# Patient Record
Sex: Male | Born: 1975 | Race: Black or African American | Hispanic: No | Marital: Single | Smoking: Current every day smoker
Health system: Southern US, Community
[De-identification: ages and names within clinical notes are randomized; demographics above are authoritative.]

## PROBLEM LIST (undated history)

## (undated) DIAGNOSIS — K922 Gastrointestinal hemorrhage, unspecified: Secondary | ICD-10-CM

## (undated) DIAGNOSIS — E119 Type 2 diabetes mellitus without complications: Secondary | ICD-10-CM

## (undated) DIAGNOSIS — I10 Essential (primary) hypertension: Secondary | ICD-10-CM

## (undated) DIAGNOSIS — J45909 Unspecified asthma, uncomplicated: Secondary | ICD-10-CM

## (undated) HISTORY — PX: OTHER SURGICAL HISTORY: SHX169

---

## 2003-09-11 ENCOUNTER — Emergency Department (HOSPITAL_COMMUNITY): Admission: EM | Admit: 2003-09-11 | Discharge: 2003-09-11 | Payer: Self-pay | Admitting: Emergency Medicine

## 2003-09-21 ENCOUNTER — Encounter: Admission: RE | Admit: 2003-09-21 | Discharge: 2003-10-15 | Payer: Self-pay | Admitting: Orthopedic Surgery

## 2004-08-30 ENCOUNTER — Emergency Department: Payer: Self-pay | Admitting: Emergency Medicine

## 2005-03-07 ENCOUNTER — Emergency Department: Payer: Self-pay | Admitting: Emergency Medicine

## 2005-05-19 ENCOUNTER — Emergency Department: Payer: Self-pay | Admitting: Emergency Medicine

## 2005-08-07 ENCOUNTER — Emergency Department: Payer: Self-pay | Admitting: Emergency Medicine

## 2005-08-10 ENCOUNTER — Emergency Department: Payer: Self-pay | Admitting: Unknown Physician Specialty

## 2006-07-19 ENCOUNTER — Emergency Department: Payer: Self-pay

## 2006-07-22 ENCOUNTER — Emergency Department: Payer: Self-pay | Admitting: Emergency Medicine

## 2010-12-08 ENCOUNTER — Emergency Department: Payer: Self-pay | Admitting: Unknown Physician Specialty

## 2010-12-19 ENCOUNTER — Emergency Department: Payer: Self-pay | Admitting: Internal Medicine

## 2011-04-09 ENCOUNTER — Emergency Department: Payer: Self-pay | Admitting: Internal Medicine

## 2011-09-24 ENCOUNTER — Emergency Department: Payer: Self-pay | Admitting: Emergency Medicine

## 2012-08-26 ENCOUNTER — Emergency Department: Payer: Self-pay | Admitting: Emergency Medicine

## 2012-11-14 ENCOUNTER — Emergency Department: Payer: Self-pay | Admitting: Emergency Medicine

## 2013-02-16 ENCOUNTER — Emergency Department: Payer: Self-pay | Admitting: Emergency Medicine

## 2014-02-10 ENCOUNTER — Emergency Department: Payer: Self-pay | Admitting: Emergency Medicine

## 2014-02-10 LAB — URINALYSIS, COMPLETE
BILIRUBIN, UR: NEGATIVE
Bacteria: NONE SEEN
Blood: NEGATIVE
Glucose,UR: NEGATIVE mg/dL (ref 0–75)
LEUKOCYTE ESTERASE: NEGATIVE
NITRITE: NEGATIVE
PROTEIN: NEGATIVE
Ph: 5 (ref 4.5–8.0)
SPECIFIC GRAVITY: 1.024 (ref 1.003–1.030)
WBC UR: 5 /HPF (ref 0–5)

## 2014-02-10 LAB — COMPREHENSIVE METABOLIC PANEL
ALBUMIN: 4.2 g/dL (ref 3.4–5.0)
ANION GAP: 8 (ref 7–16)
Alkaline Phosphatase: 112 U/L
BUN: 8 mg/dL (ref 7–18)
Bilirubin,Total: 0.3 mg/dL (ref 0.2–1.0)
CALCIUM: 8.6 mg/dL (ref 8.5–10.1)
CHLORIDE: 109 mmol/L — AB (ref 98–107)
Co2: 22 mmol/L (ref 21–32)
Creatinine: 1.11 mg/dL (ref 0.60–1.30)
EGFR (African American): >60
EGFR (Non-African Amer.): >60
Glucose: 115 mg/dL — ABNORMAL HIGH (ref 65–99)
Osmolality: 277 (ref 275–301)
Potassium: 3.6 mmol/L (ref 3.5–5.1)
SGOT(AST): 31 U/L (ref 15–37)
SGPT (ALT): 35 U/L (ref 12–78)
SODIUM: 139 mmol/L (ref 136–145)
TOTAL PROTEIN: 7.7 g/dL (ref 6.4–8.2)

## 2014-02-10 LAB — DRUG SCREEN, URINE
Amphetamines, Ur Screen: NEGATIVE (ref ?–1000)
BARBITURATES, UR SCREEN: NEGATIVE (ref ?–200)
BENZODIAZEPINE, UR SCRN: NEGATIVE (ref ?–200)
Cannabinoid 50 Ng, Ur ~~LOC~~: POSITIVE (ref ?–50)
Cocaine Metabolite,Ur ~~LOC~~: NEGATIVE (ref ?–300)
MDMA (ECSTASY) UR SCREEN: NEGATIVE (ref ?–500)
Methadone, Ur Screen: NEGATIVE (ref ?–300)
Opiate, Ur Screen: NEGATIVE (ref ?–300)
PHENCYCLIDINE (PCP) UR S: NEGATIVE (ref ?–25)
Tricyclic, Ur Screen: NEGATIVE (ref ?–1000)

## 2014-02-10 LAB — CBC
HCT: 44.2 % (ref 40.0–52.0)
HGB: 14.5 g/dL (ref 13.0–18.0)
MCH: 28.4 pg (ref 26.0–34.0)
MCHC: 32.8 g/dL (ref 32.0–36.0)
MCV: 87 fL (ref 80–100)
PLATELETS: 264 10*3/uL (ref 150–440)
RBC: 5.11 10*6/uL (ref 4.40–5.90)
RDW: 14.2 % (ref 11.5–14.5)
WBC: 12.1 10*3/uL — ABNORMAL HIGH (ref 3.8–10.6)

## 2014-02-10 LAB — ETHANOL
ETHANOL %: 0.099 % — AB (ref 0.000–0.080)
Ethanol: 99 mg/dL

## 2014-04-12 ENCOUNTER — Emergency Department: Payer: Self-pay | Admitting: Emergency Medicine

## 2014-07-25 ENCOUNTER — Emergency Department: Payer: Self-pay | Admitting: Emergency Medicine

## 2014-07-25 LAB — BASIC METABOLIC PANEL
Anion Gap: 12 (ref 7–16)
BUN: 8 mg/dL (ref 7–18)
CHLORIDE: 108 mmol/L — AB (ref 98–107)
CO2: 23 mmol/L (ref 21–32)
Calcium, Total: 8.8 mg/dL (ref 8.5–10.1)
Creatinine: 1.26 mg/dL (ref 0.60–1.30)
EGFR (African American): >60
Glucose: 76 mg/dL (ref 65–99)
OSMOLALITY: 282 (ref 275–301)
POTASSIUM: 4 mmol/L (ref 3.5–5.1)
SODIUM: 143 mmol/L (ref 136–145)

## 2014-07-25 LAB — CBC WITH DIFFERENTIAL/PLATELET
BASOS ABS: 0.1 10*3/uL (ref 0.0–0.1)
Basophil %: 0.9 %
Eosinophil #: 0.2 10*3/uL (ref 0.0–0.7)
Eosinophil %: 2.8 %
HCT: 47.4 % (ref 40.0–52.0)
HGB: 15.3 g/dL (ref 13.0–18.0)
LYMPHS ABS: 2.1 10*3/uL (ref 1.0–3.6)
LYMPHS PCT: 25.4 %
MCH: 28.2 pg (ref 26.0–34.0)
MCHC: 32.2 g/dL (ref 32.0–36.0)
MCV: 87 fL (ref 80–100)
MONOS PCT: 10.6 %
Monocyte #: 0.9 x10 3/mm (ref 0.2–1.0)
NEUTROS PCT: 60.3 %
Neutrophil #: 4.9 10*3/uL (ref 1.4–6.5)
Platelet: 256 10*3/uL (ref 150–440)
RBC: 5.43 10*6/uL (ref 4.40–5.90)
RDW: 15.2 % — ABNORMAL HIGH (ref 11.5–14.5)
WBC: 8.1 10*3/uL (ref 3.8–10.6)

## 2014-07-25 LAB — TROPONIN I: Troponin-I: <0.02 ng/mL

## 2014-11-10 ENCOUNTER — Emergency Department: Payer: Self-pay | Admitting: Emergency Medicine

## 2014-12-31 ENCOUNTER — Emergency Department: Payer: Self-pay | Admitting: Internal Medicine

## 2015-01-10 ENCOUNTER — Emergency Department: Payer: Self-pay | Admitting: Emergency Medicine

## 2015-01-11 ENCOUNTER — Emergency Department: Payer: Self-pay | Admitting: Emergency Medicine

## 2015-01-21 ENCOUNTER — Emergency Department: Payer: Self-pay | Admitting: Emergency Medicine

## 2015-01-28 ENCOUNTER — Emergency Department: Payer: Self-pay | Admitting: Emergency Medicine

## 2015-02-27 ENCOUNTER — Emergency Department
Admit: 2015-02-27 | Disposition: A | Discharge status: Home / Self Care | Payer: Self-pay | Admitting: Physician Assistant

## 2015-03-19 NOTE — Consult Note (Signed)
Brief Consult Note: Diagnosis: adjustment disorder/alcohol induced mood disorder.   Patient was seen by consultant.   Consult note dictated.   Discussed with Attending MD.   Comments: Psychiatry: PAtient seen. Chart reviewed. While drinking last night he got iddeas of suicide but did not act on it. Now denies any suicidal ideation at all and is calm and lucid. Good insight. Not commitable. Counceled him about effects of alcohol and encouraged locall MH follow up. Can be discharged from ER. Full note dictated.  Electronic Signatures: Helyn Schwan, Madie Reno (MD)  (Signed 18-Mar-15 17:28)  Authored: Brief Consult Note   Last Updated: 18-Mar-15 17:28 by Gonzella Lex (MD)

## 2015-03-19 NOTE — Consult Note (Signed)
PATIENT NAME:  LANGFORD, CARIAS MR#:  [id number] DATE OF BIRTH:  09-29-1976  DATE OF CONSULTATION:  02/10/2014  REFERRING PHYSICIAN:   CONSULTING PHYSICIAN:  Gonzella Lex, MD  IDENTIFYING INFORMATION AND REASON FOR CONSULT: A 39 year old man presented voluntarily to the Emergency Room stating that he was suicidal. Evaluation requested for appropriate psychiatric treatment.   HISTORY OF PRESENT ILLNESS: Information obtained from the patient and the chart. He came into the hospital earlier today, and at that time, he was intoxicated. He tells me that he and his girlfriend had been drinking last night and started to have an argument over something that he cannot even remember now. After the argument had been going on, his mood got worse and he started having thoughts about wanting to die or wanting to kill himself. He did not actually act on it. Does not report psychotic symptoms. Came to the hospital for treatment. He says recently his mood has been a little bit more anxious than usual. He feels like he has some stress in his life because he is getting ready to start college. His relationship with his girlfriend also may be a little bit unsteady. He denies, however, that he had been severely depressed. Denied sleeping or eating problems. Denied any psychotic symptoms. He says that he usually drinks only about 1 day a week at most and usually not as much as he had yesterday. Says that he uses marijuana occasionally but has never found it to be a problem.   PAST PSYCHIATRIC HISTORY: No previous psychiatric treatment. No history of medication treatment. No hospitalization. Denies any history of suicide attempts. No past history of substance abuse treatment.   SUBSTANCE ABUSE HISTORY: The patient claims that his drinking yesterday was atypical for him. Usually, he goes many days without drinking. Denies history of major mood problems related to his alcohol use. Denies any DTs or seizures. Denies any other  drug use besides the marijuana.   FAMILY HISTORY: No known family history of mental illness.   SOCIAL HISTORY: The patient lives with his family of origin. He is not married and has no children. He is involved with a girlfriend. The relationship sounds a little bit rocky but not pathological. He has been working recently but is going to start college. From what I can tell, it sounds like possibly an online school.   MEDICAL HISTORY: The patient is moderately overweight but does not appear to be in particularly bad health. He denies any significant ongoing medical problems. He is not prescribed any medications.    ALLERGIES: No known drug allergies.   REVIEW OF SYSTEMS: Currently says he feels embarrassed about what he expressed yesterday. No longer feels depressed. Denies suicidal ideation. Denies hallucinations. Denies any delusional thinking. Feeling physically better. No longer sick to his stomach. No longer feeling weak or tired. The rest of the full review of systems is negative.   MENTAL STATUS EXAM: Neatly dressed and groomed man, looked his stated age. Cooperative with the interview. Good eye contact. Normal psychomotor activity. No sign of tremor. Speech normal rate, tone and volume. Affect a little bit embarrassed and ashamed but reactive. Mood stated as being okay. Thoughts are lucid without loosening of associations or delusions. Denies auditory or visual hallucinations. Denies suicidal or homicidal ideation. Judgment and insight adequate. Intelligence normal. Short- and long-term memory grossly intact.   LABORATORY RESULTS: Alcohol level was 99. Glucose slightly elevated 115. CBC slightly elevated white count of 12.1. Urinalysis normal. Drug screen positive  for cannabis.   ASSESSMENT: A 39 year old man who presented somewhat intoxicated, having had an argument with his girlfriend, having suicidal thoughts that he did not act on. Now sobered up, he no longer has any suicidal thoughts.  Multiple positive things in his life he is looking forward to. Does not report what sounds like a depression but does seem to probably have some trouble with overreactivity or mood. The patient does not require inpatient hospitalization. He is not committable. The patient has been counseled about the treatment available, including therapy for his emotional instability. He will be encouraged to go to referral at a local outpatient clinic. Otherwise, his commitment can be discontinued and he can be released.   DIAGNOSIS, PRINCIPAL AND PRIMARY:  AXIS I: Adjustment disorder with depression.   SECONDARY DIAGNOSES:  AXIS I: Substance-induced mood disorder, alcohol.  AXIS II: Deferred.  AXIS III: Overweight. No other diagnosis.  AXIS IV: Severe from feeling a lot of stress in his life.  AXIS V: Functioning at time of evaluation 74.    ____________________________ Gonzella Lex, MD jtc:gb D: 02/11/2014 00:19:05 ET T: 02/11/2014 02:53:50 ET JOB#: [id number]  cc: Gonzella Lex, MD, <Dictator> Gonzella Lex MD ELECTRONICALLY SIGNED 02/12/2014 23:42

## 2015-05-07 ENCOUNTER — Encounter: Payer: Self-pay | Admitting: Emergency Medicine

## 2015-05-07 ENCOUNTER — Emergency Department
Admission: EM | Admit: 2015-05-07 | Discharge: 2015-05-07 | Disposition: A | Discharge status: Home / Self Care | Payer: Self-pay | Attending: Emergency Medicine | Admitting: Emergency Medicine

## 2015-05-07 DIAGNOSIS — K0889 Other specified disorders of teeth and supporting structures: Secondary | ICD-10-CM

## 2015-05-07 DIAGNOSIS — Z720 Tobacco use: Secondary | ICD-10-CM | POA: Insufficient documentation

## 2015-05-07 DIAGNOSIS — Z792 Long term (current) use of antibiotics: Secondary | ICD-10-CM | POA: Insufficient documentation

## 2015-05-07 DIAGNOSIS — K029 Dental caries, unspecified: Secondary | ICD-10-CM | POA: Insufficient documentation

## 2015-05-07 HISTORY — DX: Unspecified asthma, uncomplicated: J45.909

## 2015-05-07 MED ORDER — PENICILLIN V POTASSIUM 500 MG PO TABS
500.0000 mg | ORAL_TABLET | Freq: Four times a day (QID) | ORAL | Status: DC
Start: 2015-05-07 — End: 2016-02-06

## 2015-05-07 MED ORDER — LIDOCAINE HCL (PF) 1 % IJ SOLN
INTRAMUSCULAR | Status: AC
  Filled 2015-05-07: qty 5

## 2015-05-07 MED ORDER — TRAMADOL HCL 50 MG PO TABS
ORAL_TABLET | ORAL | Status: AC
  Administered 2015-05-07: 50 mg via ORAL
  Filled 2015-05-07: qty 1

## 2015-05-07 MED ORDER — BUPIVACAINE HCL (PF) 0.5 % IJ SOLN
INTRAMUSCULAR | Status: AC
  Filled 2015-05-07: qty 30

## 2015-05-07 MED ORDER — TRAMADOL HCL 50 MG PO TABS
50.0000 mg | ORAL_TABLET | Freq: Once | ORAL | Status: AC
  Administered 2015-05-07: 50 mg via ORAL

## 2015-05-07 MED ORDER — TRAMADOL HCL 50 MG PO TABS: 50.0000 mg | ORAL_TABLET | Freq: Four times a day (QID) | ORAL | Status: DC | PRN

## 2015-05-07 NOTE — ED Notes (Signed)
Pt. Has chipped lower molar on bottom left side of mouth.  Pt. Just wants medication to control pain until he can see a dentist this week.

## 2015-05-07 NOTE — Discharge Instructions (Signed)
Dental Caries °Dental caries (also called tooth decay) is the most common oral disease. It can occur at any age but is more common in children and young adults.  °HOW DENTAL CARIES DEVELOPS  °The process of decay begins when bacteria and foods (particularly sugars and starches) combine in your mouth to produce plaque. Plaque is a substance that sticks to the hard, outer surface of a tooth (enamel). The bacteria in plaque produce acids that attack enamel. These acids may also attack the root surface of a tooth (cementum) if it is exposed. Repeated attacks dissolve these surfaces and create holes in the tooth (cavities). If left untreated, the acids destroy the other layers of the tooth.  °RISK FACTORS °· Frequent sipping of sugary beverages.   °· Frequent snacking on sugary and starchy foods, especially those that easily get stuck in the teeth.   °· Poor oral hygiene.   °· Dry mouth.   °· Substance abuse such as methamphetamine abuse.   °· Broken or poor-fitting dental restorations.   °· Eating disorders.   °· Gastroesophageal reflux disease (GERD).   °· Certain radiation treatments to the head and neck. °SYMPTOMS °In the early stages of dental caries, symptoms are seldom present. Sometimes white, chalky areas may be seen on the enamel or other tooth layers. In later stages, symptoms may include: °· Pits and holes on the enamel. °· Toothache after sweet, hot, or cold foods or drinks are consumed. °· Pain around the tooth. °· Swelling around the tooth. °DIAGNOSIS  °Most of the time, dental caries is detected during a regular dental checkup. A diagnosis is made after a thorough medical and dental history is taken and the surfaces of your teeth are checked for signs of dental caries. Sometimes special instruments, such as lasers, are used to check for dental caries. Dental X-ray exams may be taken so that areas not visible to the eye (such as between the contact areas of the teeth) can be checked for cavities.    °TREATMENT  °If dental caries is in its early stages, it may be reversed with a fluoride treatment or an application of a remineralizing agent at the dental office. Thorough brushing and flossing at home is needed to aid these treatments. If it is in its later stages, treatment depends on the location and extent of tooth destruction:  °· If a small area of the tooth has been destroyed, the destroyed area will be removed and cavities will be filled with a material such as gold, silver amalgam, or composite resin.   °· If a large area of the tooth has been destroyed, the destroyed area will be removed and a cap (crown) will be fitted over the remaining tooth structure.   °· If the center part of the tooth (pulp) is affected, a procedure called a root canal will be needed before a filling or crown can be placed.   °· If most of the tooth has been destroyed, the tooth may need to be pulled (extracted). °HOME CARE INSTRUCTIONS °You can prevent, stop, or reverse dental caries at home by practicing good oral hygiene. Good oral hygiene includes: °· Thoroughly cleaning your teeth at least twice a day with a toothbrush and dental floss.   °· Using a fluoride toothpaste. A fluoride mouth rinse may also be used if recommended by your dentist or health care provider.   °· Restricting the amount of sugary and starchy foods and sugary liquids you consume.   °· Avoiding frequent snacking on these foods and sipping of these liquids.   °· Keeping regular visits with   a dentist for checkups and cleanings. PREVENTION   Practice good oral hygiene.  Consider a dental sealant. A dental sealant is a coating material that is applied by your dentist to the pits and grooves of teeth. The sealant prevents food from being trapped in them. It may protect the teeth for several years.  Ask about fluoride supplements if you live in a community without fluorinated water or with water that has a low fluoride content. Use fluoride supplements  as directed by your dentist or health care provider.  Allow fluoride varnish applications to teeth if directed by your dentist or health care provider. Document Released: 08/04/2002 Document Revised: 03/29/2014 Document Reviewed: 11/14/2012 [facility] Patient Information 2015 [place], [place]. This information is not intended to replace advice given to you by your health care provider. Make sure you discuss any questions you have with your health care provider.  Dental Pain A tooth ache may be caused by cavities (tooth decay). Cavities expose the nerve of the tooth to air and hot or cold temperatures. It may come from an infection or abscess (also called a boil or furuncle) around your tooth. It is also often caused by dental caries (tooth decay). This causes the pain you are having. DIAGNOSIS  Your caregiver can diagnose this problem by exam. TREATMENT   If caused by an infection, it may be treated with medications which kill germs (antibiotics) and pain medications as prescribed by your caregiver. Take medications as directed.  Only take over-the-counter or prescription medicines for pain, discomfort, or fever as directed by your caregiver.  Whether the tooth ache today is caused by infection or dental disease, you should see your dentist as soon as possible for further care. SEEK MEDICAL CARE IF: The exam and treatment you received today has been provided on an emergency basis only. This is not a substitute for complete medical or dental care. If your problem worsens or new problems (symptoms) appear, and you are unable to meet with your dentist, call or return to this location. SEEK IMMEDIATE MEDICAL CARE IF:   You have a fever.  You develop redness and swelling of your face, jaw, or neck.  You are unable to open your mouth.  You have severe pain uncontrolled by pain medicine. MAKE SURE YOU:   Understand these instructions.  Will watch your condition.  Will get help right away if  you are not doing well or get worse. Document Released: 11/12/2005 Document Revised: 02/04/2012 Document Reviewed: 06/30/2008 [facility] Patient Information 2015 [place], [place]. This information is not intended to replace advice given to you by your health care provider. Make sure you discuss any questions you have with your health care provider.

## 2015-05-07 NOTE — ED Provider Notes (Signed)
CSN: [id number]     Arrival date & time 05/07/15  1905 History   First MD Initiated Contact with Patient 05/07/15 1952     Chief Complaint  Patient presents with  . Dental Pain    Pt. states toothach for the past month, worse in the last 24 hours.  Pt. states starting amoxician about a month ago but did not finish taking medication.  Pt. states ibuprohen not controlling pain.  Dental pain in located on bottom right side. Pt. states "I just want some stronger medication"     (Consider location/radiation/quality/duration/timing/severity/associated sxs/prior Treatment) HPI  39 year old male presents to the emergency department for evaluation of toothache pain has been present for 2 days. No trauma or injury. He has a history of dental caries. Dental pain is at tooth #20. Patient has tried amoxicillin and ibuprofen with no relief. Pain is currently moderate to severe. Pain is described as sharp No fevers. He is able tolerate by mouth well.  Past Medical History  Diagnosis Date  . Asthma    No past surgical history on file. Family History  Problem Relation Age of Onset  . Diabetes Mother   . Cancer Father    History  Substance Use Topics  . Smoking status: Current Every Day Smoker -- 1.00 packs/day for 5 years    Types: Cigarettes  . Smokeless tobacco: Not on file  . Alcohol Use: 4.2 oz/week    5 Shots of liquor, 2 Cans of beer per week    Review of Systems  Constitutional: Negative.  Negative for fever and chills.  HENT: Positive for dental problem. Negative for drooling, facial swelling, mouth sores, trouble swallowing and voice change.   Respiratory: Negative for chest tightness and shortness of breath.   Cardiovascular: Negative for chest pain.  Gastrointestinal: Negative for nausea, vomiting, abdominal pain and diarrhea.  Musculoskeletal: Negative for arthralgias, neck pain and neck stiffness.  Skin: Negative.   Psychiatric/Behavioral: Negative for confusion.  All other  systems reviewed and are negative.     Allergies  Review of patient's allergies indicates not on file.  Home Medications   Prior to Admission medications   Medication Sig Start Date End Date Taking? Authorizing Provider  amoxicillin (AMOXIL) 500 MG capsule Take 500 mg by mouth 3 (three) times daily.   Yes Historical Provider, MD  penicillin v potassium (VEETID) 500 MG tablet Take 1 tablet (500 mg total) by mouth 4 (four) times daily. 05/07/15   Duanne Guess, PA-C  traMADol (ULTRAM) 50 MG tablet Take 1 tablet (50 mg total) by mouth every 6 (six) hours as needed. 05/07/15   Duanne Guess, PA-C   BP 126/74 mmHg  Pulse 101  Temp(Src) 98.9 F (37.2 C)  Ht 5\' 9"  (1.753 m)  Wt 270 lb (122.471 kg)  BMI 39.85 kg/m2  SpO2 96% Physical Exam  Constitutional: He is oriented to person, place, and time. He appears well-developed and well-nourished. No distress.  HENT:  Head: Normocephalic and atraumatic.  Right Ear: External ear normal.  Left Ear: External ear normal.  Nose: Nose normal.  Mouth/Throat: Uvula is midline and oropharynx is clear and moist. No oral lesions. No trismus in the jaw. Normal dentition. Dental caries present. No dental abscesses or uvula swelling.    Eyes: EOM are normal.  Neck: Normal range of motion. Neck supple.  Cardiovascular: Normal rate.   Pulmonary/Chest: Effort normal. No respiratory distress.  Neurological: He is alert and oriented to person, place, and time.  Skin:  Skin is warm and dry.  Psychiatric: He has a normal mood and affect. His behavior is normal. Thought content normal.    ED Course  Procedures (including critical care time) Left mandibular nerve block: Patient was offered, agreed and consented to a left mandibular nerve block for left lower tooth pain. A 10 cc syringe was filled with 5 cc of 1% lidocaine,5 cc of 0.5% bupivacaine. Solution was injected as a bolus solution around the left mandibular nerve. Patient had complete pain relief  after the injection.   Labs Review Labs Reviewed - No data to display  Imaging Review No results found.   EKG Interpretation None      MDM   Final diagnoses:  Pain, dental  Dental caries    1. Follow-up with dentist or [facility] dental clinic 2. Penicillin VK 3. Tramadol and ibuprofen 4. Left mandibular nerve block 5. Return to the ER for any worsening symptoms or changes    Duanne Guess, PA-C 05/07/15 2016  Hinda Kehr, MD 05/08/15 [phone]

## 2015-05-08 ENCOUNTER — Emergency Department (HOSPITAL_COMMUNITY)
Admission: EM | Admit: 2015-05-08 | Discharge: 2015-05-08 | Disposition: A | Discharge status: Home / Self Care | Payer: Self-pay | Attending: Emergency Medicine | Admitting: Emergency Medicine

## 2015-05-08 ENCOUNTER — Encounter (HOSPITAL_COMMUNITY): Payer: Self-pay | Admitting: Emergency Medicine

## 2015-05-08 DIAGNOSIS — Z792 Long term (current) use of antibiotics: Secondary | ICD-10-CM | POA: Insufficient documentation

## 2015-05-08 DIAGNOSIS — K029 Dental caries, unspecified: Secondary | ICD-10-CM | POA: Insufficient documentation

## 2015-05-08 DIAGNOSIS — Z720 Tobacco use: Secondary | ICD-10-CM | POA: Insufficient documentation

## 2015-05-08 DIAGNOSIS — J45909 Unspecified asthma, uncomplicated: Secondary | ICD-10-CM | POA: Insufficient documentation

## 2015-05-08 MED ORDER — HYDROCODONE-ACETAMINOPHEN 5-325 MG PO TABS
1.0000 | ORAL_TABLET | Freq: Once | ORAL | Status: AC
  Administered 2015-05-08: 1 via ORAL
  Filled 2015-05-08: qty 1

## 2015-05-08 MED ORDER — TRAMADOL HCL 50 MG PO TABS: 50.0000 mg | ORAL_TABLET | Freq: Once | ORAL | Status: DC

## 2015-05-08 NOTE — ED Notes (Signed)
Patient here with complaint of dental pain on lower left side. Was seen at [facility] with dental block placed. States that he was told the block would last 12-16 hours, but the pain has returned and is worse at this time.

## 2015-05-08 NOTE — ED Provider Notes (Signed)
CSN: [id number]     Arrival date & time 05/08/15  0022 History  This chart was scribed for non-physician practitioner, Garald Balding, NP, working with Francine Graven, DO, by Peyton Bottoms ED Scribe. This patient was seen in room A06C/A06C and the patient's care was started at 12:39 AM    Chief Complaint  Patient presents with  . Dental Pain   Patient is a 39 y.o. male presenting with tooth pain. The history is provided by the patient. No language interpreter was used.  Dental Pain Location:  Lower Lower teeth location:  31/RL 2nd molar Quality:  Pulsating Severity:  Moderate Onset quality:  Unable to specify Timing:  Constant Progression:  Unchanged Chronicity:  New Context: dental caries   Relieved by:  Dental block Ineffective treatments:  None tried Associated symptoms: no facial swelling, no fever, no neck pain and no trismus     HPI Comments: Logan Lucas is a 39 y.o. male who presents to the Emergency Department complaining of gradually worsening dental pain onset yesterday. Patient was seen at 19:52 yesterday at [facility] and was placed left mandibular nerve block for left lower dental pain. Patient was given tramadol yesterday. He has not filled his prescription for his tramadol medication.  Past Medical History  Diagnosis Date  . Asthma    History reviewed. No pertinent past surgical history. Family History  Problem Relation Age of Onset  . Diabetes Mother   . Cancer Father    History  Substance Use Topics  . Smoking status: Current Every Day Smoker -- 1.00 packs/day for 5 years    Types: Cigarettes  . Smokeless tobacco: Not on file  . Alcohol Use: 4.2 oz/week    5 Shots of liquor, 2 Cans of beer per week   Review of Systems  Constitutional: Negative for fever.  HENT: Positive for dental problem. Negative for facial swelling and trouble swallowing.   Gastrointestinal: Negative for nausea.  Musculoskeletal: Negative for neck pain.  All other  systems reviewed and are negative.  Allergies  Review of patient's allergies indicates no known allergies.  Home Medications   Prior to Admission medications   Medication Sig Start Date End Date Taking? Authorizing Provider  amoxicillin (AMOXIL) 500 MG capsule Take 500 mg by mouth 3 (three) times daily.    Historical Provider, MD  penicillin v potassium (VEETID) 500 MG tablet Take 1 tablet (500 mg total) by mouth 4 (four) times daily. 05/07/15   Duanne Guess, PA-C  traMADol (ULTRAM) 50 MG tablet Take 1 tablet (50 mg total) by mouth every 6 (six) hours as needed. 05/07/15   Duanne Guess, PA-C   Triage Vitals: BP 144/79 mmHg  Pulse 109  Temp(Src) 98 F (36.7 C) (Oral)  Resp 16  Ht 5\' 8"  (1.727 m)  Wt 260 lb (117.935 kg)  BMI 39.54 kg/m2  SpO2 96%  Physical Exam  Constitutional: He is oriented to person, place, and time. He appears well-developed and well-nourished. No distress.  HENT:  Head: Normocephalic and atraumatic.  Eyes: Conjunctivae and EOM are normal.  Neck: Neck supple. No tracheal deviation present.  Cardiovascular: Normal rate.   Pulmonary/Chest: Effort normal. No respiratory distress.  Musculoskeletal: Normal range of motion.  Neurological: He is alert and oriented to person, place, and time.  Skin: Skin is warm and dry.  Psychiatric: He has a normal mood and affect. His behavior is normal.  Nursing note and vitals reviewed.   ED Course  Procedures (including critical  care time)  DIAGNOSTIC STUDIES: Oxygen Saturation is 96% on RA, normal by my interpretation.    COORDINATION OF CARE: 12:43 AM- Discussed plans to give patient tramadol. Will discharge patient. Pt advised of plan for treatment and pt agrees.   Labs Review Labs Reviewed - No data to display  Imaging Review No results found.   EKG Interpretation None    will give Hydrocodone in the ED suggest fill the Tramadol and gave DDS referral to call on Monday AM  MDM   Final diagnoses:   None    I personally performed the services described in this documentation, which was scribed in my presence. The recorded information has been reviewed and is accurate.  Junius Creamer, NP 05/08/15 [place], DO 05/11/15 1430

## 2015-05-08 NOTE — ED Notes (Signed)
Pt. Left with all belongings and refused wheelchair 

## 2015-05-08 NOTE — Discharge Instructions (Signed)
Dental Care and Dentist Visits Dental care supports good overall health. Regular dental visits can also help you avoid dental pain, bleeding, infection, and other more serious health problems in the future. It is important to keep the mouth healthy because diseases in the teeth, gums, and other oral tissues can spread to other areas of the body. Some problems, such as diabetes, heart disease, and pre-term labor have been associated with poor oral health.  See your dentist every 6 months. If you experience emergency problems such as a toothache or broken tooth, go to the dentist right away. If you see your dentist regularly, you may catch problems early. It is easier to be treated for problems in the early stages.  WHAT TO EXPECT AT A DENTIST VISIT  Your dentist will look for many common oral health problems and recommend proper treatment. At your regular dental visit, you can expect:  Gentle cleaning of the teeth and gums. This includes scraping and polishing. This helps to remove the sticky substance around the teeth and gums (plaque). Plaque forms in the mouth shortly after eating. Over time, plaque hardens on the teeth as tartar. If tartar is not removed regularly, it can cause problems. Cleaning also helps remove stains.  Periodic X-rays. These pictures of the teeth and supporting bone will help your dentist assess the health of your teeth.  Periodic fluoride treatments. Fluoride is a natural mineral shown to help strengthen teeth. Fluoride treatmentinvolves applying a fluoride gel or varnish to the teeth. It is most commonly done in children.  Examination of the mouth, tongue, jaws, teeth, and gums to look for any oral health problems, such as:  Cavities (dental caries). This is decay on the tooth caused by plaque, sugar, and acid in the mouth. It is best to catch a cavity when it is small.  Inflammation of the gums caused by plaque buildup (gingivitis).  Problems with the mouth or malformed  or misaligned teeth.  Oral cancer or other diseases of the soft tissues or jaws. KEEP YOUR TEETH AND GUMS HEALTHY For healthy teeth and gums, follow these general guidelines as well as your dentist's specific advice:  Have your teeth professionally cleaned at the dentist every 6 months.  Brush twice daily with a fluoride toothpaste.  Floss your teeth daily.  Ask your dentist if you need fluoride supplements, treatments, or fluoride toothpaste.  Eat a healthy diet. Reduce foods and drinks with added sugar.  Avoid smoking. TREATMENT FOR ORAL HEALTH PROBLEMS If you have oral health problems, treatment varies depending on the conditions present in your teeth and gums.  Your caregiver will most likely recommend good oral hygiene at each visit.  For cavities, gingivitis, or other oral health disease, your caregiver will perform a procedure to treat the problem. This is typically done at a separate appointment. Sometimes your caregiver will refer you to another dental specialist for specific tooth problems or for surgery. SEEK IMMEDIATE DENTAL CARE IF:  You have pain, bleeding, or soreness in the gum, tooth, jaw, or mouth area.  A permanent tooth becomes loose or separated from the gum socket.  You experience a blow or injury to the mouth or jaw area. Document Released: 07/25/2011 Document Revised: 02/04/2012 Document Reviewed: 07/25/2011 [facility] Patient Information 2015 [place], [place]. This information is not intended to replace advice given to you by your health care provider. Make sure you discuss any questions you have with your health care provider.  Dental Caries Dental caries (also called tooth decay)  is the most common oral disease. It can occur at any age but is more common in children and young adults.  HOW DENTAL CARIES DEVELOPS  The process of decay begins when bacteria and foods (particularly sugars and starches) combine in your mouth to produce plaque. Plaque is a  substance that sticks to the hard, outer surface of a tooth (enamel). The bacteria in plaque produce acids that attack enamel. These acids may also attack the root surface of a tooth (cementum) if it is exposed. Repeated attacks dissolve these surfaces and create holes in the tooth (cavities). If left untreated, the acids destroy the other layers of the tooth.  RISK FACTORS  Frequent sipping of sugary beverages.   Frequent snacking on sugary and starchy foods, especially those that easily get stuck in the teeth.   Poor oral hygiene.   Dry mouth.   Substance abuse such as methamphetamine abuse.   Broken or poor-fitting dental restorations.   Eating disorders.   Gastroesophageal reflux disease (GERD).   Certain radiation treatments to the head and neck. SYMPTOMS In the early stages of dental caries, symptoms are seldom present. Sometimes white, chalky areas may be seen on the enamel or other tooth layers. In later stages, symptoms may include:  Pits and holes on the enamel.  Toothache after sweet, hot, or cold foods or drinks are consumed.  Pain around the tooth.  Swelling around the tooth. DIAGNOSIS  Most of the time, dental caries is detected during a regular dental checkup. A diagnosis is made after a thorough medical and dental history is taken and the surfaces of your teeth are checked for signs of dental caries. Sometimes special instruments, such as lasers, are used to check for dental caries. Dental X-ray exams may be taken so that areas not visible to the eye (such as between the contact areas of the teeth) can be checked for cavities.  TREATMENT  If dental caries is in its early stages, it may be reversed with a fluoride treatment or an application of a remineralizing agent at the dental office. Thorough brushing and flossing at home is needed to aid these treatments. If it is in its later stages, treatment depends on the location and extent of tooth destruction:    If a small area of the tooth has been destroyed, the destroyed area will be removed and cavities will be filled with a material such as gold, silver amalgam, or composite resin.   If a large area of the tooth has been destroyed, the destroyed area will be removed and a cap (crown) will be fitted over the remaining tooth structure.   If the center part of the tooth (pulp) is affected, a procedure called a root canal will be needed before a filling or crown can be placed.   If most of the tooth has been destroyed, the tooth may need to be pulled (extracted). HOME CARE INSTRUCTIONS You can prevent, stop, or reverse dental caries at home by practicing good oral hygiene. Good oral hygiene includes:  Thoroughly cleaning your teeth at least twice a day with a toothbrush and dental floss.   Using a fluoride toothpaste. A fluoride mouth rinse may also be used if recommended by your dentist or health care provider.   Restricting the amount of sugary and starchy foods and sugary liquids you consume.   Avoiding frequent snacking on these foods and sipping of these liquids.   Keeping regular visits with a dentist for checkups and cleanings. PREVENTION  Practice good oral hygiene.  Consider a dental sealant. A dental sealant is a coating material that is applied by your dentist to the pits and grooves of teeth. The sealant prevents food from being trapped in them. It may protect the teeth for several years.  Ask about fluoride supplements if you live in a community without fluorinated water or with water that has a low fluoride content. Use fluoride supplements as directed by your dentist or health care provider.  Allow fluoride varnish applications to teeth if directed by your dentist or health care provider. Document Released: 08/04/2002 Document Revised: 03/29/2014 Document Reviewed: 11/14/2012 [facility] Patient Information 2015 [place], [place]. This information is not intended to  replace advice given to you by your health care provider. Make sure you discuss any questions you have with your health care provider. Please take the Penicillin as directed fill the Ultram prescription  Call Dr. Radford Pax in the morning to set an appointment to be seen in the next several days

## 2015-12-12 ENCOUNTER — Emergency Department
Admission: EM | Admit: 2015-12-12 | Discharge: 2015-12-12 | Disposition: A | Discharge status: Home / Self Care | Payer: Self-pay | Attending: Emergency Medicine | Admitting: Emergency Medicine

## 2015-12-12 ENCOUNTER — Encounter: Payer: Self-pay | Admitting: Emergency Medicine

## 2015-12-12 ENCOUNTER — Emergency Department: Payer: Self-pay

## 2015-12-12 DIAGNOSIS — Y9289 Other specified places as the place of occurrence of the external cause: Secondary | ICD-10-CM | POA: Insufficient documentation

## 2015-12-12 DIAGNOSIS — S300XXA Contusion of lower back and pelvis, initial encounter: Secondary | ICD-10-CM | POA: Insufficient documentation

## 2015-12-12 DIAGNOSIS — F17210 Nicotine dependence, cigarettes, uncomplicated: Secondary | ICD-10-CM | POA: Insufficient documentation

## 2015-12-12 DIAGNOSIS — Y998 Other external cause status: Secondary | ICD-10-CM | POA: Insufficient documentation

## 2015-12-12 DIAGNOSIS — Y9389 Activity, other specified: Secondary | ICD-10-CM | POA: Insufficient documentation

## 2015-12-12 DIAGNOSIS — Z792 Long term (current) use of antibiotics: Secondary | ICD-10-CM | POA: Insufficient documentation

## 2015-12-12 MED ORDER — HYDROCODONE-ACETAMINOPHEN 5-325 MG PO TABS: 1.0000 | ORAL_TABLET | ORAL | Status: DC | PRN

## 2015-12-12 MED ORDER — IBUPROFEN 800 MG PO TABS: 800.0000 mg | ORAL_TABLET | Freq: Three times a day (TID) | ORAL | Status: DC | PRN

## 2015-12-12 MED ORDER — KETOROLAC TROMETHAMINE 60 MG/2ML IM SOLN
60.0000 mg | Freq: Once | INTRAMUSCULAR | Status: AC
  Administered 2015-12-12: 60 mg via INTRAMUSCULAR
  Filled 2015-12-12: qty 2
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
## 2015-12-12 NOTE — Discharge Instructions (Signed)
Tailbone Injury °The tailbone (coccyx) is the small bone at the lower end of the spine. A tailbone injury may involve stretched ligaments, bruising, or a broken bone (fracture). Tailbone injuries can be painful, and some may take a long time to heal. °CAUSES °This condition is often caused by falling and landing on the tailbone. Other causes include: °· Repeated strain or friction from actions such as rowing and bicycling. °· Childbirth. °In some cases, the cause may not be known. °RISK FACTORS °This condition is more common in women than in men. °SYMPTOMS °Symptoms of this condition include: °· Pain in the lower back, especially when sitting. °· Pain or difficulty when standing up from a sitting position. °· Bruising in the tailbone area. °· Painful bowel movements. °· In women, pain during intercourse. °DIAGNOSIS °This condition may be diagnosed based on your symptoms and a physical exam. X-rays may be taken if a fracture is suspected. You may also have other tests, such as a CT scan or MRI. °TREATMENT °This condition may be treated with medicines to help relieve your pain. Most tailbone injuries heal on their own in 4-6 weeks. However, recovery time may be longer if the injury involves a fracture. °HOME CARE INSTRUCTIONS °· Take medicines only as directed by your health care provider. °· If directed, apply ice to the injured area: °¨ Put ice in a plastic bag. °¨ Place a towel between your skin and the bag. °¨ Leave the ice on for 20 minutes, 2-3 times per day for the first 1-2 days. °· Sit on a large, rubber or inflated ring or cushion to ease your pain. Lean forward when you are sitting to help decrease discomfort. °· Avoid sitting for long periods of time. °· Increase your activity as the pain allows. Perform any exercises that are recommended by your health care provider or physical therapist. °· If you have pain during bowel movements, use stool softeners as directed by your health care provider. °· Eat a  diet that includes plenty of fiber to help prevent constipation. °· Keep all follow-up visits as directed by your health care provider. This is important. °PREVENTION °Wear appropriate padding and sports gear when bicycling and rowing. This can help to prevent developing an injury that is caused by repeated strain or friction. °SEEK MEDICAL CARE IF: °· Your pain becomes worse. °· Your bowel movements cause a great deal of discomfort. °· You are unable to have a bowel movement. °· You have uncontrolled urine loss (urinary incontinence). °· You have a fever. °  °This information is not intended to replace advice given to you by your health care provider. Make sure you discuss any questions you have with your health care provider. °  °Document Released: 11/09/2000 Document Revised: 03/29/2015 Document Reviewed: 11/08/2014 °Elsevier Interactive Patient Education ©2016 [employer] ° °
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
## 2015-12-16 ENCOUNTER — Emergency Department
Admission: EM | Admit: 2015-12-16 | Discharge: 2015-12-16 | Disposition: A | Discharge status: Home / Self Care | Payer: Self-pay | Attending: Emergency Medicine | Admitting: Emergency Medicine

## 2015-12-16 DIAGNOSIS — M62838 Other muscle spasm: Secondary | ICD-10-CM

## 2015-12-16 DIAGNOSIS — Y9389 Activity, other specified: Secondary | ICD-10-CM | POA: Insufficient documentation

## 2015-12-16 DIAGNOSIS — S79912A Unspecified injury of left hip, initial encounter: Secondary | ICD-10-CM | POA: Insufficient documentation

## 2015-12-16 DIAGNOSIS — Y998 Other external cause status: Secondary | ICD-10-CM | POA: Insufficient documentation

## 2015-12-16 DIAGNOSIS — M62830 Muscle spasm of back: Secondary | ICD-10-CM | POA: Insufficient documentation

## 2015-12-16 DIAGNOSIS — Y92481 Parking lot as the place of occurrence of the external cause: Secondary | ICD-10-CM | POA: Insufficient documentation

## 2015-12-16 DIAGNOSIS — F17210 Nicotine dependence, cigarettes, uncomplicated: Secondary | ICD-10-CM | POA: Insufficient documentation

## 2015-12-16 DIAGNOSIS — S79922A Unspecified injury of left thigh, initial encounter: Secondary | ICD-10-CM | POA: Insufficient documentation

## 2015-12-16 DIAGNOSIS — Z792 Long term (current) use of antibiotics: Secondary | ICD-10-CM | POA: Insufficient documentation

## 2015-12-16 DIAGNOSIS — S300XXA Contusion of lower back and pelvis, initial encounter: Secondary | ICD-10-CM | POA: Insufficient documentation

## 2015-12-16 MED ORDER — DIAZEPAM 5 MG PO TABS
5.0000 mg | ORAL_TABLET | Freq: Once | ORAL | Status: AC
  Administered 2015-12-16: 5 mg via ORAL
  Filled 2015-12-16: qty 1

## 2015-12-16 MED ORDER — KETOROLAC TROMETHAMINE 60 MG/2ML IM SOLN
60.0000 mg | Freq: Once | INTRAMUSCULAR | Status: AC
  Administered 2015-12-16: 60 mg via INTRAMUSCULAR
  Filled 2015-12-16: qty 2

## 2015-12-16 MED ORDER — DOCUSATE SODIUM 100 MG PO CAPS: 100.0000 mg | ORAL_CAPSULE | Freq: Every day | ORAL | Status: AC | PRN

## 2015-12-16 MED ORDER — CARISOPRODOL 350 MG PO TABS: 350.0000 mg | ORAL_TABLET | Freq: Three times a day (TID) | ORAL | Status: DC | PRN

## 2015-12-16 MED ORDER — DIAZEPAM 5 MG PO TABS
ORAL_TABLET | ORAL | Status: AC
  Filled 2015-12-16: qty 1
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
## 2015-12-16 NOTE — ED Notes (Signed)
Lower back pain since fall. Pt alert and oriented X4, active, cooperative, pt in NAD. RR even and unlabored, color WNL.

## 2015-12-16 NOTE — ED Notes (Signed)
Pt alert and oriented X4, active, cooperative, pt in NAD. RR even and unlabored, color WNL.  Pt informed to return if any life threatening symptoms occur.   
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
## 2015-12-16 NOTE — ED Notes (Signed)
Report from Elizabeth, RN

## 2016-02-06 ENCOUNTER — Encounter: Payer: Self-pay | Admitting: Emergency Medicine

## 2016-02-06 ENCOUNTER — Emergency Department
Admission: EM | Admit: 2016-02-06 | Discharge: 2016-02-06 | Disposition: A | Discharge status: Home / Self Care | Payer: Self-pay | Attending: Emergency Medicine | Admitting: Emergency Medicine

## 2016-02-06 DIAGNOSIS — R109 Unspecified abdominal pain: Secondary | ICD-10-CM | POA: Insufficient documentation

## 2016-02-06 DIAGNOSIS — F419 Anxiety disorder, unspecified: Secondary | ICD-10-CM | POA: Insufficient documentation

## 2016-02-06 DIAGNOSIS — F17210 Nicotine dependence, cigarettes, uncomplicated: Secondary | ICD-10-CM | POA: Insufficient documentation

## 2016-02-06 DIAGNOSIS — J45901 Unspecified asthma with (acute) exacerbation: Secondary | ICD-10-CM | POA: Insufficient documentation

## 2016-02-06 LAB — COMPREHENSIVE METABOLIC PANEL
ALT: 39 U/L (ref 17–63)
AST: 44 U/L — ABNORMAL HIGH (ref 15–41)
Albumin: 4.1 g/dL (ref 3.5–5.0)
Alkaline Phosphatase: 105 U/L (ref 38–126)
Anion gap: 12 (ref 5–15)
BILIRUBIN TOTAL: 1.1 mg/dL (ref 0.3–1.2)
BUN: 9 mg/dL (ref 6–20)
CHLORIDE: 101 mmol/L (ref 101–111)
CO2: 21 mmol/L — ABNORMAL LOW (ref 22–32)
Calcium: 9.3 mg/dL (ref 8.9–10.3)
Creatinine, Ser: 0.91 mg/dL (ref 0.61–1.24)
Glucose, Bld: 126 mg/dL — ABNORMAL HIGH (ref 65–99)
POTASSIUM: 4 mmol/L (ref 3.5–5.1)
Sodium: 134 mmol/L — ABNORMAL LOW (ref 135–145)
TOTAL PROTEIN: 7.4 g/dL (ref 6.5–8.1)

## 2016-02-06 LAB — CBC
HEMATOCRIT: 50.1 % (ref 40.0–52.0)
Hemoglobin: 16.9 g/dL (ref 13.0–18.0)
MCH: 28.3 pg (ref 26.0–34.0)
MCHC: 33.7 g/dL (ref 32.0–36.0)
MCV: 83.9 fL (ref 80.0–100.0)
PLATELETS: 193 10*3/uL (ref 150–440)
RBC: 5.97 MIL/uL — ABNORMAL HIGH (ref 4.40–5.90)
RDW: 14.5 % (ref 11.5–14.5)
WBC: 5 10*3/uL (ref 3.8–10.6)

## 2016-02-06 LAB — LIPASE, BLOOD: Lipase: 28 U/L (ref 11–51)

## 2016-02-06 MED ORDER — SODIUM CHLORIDE 0.9 % IV SOLN
1000.0000 mL | Freq: Once | INTRAVENOUS | Status: AC
  Administered 2016-02-06: 1000 mL via INTRAVENOUS

## 2016-02-06 MED ORDER — ALBUTEROL SULFATE HFA 108 (90 BASE) MCG/ACT IN AERS: 2.0000 | INHALATION_SPRAY | Freq: Four times a day (QID) | RESPIRATORY_TRACT | Status: DC | PRN

## 2016-02-06 MED ORDER — IPRATROPIUM-ALBUTEROL 0.5-2.5 (3) MG/3ML IN SOLN
3.0000 mL | Freq: Once | RESPIRATORY_TRACT | Status: AC
  Administered 2016-02-06: 3 mL via RESPIRATORY_TRACT
  Filled 2016-02-06: qty 3

## 2016-02-06 MED ORDER — PREDNISONE 50 MG PO TABS: 50.0000 mg | ORAL_TABLET | Freq: Every day | ORAL | Status: DC

## 2016-02-06 MED ORDER — PREDNISONE 20 MG PO TABS
60.0000 mg | ORAL_TABLET | Freq: Once | ORAL | Status: AC
  Administered 2016-02-06: 60 mg via ORAL
  Filled 2016-02-06: qty 3

## 2016-02-06 NOTE — ED Notes (Signed)
Pt presents with flu like sx and dizziness since last week.

## 2016-02-06 NOTE — Discharge Instructions (Signed)
Asthma, Acute Bronchospasm °Acute bronchospasm caused by asthma is also referred to as an asthma attack. Bronchospasm means your air passages become narrowed. The narrowing is caused by inflammation and tightening of the muscles in the air tubes (bronchi) in your lungs. This can make it hard to breathe or cause you to wheeze and cough. °CAUSES °Possible triggers are: °· Animal dander from the skin, hair, or feathers of animals. °· Dust mites contained in house dust. °· Cockroaches. °· Pollen from trees or grass. °· Mold. °· Cigarette or tobacco smoke. °· Air pollutants such as dust, household cleaners, hair sprays, aerosol sprays, paint fumes, strong chemicals, or strong odors. °· Cold air or weather changes. Cold air may trigger inflammation. Winds increase molds and pollens in the air. °· Strong emotions such as crying or laughing hard. °· Stress. °· Certain medicines such as aspirin or beta-blockers. °· Sulfites in foods and drinks, such as dried fruits and wine. °· Infections or inflammatory conditions, such as a flu, cold, or inflammation of the nasal membranes (rhinitis). °· Gastroesophageal reflux disease (GERD). GERD is a condition where stomach acid backs up into your esophagus. °· Exercise or strenuous activity. °SIGNS AND SYMPTOMS  °· Wheezing. °· Excessive coughing, particularly at night. °· Chest tightness. °· Shortness of breath. °DIAGNOSIS  °Your health care provider will ask you about your medical history and perform a physical exam. A chest X-ray or blood testing may be performed to look for other causes of your symptoms or other conditions that may have triggered your asthma attack.  °TREATMENT  °Treatment is aimed at reducing inflammation and opening up the airways in your lungs.  Most asthma attacks are treated with inhaled medicines. These include quick relief or rescue medicines (such as bronchodilators) and controller medicines (such as inhaled corticosteroids). These medicines are sometimes  given through an inhaler or a nebulizer. Systemic steroid medicine taken by mouth or given through an IV tube also can be used to reduce the inflammation when an attack is moderate or severe. Antibiotic medicines are only used if a bacterial infection is present.  °HOME CARE INSTRUCTIONS  °· Rest. °· Drink plenty of liquids. This helps the mucus to remain thin and be easily coughed up. Only use caffeine in moderation and do not use alcohol until you have recovered from your illness. °· Do not smoke. Avoid being exposed to secondhand smoke. °· You play a critical role in keeping yourself in good health. Avoid exposure to things that cause you to wheeze or to have breathing problems. °· Keep your medicines up-to-date and available. Carefully follow your health care provider's treatment plan. °· Take your medicine exactly as prescribed. °· When pollen or pollution is bad, keep windows closed and use an air conditioner or go to places with air conditioning. °· Asthma requires careful medical care. See your health care provider for a follow-up as advised. If you are more than [redacted] weeks pregnant and you were prescribed any new medicines, let your obstetrician know about the visit and how you are doing. Follow up with your health care provider as directed. °· After you have recovered from your asthma attack, make an appointment with your outpatient doctor to talk about ways to reduce the likelihood of future attacks. If you do not have a doctor who manages your asthma, make an appointment with a primary care doctor to discuss your asthma. °SEEK IMMEDIATE MEDICAL CARE IF:  °· You are getting worse. °· You have trouble breathing. If severe, call your local   emergency services (911 in the [place]  You develop chest pain or discomfort.  You are vomiting.  You are not able to keep fluids down.  You are coughing up yellow, green, brown, or bloody sputum.  You have a fever and your symptoms suddenly get worse.  You have  trouble swallowing. MAKE SURE YOU:   Understand these instructions.  Will watch your condition.  Will get help right away if you are not doing well or get worse.   This information is not intended to replace advice given to you by your health care provider. Make sure you discuss any questions you have with your health care provider.   Document Released: 02/27/2007 Document Revised: 11/17/2013 Document Reviewed: 05/20/2013 Elsevier Interactive Patient Education 2016 [employer]  Abdominal Pain, Adult Many things can cause belly (abdominal) pain. Most times, the belly pain is not dangerous. Many cases of belly pain can be watched and treated at home. HOME CARE   Do not take medicines that help you go poop (laxatives) unless told to by your doctor.  Only take medicine as told by your doctor.  Eat or drink as told by your doctor. Your doctor will tell you if you should be on a special diet. GET HELP IF:  You do not know what is causing your belly pain.  You have belly pain while you are sick to your stomach (nauseous) or have runny poop (diarrhea).  You have pain while you pee or poop.  Your belly pain wakes you up at night.  You have belly pain that gets worse or better when you eat.  You have belly pain that gets worse when you eat fatty foods.  You have a fever. GET HELP RIGHT AWAY IF:   The pain does not go away within 2 hours.  You keep throwing up (vomiting).  The pain changes and is only in the right or left part of the belly.  You have bloody or tarry looking poop. MAKE SURE YOU:   Understand these instructions.  Will watch your condition.  Will get help right away if you are not doing well or get worse.   This information is not intended to replace advice given to you by your health care provider. Make sure you discuss any questions you have with your health care provider.   Document Released: 04/30/2008 Document Revised: 12/03/2014 Document Reviewed:  07/22/2013 Elsevier Interactive Patient Education [employer].

## 2016-02-06 NOTE — ED Provider Notes (Signed)
[facility] Emergency Department Provider Note  ____________________________________________    I have reviewed the triage vital signs and the nursing notes.   HISTORY  Chief Complaint Dizziness and Influenza    HPI Logan Lucas is a 40 y.o. male who presents with complaints of abdominal cramping, dizziness and flulike symptoms over the last several days. He reports he also feels like his asthma is acting up because his lungs feel "tight". He denies fevers or chills. No diarrhea. Does complain of mild nausea but no vomiting.     Past Medical History  Diagnosis Date  . Asthma     There are no active problems to display for this patient.   History reviewed. No pertinent past surgical history.  Current Outpatient Rx  Name  Route  Sig  Dispense  Refill  . carisoprodol (SOMA) 350 MG tablet   Oral   Take 1 tablet (350 mg total) by mouth 3 (three) times daily as needed for muscle spasms.   15 tablet   0   . docusate sodium (COLACE) 100 MG capsule   Oral   Take 1 capsule (100 mg total) by mouth daily as needed for mild constipation or moderate constipation.   30 capsule   0   . HYDROcodone-acetaminophen (NORCO) 5-325 MG tablet   Oral   Take 1-2 tablets by mouth every 4 (four) hours as needed for moderate pain.   8 tablet   0   . ibuprofen (ADVIL,MOTRIN) 800 MG tablet   Oral   Take 1 tablet (800 mg total) by mouth every 8 (eight) hours as needed. Patient not taking: Reported on 12/16/2015   30 tablet   0   . penicillin v potassium (VEETID) 500 MG tablet   Oral   Take 1 tablet (500 mg total) by mouth 4 (four) times daily. Patient not taking: Reported on 12/16/2015   40 tablet   0   . traMADol (ULTRAM) 50 MG tablet   Oral   Take 1 tablet (50 mg total) by mouth every 6 (six) hours as needed. Patient not taking: Reported on 12/16/2015   20 tablet   0     Allergies Review of patient's allergies indicates no known  allergies.  Family History  Problem Relation Age of Onset  . Diabetes Mother   . Cancer Father     Social History Social History  Substance Use Topics  . Smoking status: Current Every Day Smoker -- 1.00 packs/day for 5 years    Types: Cigarettes  . Smokeless tobacco: None  . Alcohol Use: 4.2 oz/week    5 Shots of liquor, 2 Cans of beer per week    Review of Systems  Constitutional: Negative for fever. Eyes: Negative for redness ENT: Negative for sore throat Cardiovascular: Negative for chest pain Respiratory: "Lung tightness " Gastrointestinal: Abdominal cramps Genitourinary: Negative for dysuria. Musculoskeletal: Negative for back pain. Skin: Negative for rash. Neurological: Negative for focal weakness Psychiatric: Mild anxiety    ____________________________________________   PHYSICAL EXAM:  VITAL SIGNS: ED Triage Vitals  Enc Vitals Group     BP 02/06/16 0900 135/98 mmHg     Pulse Rate 02/06/16 0858 84     Resp 02/06/16 0858 20     Temp 02/06/16 0858 97.9 F (36.6 C)     Temp Source 02/06/16 0858 Oral     SpO2 02/06/16 0858 95 %     Weight 02/06/16 0858 250 lb (113.399 kg)     Height 02/06/16  [id number] 5\' 9"  (1.753 m)     Head Cir --      Peak Flow --      Pain Score --      Pain Loc --      Pain Edu? --      Excl. in [place]? --      Constitutional: Alert and oriented. Well appearing and in no distress.  Eyes: Conjunctivae are normal. No erythema or injection ENT   Head: Normocephalic and atraumatic.   Mouth/Throat: Mucous membranes are moist. Cardiovascular: Normal rate, regular rhythm. Normal and symmetric distal pulses are present in the upper extremities.  Respiratory: Normal respiratory effort without tachypnea nor retractions. Breath sounds with scattered wheezes bilaterally Gastrointestinal: Soft and non-tender in all quadrants. No distention. There is no CVA tenderness. Genitourinary: deferred Musculoskeletal: Nontender with normal range of  motion in all extremities. No lower extremity tenderness nor edema. Neurologic:  Normal speech and language. No gross focal neurologic deficits are appreciated. Skin:  Skin is warm, dry and intact. No rash noted. Psychiatric: Mood and affect are normal. Patient exhibits appropriate insight and judgment.  ____________________________________________    LABS (pertinent positives/negatives)  Labs Reviewed  CBC  COMPREHENSIVE METABOLIC PANEL  LIPASE, BLOOD    ____________________________________________   EKG  ED ECG REPORT I, [name], the attending physician, personally viewed and interpreted this ECG.  Date: 02/06/2016 EKG Time: 9:03 AM Rate: 86 Rhythm: normal sinus rhythm QRS Axis: normal Intervals: normal ST/T Wave abnormalities: Inverted T-wave in lead 3, unchanged from prior Conduction Disturbances: none Narrative Interpretation: Unchanged from prior EKG   ____________________________________________    RADIOLOGY  None  ____________________________________________   PROCEDURES  Procedure(s) performed: none  Critical Care performed: none  ____________________________________________   INITIAL IMPRESSION / ASSESSMENT AND PLAN / ED COURSE  Pertinent labs & imaging results that were available during my care of the patient were reviewed by me and considered in my medical decision making (see chart for details).  Patient overall well-appearing. He is complaining of vague abdominal cramps in the upper abdomen. We'll check labs, give a DuoNeb for his complaints of lung tightness and reevaluate.  Patient has had significant improvement after nebs. Lab work is unremarkable . He is no longer having abdominal cramps. He is appropriate for discharge with Rx for prednisone and albuterol inhaler  ____________________________________________   FINAL CLINICAL IMPRESSION(S) / ED DIAGNOSES  Final diagnoses:  Asthma exacerbation  Abdominal cramps           [name], MD 02/06/16 1210

## 2016-04-17 ENCOUNTER — Emergency Department
Admission: EM | Admit: 2016-04-17 | Discharge: 2016-04-17 | Disposition: A | Discharge status: Home / Self Care | Payer: Self-pay | Attending: Emergency Medicine | Admitting: Emergency Medicine

## 2016-04-17 ENCOUNTER — Encounter: Payer: Self-pay | Admitting: Emergency Medicine

## 2016-04-17 DIAGNOSIS — F1290 Cannabis use, unspecified, uncomplicated: Secondary | ICD-10-CM | POA: Insufficient documentation

## 2016-04-17 DIAGNOSIS — Z79899 Other long term (current) drug therapy: Secondary | ICD-10-CM | POA: Insufficient documentation

## 2016-04-17 DIAGNOSIS — H7291 Unspecified perforation of tympanic membrane, right ear: Secondary | ICD-10-CM | POA: Insufficient documentation

## 2016-04-17 DIAGNOSIS — F17210 Nicotine dependence, cigarettes, uncomplicated: Secondary | ICD-10-CM | POA: Insufficient documentation

## 2016-04-17 DIAGNOSIS — J45909 Unspecified asthma, uncomplicated: Secondary | ICD-10-CM | POA: Insufficient documentation

## 2016-04-17 MED ORDER — HYDROCODONE-ACETAMINOPHEN 5-325 MG PO TABS: 1.0000 | ORAL_TABLET | Freq: Four times a day (QID) | ORAL | Status: DC | PRN

## 2016-04-17 MED ORDER — HYDROCODONE-ACETAMINOPHEN 5-325 MG PO TABS
1.0000 | ORAL_TABLET | Freq: Once | ORAL | Status: AC
  Administered 2016-04-17: 1 via ORAL

## 2016-04-17 MED ORDER — AMOXICILLIN-POT CLAVULANATE 875-125 MG PO TABS
1.0000 | ORAL_TABLET | Freq: Two times a day (BID) | ORAL | Status: DC
Start: 2016-04-17 — End: 2017-12-02

## 2016-04-17 MED ORDER — HYDROCODONE-ACETAMINOPHEN 5-325 MG PO TABS
ORAL_TABLET | ORAL | Status: AC
  Filled 2016-04-17: qty 1

## 2016-04-17 MED ORDER — CIPROFLOXACIN HCL 0.3 % OP SOLN: 2.0000 [drp] | OPHTHALMIC | Status: DC

## 2016-04-17 NOTE — ED Notes (Signed)
Presents with pain to ear  PA in room on arrival

## 2016-04-17 NOTE — Discharge Instructions (Signed)
Eardrum Perforation The eardrum is a thin, round tissue inside the ear. It allows you to hear. The eardrum can get torn (perforated). Eardrums often heal on their own. There is often little or no long-term hearing loss. HOME CARE  Keep your ear dry while it heals. Do not let your head go under water. Do not swim or dive until your doctor says it is okay.  Before you take a bath or shower, do one of these things to keep water out of your ear:  Put a waterproof earplug in your ear.  Put petroleum jelly all over a cotton ball. Put the cotton ball in your ear.  Take medicines only as told by your doctor.  Avoid blowing your nose if you can. If you blow your nose, do it gently.  Continue your normal activities after your eardrum heals. Your doctor will tell you when your eardrum has healed.  Talk to your doctor before you fly on an airplane.  Keep all doctor follow-up visits as told by your doctor. This is important. GET HELP IF:  You have a fever. GET HELP RIGHT AWAY IF:  You have blood or yellowish-white fluid (pus) coming from your ear.  You feel dizzy or off balance.  You feel sick to your stomach (nauseous), or you throw up (vomit).  You have more pain.   This information is not intended to replace advice given to you by your health care provider. Make sure you discuss any questions you have with your health care provider.   Document Released: 05/02/2010 Document Revised: 12/03/2014 Document Reviewed: 06/21/2014 Elsevier Interactive Patient Education [employer].

## 2016-04-17 NOTE — ED Provider Notes (Signed)
[facility] Emergency Department Provider Note ____________________________________________  Time seen: Approximately 1:28 PM  I have reviewed the triage vital signs and the nursing notes.   HISTORY  Chief Complaint Otalgia    HPI Logan Lucas is a 40 y.o. male who presents to the emergency department for evaluation of right ear pain. Pain has been present for the past 3 days with a decrease in hearing and pain in the right ear. He has taken ibuprofen without relief. He denies fever or sore throat.  Past Medical History  Diagnosis Date  . Asthma     There are no active problems to display for this patient.   History reviewed. No pertinent past surgical history.  Current Outpatient Rx  Name  Route  Sig  Dispense  Refill  . albuterol (PROVENTIL HFA;VENTOLIN HFA) 108 (90 Base) MCG/ACT inhaler   Inhalation   Inhale 2 puffs into the lungs every 6 (six) hours as needed for wheezing or shortness of breath.   1 Inhaler   2   . amoxicillin-clavulanate (AUGMENTIN) 875-125 MG tablet   Oral   Take 1 tablet by mouth 2 (two) times daily.   20 tablet   0   . carisoprodol (SOMA) 350 MG tablet   Oral   Take 1 tablet (350 mg total) by mouth 3 (three) times daily as needed for muscle spasms. Patient not taking: Reported on 02/06/2016   15 tablet   0   . ciprofloxacin (CILOXAN) 0.3 % ophthalmic solution   Right Ear   Place 2 drops into the right ear every 4 (four) hours while awake.   5 mL   0   . docusate sodium (COLACE) 100 MG capsule   Oral   Take 1 capsule (100 mg total) by mouth daily as needed for mild constipation or moderate constipation. Patient not taking: Reported on 02/06/2016   30 capsule   0   . guaiFENesin (MUCINEX) 600 MG 12 hr tablet   Oral   Take 600 mg by mouth every 12 (twelve) hours.         Marland Kitchen HYDROcodone-acetaminophen (NORCO/VICODIN) 5-325 MG tablet   Oral   Take 1 tablet by mouth every 6 (six) hours as needed for  moderate pain.   12 tablet   0     Allergies Review of patient's allergies indicates no known allergies.  Family History  Problem Relation Age of Onset  . Diabetes Mother   . Cancer Father     Social History Social History  Substance Use Topics  . Smoking status: Current Every Day Smoker -- 1.00 packs/day for 5 years    Types: Cigarettes  . Smokeless tobacco: None  . Alcohol Use: 4.2 oz/week    5 Shots of liquor, 2 Cans of beer per week    Review of Systems Constitutional: Negative for fever/chills Eyes: No visual changes. ENT: Positive for right earache. Gastrointestinal: No abdominal pain.  No nausea, no vomiting.  No diarrhea.  No constipation. Musculoskeletal: Negative for pain. Skin: Negative for rash. Neurological: Negative for headaches, focal weakness or numbness. ____________________________________________   PHYSICAL EXAM:150/88; 72; 20; 98%; 98.0  VITAL SIGNS: ED Triage Vitals  Enc Vitals Group     BP --      Pulse --      Resp --      Temp --      Temp src --      SpO2 --      Weight --  Height --      Head Cir --      Peak Flow --      Pain Score --      Pain Loc --      Pain Edu? --      Excl. in [place]? --     Constitutional: Alert and oriented. Well appearing and in no acute distress. Eyes: Conjunctivae are normal. PERRL. EOMI. Ears: No pain with movement of either auricle:; External canal patent bilateral;  Right TM with significant perforation and erythema; Left TM mildly erythematous with intact membrane.   Head: Atraumatic. Nose: No congestion/rhinnorhea. Mouth/Throat: Mucous membranes are moist.  Oropharynx non-erythematous. Hematological/Lymphatic/Immunilogical: No cervical lymphadenopathy. Cardiovascular: Normal capillary refill. Respiratory: Normal respiratory effort.  No retractions.  Neurologic:  Normal speech and language. No gross focal neurologic deficits are appreciated. Speech is normal. No gait instability. Skin:  Skin  is warm, dry and intact. No rash noted. ____________________________________________   LABS (all labs ordered are listed, but only abnormal results are displayed)  Labs Reviewed - No data to display ____________________________________________   RADIOLOGY   ____________________________________________   PROCEDURES  Procedure(s) performed: None  ____________________________________________   INITIAL IMPRESSION / ASSESSMENT AND PLAN / ED COURSE  Pertinent labs & imaging results that were available during my care of the patient were reviewed by me and considered in my medical decision making (see chart for details).  Prescriptions for Cipro drops, Augmentin, and Norco will be given today.  The patient was advised to follow up with ENT. He was advised to return to the emergency department for symptoms that change or worsen if unable to schedule an appointment.  ____________________________________________   FINAL CLINICAL IMPRESSION(S) / ED DIAGNOSES  Final diagnoses:  Tympanic membrane rupture, right    Note:  This document was prepared using Dragon voice recognition software and may include unintentional dictation errors.     Victorino Dike, FNP 04/17/16 1421  Daymon Larsen, MD 04/17/16 1426

## 2017-07-05 ENCOUNTER — Emergency Department
Admission: EM | Admit: 2017-07-05 | Discharge: 2017-07-05 | Disposition: A | Discharge status: Home / Self Care | Payer: Self-pay | Attending: Emergency Medicine | Admitting: Emergency Medicine

## 2017-07-05 ENCOUNTER — Encounter: Payer: Self-pay | Admitting: Emergency Medicine

## 2017-07-05 DIAGNOSIS — R252 Cramp and spasm: Secondary | ICD-10-CM | POA: Insufficient documentation

## 2017-07-05 DIAGNOSIS — S53402A Unspecified sprain of left elbow, initial encounter: Secondary | ICD-10-CM | POA: Insufficient documentation

## 2017-07-05 DIAGNOSIS — Y939 Activity, unspecified: Secondary | ICD-10-CM | POA: Insufficient documentation

## 2017-07-05 DIAGNOSIS — E860 Dehydration: Secondary | ICD-10-CM | POA: Insufficient documentation

## 2017-07-05 DIAGNOSIS — Z79899 Other long term (current) drug therapy: Secondary | ICD-10-CM | POA: Insufficient documentation

## 2017-07-05 DIAGNOSIS — Y990 Civilian activity done for income or pay: Secondary | ICD-10-CM | POA: Insufficient documentation

## 2017-07-05 DIAGNOSIS — X500XXA Overexertion from strenuous movement or load, initial encounter: Secondary | ICD-10-CM | POA: Insufficient documentation

## 2017-07-05 DIAGNOSIS — M62830 Muscle spasm of back: Secondary | ICD-10-CM

## 2017-07-05 DIAGNOSIS — Y929 Unspecified place or not applicable: Secondary | ICD-10-CM | POA: Insufficient documentation

## 2017-07-05 DIAGNOSIS — F17210 Nicotine dependence, cigarettes, uncomplicated: Secondary | ICD-10-CM | POA: Insufficient documentation

## 2017-07-05 DIAGNOSIS — S46919A Strain of unspecified muscle, fascia and tendon at shoulder and upper arm level, unspecified arm, initial encounter: Secondary | ICD-10-CM

## 2017-07-05 DIAGNOSIS — J45909 Unspecified asthma, uncomplicated: Secondary | ICD-10-CM | POA: Insufficient documentation

## 2017-07-05 MED ORDER — MELOXICAM 15 MG PO TABS: 15.0000 mg | ORAL_TABLET | Freq: Every day | ORAL | 0 refills | Status: DC

## 2017-07-05 MED ORDER — CYCLOBENZAPRINE HCL 10 MG PO TABS: 10.0000 mg | ORAL_TABLET | Freq: Three times a day (TID) | ORAL | 0 refills | Status: DC | PRN

## 2017-07-05 NOTE — ED Provider Notes (Signed)
[facility] Emergency Department Provider Note  ____________________________________________  Time seen: Approximately 4:36 PM  I have reviewed the triage vital signs and the nursing notes.   HISTORY  Chief Complaint No chief complaint on file.    HPI Logan Lucas is a 41 y.o. male who presents to emergency department complaining ofback spasms, bilateral elbow pain, bilateral lower leg cramping. Patient reports that he began a new job requiring him to work and hot temperatures performing heavy lifting on a regular basis. Patient reports that he has noted complaints started after starting this new job. Patient has tried ibuprofen intermittently which helped somewhat but still fully alleviate symptoms. Patient reports that he does drink approximately 2 L of water today but does not really use sore on her, per palp, Gatorade. Patient denies any headache, visual changes, chest pain, shortness of breath, abdominal pain, nausea or vomiting. Patient reports that symptoms typically improve after several days off.   Past Medical History:  Diagnosis Date  . Asthma     There are no active problems to display for this patient.   History reviewed. No pertinent surgical history.  Prior to Admission medications   Medication Sig Start Date End Date Taking? Authorizing Provider  albuterol (PROVENTIL HFA;VENTOLIN HFA) 108 (90 Base) MCG/ACT inhaler Inhale 2 puffs into the lungs every 6 (six) hours as needed for wheezing or shortness of breath. 02/06/16   [name], MD  amoxicillin-clavulanate (AUGMENTIN) 875-125 MG tablet Take 1 tablet by mouth 2 (two) times daily. 04/17/16   Triplett, Johnette Abraham B, FNP  carisoprodol (SOMA) 350 MG tablet Take 1 tablet (350 mg total) by mouth 3 (three) times daily as needed for muscle spasms. Patient not taking: Reported on 02/06/2016 12/16/15   Orbie Pyo, MD  ciprofloxacin (CILOXAN) 0.3 % ophthalmic solution Place 2 drops into  the right ear every 4 (four) hours while awake. 04/17/16   Triplett, Johnette Abraham B, FNP  cyclobenzaprine (FLEXERIL) 10 MG tablet Take 1 tablet (10 mg total) by mouth 3 (three) times daily as needed for muscle spasms. 07/05/17   Cuthriell, Charline Bills, PA-C  guaiFENesin (MUCINEX) 600 MG 12 hr tablet Take 600 mg by mouth every 12 (twelve) hours.    [provider]  HYDROcodone-acetaminophen (NORCO/VICODIN) 5-325 MG tablet Take 1 tablet by mouth every 6 (six) hours as needed for moderate pain. 04/17/16   Triplett, Johnette Abraham B, FNP  meloxicam (MOBIC) 15 MG tablet Take 1 tablet (15 mg total) by mouth daily. 07/05/17   Cuthriell, Charline Bills, PA-C    Allergies Patient has no known allergies.  Family History  Problem Relation Age of Onset  . Diabetes Mother   . Cancer Father     Social History Social History  Substance Use Topics  . Smoking status: Current Every Day Smoker    Packs/day: 1.00    Years: 5.00    Types: Cigarettes  . Smokeless tobacco: Never Used  . Alcohol use 4.2 oz/week    2 Cans of beer, 5 Shots of liquor per week     Review of Systems  Constitutional: No fever/chills Eyes: No visual changes. No discharge ENT: No upper respiratory complaints. Cardiovascular: no chest pain. Respiratory: no cough. No SOB. Gastrointestinal: No abdominal pain.  No nausea, no vomiting.  No diarrhea.  No constipation. Genitourinary: Negative for dysuria. No hematuria Musculoskeletal: Positive for bilateral elbow pain, back spasms, leg cramps. Skin: Negative for rash, abrasions, lacerations, ecchymosis. Neurological: Negative for headaches, focal weakness or numbness. 10-point ROS  otherwise negative.  ____________________________________________   PHYSICAL EXAM:  VITAL SIGNS: ED Triage Vitals [07/05/17 1602]  Enc Vitals Group     BP 135/79     Pulse Rate 80     Resp 18     Temp 98.3 F (36.8 C)     Temp Source Oral     SpO2 96 %     Weight 290 lb (131.5 kg)     Height 5\' 9"  (1.753  m)     Head Circumference      Peak Flow      Pain Score      Pain Loc      Pain Edu?      Excl. in [place]?      Constitutional: Alert and oriented. Well appearing and in no acute distress. Eyes: Conjunctivae are normal. PERRL. EOMI. Head: Atraumatic. ENT:      Ears:       Nose: No congestion/rhinnorhea.      Mouth/Throat: Mucous membranes are moist.  Neck: No stridor.  No cervical spine tenderness to palpation.  Cardiovascular: Normal rate, regular rhythm. Normal S1 and S2.  Good peripheral circulation. Respiratory: Normal respiratory effort without tachypnea or retractions. Lungs CTAB. Good air entry to the bases with no decreased or absent breath sounds. Musculoskeletal: Full range of motion to all extremities. No gross deformities appreciated.No tenderness to palpation over os is structures of the spine. Spasms are appreciated and paraspinal muscle group with associated tenderness. This is throughout the thoracic and lumbar spine. No tenderness to palpation over bilateral sinuses. Dorsalis pedis pulse and sensation intact bilateral lower extremity. Examination of bilateral elbows reveals diffuse tenderness over the musculature. No tenderness over the osseous structures. No palpable abnormality. Full range of motion bilateral elbows. Radial pulse intact bilateral pressure me. Sensation intact and equal bilateral upper extremities. Neurologic:  Normal speech and language. No gross focal neurologic deficits are appreciated.  Skin:  Skin is warm, dry and intact. No rash noted. Psychiatric: Mood and affect are normal. Speech and behavior are normal. Patient exhibits appropriate insight and judgement.   ____________________________________________   LABS (all labs ordered are listed, but only abnormal results are displayed)  Labs Reviewed - No data to display ____________________________________________  EKG   ____________________________________________  RADIOLOGY   No results  found.  ____________________________________________    PROCEDURES  Procedure(s) performed:    Procedures    Medications - No data to display   ____________________________________________   INITIAL IMPRESSION / ASSESSMENT AND PLAN / ED COURSE  Pertinent labs & imaging results that were available during my care of the patient were reviewed by me and considered in my medical decision making (see chart for details).  Review of the [place] CSRS was performed in accordance of the [place] prior to dispensing any controlled drugs.     Patient's diagnosis is consistent with muscle cramps and spasms as well as bilateral strain due to repeated heavy lifting. Patient also is experiencing mild dehydration symptoms from heavy lifting in undergarments. Patient is not clinically dehydrated.. Patient is encouraged to drink plenty of fluids including replenishing electrolytes. Patient will also be placed on anti-inflammatory and muscle relaxer for symptom control. No indication for labs or imaging at this time. Patient will follow-up with primary care as needed..  Patient is given ED precautions to return to the ED for any worsening or new symptoms.     ____________________________________________  FINAL CLINICAL IMPRESSION(S) / ED DIAGNOSES  Final diagnoses:  Muscle cramps  Mild dehydration  Back muscle spasm  Strain of elbow, unspecified laterality, initial encounter      NEW MEDICATIONS STARTED DURING THIS VISIT:  Discharge Medication List as of 07/05/2017  4:48 PM    START taking these medications   Details  cyclobenzaprine (FLEXERIL) 10 MG tablet Take 1 tablet (10 mg total) by mouth 3 (three) times daily as needed for muscle spasms., Starting Fri 07/05/2017, Print    meloxicam (MOBIC) 15 MG tablet Take 1 tablet (15 mg total) by mouth daily., Starting Fri 07/05/2017, Print            This chart was dictated using voice recognition software/Dragon. Despite best efforts to  proofread, errors can occur which can change the meaning. Any change was purely unintentional.    Darletta Moll, PA-C 07/05/17 1708    Clearnce Hasten Randall An, MD 07/05/17 2350

## 2017-07-05 NOTE — ED Notes (Signed)
Pt. States soreness to back, bilateral elbows for the past two weeks.  Pt. States he started new job about 1 month ago that involves a lot of lifting.  Pt. Also states cramping to bilateral legs in morning after stretching.

## 2017-07-05 NOTE — ED Triage Notes (Signed)
Pt. States starting new job about 1 month ago.  Pt. States increased pain to bilateral elbows and lower back in the past 2 weeks.  Pt. States in the morning while stretching pt. States cramping to bilateral legs.

## 2017-08-27 ENCOUNTER — Emergency Department: Payer: PRIVATE HEALTH INSURANCE

## 2017-08-27 ENCOUNTER — Emergency Department
Admission: EM | Admit: 2017-08-27 | Discharge: 2017-08-27 | Disposition: A | Discharge status: Home / Self Care | Payer: PRIVATE HEALTH INSURANCE | Attending: Emergency Medicine | Admitting: Emergency Medicine

## 2017-08-27 ENCOUNTER — Encounter: Payer: Self-pay | Admitting: Emergency Medicine

## 2017-08-27 DIAGNOSIS — M25561 Pain in right knee: Secondary | ICD-10-CM | POA: Diagnosis present

## 2017-08-27 DIAGNOSIS — Z79899 Other long term (current) drug therapy: Secondary | ICD-10-CM | POA: Diagnosis not present

## 2017-08-27 DIAGNOSIS — J45909 Unspecified asthma, uncomplicated: Secondary | ICD-10-CM | POA: Insufficient documentation

## 2017-08-27 DIAGNOSIS — F17210 Nicotine dependence, cigarettes, uncomplicated: Secondary | ICD-10-CM | POA: Diagnosis not present

## 2017-08-27 MED ORDER — TRAMADOL HCL 50 MG PO TABS: 50.0000 mg | ORAL_TABLET | Freq: Four times a day (QID) | ORAL | 0 refills | Status: DC | PRN

## 2017-08-27 MED ORDER — NAPROXEN 500 MG PO TABS: 500.0000 mg | ORAL_TABLET | Freq: Two times a day (BID) | ORAL | Status: DC

## 2017-08-27 NOTE — ED Notes (Signed)
Pt states right knee pain and right elbow pain, states while at work he stood up after being on his knee and felt like it began to hurt, denies any other injury, right elbow pain for sometime, denies any injury, awake and alert In no acute distress, ambulatory

## 2017-08-27 NOTE — ED Notes (Signed)
Pt in NAD at time of d/c. Pt verbalizes understanding of d/c and RX. Pt up to wheelchair and rolled into lobby/

## 2017-08-27 NOTE — ED Provider Notes (Signed)
[facility] Emergency Department Provider Note   ____________________________________________   First MD Initiated Contact with Patient 08/27/17 1153     (approximate)  I have reviewed the triage vital signs and the nursing notes.   HISTORY  Chief Complaint Knee Pain    HPI Logan Lucas is a 41 y.o. male patient complaining of right knee pain secondary to prolonged flexion position yesterday at work. Patient stated when he stood up he felt Lexapro and pulled and anterior part of his knee. Patient pain increase with ambulation.Patient rates the pain as a 10 over 10. Patient had a pain as "achy". No palliative measures for complaint.   Past Medical History:  Diagnosis Date  . Asthma     There are no active problems to display for this patient.   History reviewed. No pertinent surgical history.  Prior to Admission medications   Medication Sig Start Date End Date Taking? Authorizing Provider  albuterol (PROVENTIL HFA;VENTOLIN HFA) 108 (90 Base) MCG/ACT inhaler Inhale 2 puffs into the lungs every 6 (six) hours as needed for wheezing or shortness of breath. 02/06/16   [name], MD  amoxicillin-clavulanate (AUGMENTIN) 875-125 MG tablet Take 1 tablet by mouth 2 (two) times daily. 04/17/16   Triplett, Johnette Abraham B, FNP  carisoprodol (SOMA) 350 MG tablet Take 1 tablet (350 mg total) by mouth 3 (three) times daily as needed for muscle spasms. Patient not taking: Reported on 02/06/2016 12/16/15   Orbie Pyo, MD  ciprofloxacin (CILOXAN) 0.3 % ophthalmic solution Place 2 drops into the right ear every 4 (four) hours while awake. 04/17/16   Triplett, Johnette Abraham B, FNP  cyclobenzaprine (FLEXERIL) 10 MG tablet Take 1 tablet (10 mg total) by mouth 3 (three) times daily as needed for muscle spasms. 07/05/17   Cuthriell, Charline Bills, PA-C  guaiFENesin (MUCINEX) 600 MG 12 hr tablet Take 600 mg by mouth every 12 (twelve) hours.    [provider]    HYDROcodone-acetaminophen (NORCO/VICODIN) 5-325 MG tablet Take 1 tablet by mouth every 6 (six) hours as needed for moderate pain. 04/17/16   Triplett, Johnette Abraham B, FNP  meloxicam (MOBIC) 15 MG tablet Take 1 tablet (15 mg total) by mouth daily. 07/05/17   Cuthriell, Charline Bills, PA-C  naproxen (NAPROSYN) 500 MG tablet Take 1 tablet (500 mg total) by mouth 2 (two) times daily with a meal. 08/27/17   Sable Feil, PA-C  traMADol (ULTRAM) 50 MG tablet Take 1 tablet (50 mg total) by mouth every 6 (six) hours as needed for moderate pain. 08/27/17   Sable Feil, PA-C    Allergies Patient has no known allergies.  Family History  Problem Relation Age of Onset  . Diabetes Mother   . Cancer Father     Social History Social History  Substance Use Topics  . Smoking status: Current Every Day Smoker    Packs/day: 1.00    Years: 5.00    Types: Cigarettes  . Smokeless tobacco: Never Used  . Alcohol use 4.2 oz/week    2 Cans of beer, 5 Shots of liquor per week    Review of Systems  Constitutional: No fever/chills Eyes: No visual changes. ENT: No sore throat. Cardiovascular: Denies chest pain. Respiratory: Denies shortness of breath. Gastrointestinal: No abdominal pain.  No nausea, no vomiting.  No diarrhea.  No constipation. Genitourinary: Negative for dysuria. Musculoskeletal: Right knee pain  Skin: Negative for rash. Neurological: Negative for headaches, focal weakness or numbness.   ____________________________________________   PHYSICAL  EXAM:  VITAL SIGNS: ED Triage Vitals  Enc Vitals Group     BP 08/27/17 1113 (!) 123/91     Pulse Rate 08/27/17 1113 81     Resp 08/27/17 1113 18     Temp 08/27/17 1113 98 F (36.7 C)     Temp Source 08/27/17 1113 Oral     SpO2 08/27/17 1113 96 %     Weight 08/27/17 1111 280 lb (127 kg)     Height 08/27/17 1111 5\' 9"  (1.753 m)     Head Circumference --      Peak Flow --      Pain Score 08/27/17 1110 10     Pain Loc --      Pain Edu? --       Excl. in [place]? --    Constitutional: Alert and oriented. Well appearing and in no acute distress. Cardiovascular: Normal rate, regular rhythm. Grossly normal heart sounds.  Good peripheral circulation. Respiratory: Normal respiratory effort.  No retractions. Lungs CTAB. Gastrointestinal: Soft and nontender. No distention. No abdominal bruits. No CVA tenderness. Musculoskeletal:No obvious deformity/edema erythema to the right knee. Moderate crepitus palpation. No effusion. Neurologic:  Normal speech and language. No gross focal neurologic deficits are appreciated. No gait instability. Skin:  Skin is warm, dry and intact. No rash noted. Psychiatric: Mood and affect are normal. Speech and behavior are normal.  ____________________________________________   LABS (all labs ordered are listed, but only abnormal results are displayed)  Labs Reviewed - No data to display ____________________________________________  EKG   ____________________________________________  RADIOLOGY  Dg Knee Complete 4 Views Right  Result Date: 08/27/2017 CLINICAL DATA:  Pain after bending with fall. Patient fell tearing sensation in knee EXAM: RIGHT KNEE - COMPLETE 4+ VIEW COMPARISON:  None. FINDINGS: Frontal, lateral, and bilateral oblique views were obtained. No fracture or dislocation. No joint effusion. Joint spaces appear normal. No erosive change. IMPRESSION: No fracture or joint effusion.  No evident arthropathy. Electronically Signed   By: Lowella Grip III M.D.   On: 08/27/2017 11:48    ____________________________________________   PROCEDURES  Procedure(s) performed: None  Procedures  Critical Care performed: No  ____________________________________________   INITIAL IMPRESSION / ASSESSMENT AND PLAN / ED COURSE  Pertinent labs & imaging results that were available during my care of the patient were reviewed by me and considered in my medical decision making (see chart for  details).  Acute right knee pain secondary to allow flexion and pressure on the knee at work. This neck x-ray finding with patient. Patient given discharge care instructions. Patient advised take medications directed. Patient advised follow "clinic condition persists. Patient went to work note for today.      ____________________________________________   FINAL CLINICAL IMPRESSION(S) / ED DIAGNOSES  Final diagnoses:  Acute pain of right knee      NEW MEDICATIONS STARTED DURING THIS VISIT:  New Prescriptions   NAPROXEN (NAPROSYN) 500 MG TABLET    Take 1 tablet (500 mg total) by mouth 2 (two) times daily with a meal.   TRAMADOL (ULTRAM) 50 MG TABLET    Take 1 tablet (50 mg total) by mouth every 6 (six) hours as needed for moderate pain.     Note:  This document was prepared using Dragon voice recognition software and may include unintentional dictation errors.    Sable Feil, PA-C 08/27/17 1203    Earleen Newport, MD 08/27/17 1314

## 2017-08-27 NOTE — ED Triage Notes (Signed)
Pt c/o right knee pain starting last night. Went to get up and feels like pulled something or like something tore.  Pain with ambulation. VSS

## 2017-12-02 ENCOUNTER — Emergency Department: Payer: PRIVATE HEALTH INSURANCE

## 2017-12-02 ENCOUNTER — Emergency Department
Admission: EM | Admit: 2017-12-02 | Discharge: 2017-12-02 | Disposition: A | Discharge status: Home / Self Care | Payer: PRIVATE HEALTH INSURANCE | Attending: Emergency Medicine | Admitting: Emergency Medicine

## 2017-12-02 ENCOUNTER — Other Ambulatory Visit: Payer: Self-pay

## 2017-12-02 DIAGNOSIS — J029 Acute pharyngitis, unspecified: Secondary | ICD-10-CM | POA: Diagnosis not present

## 2017-12-02 DIAGNOSIS — R059 Cough, unspecified: Secondary | ICD-10-CM

## 2017-12-02 DIAGNOSIS — Z79899 Other long term (current) drug therapy: Secondary | ICD-10-CM | POA: Insufficient documentation

## 2017-12-02 DIAGNOSIS — H6981 Other specified disorders of Eustachian tube, right ear: Secondary | ICD-10-CM

## 2017-12-02 DIAGNOSIS — H6991 Unspecified Eustachian tube disorder, right ear: Secondary | ICD-10-CM | POA: Diagnosis not present

## 2017-12-02 DIAGNOSIS — F17210 Nicotine dependence, cigarettes, uncomplicated: Secondary | ICD-10-CM | POA: Diagnosis not present

## 2017-12-02 DIAGNOSIS — R05 Cough: Secondary | ICD-10-CM

## 2017-12-02 DIAGNOSIS — R0981 Nasal congestion: Secondary | ICD-10-CM | POA: Diagnosis not present

## 2017-12-02 DIAGNOSIS — J0100 Acute maxillary sinusitis, unspecified: Secondary | ICD-10-CM | POA: Diagnosis not present

## 2017-12-02 MED ORDER — AMOXICILLIN-POT CLAVULANATE 875-125 MG PO TABS: 1.0000 | ORAL_TABLET | Freq: Two times a day (BID) | ORAL | 0 refills | Status: DC

## 2017-12-02 MED ORDER — PSEUDOEPH-BROMPHEN-DM 30-2-10 MG/5ML PO SYRP: 10.0000 mL | ORAL_SOLUTION | Freq: Four times a day (QID) | ORAL | 0 refills | Status: DC | PRN

## 2017-12-02 MED ORDER — CETIRIZINE HCL 10 MG PO TABS: 10.0000 mg | ORAL_TABLET | Freq: Every day | ORAL | 0 refills | Status: DC

## 2017-12-02 MED ORDER — FLUTICASONE PROPIONATE 50 MCG/ACT NA SUSP: 1.0000 | Freq: Two times a day (BID) | NASAL | 0 refills | Status: DC

## 2017-12-02 NOTE — ED Triage Notes (Signed)
Pt c/o sinus and chest congestion with left ear pain for the past week.Logan Lucas

## 2017-12-02 NOTE — ED Provider Notes (Signed)
[facility] Emergency Department Provider Note  ____________________________________________  Time seen: Approximately 3:12 PM  I have reviewed the triage vital signs and the nursing notes.   HISTORY  Chief Complaint URI    HPI Logan Lucas is a 42 y.o. male who presents emergency department complaining of right ear fullness, nasal congestion, sore throat, cough.  Patient reports that her symptoms began with his ear feeling full and this is lasted for 1-2 weeks.  Patient reports that he began to have nasal congestion with sinus pressure over the past weekend.  He is also developed sore throat and cough.  Patient reports that he had a coughing episode last night that lasted "so long and was so tough, that he coughed up a few specks of blood."  No repeat of hemoptysis.  Patient denies any chest pain, shortness of breath, abdominal pain, nausea vomiting.  No headaches, visual changes, neck pain or stiffness.  Patient is not tried any medication including prescription or over-the-counter medications.  Patient does have a history of asthma but denies any audible wheezing or asthma exacerbation symptoms.  Past Medical History:  Diagnosis Date  . Asthma     There are no active problems to display for this patient.   History reviewed. No pertinent surgical history.  Prior to Admission medications   Medication Sig Start Date End Date Taking? Authorizing Provider  albuterol (PROVENTIL HFA;VENTOLIN HFA) 108 (90 Base) MCG/ACT inhaler Inhale 2 puffs into the lungs every 6 (six) hours as needed for wheezing or shortness of breath. 02/06/16   [name], MD  amoxicillin-clavulanate (AUGMENTIN) 875-125 MG tablet Take 1 tablet by mouth 2 (two) times daily. 12/02/17   Andrewjames Weirauch, Charline Bills, PA-C  brompheniramine-pseudoephedrine-DM 30-2-10 MG/5ML syrup Take 10 mLs by mouth 4 (four) times daily as needed. 12/02/17   Ellis Koffler, Charline Bills, PA-C  carisoprodol (SOMA) 350 MG  tablet Take 1 tablet (350 mg total) by mouth 3 (three) times daily as needed for muscle spasms. Patient not taking: Reported on 02/06/2016 12/16/15   Orbie Pyo, MD  cetirizine (ZYRTEC) 10 MG tablet Take 1 tablet (10 mg total) by mouth daily. 12/02/17   Yonis Carreon, Charline Bills, PA-C  ciprofloxacin (CILOXAN) 0.3 % ophthalmic solution Place 2 drops into the right ear every 4 (four) hours while awake. 04/17/16   Triplett, Johnette Abraham B, FNP  cyclobenzaprine (FLEXERIL) 10 MG tablet Take 1 tablet (10 mg total) by mouth 3 (three) times daily as needed for muscle spasms. 07/05/17   Seferina Brokaw, Charline Bills, PA-C  fluticasone (FLONASE) 50 MCG/ACT nasal spray Place 1 spray into both nostrils 2 (two) times daily. 12/02/17   Lavora Brisbon, Charline Bills, PA-C  guaiFENesin (MUCINEX) 600 MG 12 hr tablet Take 600 mg by mouth every 12 (twelve) hours.    [provider]  HYDROcodone-acetaminophen (NORCO/VICODIN) 5-325 MG tablet Take 1 tablet by mouth every 6 (six) hours as needed for moderate pain. 04/17/16   Triplett, Johnette Abraham B, FNP  meloxicam (MOBIC) 15 MG tablet Take 1 tablet (15 mg total) by mouth daily. 07/05/17   Karan Ramnauth, Charline Bills, PA-C  naproxen (NAPROSYN) 500 MG tablet Take 1 tablet (500 mg total) by mouth 2 (two) times daily with a meal. 08/27/17   Sable Feil, PA-C  traMADol (ULTRAM) 50 MG tablet Take 1 tablet (50 mg total) by mouth every 6 (six) hours as needed for moderate pain. 08/27/17   Sable Feil, PA-C    Allergies Patient has no known allergies.  Family History  Problem Relation Age of Onset  . Diabetes Mother   . Cancer Father     Social History Social History   Tobacco Use  . Smoking status: Current Every Day Smoker    Packs/day: 1.00    Years: 5.00    Pack years: 5.00    Types: Cigarettes  . Smokeless tobacco: Never Used  Substance Use Topics  . Alcohol use: Yes    Alcohol/week: 4.2 oz    Types: 2 Cans of beer, 5 Shots of liquor per week  . Drug use: No     Review of  Systems  Constitutional: No fever/chills Eyes: No visual changes. No discharge ENT: positive for right ear pressure and fullness, nasal congestion, sinus pressure, sore throat. Cardiovascular: no chest pain. Respiratory: Positive cough.  One episode of hemoptysis, described as spots of blood.  No SOB. Gastrointestinal: No abdominal pain.  No nausea, no vomiting.  No diarrhea.  No constipation. Musculoskeletal: Negative for musculoskeletal pain. Skin: Negative for rash, abrasions, lacerations, ecchymosis. Neurological: Negative for headaches, focal weakness or numbness. 10-point ROS otherwise negative.  ____________________________________________   PHYSICAL EXAM:  VITAL SIGNS: ED Triage Vitals  Enc Vitals Group     BP 12/02/17 1449 139/85     Pulse Rate 12/02/17 1449 83     Resp 12/02/17 1449 16     Temp 12/02/17 1449 97.9 F (36.6 C)     Temp Source 12/02/17 1449 Oral     SpO2 12/02/17 1449 99 %     Weight 12/02/17 1447 280 lb (127 kg)     Height 12/02/17 1447 6' (1.829 m)     Head Circumference --      Peak Flow --      Pain Score --      Pain Loc --      Pain Edu? --      Excl. in [place]? --      Constitutional: Alert and oriented. Well appearing and in no acute distress. Eyes: Conjunctivae are normal. PERRL. EOMI. Head: Atraumatic. ENT:      Ears: EACs unremarkable bilaterally.  TM on right is bulging but is not dusky and no air-fluid level.  TM on left is very minimally bulging with no duskiness or air-fluid level.      Nose: Moderate congestion/rhinnorhea.  Turbinates are erythematous and edematous.  Patient is tender percussion over bilateral maxillary sinuses.      Mouth/Throat: Mucous membranes are moist.  Oropharynx is slightly erythematous but non-erythematous.  Uvula is midline.  Tonsils are mildly erythematous but nonedematous and no exudates. Neck: No stridor.  Is supple full range of motion Hematological/Lymphatic/Immunilogical: No cervical  lymphadenopathy. Cardiovascular: Normal rate, regular rhythm. Normal S1 and S2.  Good peripheral circulation. Respiratory: Normal respiratory effort without tachypnea or retractions. Lungs CTA on right side.  Patient with crackles and a few scattered rales to the left lower lung field.  No wheezing.  Good air entry to the bases with no decreased or absent breath sounds. Musculoskeletal: Full range of motion to all extremities. No gross deformities appreciated. Neurologic:  Normal speech and language. No gross focal neurologic deficits are appreciated.  Skin:  Skin is warm, dry and intact. No rash noted. Psychiatric: Mood and affect are normal. Speech and behavior are normal. Patient exhibits appropriate insight and judgement.   ____________________________________________   LABS (all labs ordered are listed, but only abnormal results are displayed)  Labs Reviewed - No data to display ____________________________________________  EKG   ____________________________________________  RADIOLOGY I, Charline Bills Lailoni Baquera, personally viewed and evaluated these images (plain radiographs) as part of my medical decision making, as well as reviewing the written report by the radiologist.  Dg Chest 2 View  Result Date: 12/02/2017 CLINICAL DATA:  Cough and crackles EXAM: CHEST  2 VIEW COMPARISON:  02/27/2015 FINDINGS: The heart size and mediastinal contours are within normal limits. Both lungs are clear. The visualized skeletal structures are unremarkable. IMPRESSION: No active cardiopulmonary disease. Electronically Signed   By: Franchot Gallo M.D.   On: 12/02/2017 15:34    ____________________________________________    PROCEDURES  Procedure(s) performed:    Procedures    Medications - No data to display   ____________________________________________   INITIAL IMPRESSION / ASSESSMENT AND PLAN / ED COURSE  Pertinent labs & imaging results that were available during my care of the  patient were reviewed by me and considered in my medical decision making (see chart for details).  Review of the [place] CSRS was performed in accordance of the [place] prior to dispensing any controlled drugs.  Clinical Course as of Dec 02 1541  Mon Dec 02, 2017  1516 Patient presented with 1-2-week history of right ear fullness.  Patient developed nasal congestion, sinus pressure, sore throat, cough for the past 2-3 days.  Patient had a coughing episode that he described as severe with one episode of hemoptysis.  At this time, patient is stable in no apparent distress.  No history of fevers.  Based off my exam, patient does have eustachian tube dysfunction, likely secondary to bacterial sinusitis at this time.  Patient has crackles and scattered rales to the left lower lung field.  Chest x-ray ordered to evaluate for possible pneumonia.  Initial differential included otitis media, otitis externa, cerumen impaction, bacterial sinusitis, URI, strep, bronchitis, pneumonia, TB.  [JC]    Clinical Course User Index [JC] Donnielle Addison, Charline Bills, PA-C    Patient's diagnosis is consistent with sinusitis with right-sided eustachian tube dysfunction and cough.  Patient's exam is reassuring.  Chest x-ray reveals no consolidation consistent with pneumonia.  Patient will be treated with antibiotics for sinusitis, Flonase and Zyrtec for eustachian tube dysfunction, Bromfed cough syrup for cough..  Patient is to follow up with primary care as needed or otherwise directed. Patient is given ED precautions to return to the ED for any worsening or new symptoms.     ____________________________________________  FINAL CLINICAL IMPRESSION(S) / ED DIAGNOSES  Final diagnoses:  Acute non-recurrent maxillary sinusitis  Eustachian tube dysfunction, right  Cough      NEW MEDICATIONS STARTED DURING THIS VISIT:  ED Discharge Orders        Ordered    fluticasone (FLONASE) 50 MCG/ACT nasal spray  2 times daily      12/02/17 1542    cetirizine (ZYRTEC) 10 MG tablet  Daily     12/02/17 1542    amoxicillin-clavulanate (AUGMENTIN) 875-125 MG tablet  2 times daily     12/02/17 1542    brompheniramine-pseudoephedrine-DM 30-2-10 MG/5ML syrup  4 times daily PRN     12/02/17 1542          This chart was dictated using voice recognition software/Dragon. Despite best efforts to proofread, errors can occur which can change the meaning. Any change was purely unintentional.    Darletta Moll, PA-C 12/02/17 1544    Schuyler Amor, MD 12/02/17 2340

## 2018-05-28 ENCOUNTER — Encounter: Payer: Self-pay | Admitting: Emergency Medicine

## 2018-05-28 ENCOUNTER — Emergency Department
Admission: EM | Admit: 2018-05-28 | Discharge: 2018-05-28 | Disposition: A | Discharge status: Home / Self Care | Payer: PRIVATE HEALTH INSURANCE | Attending: Emergency Medicine | Admitting: Emergency Medicine

## 2018-05-28 DIAGNOSIS — J45909 Unspecified asthma, uncomplicated: Secondary | ICD-10-CM | POA: Insufficient documentation

## 2018-05-28 DIAGNOSIS — K047 Periapical abscess without sinus: Secondary | ICD-10-CM

## 2018-05-28 DIAGNOSIS — Z79899 Other long term (current) drug therapy: Secondary | ICD-10-CM | POA: Insufficient documentation

## 2018-05-28 DIAGNOSIS — F17210 Nicotine dependence, cigarettes, uncomplicated: Secondary | ICD-10-CM | POA: Insufficient documentation

## 2018-05-28 MED ORDER — LIDOCAINE VISCOUS HCL 2 % MT SOLN: 10.0000 mL | OROMUCOSAL | 0 refills | Status: DC | PRN

## 2018-05-28 MED ORDER — AMOXICILLIN 500 MG PO CAPS: 500.0000 mg | ORAL_CAPSULE | Freq: Three times a day (TID) | ORAL | 0 refills | Status: DC

## 2018-05-28 NOTE — ED Triage Notes (Signed)
Pt reports right lower toothache since Monday.

## 2018-05-28 NOTE — ED Provider Notes (Signed)
[facility] Emergency Department Provider Note  ____________________________________________  Time seen: Approximately 11:03 AM  I have reviewed the triage vital signs and the nursing notes.   HISTORY  Chief Complaint Dental Pain    HPI Logan Lucas is a 42 y.o. male that presents to the  emergency department presents to the emergency department for evaluation of right bottom dental pain for 2 days. He states that pain is over one of his cracked teeth. He has many cracked teeth and cavities. Patient does not have a dentist. No fever, chills, nausea, vomiting.   Past Medical History:  Diagnosis Date  . Asthma     There are no active problems to display for this patient.   History reviewed. No pertinent surgical history.  Prior to Admission medications   Medication Sig Start Date End Date Taking? Authorizing Provider  albuterol (PROVENTIL HFA;VENTOLIN HFA) 108 (90 Base) MCG/ACT inhaler Inhale 2 puffs into the lungs every 6 (six) hours as needed for wheezing or shortness of breath. 02/06/16   [name], MD  amoxicillin (AMOXIL) 500 MG capsule Take 1 capsule (500 mg total) by mouth 3 (three) times daily. 05/28/18   Laban Emperor, PA-C  amoxicillin-clavulanate (AUGMENTIN) 875-125 MG tablet Take 1 tablet by mouth 2 (two) times daily. 12/02/17   Cuthriell, Charline Bills, PA-C  brompheniramine-pseudoephedrine-DM 30-2-10 MG/5ML syrup Take 10 mLs by mouth 4 (four) times daily as needed. 12/02/17   Cuthriell, Charline Bills, PA-C  carisoprodol (SOMA) 350 MG tablet Take 1 tablet (350 mg total) by mouth 3 (three) times daily as needed for muscle spasms. Patient not taking: Reported on 02/06/2016 12/16/15   Orbie Pyo, MD  cetirizine (ZYRTEC) 10 MG tablet Take 1 tablet (10 mg total) by mouth daily. 12/02/17   Cuthriell, Charline Bills, PA-C  ciprofloxacin (CILOXAN) 0.3 % ophthalmic solution Place 2 drops into the right ear every 4 (four) hours while awake. 04/17/16    Triplett, Johnette Abraham B, FNP  cyclobenzaprine (FLEXERIL) 10 MG tablet Take 1 tablet (10 mg total) by mouth 3 (three) times daily as needed for muscle spasms. 07/05/17   Cuthriell, Charline Bills, PA-C  fluticasone (FLONASE) 50 MCG/ACT nasal spray Place 1 spray into both nostrils 2 (two) times daily. 12/02/17   Cuthriell, Charline Bills, PA-C  guaiFENesin (MUCINEX) 600 MG 12 hr tablet Take 600 mg by mouth every 12 (twelve) hours.    [provider]  HYDROcodone-acetaminophen (NORCO/VICODIN) 5-325 MG tablet Take 1 tablet by mouth every 6 (six) hours as needed for moderate pain. 04/17/16   Triplett, Cari B, FNP  lidocaine (XYLOCAINE) 2 % solution Use as directed 10 mLs in the mouth or throat as needed for mouth pain. 05/28/18   Laban Emperor, PA-C  meloxicam (MOBIC) 15 MG tablet Take 1 tablet (15 mg total) by mouth daily. 07/05/17   Cuthriell, Charline Bills, PA-C  naproxen (NAPROSYN) 500 MG tablet Take 1 tablet (500 mg total) by mouth 2 (two) times daily with a meal. 08/27/17   Sable Feil, PA-C  traMADol (ULTRAM) 50 MG tablet Take 1 tablet (50 mg total) by mouth every 6 (six) hours as needed for moderate pain. 08/27/17   Sable Feil, PA-C    Allergies Patient has no known allergies.  Family History  Problem Relation Age of Onset  . Diabetes Mother   . Cancer Father     Social History Social History   Tobacco Use  . Smoking status: Current Every Day Smoker    Packs/day: 1.00  Years: 5.00    Pack years: 5.00    Types: Cigarettes  . Smokeless tobacco: Never Used  Substance Use Topics  . Alcohol use: Yes    Alcohol/week: 4.2 oz    Types: 2 Cans of beer, 5 Shots of liquor per week  . Drug use: No    Frequency: 1.0 times per week     Review of Systems  Constitutional: No fever/chills Respiratory: No SOB. Gastrointestinal: No nausea, no vomiting.  Musculoskeletal: Negative for musculoskeletal pain. Skin: Negative for rash, abrasions, lacerations, ecchymosis. Neurological: Negative for  headaches   ____________________________________________   PHYSICAL EXAM:  VITAL SIGNS: ED Triage Vitals  Enc Vitals Group     BP 05/28/18 1045 (!) 146/91     Pulse Rate 05/28/18 1045 82     Resp 05/28/18 1045 20     Temp 05/28/18 1045 98.2 F (36.8 C)     Temp Source 05/28/18 1045 Oral     SpO2 05/28/18 1045 97 %     Weight 05/28/18 1046 286 lb (129.7 kg)     Height 05/28/18 1046 5\' 9"  (1.753 m)     Head Circumference --      Peak Flow --      Pain Score 05/28/18 1046 10     Pain Loc --      Pain Edu? --      Excl. in [place]? --      Constitutional: Alert and oriented. Well appearing and in no acute distress. Eyes: Conjunctivae are normal. PERRL. EOMI. Head: Atraumatic. ENT:      Ears:      Nose: No congestion/rhinnorhea.      Mouth/Throat: Mucous membranes are moist.  Poor dentition.  Tenderness to palpation over bottom right canine. No visible swelling.  No palpable abscess. Neck: No stridor.   Cardiovascular: Normal rate, regular rhythm.  Good peripheral circulation. Respiratory: Normal respiratory effort without tachypnea or retractions. Lungs CTAB. Good air entry to the bases with no decreased or absent breath sounds.   Musculoskeletal: Full range of motion to all extremities. No gross deformities appreciated. Neurologic:  Normal speech and language. No gross focal neurologic deficits are appreciated.  Skin:  Skin is warm, dry and intact. No rash noted. Psychiatric: Mood and affect are normal. Speech and behavior are normal. Patient exhibits appropriate insight and judgement.   ____________________________________________   LABS (all labs ordered are listed, but only abnormal results are displayed)  Labs Reviewed - No data to display ____________________________________________  EKG   ____________________________________________  RADIOLOGY   No results found.  ____________________________________________    PROCEDURES  Procedure(s) performed:     Procedures    Medications - No data to display   ____________________________________________   INITIAL IMPRESSION / ASSESSMENT AND PLAN / ED COURSE  Pertinent labs & imaging results that were available during my care of the patient were reviewed by me and considered in my medical decision making (see chart for details).  Review of the [place] CSRS was performed in accordance of the [place] prior to dispensing any controlled drugs.   Patient's diagnosis is consistent with dental abscess.  Vital signs and exam are reassuring.  Dental resources were provided.  Patient will be discharged home with prescriptions for amoxicillin, viscous lidocaine. Patient is to follow up with dentist as directed. Patient is given ED precautions to return to the ED for any worsening or new symptoms.   ____________________________________________  FINAL CLINICAL IMPRESSION(S) / ED DIAGNOSES  Final diagnoses:  Dental abscess  NEW MEDICATIONS STARTED DURING THIS VISIT:  ED Discharge Orders        Ordered    amoxicillin (AMOXIL) 500 MG capsule  3 times daily     05/28/18 1111    lidocaine (XYLOCAINE) 2 % solution  As needed     05/28/18 1111          This chart was dictated using voice recognition software/Dragon. Despite best efforts to proofread, errors can occur which can change the meaning. Any change was purely unintentional.    Laban Emperor, PA-C 05/28/18 1545    Earleen Newport, MD 05/30/18 1455

## 2018-05-28 NOTE — Discharge Instructions (Addendum)
OPTIONS FOR DENTAL FOLLOW UP CARE ° °[place] Department of Health and Human Services - Local Safety Net Dental Clinics °http://www.ncdhhs.gov/dph/oralhealth/services/safetynetclinics.htm °  °[facility] (336-562-3123) ° °[facility] [place] (919-933-9087) ° °[facility] ([phone] ext [phone]) ° °[facility] ([phone]) ° °[facility] ([phone]) °This clinic caters to the indigent population and is on a lottery system. °Location: °[facility], [address], [place] °Clinic Hours: °Wednesdays from 6pm - 9pm, patients seen by a lottery system. °For dates, call or go to www.med.[facility].edu/[facility]/patients/Dental-[facility] °Services: °Cleanings, fillings and simple extractions. °Payment Options: °DENTAL WORK IS FREE OF CHARGE. Bring proof of income or support. °Best way to get seen: °Arrive at 5:15 pm - this is a lottery, NOT first come/first serve, so arriving earlier will not increase your chances of being seen. °  °  °[facility] °[phone] °Select option 1 for emergencies °  °Location: °[facility], [address], [place] °Clinic Hours: °No walk-ins accepted - call the day before to schedule an appointment. °Check in times are 9:30 am and 1:30 pm. °Services: °Simple extractions, temporary fillings, pulpectomy/pulp debridement, uncomplicated abscess drainage. °Payment Options: °PAYMENT IS DUE AT THE TIME OF SERVICE.  Fee is usually $100-200, additional surgical procedures (e.g. abscess drainage) may be extra. °Cash, checks, Visa/MasterCard accepted.  Can file Medicaid if patient is covered for dental - patient should call case worker to check. °No discount for [facility] patients. °Best way to get seen: °MUST call the day before and get onto the schedule. Can usually be seen the next 1-2 days. No walk-ins accepted. °  °  °[facility] °[phone] °   °Location: °[facility], [address], [place] °Clinic Hours: °M, W, Th, F 8am or 1:30pm, Tues 9a or 1:30 - first come/first served. °Services: °Simple extractions, temporary fillings, uncomplicated abscess drainage.  You do not need to be an [place] resident. °Payment Options: °PAYMENT IS DUE AT THE TIME OF SERVICE. °Dental insurance, otherwise sliding scale - bring proof of income or support. °Depending on income and treatment needed, cost is usually $50-200. °Best way to get seen: °Arrive early as it is first come/first served. °  °  °[facility] °[phone] °  °Location: °[address] °Clinic Hours: °Mon-Thu 8a-5p °Services: °Most basic dental services including extractions and fillings. °Payment Options: °PAYMENT IS DUE AT THE TIME OF SERVICE. °Sliding scale, up to 50% off - bring proof if income or support. °Medicaid with dental option accepted. °Best way to get seen: °Call to schedule an appointment, can usually be seen within 2 weeks OR they will try to see walk-ins - show up at 8a or 2p (you may have to wait). °  °  °[facility] °919-245-2435 °[place] RESIDENTS ONLY °  °Location: °[facility], [address], [place],  [address] °Clinic Hours: By appointment only. °Monday - Thursday 8am-5pm, Friday 8am-12pm °Services: Cleanings, fillings, extractions. °Payment Options: °PAYMENT IS DUE AT THE TIME OF SERVICE. °Cash, Visa or MasterCard. Sliding scale - $30 minimum per service. °Best way to get seen: °Come in to office, complete packet and make an appointment - need proof of income °or support monies for each household member and proof of [place] residence. °Usually takes about a month to get in. °  °  °[facility] °[phone] °  °Location: °[address]   [place] °Clinic Hours: Walk-in Urgent Care Dental Services are offered Monday-Friday  mornings only. °The numbers of emergencies accepted daily is limited to the number of °providers available. °Maximum 15 - Mondays, Wednesdays & Thursdays °Maximum 10 - Tuesdays & Fridays °Services: °You do not need to be a [place] resident to be seen for a dental emergency. °Emergencies are defined as pain, swelling, abnormal bleeding, or dental trauma. Walkins will receive x-rays if needed. °NOTE: Dental cleaning is not an emergency. °Payment Options: °PAYMENT IS DUE AT THE TIME OF SERVICE. °Minimum co-pay is $40.00 for uninsured patients. °Minimum co-pay is $3.00 for Medicaid with dental coverage. °Dental Insurance is accepted and must be presented at time of visit. °Medicare does not cover dental. °Forms of payment: Cash, credit card, checks. °Best way to get seen: °If not previously registered with the clinic, walk-in dental registration begins at 7:15 am and is on a first come/first serve basis. °If previously registered with the clinic, call to make an appointment. °  °  °[facility] °[phone] °[place] RESIDENTS ONLY °  °Location: °[address], [place], [place] °Clinic Hours: °Mon-Thu 10a-2p °Services: Extractions only! °Payment Options: °FREE (donations accepted) - bring proof of income or support °Best way to get seen: °Call and schedule an appointment OR come at 8am on the 1st Monday of every month (except for holidays) when it is first come/first served. °  °  °[facility] °[phone] °  °Location: °[address], [place] °Clinic Hours: °Friday mornings °Services, Payment Options, Best way to get seen: °Call for info °

## 2018-05-28 NOTE — ED Notes (Signed)
See triage note  Presents with possible dental abscess to right lower gumline

## 2018-06-17 ENCOUNTER — Other Ambulatory Visit: Payer: Self-pay

## 2018-06-17 ENCOUNTER — Encounter: Payer: Self-pay | Admitting: Emergency Medicine

## 2018-06-17 ENCOUNTER — Emergency Department: Payer: Self-pay

## 2018-06-17 ENCOUNTER — Observation Stay
Admission: EM | Admit: 2018-06-17 | Discharge: 2018-06-19 | Disposition: A | Discharge status: Home / Self Care | Payer: Self-pay | Attending: Internal Medicine | Admitting: Internal Medicine

## 2018-06-17 DIAGNOSIS — Z833 Family history of diabetes mellitus: Secondary | ICD-10-CM | POA: Insufficient documentation

## 2018-06-17 DIAGNOSIS — N289 Disorder of kidney and ureter, unspecified: Secondary | ICD-10-CM | POA: Insufficient documentation

## 2018-06-17 DIAGNOSIS — E785 Hyperlipidemia, unspecified: Secondary | ICD-10-CM | POA: Insufficient documentation

## 2018-06-17 DIAGNOSIS — Z6841 Body Mass Index (BMI) 40.0 and over, adult: Secondary | ICD-10-CM | POA: Insufficient documentation

## 2018-06-17 DIAGNOSIS — I10 Essential (primary) hypertension: Secondary | ICD-10-CM | POA: Insufficient documentation

## 2018-06-17 DIAGNOSIS — R0602 Shortness of breath: Secondary | ICD-10-CM | POA: Insufficient documentation

## 2018-06-17 DIAGNOSIS — E669 Obesity, unspecified: Secondary | ICD-10-CM | POA: Insufficient documentation

## 2018-06-17 DIAGNOSIS — R911 Solitary pulmonary nodule: Secondary | ICD-10-CM | POA: Insufficient documentation

## 2018-06-17 DIAGNOSIS — F17210 Nicotine dependence, cigarettes, uncomplicated: Secondary | ICD-10-CM | POA: Insufficient documentation

## 2018-06-17 DIAGNOSIS — R079 Chest pain, unspecified: Principal | ICD-10-CM | POA: Diagnosis present

## 2018-06-17 LAB — CBC
HEMATOCRIT: 43.4 % (ref 40.0–52.0)
HEMATOCRIT: 42.8 % (ref 40.0–52.0)
HEMOGLOBIN: 14.3 g/dL (ref 13.0–18.0)
HEMOGLOBIN: 14.3 g/dL (ref 13.0–18.0)
MCH: 28.3 pg (ref 26.0–34.0)
MCH: 28.8 pg (ref 26.0–34.0)
MCHC: 32.8 g/dL (ref 32.0–36.0)
MCHC: 33.4 g/dL (ref 32.0–36.0)
MCV: 86.2 fL (ref 80.0–100.0)
MCV: 86.4 fL (ref 80.0–100.0)
PLATELETS: 235 10*3/uL (ref 150–440)
Platelets: 247 10*3/uL (ref 150–440)
RBC: 5.04 MIL/uL (ref 4.40–5.90)
RBC: 4.95 MIL/uL (ref 4.40–5.90)
RDW: 14.9 % — ABNORMAL HIGH (ref 11.5–14.5)
RDW: 15.2 % — ABNORMAL HIGH (ref 11.5–14.5)
WBC: 9.7 10*3/uL (ref 3.8–10.6)
WBC: 9.2 10*3/uL (ref 3.8–10.6)

## 2018-06-17 LAB — TROPONIN I: Troponin I: <0.03 ng/mL (ref <0.03)

## 2018-06-17 LAB — BASIC METABOLIC PANEL
ANION GAP: 6 (ref 5–15)
BUN: 14 mg/dL (ref 6–20)
CO2: 28 mmol/L (ref 22–32)
Calcium: 9.2 mg/dL (ref 8.9–10.3)
Chloride: 105 mmol/L (ref 98–111)
Creatinine, Ser: 1.28 mg/dL — ABNORMAL HIGH (ref 0.61–1.24)
GFR calc Af Amer: >60 mL/min (ref >60)
GLUCOSE: 145 mg/dL — AB (ref 70–99)
POTASSIUM: 3.7 mmol/L (ref 3.5–5.1)
Sodium: 139 mmol/L (ref 135–145)

## 2018-06-17 LAB — CREATININE, SERUM
Creatinine, Ser: 1.02 mg/dL (ref 0.61–1.24)
GFR calc non Af Amer: >60 mL/min (ref >60)

## 2018-06-17 MED ORDER — SODIUM CHLORIDE 0.9% FLUSH: 3.0000 mL | INTRAVENOUS | Status: DC | PRN

## 2018-06-17 MED ORDER — IOPAMIDOL (ISOVUE-370) INJECTION 76%
100.0000 mL | Freq: Once | INTRAVENOUS | Status: AC | PRN
  Administered 2018-06-17: 100 mL via INTRAVENOUS

## 2018-06-17 MED ORDER — ASPIRIN 300 MG RE SUPP: 300.0000 mg | RECTAL | Status: AC

## 2018-06-17 MED ORDER — NITROGLYCERIN 0.4 MG SL SUBL
0.4000 mg | SUBLINGUAL_TABLET | SUBLINGUAL | Status: DC | PRN
  Administered 2018-06-17 – 2018-06-19 (×2): 0.4 mg via SUBLINGUAL
  Filled 2018-06-17 (×2): qty 1

## 2018-06-17 MED ORDER — ENOXAPARIN SODIUM 40 MG/0.4ML ~~LOC~~ SOLN
40.0000 mg | Freq: Two times a day (BID) | SUBCUTANEOUS | Status: DC
  Administered 2018-06-18 (×2): 40 mg via SUBCUTANEOUS
  Filled 2018-06-17 (×3): qty 0.4

## 2018-06-17 MED ORDER — ACETAMINOPHEN 325 MG PO TABS
650.0000 mg | ORAL_TABLET | ORAL | Status: DC | PRN
  Administered 2018-06-18: 650 mg via ORAL
  Filled 2018-06-17 (×2): qty 2

## 2018-06-17 MED ORDER — ASPIRIN 81 MG PO CHEW
324.0000 mg | CHEWABLE_TABLET | Freq: Once | ORAL | Status: AC
  Administered 2018-06-17: 324 mg via ORAL
  Filled 2018-06-17: qty 4

## 2018-06-17 MED ORDER — NITROGLYCERIN 0.4 MG SL SUBL: 0.4000 mg | SUBLINGUAL_TABLET | SUBLINGUAL | Status: DC | PRN

## 2018-06-17 MED ORDER — SODIUM CHLORIDE 0.9% FLUSH
3.0000 mL | Freq: Two times a day (BID) | INTRAVENOUS | Status: DC
  Administered 2018-06-17 – 2018-06-19 (×4): 3 mL via INTRAVENOUS

## 2018-06-17 MED ORDER — ONDANSETRON HCL 4 MG/2ML IJ SOLN: 4.0000 mg | Freq: Four times a day (QID) | INTRAMUSCULAR | Status: DC | PRN

## 2018-06-17 MED ORDER — SODIUM CHLORIDE 0.9 % IV SOLN: 250.0000 mL | INTRAVENOUS | Status: DC | PRN

## 2018-06-17 MED ORDER — METOPROLOL TARTRATE 25 MG PO TABS
12.5000 mg | ORAL_TABLET | Freq: Two times a day (BID) | ORAL | Status: DC
Start: 2018-06-17 — End: 2018-06-19
  Administered 2018-06-17 – 2018-06-19 (×4): 12.5 mg via ORAL
  Filled 2018-06-17 (×4): qty 1

## 2018-06-17 MED ORDER — ASPIRIN 81 MG PO CHEW: 324.0000 mg | CHEWABLE_TABLET | ORAL | Status: AC

## 2018-06-17 MED ORDER — ASPIRIN EC 81 MG PO TBEC
81.0000 mg | DELAYED_RELEASE_TABLET | Freq: Every day | ORAL | Status: DC
  Administered 2018-06-18 – 2018-06-19 (×2): 81 mg via ORAL
  Filled 2018-06-17 (×2): qty 1

## 2018-06-17 MED ORDER — MORPHINE SULFATE (PF) 2 MG/ML IV SOLN: 2.0000 mg | INTRAVENOUS | Status: DC | PRN

## 2018-06-17 NOTE — ED Notes (Signed)
Called and spoke with [facility] and she stated they would call back shortly.

## 2018-06-17 NOTE — ED Notes (Signed)
Family at bedside. Floor called prior to leaving the ED

## 2018-06-17 NOTE — ED Provider Notes (Signed)
[facility] Emergency Department Provider Note  ____________________________________________  Time seen: Approximately 6:16 PM  I have reviewed the triage vital signs and the nursing notes.   HISTORY  Chief Complaint Chest Pain   HPI Logan Lucas is a 42 y.o. male with history of asthma and smoking who presents for evaluation of chest pain.  Patient reports chest pain in the left side of his chest that he describes as tightness but also sharp, radiating to his left scapula and associated with shortness of breath.  The pain is moderate in intensity at this time.  He reports that exertion makes shortness of breath worse but not necessarily changes the pain.  He reports that the pain has been constant since this morning.  He denies having similar pain in the past.  No diaphoresis, nausea, vomiting, dizziness.  He smokes half a pack a day.  He denies personal or family history of ischemic heart disease, blood clots, recent travel immobilization, leg pain or swelling, hemoptysis, or exogenous hormones.  Patient denies wheezing, cough or fever.  Past Medical History:  Diagnosis Date  . Asthma     Prior to Admission medications   Medication Sig Start Date End Date Taking? Authorizing Provider  albuterol (PROVENTIL HFA;VENTOLIN HFA) 108 (90 Base) MCG/ACT inhaler Inhale 2 puffs into the lungs every 6 (six) hours as needed for wheezing or shortness of breath. 02/06/16   [name], MD  amoxicillin (AMOXIL) 500 MG capsule Take 1 capsule (500 mg total) by mouth 3 (three) times daily. 05/28/18   Laban Emperor, PA-C  amoxicillin-clavulanate (AUGMENTIN) 875-125 MG tablet Take 1 tablet by mouth 2 (two) times daily. 12/02/17   Cuthriell, Charline Bills, PA-C  brompheniramine-pseudoephedrine-DM 30-2-10 MG/5ML syrup Take 10 mLs by mouth 4 (four) times daily as needed. 12/02/17   Cuthriell, Charline Bills, PA-C  carisoprodol (SOMA) 350 MG tablet Take 1 tablet (350 mg total) by mouth 3  (three) times daily as needed for muscle spasms. Patient not taking: Reported on 02/06/2016 12/16/15   Orbie Pyo, MD  cetirizine (ZYRTEC) 10 MG tablet Take 1 tablet (10 mg total) by mouth daily. 12/02/17   Cuthriell, Charline Bills, PA-C  ciprofloxacin (CILOXAN) 0.3 % ophthalmic solution Place 2 drops into the right ear every 4 (four) hours while awake. 04/17/16   Triplett, Johnette Abraham B, FNP  cyclobenzaprine (FLEXERIL) 10 MG tablet Take 1 tablet (10 mg total) by mouth 3 (three) times daily as needed for muscle spasms. 07/05/17   Cuthriell, Charline Bills, PA-C  fluticasone (FLONASE) 50 MCG/ACT nasal spray Place 1 spray into both nostrils 2 (two) times daily. 12/02/17   Cuthriell, Charline Bills, PA-C  guaiFENesin (MUCINEX) 600 MG 12 hr tablet Take 600 mg by mouth every 12 (twelve) hours.    [provider]  HYDROcodone-acetaminophen (NORCO/VICODIN) 5-325 MG tablet Take 1 tablet by mouth every 6 (six) hours as needed for moderate pain. 04/17/16   Triplett, Cari B, FNP  lidocaine (XYLOCAINE) 2 % solution Use as directed 10 mLs in the mouth or throat as needed for mouth pain. 05/28/18   Laban Emperor, PA-C  meloxicam (MOBIC) 15 MG tablet Take 1 tablet (15 mg total) by mouth daily. 07/05/17   Cuthriell, Charline Bills, PA-C  naproxen (NAPROSYN) 500 MG tablet Take 1 tablet (500 mg total) by mouth 2 (two) times daily with a meal. 08/27/17   Sable Feil, PA-C  traMADol (ULTRAM) 50 MG tablet Take 1 tablet (50 mg total) by mouth every 6 (six)  hours as needed for moderate pain. 08/27/17   Sable Feil, PA-C    Allergies Patient has no known allergies.  Family History  Problem Relation Age of Onset  . Diabetes Mother   . Cancer Father     Social History Social History   Tobacco Use  . Smoking status: Current Every Day Smoker    Packs/day: 1.00    Years: 5.00    Pack years: 5.00    Types: Cigarettes  . Smokeless tobacco: Never Used  Substance Use Topics  . Alcohol use: Yes    Alcohol/week: 4.2 oz      Types: 2 Cans of beer, 5 Shots of liquor per week  . Drug use: No    Frequency: 1.0 times per week    Review of Systems  Constitutional: Negative for fever. Eyes: Negative for visual changes. ENT: Negative for sore throat. Neck: No neck pain  Cardiovascular: + chest pain. Respiratory: +shortness of breath. Gastrointestinal: Negative for abdominal pain, vomiting or diarrhea. Genitourinary: Negative for dysuria. Musculoskeletal: Negative for back pain. Skin: Negative for rash. Neurological: Negative for headaches, weakness or numbness. Psych: No SI or HI  ____________________________________________   PHYSICAL EXAM:  VITAL SIGNS: ED Triage Vitals  Enc Vitals Group     BP 06/17/18 1526 (!) 157/102     Pulse Rate 06/17/18 1526 96     Resp 06/17/18 1526 16     Temp 06/17/18 1526 98.2 F (36.8 C)     Temp Source 06/17/18 1526 Oral     SpO2 06/17/18 1526 97 %     Weight 06/17/18 1527 292 lb (132.5 kg)     Height 06/17/18 1527 5\' 9"  (1.753 m)     Head Circumference --      Peak Flow --      Pain Score 06/17/18 1527 8     Pain Loc --      Pain Edu? --      Excl. in [place]? --     Constitutional: Alert and oriented. Well appearing and in no apparent distress. HEENT:      Head: Normocephalic and atraumatic.         Eyes: Conjunctivae are normal. Sclera is non-icteric.       Mouth/Throat: Mucous membranes are moist.       Neck: Supple with no signs of meningismus. Cardiovascular: Regular rate and rhythm. No murmurs, gallops, or rubs. 2+ symmetrical distal pulses are present in all extremities. No JVD. Respiratory: Normal respiratory effort. Lungs are clear to auscultation bilaterally. No wheezes, crackles, or rhonchi.  Slightly decreased air movement bilaterally Gastrointestinal: Soft, non tender, and non distended with positive bowel sounds. No rebound or guarding. Musculoskeletal: Nontender with normal range of motion in all extremities. No edema, cyanosis, or erythema of  extremities. Neurologic: Normal speech and language. Face is symmetric. Moving all extremities. No gross focal neurologic deficits are appreciated. Skin: Skin is warm, dry and intact. No rash noted. Psychiatric: Mood and affect are normal. Speech and behavior are normal.  ____________________________________________   LABS (all labs ordered are listed, but only abnormal results are displayed)  Labs Reviewed  BASIC METABOLIC PANEL - Abnormal; Notable for the following components:      Result Value   Glucose, Bld 145 (*)    Creatinine, Ser 1.28 (*)    All other components within normal limits  CBC - Abnormal; Notable for the following components:   RDW 14.9 (*)    All other components within normal limits  TROPONIN I  TROPONIN I   ____________________________________________  EKG  ED ECG REPORT I, Rudene Re, the attending physician, personally viewed and interpreted this ECG.  Normal sinus rhythm, rate of 94, normal intervals, normal axis, T wave inversions in inferior leads, no ST elevations or depressions.  Unchanged from prior.  18:19 -normal sinus rhythm, rate of 74, normal intervals, normal axis, persistent T wave inversions in inferior leads with no ST elevations or depressions.  Unchanged from initial. ____________________________________________  RADIOLOGY  I have personally reviewed the images performed during this visit and I agree with the Radiologist's read.   Interpretation by Radiologist:  Dg Chest 2 View  Result Date: 06/17/2018 CLINICAL DATA:  42 year old male with increasing chest pain and shortness of breath for several weeks. Smoker. EXAM: CHEST - 2 VIEW COMPARISON:  Chest radiographs 12/02/2017 and earlier. FINDINGS: Lung volumes and mediastinal contours remain within normal limits. Visualized tracheal air column is within normal limits. Stable lung markings with no pneumothorax, pulmonary edema, pleural effusion or acute pulmonary opacity. Stable  mild increased pulmonary interstitial markings, likely related to smoking. Mild scoliosis. No acute osseous abnormality identified. Negative visible bowel gas pattern. IMPRESSION: No acute cardiopulmonary abnormality. Electronically Signed   By: Genevie Ann M.D.   On: 06/17/2018 15:57   Ct Angio Chest Aorta W And/or Wo Contrast  Result Date: 06/17/2018 CLINICAL DATA:  Worsening chest pain EXAM: CT ANGIOGRAPHY CHEST WITH CONTRAST TECHNIQUE: Multidetector CT imaging of the chest was performed using the standard protocol during bolus administration of intravenous contrast. Multiplanar CT image reconstructions and MIPs were obtained to evaluate the vascular anatomy. CONTRAST:  128mL ISOVUE-370 IOPAMIDOL (ISOVUE-370) INJECTION 76% COMPARISON:  Chest x-ray 06/17/2018 FINDINGS: Cardiovascular: Non contrasted images of the chest demonstrate no evidence for intramural hematoma. Nonaneurysmal aorta. No dissection seen. Pulsation artifact at the ascending aorta. Normal heart size. No pericardial effusion Mediastinum/Nodes: No enlarged mediastinal, hilar, or axillary lymph nodes. Thyroid gland, trachea, and esophagus demonstrate no significant findings. Lungs/Pleura: 4 mm right apical lung nodule, series 7, image number 23. No acute consolidation, pleural effusion or pneumothorax. Upper Abdomen: No acute abnormality. 18 mm left adrenal gland nodule with fat density consistent with adenoma. Diffuse nodularity of the right adrenal gland with 17 mm right adrenal gland fatty nodule. Musculoskeletal: No chest wall abnormality. No acute or significant osseous findings. Review of the MIP images confirms the above findings. IMPRESSION: 1. Negative for aortic aneurysm or aortic dissection. 2. 4 mm right apical lung nodule. No follow-up needed if patient is low-risk. Non-contrast chest CT can be considered in 12 months if patient is high-risk. This recommendation follows the consensus statement: Guidelines for Management of Incidental  Pulmonary Nodules Detected on CT Images: From the Fleischner Society 2017; Radiology 2017; 284:228-243. 3. Bilateral adrenal gland nodules, suspected to represent adenoma. Electronically Signed   By: Donavan Foil M.D.   On: 06/17/2018 18:51    ____________________________________________   PROCEDURES  Procedure(s) performed: None Procedures Critical Care performed:  None ____________________________________________   INITIAL IMPRESSION / ASSESSMENT AND PLAN / ED COURSE   42 y.o. male with history of asthma and smoking who presents for evaluation of constant L sided chest pain radiating to the scapula since this am and associated with SOB.  Patient is well-appearing, no distress, he is hypertensive but other vital signs are within normal limits, EKG shows no evidence of acute ischemia, patient has intact strong pulses in all 4 extremities, chest x-ray showing no evidence of pneumothorax or pneumonia, no widened  mediastinum.  Differential diagnoses including ACS versus dissection versus asthma/COPD exacerbation versus HTN urgency. First troponin is negative. Will repeat EKG, repeat troponin, and give ASA. Will send for CTA of the chest. If negative will try nitro and duoneb to see if patient's pain improves.   Clinical Course as of Jun 18 1955  Tue Jun 17, 2018  1956 CT angiogram is negative for PE or dissection.  Second troponin is negative.  Patient reports that his pain resolved after 1 sublingual nitro.Will admit for CP rule out   [CV]    Clinical Course User Index [CV] Alfred Levins [place], MD     As part of my medical decision making, I reviewed the following data within the [place] notes reviewed and incorporated, Labs reviewed , EKG interpreted , Old EKG reviewed, Old chart reviewed, Radiograph reviewed , Discussed with admitting physician , Notes from prior ED visits and [place] Controlled Substance Database    Pertinent labs & imaging results that were  available during my care of the patient were reviewed by me and considered in my medical decision making (see chart for details).    ____________________________________________   FINAL CLINICAL IMPRESSION(S) / ED DIAGNOSES  Final diagnoses:  Chest pain, unspecified type      NEW MEDICATIONS STARTED DURING THIS VISIT:  ED Discharge Orders    None       Note:  This document was prepared using Dragon voice recognition software and may include unintentional dictation errors.    Alfred Levins, [place], MD 06/17/18 [id number]

## 2018-06-17 NOTE — ED Notes (Signed)
Patient transported to CT 

## 2018-06-17 NOTE — ED Triage Notes (Signed)
Pt to ED via POV with c/o worsening CPand SOB xfew weeks. PT A&Ox4, VSS. PT also states his BP has been reading higher than normal. Pt ambulatory , NAD noted

## 2018-06-17 NOTE — ED Notes (Signed)
Attempted to call report multiple times and unable to get an answer

## 2018-06-17 NOTE — Progress Notes (Signed)
Lovenox changed to 40 mg BID for BMI >40 and CrCl >30. 

## 2018-06-17 NOTE — H&P (Signed)
[facility] [place] NAME: Logan Lucas    MR#:  [id number]  DATE OF BIRTH:  [birth year]  DATE OF ADMISSION:  06/17/2018  PRIMARY CARE PHYSICIAN: Patient, No Pcp Per   REQUESTING/REFERRING PHYSICIAN:   CHIEF COMPLAINT:   Chief Complaint  Patient presents with  . Chest Pain    HISTORY OF PRESENT ILLNESS: Logan Lucas  is a 42 y.o. male with a known history of bronchial asthma presented to the emergency room for chest pain.  The chest pain started yesterday evening when patient was doing his work.  He works in a [employer].  Chest pain is located in left-sided chest sharp in nature 6 out of 10 on a scale of 1-10.  When he came to emergency room his blood pressure was high and he was put on Nitropaste and blood pressure improved.  Troponin has been negative so far.  No prior cardiac work-up.  Patient in the emergency room was worked up with CTA chest which showed no pulmonary embolism but lung nodule .Hospitalist service was consulted for further care.  PAST MEDICAL HISTORY:   Past Medical History:  Diagnosis Date  . Asthma     PAST SURGICAL HISTORY:  Past Surgical History:  Procedure Laterality Date  . none      SOCIAL HISTORY:  Social History   Tobacco Use  . Smoking status: Current Every Day Smoker    Packs/day: 1.00    Years: 5.00    Pack years: 5.00    Types: Cigarettes  . Smokeless tobacco: Never Used  Substance Use Topics  . Alcohol use: Yes    Alcohol/week: 4.2 oz    Types: 2 Cans of beer, 5 Shots of liquor per week    FAMILY HISTORY:  Family History  Problem Relation Age of Onset  . Diabetes Mother   . Cancer Father     DRUG ALLERGIES: No Known Allergies  REVIEW OF SYSTEMS:   CONSTITUTIONAL: No fever, fatigue or weakness.  EYES: No blurred or double vision.  EARS, NOSE, AND THROAT: No tinnitus or ear pain.  RESPIRATORY: No cough, shortness of breath, wheezing or hemoptysis.   CARDIOVASCULAR: Has chest pain, no orthopnea, edema.  GASTROINTESTINAL: No nausea, vomiting, diarrhea or abdominal pain.  GENITOURINARY: No dysuria, hematuria.  ENDOCRINE: No polyuria, nocturia,  HEMATOLOGY: No anemia, easy bruising or bleeding SKIN: No rash or lesion. MUSCULOSKELETAL: No joint pain or arthritis.   NEUROLOGIC: No tingling, numbness, weakness.  PSYCHIATRY: No anxiety or depression.   MEDICATIONS AT HOME:  Prior to Admission medications   Medication Sig Start Date End Date Taking? Authorizing Provider  amoxicillin (AMOXIL) 500 MG capsule Take 1 capsule (500 mg total) by mouth 3 (three) times daily. Patient not taking: Reported on 06/17/2018 05/28/18   Laban Emperor, PA-C  lidocaine (XYLOCAINE) 2 % solution Use as directed 10 mLs in the mouth or throat as needed for mouth pain. Patient not taking: Reported on 06/17/2018 05/28/18   Laban Emperor, PA-C      PHYSICAL EXAMINATION:   VITAL SIGNS: Blood pressure (!) 137/94, pulse 73, temperature 98.2 F (36.8 C), temperature source Oral, resp. rate (!) 21, height 5\' 9"  (1.753 m), weight 132.5 kg (292 lb), SpO2 94 %.  GENERAL:  42 y.o.-year-old patient lying in the bed with no acute distress.  EYES: Pupils equal, round, reactive to light and accommodation. No scleral icterus. Extraocular muscles intact.  HEENT: Head atraumatic, normocephalic. Oropharynx and nasopharynx clear.  NECK:  Supple, no jugular venous distention. No thyroid enlargement, no tenderness.  LUNGS: Normal breath sounds bilaterally, no wheezing, rales,rhonchi or crepitation. No use of accessory muscles of respiration.  CARDIOVASCULAR: S1, S2 normal. No murmurs, rubs, or gallops.  ABDOMEN: Soft, nontender, nondistended. Bowel sounds present. No organomegaly or mass.  EXTREMITIES: No pedal edema, cyanosis, or clubbing.  NEUROLOGIC: Cranial nerves II through XII are intact. Muscle strength 5/5 in all extremities. Sensation intact. Gait not checked.  PSYCHIATRIC:  The patient is alert and oriented x 3.  SKIN: No obvious rash, lesion, or ulcer.   LABORATORY PANEL:   CBC Recent Labs  Lab 06/17/18 1530  WBC 9.7  HGB 14.3  HCT 43.4  PLT 247  MCV 86.2  MCH 28.3  MCHC 32.8  RDW 14.9*   ------------------------------------------------------------------------------------------------------------------  Chemistries  Recent Labs  Lab 06/17/18 1530  NA 139  K 3.7  CL 105  CO2 28  GLUCOSE 145*  BUN 14  CREATININE 1.28*  CALCIUM 9.2   ------------------------------------------------------------------------------------------------------------------ estimated creatinine clearance is 104.6 mL/min (A) (by C-G formula based on SCr of 1.28 mg/dL (H)). ------------------------------------------------------------------------------------------------------------------ No results for input(s): TSH, T4TOTAL, T3FREE, THYROIDAB in the last 72 hours.  Invalid input(s): FREET3   Coagulation profile No results for input(s): INR, PROTIME in the last 168 hours. ------------------------------------------------------------------------------------------------------------------- No results for input(s): DDIMER in the last 72 hours. -------------------------------------------------------------------------------------------------------------------  Cardiac Enzymes Recent Labs  Lab 06/17/18 1530 06/17/18 1824  TROPONINI <0.03 <0.03   ------------------------------------------------------------------------------------------------------------------ Invalid input(s): POCBNP  ---------------------------------------------------------------------------------------------------------------  Urinalysis    Component Value Date/Time   COLORURINE Yellow 02/10/2014 0521   APPEARANCEUR Hazy 02/10/2014 0521   LABSPEC 1.024 02/10/2014 0521   PHURINE 5.0 02/10/2014 0521   GLUCOSEU Negative 02/10/2014 0521   HGBUR Negative 02/10/2014 0521   BILIRUBINUR Negative  02/10/2014 0521   KETONESUR Trace 02/10/2014 0521   PROTEINUR Negative 02/10/2014 0521   NITRITE Negative 02/10/2014 0521   LEUKOCYTESUR Negative 02/10/2014 0521     RADIOLOGY: Dg Chest 2 View  Result Date: 06/17/2018 CLINICAL DATA:  42 year old male with increasing chest pain and shortness of breath for several weeks. Smoker. EXAM: CHEST - 2 VIEW COMPARISON:  Chest radiographs 12/02/2017 and earlier. FINDINGS: Lung volumes and mediastinal contours remain within normal limits. Visualized tracheal air column is within normal limits. Stable lung markings with no pneumothorax, pulmonary edema, pleural effusion or acute pulmonary opacity. Stable mild increased pulmonary interstitial markings, likely related to smoking. Mild scoliosis. No acute osseous abnormality identified. Negative visible bowel gas pattern. IMPRESSION: No acute cardiopulmonary abnormality. Electronically Signed   By: Genevie Ann M.D.   On: 06/17/2018 15:57   Ct Angio Chest Aorta W And/or Wo Contrast  Result Date: 06/17/2018 CLINICAL DATA:  Worsening chest pain EXAM: CT ANGIOGRAPHY CHEST WITH CONTRAST TECHNIQUE: Multidetector CT imaging of the chest was performed using the standard protocol during bolus administration of intravenous contrast. Multiplanar CT image reconstructions and MIPs were obtained to evaluate the vascular anatomy. CONTRAST:  120mL ISOVUE-370 IOPAMIDOL (ISOVUE-370) INJECTION 76% COMPARISON:  Chest x-ray 06/17/2018 FINDINGS: Cardiovascular: Non contrasted images of the chest demonstrate no evidence for intramural hematoma. Nonaneurysmal aorta. No dissection seen. Pulsation artifact at the ascending aorta. Normal heart size. No pericardial effusion Mediastinum/Nodes: No enlarged mediastinal, hilar, or axillary lymph nodes. Thyroid gland, trachea, and esophagus demonstrate no significant findings. Lungs/Pleura: 4 mm right apical lung nodule, series 7, image number 23. No acute consolidation, pleural effusion or  pneumothorax. Upper Abdomen: No acute abnormality. 18 mm left adrenal gland  nodule with fat density consistent with adenoma. Diffuse nodularity of the right adrenal gland with 17 mm right adrenal gland fatty nodule. Musculoskeletal: No chest wall abnormality. No acute or significant osseous findings. Review of the MIP images confirms the above findings. IMPRESSION: 1. Negative for aortic aneurysm or aortic dissection. 2. 4 mm right apical lung nodule. No follow-up needed if patient is low-risk. Non-contrast chest CT can be considered in 12 months if patient is high-risk. This recommendation follows the consensus statement: Guidelines for Management of Incidental Pulmonary Nodules Detected on CT Images: From the Fleischner Society 2017; Radiology 2017; 284:228-243. 3. Bilateral adrenal gland nodules, suspected to represent adenoma. Electronically Signed   By: Donavan Foil M.D.   On: 06/17/2018 18:51    EKG: Orders placed or performed during the hospital encounter of 06/17/18  . EKG 12-Lead  . EKG 12-Lead  . ED EKG within 10 minutes  . ED EKG within 10 minutes  . ED EKG  . ED EKG  . EKG 12-Lead  . EKG 12-Lead    IMPRESSION AND PLAN:  42 year old male patient with history of bronchial asthma presented to emergency room for chest pain  -Chest pain Admit patient to telemetry observation bed Start patient on aspirin nitrates Cycle troponin, check echocardiogram Cardiology consult  -Lung nodule Outpatient follow-up with periodic imaging  -Tobacco abuse Tobacco cessation counseled to the patient for 6 minutes Nicotine patch offered   All the records are reviewed and case discussed with ED provider. Management plans discussed with the patient, family and they are in agreement.  CODE STATUS:Full code    TOTAL TIME TAKING CARE OF THIS PATIENT: 52 minutes.    Saundra Shelling M.D on 06/17/2018 at 8:33 PM  Between 7am to 6pm - Pager - [phone]  After 6pm go to [email or web address] -  password [email or web address] [facility]  Office  [phone]  CC: Primary care physician; Patient, No Pcp Per

## 2018-06-18 ENCOUNTER — Observation Stay
Admit: 2018-06-18 | Discharge: 2018-06-18 | Disposition: A | Discharge status: Home / Self Care | Payer: Self-pay | Attending: Internal Medicine | Admitting: Internal Medicine

## 2018-06-18 DIAGNOSIS — R079 Chest pain, unspecified: Secondary | ICD-10-CM

## 2018-06-18 LAB — TROPONIN I

## 2018-06-18 LAB — LIPID PANEL
CHOL/HDL RATIO: 5.9 ratio
Cholesterol: 217 mg/dL — ABNORMAL HIGH (ref 0–200)
HDL: 37 mg/dL — AB (ref >40)
LDL CALC: 122 mg/dL — AB (ref 0–99)
Triglycerides: 290 mg/dL — ABNORMAL HIGH (ref <150)
VLDL: 58 mg/dL — ABNORMAL HIGH (ref 0–40)

## 2018-06-18 LAB — BASIC METABOLIC PANEL
Anion gap: 5 (ref 5–15)
BUN: 13 mg/dL (ref 6–20)
CO2: 26 mmol/L (ref 22–32)
CREATININE: 1.05 mg/dL (ref 0.61–1.24)
Calcium: 8.5 mg/dL — ABNORMAL LOW (ref 8.9–10.3)
Chloride: 108 mmol/L (ref 98–111)
Glucose, Bld: 132 mg/dL — ABNORMAL HIGH (ref 70–99)
Potassium: 3.9 mmol/L (ref 3.5–5.1)
SODIUM: 139 mmol/L (ref 135–145)

## 2018-06-18 MED ORDER — PERFLUTREN LIPID MICROSPHERE
1.0000 mL | INTRAVENOUS | Status: AC | PRN
  Administered 2018-06-18: 2 mL via INTRAVENOUS
  Filled 2018-06-18: qty 10

## 2018-06-18 NOTE — Progress Notes (Signed)
[facility] [place] NAME: Logan Lucas    MR#:  [id number]  DATE OF BIRTH:  1976/02/19  SUBJECTIVE: Admitted for chest pain.  No shortness of breath, troponins negative but he wants to have stress test while admitted.  CHIEF COMPLAINT:   Chief Complaint  Patient presents with  . Chest Pain  Patient still has some chest heaviness but better than yesterday.  REVIEW OF SYSTEMS:    Review of Systems  Constitutional: Negative for chills and fever.  HENT: Negative for hearing loss.   Eyes: Negative for blurred vision, double vision and photophobia.  Respiratory: Negative for cough, hemoptysis and shortness of breath.   Cardiovascular: Positive for chest pain. Negative for palpitations, orthopnea and leg swelling.  Gastrointestinal: Negative for abdominal pain, diarrhea and vomiting.  Genitourinary: Negative for dysuria and urgency.  Musculoskeletal: Negative for myalgias and neck pain.  Skin: Negative for rash.  Neurological: Negative for dizziness, focal weakness, seizures, weakness and headaches.  Psychiatric/Behavioral: Negative for memory loss. The patient does not have insomnia.     Nutrition: Tolerating Diet: Tolerating PT:      DRUG ALLERGIES:  No Known Allergies  VITALS:  Blood pressure 121/83, pulse 74, temperature 98.4 F (36.9 C), temperature source Oral, resp. rate 18, height 5\' 9"  (1.753 m), weight 131.4 kg (289 lb 9.6 oz), SpO2 98 %.  PHYSICAL EXAMINATION:   Physical Exam  GENERAL:  42 y.o.-year-old patient lying in the bed with no acute distress.  EYES: Pupils equal, round, reactive to light and accommodation. No scleral icterus. Extraocular muscles intact.  HEENT: Head atraumatic, normocephalic. Oropharynx and nasopharynx clear.  NECK:  Supple, no jugular venous distention. No thyroid enlargement, no tenderness.  LUNGS: Normal breath sounds bilaterally, no wheezing, rales,rhonchi or crepitation. No  use of accessory muscles of respiration.  CARDIOVASCULAR: S1, S2 normal. No murmurs, rubs, or gallops.  ABDOMEN: Soft, nontender, nondistended. Bowel sounds present. No organomegaly or mass.  EXTREMITIES: No pedal edema, cyanosis, or clubbing.  NEUROLOGIC: Cranial nerves II through XII are intact. Muscle strength 5/5 in all extremities. Sensation intact. Gait not checked.  PSYCHIATRIC: The patient is alert and oriented x 3.  SKIN: No obvious rash, lesion, or ulcer.    LABORATORY PANEL:   CBC Recent Labs  Lab 06/17/18 2235  WBC 9.2  HGB 14.3  HCT 42.8  PLT 235   ------------------------------------------------------------------------------------------------------------------  Chemistries  Recent Labs  Lab 06/18/18 0412  NA 139  K 3.9  CL 108  CO2 26  GLUCOSE 132*  BUN 13  CREATININE 1.05  CALCIUM 8.5*   ------------------------------------------------------------------------------------------------------------------  Cardiac Enzymes Recent Labs  Lab 06/18/18 1007  TROPONINI <0.03   ------------------------------------------------------------------------------------------------------------------  RADIOLOGY:  Dg Chest 2 View  Result Date: 06/17/2018 CLINICAL DATA:  42 year old male with increasing chest pain and shortness of breath for several weeks. Smoker. EXAM: CHEST - 2 VIEW COMPARISON:  Chest radiographs 12/02/2017 and earlier. FINDINGS: Lung volumes and mediastinal contours remain within normal limits. Visualized tracheal air column is within normal limits. Stable lung markings with no pneumothorax, pulmonary edema, pleural effusion or acute pulmonary opacity. Stable mild increased pulmonary interstitial markings, likely related to smoking. Mild scoliosis. No acute osseous abnormality identified. Negative visible bowel gas pattern. IMPRESSION: No acute cardiopulmonary abnormality. Electronically Signed   By: Genevie Ann M.D.   On: 06/17/2018 15:57   Ct Angio Chest  Aorta W And/or Wo Contrast  Result Date: 06/17/2018 CLINICAL DATA:  Worsening chest pain EXAM:  CT ANGIOGRAPHY CHEST WITH CONTRAST TECHNIQUE: Multidetector CT imaging of the chest was performed using the standard protocol during bolus administration of intravenous contrast. Multiplanar CT image reconstructions and MIPs were obtained to evaluate the vascular anatomy. CONTRAST:  178mL ISOVUE-370 IOPAMIDOL (ISOVUE-370) INJECTION 76% COMPARISON:  Chest x-ray 06/17/2018 FINDINGS: Cardiovascular: Non contrasted images of the chest demonstrate no evidence for intramural hematoma. Nonaneurysmal aorta. No dissection seen. Pulsation artifact at the ascending aorta. Normal heart size. No pericardial effusion Mediastinum/Nodes: No enlarged mediastinal, hilar, or axillary lymph nodes. Thyroid gland, trachea, and esophagus demonstrate no significant findings. Lungs/Pleura: 4 mm right apical lung nodule, series 7, image number 23. No acute consolidation, pleural effusion or pneumothorax. Upper Abdomen: No acute abnormality. 18 mm left adrenal gland nodule with fat density consistent with adenoma. Diffuse nodularity of the right adrenal gland with 17 mm right adrenal gland fatty nodule. Musculoskeletal: No chest wall abnormality. No acute or significant osseous findings. Review of the MIP images confirms the above findings. IMPRESSION: 1. Negative for aortic aneurysm or aortic dissection. 2. 4 mm right apical lung nodule. No follow-up needed if patient is low-risk. Non-contrast chest CT can be considered in 12 months if patient is high-risk. This recommendation follows the consensus statement: Guidelines for Management of Incidental Pulmonary Nodules Detected on CT Images: From the Fleischner Society 2017; Radiology 2017; 284:228-243. 3. Bilateral adrenal gland nodules, suspected to represent adenoma. Electronically Signed   By: Donavan Foil M.D.   On: 06/17/2018 18:51     ASSESSMENT AND PLAN:   Active Problems:   Chest  pain   Left sided chest pain/chest heaviness: Admitted to telemetry, patient troponins are negative, echocardiogram results are pending, follow Lexiscan stress test results.  Continue aspirin patient LDL is 122.  Wants to do lifestyle changes before starting  Statins.    #2 uncontrolled hypertension with  out history of diagnosis of hypertension: Continue small dose beta-blocker.   All the records are reviewed and case discussed with Care Management/Social Workerr. Management plans discussed with the patient, family and they are in agreement.  CODE STATUS: Full code  TOTAL TIME TAKING CARE OF THIS PATIENT: 35 minutes.   POSSIBLE D/C IN 1-2 DAYS, DEPENDING ON CLINICAL CONDITION.   Epifanio Lesches M.D on 06/18/2018 at 2:33 PM  Between 7am to 6pm - Pager - [phone]  After 6pm go to [email or web address] - password [email or web address] [facility]  Office  [phone]  CC: Primary care physician; Patient, No Pcp Per

## 2018-06-18 NOTE — Care Management (Signed)
Placed in  observation with chest pain.  Troponins are negative.   Patient presents from home and independent in all adls.  Troponins negative. Cardiology consult pending. No insurance. On no medications prior to this presentation.

## 2018-06-18 NOTE — Plan of Care (Signed)
  Problem: Education: Goal: Knowledge of General Education information will improve Description: Including pain rating scale, medication(s)/side effects and non-pharmacologic comfort measures Outcome: Progressing   Problem: Clinical Measurements: Goal: Ability to maintain clinical measurements within normal limits will improve Outcome: Progressing Goal: Cardiovascular complication will be avoided Outcome: Progressing   Problem: Safety: Goal: Ability to remain free from injury will improve Outcome: Progressing   

## 2018-06-18 NOTE — Progress Notes (Signed)
*  PRELIMINARY RESULTS* Echocardiogram 2D Echocardiogram has been performed.  Logan Lucas 06/18/2018, 11:10 AM

## 2018-06-19 ENCOUNTER — Encounter: Payer: Self-pay | Admitting: Radiology

## 2018-06-19 ENCOUNTER — Observation Stay (HOSPITAL_BASED_OUTPATIENT_CLINIC_OR_DEPARTMENT_OTHER): Payer: PRIVATE HEALTH INSURANCE

## 2018-06-19 DIAGNOSIS — R079 Chest pain, unspecified: Secondary | ICD-10-CM

## 2018-06-19 LAB — NM MYOCAR MULTI W/SPECT W/WALL MOTION / EF
CHL CUP NUCLEAR SRS: 0
CHL CUP NUCLEAR SSS: 1
CHL CUP RESTING HR STRESS: 71 {beats}/min
CSEPHR: 63 %
LV dias vol: 121 mL (ref 62–150)
LV sys vol: 56 mL
Peak HR: 115 {beats}/min
SDS: 0
TID: 0.83

## 2018-06-19 LAB — TROPONIN I

## 2018-06-19 LAB — HIV ANTIBODY (ROUTINE TESTING W REFLEX): HIV SCREEN 4TH GENERATION: NONREACTIVE

## 2018-06-19 LAB — ECHOCARDIOGRAM COMPLETE
Height: 69 in
Weight: 4633.6 oz

## 2018-06-19 MED ORDER — TECHNETIUM TC 99M TETROFOSMIN IV KIT
10.0000 | PACK | Freq: Once | INTRAVENOUS | Status: AC | PRN
  Administered 2018-06-19: 13.59 via INTRAVENOUS

## 2018-06-19 MED ORDER — METOPROLOL TARTRATE 25 MG PO TABS: 12.5000 mg | ORAL_TABLET | Freq: Two times a day (BID) | ORAL | 0 refills | Status: DC

## 2018-06-19 MED ORDER — TECHNETIUM TC 99M TETROFOSMIN IV KIT
30.0000 | PACK | Freq: Once | INTRAVENOUS | Status: AC | PRN
  Administered 2018-06-19: 31.22 via INTRAVENOUS

## 2018-06-19 MED ORDER — REGADENOSON 0.4 MG/5ML IV SOLN
0.4000 mg | Freq: Once | INTRAVENOUS | Status: AC
  Administered 2018-06-19: 0.4 mg via INTRAVENOUS

## 2018-06-19 MED ORDER — NITROGLYCERIN 2 % TD OINT
0.5000 [in_us] | TOPICAL_OINTMENT | Freq: Four times a day (QID) | TRANSDERMAL | Status: DC
  Administered 2018-06-19: 0.5 [in_us] via TOPICAL
  Filled 2018-06-19: qty 1

## 2018-06-19 NOTE — Discharge Summary (Addendum)
Logan Lucas, is a 42 y.o. male  DOB [birth year]  MRN [id number].  Admission date:  06/17/2018  Admitting Physician  Saundra Shelling, MD  Discharge Date:  06/19/2018   Primary MD  Patient, No Pcp Per  Recommendations for primary care physician for things to follow:   Patient has no PCP, advised the patient to have a PCP to establish an have repeat lipid function checkup.   Admission Diagnosis  Chest pain, unspecified type [R07.9]   Discharge Diagnosis  Chest pain, unspecified type [R07.9]    Active Problems:   Chest pain      Past Medical History:  Diagnosis Date  . Asthma     Past Surgical History:  Procedure Laterality Date  . none         History of present illness and  Hospital Course:     Kindly see H&P for history of present illness and admission details, please review complete Labs, Consult reports and Test reports for all details in brief  HPI  from the history and physical done on the day of admission 42 year old male patient without any history of essential hypertension, diabetes mellitus type 2 comes in because of left-sided chest pain.  Patient admitted to observation status for a chest pain.   Hospital Course   *Chest pain, possible angina: Troponins are negative, for 4 times EKG unremarkable, admitted to telemetry.  Patient had a Lexiscan stress test today and it showed low risk scan EF is around 48% but appears normal as per cardiology note, patient denies any chest pain, clinically stable and eager to go home. Echocardiogram showed EF 55 to 60%.  2 hyperlipidemia: Patient LDL is 122, and does not want to go on statins and wants to continue lifestyle changes.    Essential hypertension with a diagnosis of blood high blood pressure: We will give prescription for metoprolol 12.5 mg p.o.  twice daily,  Discharge Condition: Stable   Follow UP      Discharge Instructions  and  Discharge Medications      Allergies as of 06/19/2018   No Known Allergies     Medication List    STOP taking these medications   amoxicillin 500 MG capsule Commonly known as:  AMOXIL   lidocaine 2 % solution Commonly known as:  XYLOCAINE     TAKE these medications   metoprolol tartrate 25 MG tablet Commonly known as:  LOPRESSOR Take 0.5 tablets (12.5 mg total) by mouth 2 (two) times daily.         Diet and Activity recommendation: See Discharge Instructions above   Consults obtained -cardiology  Major procedures and Radiology Reports - PLEASE review detailed and final reports for all details, in brief -      Dg Chest 2 View  Result Date: 06/17/2018 CLINICAL DATA:  42 year old male with increasing chest pain and shortness of breath for several weeks. Smoker. EXAM: CHEST - 2 VIEW COMPARISON:  Chest radiographs 12/02/2017 and earlier. FINDINGS: Lung volumes and mediastinal contours remain within normal limits. Visualized tracheal air column is within normal limits. Stable lung markings with no pneumothorax, pulmonary edema, pleural effusion or acute pulmonary opacity. Stable mild increased pulmonary interstitial markings, likely related to smoking. Mild scoliosis. No acute osseous abnormality identified. Negative visible bowel gas pattern. IMPRESSION: No acute cardiopulmonary abnormality. Electronically Signed   By: Genevie Ann M.D.   On: 06/17/2018 15:57   Nm Myocar Multi W/spect W/wall Motion / Ef  Result Date: 06/19/2018  T  wave inversion was noted during stress in the II, III, aVF and V6 leads.  There was no ST segment deviation noted during stress.  The study is normal.  This is a low risk study.  The left ventricular ejection fraction is normal (55-65%).  Calculated EF is 48% but visually appears to be normal.    Ct Angio Chest Aorta W And/or Wo Contrast  Result  Date: 06/17/2018 CLINICAL DATA:  Worsening chest pain EXAM: CT ANGIOGRAPHY CHEST WITH CONTRAST TECHNIQUE: Multidetector CT imaging of the chest was performed using the standard protocol during bolus administration of intravenous contrast. Multiplanar CT image reconstructions and MIPs were obtained to evaluate the vascular anatomy. CONTRAST:  137mL ISOVUE-370 IOPAMIDOL (ISOVUE-370) INJECTION 76% COMPARISON:  Chest x-ray 06/17/2018 FINDINGS: Cardiovascular: Non contrasted images of the chest demonstrate no evidence for intramural hematoma. Nonaneurysmal aorta. No dissection seen. Pulsation artifact at the ascending aorta. Normal heart size. No pericardial effusion Mediastinum/Nodes: No enlarged mediastinal, hilar, or axillary lymph nodes. Thyroid gland, trachea, and esophagus demonstrate no significant findings. Lungs/Pleura: 4 mm right apical lung nodule, series 7, image number 23. No acute consolidation, pleural effusion or pneumothorax. Upper Abdomen: No acute abnormality. 18 mm left adrenal gland nodule with fat density consistent with adenoma. Diffuse nodularity of the right adrenal gland with 17 mm right adrenal gland fatty nodule. Musculoskeletal: No chest wall abnormality. No acute or significant osseous findings. Review of the MIP images confirms the above findings. IMPRESSION: 1. Negative for aortic aneurysm or aortic dissection. 2. 4 mm right apical lung nodule. No follow-up needed if patient is low-risk. Non-contrast chest CT can be considered in 12 months if patient is high-risk. This recommendation follows the consensus statement: Guidelines for Management of Incidental Pulmonary Nodules Detected on CT Images: From the Fleischner Society 2017; Radiology 2017; 284:228-243. 3. Bilateral adrenal gland nodules, suspected to represent adenoma. Electronically Signed   By: Donavan Foil M.D.   On: 06/17/2018 18:51    Micro Results     No results found for this or any previous visit (from the past 240  hour(s)).     Today   Subjective:   Logan Lucas today has no headache,no chest abdominal pain,no new weakness tingling or numbness, feels much better wants to go home today.  Objective:   Blood pressure 140/84, pulse 79, temperature 98.2 F (36.8 C), temperature source Oral, resp. rate 18, height 5\' 9"  (1.753 m), weight 128.9 kg (284 lb 1.6 oz), SpO2 100 %.  No intake or output data in the 24 hours ending 06/19/18 1349  Exam Awake Alert, Oriented x 3, No new F.N deficits, Normal affect [place].AT,PERRAL Supple Neck,No JVD, No cervical lymphadenopathy appriciated.  Symmetrical Chest wall movement, Good air movement bilaterally, CTAB RRR,No Gallops,Rubs or new Murmurs, No Parasternal Heave +ve B.Sounds, Abd Soft, Non tender, No organomegaly appriciated, No rebound -guarding or rigidity. No Cyanosis, Clubbing or edema, No new Rash or bruise  Data Review   CBC w Diff:  Lab Results  Component Value Date   WBC 9.2 06/17/2018   HGB 14.3 06/17/2018   HGB 15.3 07/25/2014   HCT 42.8 06/17/2018   HCT 47.4 07/25/2014   PLT 235 06/17/2018   PLT 256 07/25/2014   LYMPHOPCT 25.4 07/25/2014   MONOPCT 10.6 07/25/2014   EOSPCT 2.8 07/25/2014   BASOPCT 0.9 07/25/2014    CMP:  Lab Results  Component Value Date   NA 139 06/18/2018   NA 143 07/25/2014   K 3.9 06/18/2018   K 4.0  07/25/2014   CL 108 06/18/2018   CL 108 (H) 07/25/2014   CO2 26 06/18/2018   CO2 23 07/25/2014   BUN 13 06/18/2018   BUN 8 07/25/2014   CREATININE 1.05 06/18/2018   CREATININE 1.26 07/25/2014   PROT 7.4 02/06/2016   PROT 7.7 02/10/2014   ALBUMIN 4.1 02/06/2016   ALBUMIN 4.2 02/10/2014   BILITOT 1.1 02/06/2016   BILITOT 0.3 02/10/2014   ALKPHOS 105 02/06/2016   ALKPHOS 112 02/10/2014   AST 44 (H) 02/06/2016   AST 31 02/10/2014   ALT 39 02/06/2016   ALT 35 02/10/2014  .   Total Time in preparing paper work, data evaluation and todays exam - 52 minutes  Epifanio Lesches M.D on 06/19/2018 at  1:49 PM    Note: This dictation was prepared with Dragon dictation along with smaller phrase technology. Any transcriptional errors that result from this process are unintentional.

## 2018-06-19 NOTE — Consult Note (Signed)
Reason for Consult: Chest pain and angina Referring Physician: Dr. Vianne Bulls hospitalist  Logan Lucas is an 42 y.o. male.  HPI: Patient is a 42 year old black male history of mild obesity presented with chest pain history of smoking started having chest pain at work and was referred to the emergency room work-up in the ER was essentially unremarkable with negative troponins patient was given a CTA which showed no pulmonary embolus but a small pulmonary nodule patient had improvement in his chest pain but was admitted for further assessment evaluation  Past Medical History:  Diagnosis Date  . Asthma     Past Surgical History:  Procedure Laterality Date  . none      Family History  Problem Relation Age of Onset  . Diabetes Mother   . Cancer Father     Social History:  reports that he has been smoking cigarettes.  He has a 5.00 pack-year smoking history. He has never used smokeless tobacco. He reports that he drinks about 4.2 oz of alcohol per week. He reports that he does not use drugs.  Allergies: No Known Allergies  Medications: I have reviewed the patient's current medications.  Results for orders placed or performed during the hospital encounter of 06/17/18 (from the past 48 hour(s))  Basic metabolic panel     Status: Abnormal   Collection Time: 06/17/18  3:30 PM  Result Value Ref Range   Sodium 139 135 - 145 mmol/L   Potassium 3.7 3.5 - 5.1 mmol/L   Chloride 105 98 - 111 mmol/L   CO2 28 22 - 32 mmol/L   Glucose, Bld 145 (H) 70 - 99 mg/dL   BUN 14 6 - 20 mg/dL   Creatinine, Ser 1.28 (H) 0.61 - 1.24 mg/dL   Calcium 9.2 8.9 - 10.3 mg/dL   GFR calc non Af Amer >60 >60 mL/min   GFR calc Af Amer >60 >60 mL/min    Comment: (NOTE) The eGFR has been calculated using the CKD EPI equation. This calculation has not been validated in all clinical situations. eGFR's persistently <60 mL/min signify possible Chronic Kidney Disease.    Anion gap 6 5 - 15    Comment: Performed  at [facility], [address] [place], [place] [address]  CBC     Status: Abnormal   Collection Time: 06/17/18  3:30 PM  Result Value Ref Range   WBC 9.7 3.8 - 10.6 K/uL   RBC 5.04 4.40 - 5.90 MIL/uL   Hemoglobin 14.3 13.0 - 18.0 g/dL   HCT 43.4 40.0 - 52.0 %   MCV 86.2 80.0 - 100.0 fL   MCH 28.3 26.0 - 34.0 pg   MCHC 32.8 32.0 - 36.0 g/dL   RDW 14.9 (H) 11.5 - 14.5 %   Platelets 247 150 - 440 K/uL    Comment: Performed at [facility], [address] [place], [place] [address]  Troponin I     Status: None   Collection Time: 06/17/18  3:30 PM  Result Value Ref Range   Troponin I <0.03 <0.03 ng/mL    Comment: Performed at [facility], [address] [place], [place] [address]  Troponin I     Status: None   Collection Time: 06/17/18  6:24 PM  Result Value Ref Range   Troponin I <0.03 <0.03 ng/mL    Comment: Performed at [facility], [address] [place], [place] [address]  HIV antibody (Routine Testing)     Status: None  Collection Time: 06/17/18 10:35 PM  Result Value Ref Range   HIV Screen 4th Generation wRfx Non Reactive Non Reactive    Comment: (NOTE) Performed At: [facility] [place], [place] [id number] Rush Farmer MD YK:[id number]   CBC     Status: Abnormal   Collection Time: 06/17/18 10:35 PM  Result Value Ref Range   WBC 9.2 3.8 - 10.6 K/uL   RBC 4.95 4.40 - 5.90 MIL/uL   Hemoglobin 14.3 13.0 - 18.0 g/dL   HCT 42.8 40.0 - 52.0 %   MCV 86.4 80.0 - 100.0 fL   MCH 28.8 26.0 - 34.0 pg   MCHC 33.4 32.0 - 36.0 g/dL   RDW 15.2 (H) 11.5 - 14.5 %   Platelets 235 150 - 440 K/uL    Comment: Performed at [facility], [address] [place], [place] [address]  Creatinine, serum     Status: None   Collection Time: 06/17/18 10:35 PM  Result Value Ref Range   Creatinine, Ser 1.02 0.61 - 1.24 mg/dL   GFR calc non Af Amer >60 >60 mL/min   GFR calc Af Amer >60 >60 mL/min    Comment:  (NOTE) The eGFR has been calculated using the CKD EPI equation. This calculation has not been validated in all clinical situations. eGFR's persistently <60 mL/min signify possible Chronic Kidney Disease. Performed at [facility], [address] [place], [place] [address]   Troponin I     Status: None   Collection Time: 06/17/18 10:35 PM  Result Value Ref Range   Troponin I <0.03 <0.03 ng/mL    Comment: Performed at [facility], [address] [place], [place] [address]  Troponin I     Status: None   Collection Time: 06/18/18  4:12 AM  Result Value Ref Range   Troponin I <0.03 <0.03 ng/mL    Comment: Performed at [facility], [address] [place], [place] [address]  Lipid panel     Status: Abnormal   Collection Time: 06/18/18  4:12 AM  Result Value Ref Range   Cholesterol 217 (H) 0 - 200 mg/dL   Triglycerides 290 (H) <150 mg/dL   HDL 37 (L) >40 mg/dL   Total CHOL/HDL Ratio 5.9 RATIO   VLDL 58 (H) 0 - 40 mg/dL   LDL Cholesterol 122 (H) 0 - 99 mg/dL    Comment:        Total Cholesterol/HDL:CHD Risk Coronary Heart Disease Risk Table                     Men   Women  1/2 Average Risk   3.4   3.3  Average Risk       5.0   4.4  2 X Average Risk   9.6   7.1  3 X Average Risk  23.4   11.0        Use the calculated Patient Ratio above and the CHD Risk Table to determine the patient's CHD Risk.        ATP III CLASSIFICATION (LDL):  <100     mg/dL   Optimal  100-129  mg/dL   Near or Above                    Optimal  130-159  mg/dL   Borderline  160-189  mg/dL   High  >190     mg/dL   Very High Performed at [facility], [address] [place], [place] [address]   Basic metabolic panel     Status: Abnormal   Collection Time: 06/18/18  4:12 AM  Result Value Ref Range   Sodium 139 135 - 145 mmol/L   Potassium 3.9 3.5 - 5.1 mmol/L   Chloride 108 98 - 111 mmol/L   CO2 26 22 - 32 mmol/L   Glucose, Bld 132 (H) 70 - 99 mg/dL   BUN  13 6 - 20 mg/dL   Creatinine, Ser 1.05 0.61 - 1.24 mg/dL   Calcium 8.5 (L) 8.9 - 10.3 mg/dL   GFR calc non Af Amer >60 >60 mL/min   GFR calc Af Amer >60 >60 mL/min    Comment: (NOTE) The eGFR has been calculated using the CKD EPI equation. This calculation has not been validated in all clinical situations. eGFR's persistently <60 mL/min signify possible Chronic Kidney Disease.    Anion gap 5 5 - 15    Comment: Performed at [facility], [address] [place], [place] [address]  Troponin I     Status: None   Collection Time: 06/18/18 10:07 AM  Result Value Ref Range   Troponin I <0.03 <0.03 ng/mL    Comment: Performed at [facility], [place], [place] [address]  Troponin I (q 6hr x 3)     Status: None   Collection Time: 06/19/18  8:34 AM  Result Value Ref Range   Troponin I <0.03 <0.03 ng/mL    Comment: Performed at [facility], [address] [place], [place] [address]    Dg Chest 2 View  Result Date: 06/17/2018 CLINICAL DATA:  42 year old male with increasing chest pain and shortness of breath for several weeks. Smoker. EXAM: CHEST - 2 VIEW COMPARISON:  Chest radiographs 12/02/2017 and earlier. FINDINGS: Lung volumes and mediastinal contours remain within normal limits. Visualized tracheal air column is within normal limits. Stable lung markings with no pneumothorax, pulmonary edema, pleural effusion or acute pulmonary opacity. Stable mild increased pulmonary interstitial markings, likely related to smoking. Mild scoliosis. No acute osseous abnormality identified. Negative visible bowel gas pattern. IMPRESSION: No acute cardiopulmonary abnormality. Electronically Signed   By: Genevie Ann M.D.   On: 06/17/2018 15:57   Ct Angio Chest Aorta W And/or Wo Contrast  Result Date: 06/17/2018 CLINICAL DATA:  Worsening chest pain EXAM: CT ANGIOGRAPHY CHEST WITH CONTRAST TECHNIQUE: Multidetector CT imaging of the chest was performed using the standard  protocol during bolus administration of intravenous contrast. Multiplanar CT image reconstructions and MIPs were obtained to evaluate the vascular anatomy. CONTRAST:  132m ISOVUE-370 IOPAMIDOL (ISOVUE-370) INJECTION 76% COMPARISON:  Chest x-ray 06/17/2018 FINDINGS: Cardiovascular: Non contrasted images of the chest demonstrate no evidence for intramural hematoma. Nonaneurysmal aorta. No dissection seen. Pulsation artifact at the ascending aorta. Normal heart size. No pericardial effusion Mediastinum/Nodes: No enlarged mediastinal, hilar, or axillary lymph nodes. Thyroid gland, trachea, and esophagus demonstrate no significant findings. Lungs/Pleura: 4 mm right apical lung nodule, series 7, image number 23. No acute consolidation, pleural effusion or pneumothorax. Upper Abdomen: No acute abnormality. 18 mm left adrenal gland nodule with fat density consistent with adenoma. Diffuse nodularity of the right adrenal gland with 17 mm right adrenal gland fatty nodule. Musculoskeletal: No chest wall abnormality. No acute or significant osseous findings. Review of the MIP images confirms the above findings. IMPRESSION: 1. Negative for aortic aneurysm or aortic dissection. 2. 4 mm right apical lung nodule. No follow-up needed if patient is low-risk. Non-contrast chest  CT can be considered in 12 months if patient is high-risk. This recommendation follows the consensus statement: Guidelines for Management of Incidental Pulmonary Nodules Detected on CT Images: From the Fleischner Society 2017; Radiology 2017; 284:228-243. 3. Bilateral adrenal gland nodules, suspected to represent adenoma. Electronically Signed   By: Donavan Foil M.D.   On: 06/17/2018 18:51    Review of Systems  Constitutional: Positive for malaise/fatigue.  HENT: Positive for congestion.   Eyes: Negative.   Respiratory: Positive for shortness of breath.   Cardiovascular: Positive for chest pain.  Gastrointestinal: Negative.   Genitourinary: Negative.    Musculoskeletal: Negative.   Skin: Negative.   Neurological: Negative.   Endo/Heme/Allergies: Negative.   Psychiatric/Behavioral: Negative.    Blood pressure 140/84, pulse 79, temperature 98.2 F (36.8 C), temperature source Oral, resp. rate 18, height _0  (1.753 m), weight 284 lb 1.6 oz (128.9 kg), SpO2 100 %. Physical Exam  Nursing note and vitals reviewed. Constitutional: He is oriented to person, place, and time. He appears well-developed and well-nourished.  HENT:  Head: Normocephalic and atraumatic.  Eyes: Pupils are equal, round, and reactive to light. Conjunctivae and EOM are normal.  Neck: Normal range of motion. Neck supple.  Cardiovascular: Normal rate, regular rhythm and normal heart sounds.  Respiratory: Effort normal and breath sounds normal.  GI: Soft. Bowel sounds are normal.  Musculoskeletal: Normal range of motion.  Neurological: He is alert and oriented to person, place, and time. He has normal reflexes.  Skin: Skin is warm and dry.  Psychiatric: He has a normal mood and affect.    Assessment/Plan: Chest pain Possible angina Asthma Mild obesity Mild renal insufficiency . Plan Agree with current therapy Rule out myocardial infarction Short-term anti-coagulation Follow-up EKGs and troponin Mild hydration for renal insufficiency Recommend functional study prior to discharge  Maurina Fawaz D Kaira [place]field 06/19/2018, 1:21 PM

## 2018-06-19 NOTE — Plan of Care (Signed)
°  Problem: Clinical Measurements: °Goal: Ability to maintain clinical measurements within normal limits will improve °Outcome: Progressing °Goal: Diagnostic test results will improve °Outcome: Progressing °Goal: Cardiovascular complication will be avoided °Outcome: Progressing °  °

## 2018-06-19 NOTE — Progress Notes (Signed)
Pt discharged home today, no complaints of CP, VSS, in NAD. IV removed, pt provided with prescription information and follow up instructions.

## 2018-06-19 NOTE — Progress Notes (Signed)
MD notified, pt c/o of 5/10 burning chest pain upon assessment, relieved w/ 1 SL nitro, 12 lead EKG placed on chart, stat troponin ordered, w/ nitropaste. Pt is in NAD at this time, resting comfortably.

## 2018-07-29 ENCOUNTER — Emergency Department
Admission: EM | Admit: 2018-07-29 | Discharge: 2018-07-29 | Disposition: A | Discharge status: Home / Self Care | Payer: PRIVATE HEALTH INSURANCE | Attending: Emergency Medicine | Admitting: Emergency Medicine

## 2018-07-29 DIAGNOSIS — I10 Essential (primary) hypertension: Secondary | ICD-10-CM | POA: Insufficient documentation

## 2018-07-29 DIAGNOSIS — X58XXXA Exposure to other specified factors, initial encounter: Secondary | ICD-10-CM | POA: Insufficient documentation

## 2018-07-29 DIAGNOSIS — S025XXA Fracture of tooth (traumatic), initial encounter for closed fracture: Secondary | ICD-10-CM | POA: Insufficient documentation

## 2018-07-29 DIAGNOSIS — Z79899 Other long term (current) drug therapy: Secondary | ICD-10-CM | POA: Insufficient documentation

## 2018-07-29 DIAGNOSIS — J45909 Unspecified asthma, uncomplicated: Secondary | ICD-10-CM | POA: Insufficient documentation

## 2018-07-29 DIAGNOSIS — K029 Dental caries, unspecified: Secondary | ICD-10-CM

## 2018-07-29 DIAGNOSIS — F17210 Nicotine dependence, cigarettes, uncomplicated: Secondary | ICD-10-CM | POA: Insufficient documentation

## 2018-07-29 DIAGNOSIS — Y939 Activity, unspecified: Secondary | ICD-10-CM | POA: Insufficient documentation

## 2018-07-29 DIAGNOSIS — Y999 Unspecified external cause status: Secondary | ICD-10-CM | POA: Insufficient documentation

## 2018-07-29 DIAGNOSIS — Y929 Unspecified place or not applicable: Secondary | ICD-10-CM | POA: Insufficient documentation

## 2018-07-29 MED ORDER — LIDOCAINE-EPINEPHRINE 2 %-1:100000 IJ SOLN
1.7000 mL | Freq: Once | INTRAMUSCULAR | Status: AC
  Administered 2018-07-29: 1.7 mL
  Filled 2018-07-29: qty 1.7

## 2018-07-29 MED ORDER — AMOXICILLIN 500 MG PO CAPS
500.0000 mg | ORAL_CAPSULE | Freq: Once | ORAL | Status: AC
  Administered 2018-07-29: 500 mg via ORAL
  Filled 2018-07-29: qty 1

## 2018-07-29 MED ORDER — METOPROLOL TARTRATE 25 MG PO TABS: 12.5000 mg | ORAL_TABLET | Freq: Two times a day (BID) | ORAL | 1 refills | Status: DC

## 2018-07-29 MED ORDER — TRAMADOL HCL 50 MG PO TABS: 50.0000 mg | ORAL_TABLET | Freq: Two times a day (BID) | ORAL | 0 refills | Status: AC

## 2018-07-29 MED ORDER — AMOXICILLIN 500 MG PO CAPS: 500.0000 mg | ORAL_CAPSULE | Freq: Three times a day (TID) | ORAL | 0 refills | Status: DC

## 2018-07-29 MED ORDER — TRAMADOL HCL 50 MG PO TABS
50.0000 mg | ORAL_TABLET | Freq: Once | ORAL | Status: AC
  Administered 2018-07-29: 50 mg via ORAL
  Filled 2018-07-29: qty 1

## 2018-07-29 NOTE — ED Triage Notes (Signed)
Pt came to ED via pov c/o right sided toothpain. Reports has taken ibuprofen with no relief. Started Saturday.

## 2018-07-29 NOTE — Discharge Instructions (Addendum)
Take the prescription antibiotic as directed. Take the pain medicine as needed. Follow-up with one of the dental clinics listed below. Restart your blood pressure medicine, and follow-up with the [place] Medication Assistance program for prescription assistance.   OPTIONS FOR DENTAL FOLLOW UP CARE  [place] [facility] and [place] [email or web address] [phone])  [facility] [phone])  [place] [phone])  [place] [phone])  [facility] [phone]) This clinic caters to the indigent population and is on a lottery system. Location: [employer] [employer] [facility], [place], [place], [place] Clinic Hours: Wednesdays from 6pm - 9pm, patients seen by a lottery system. For dates, call or go to [email or web address] Services: Cleanings, fillings and simple extractions. Payment Options: DENTAL WORK IS FREE OF CHARGE. Bring proof of income or support. Best way to get seen: Arrive at 5:15 pm - this is a lottery, NOT first come/first serve, so arriving earlier will not increase your chances of being seen.     [facility] [phone] Select option 1 for emergencies   Location: [facility], [place], [address], [place] Clinic Hours: No walk-ins accepted - call the day before to schedule an appointment. Check in times are 9:30 am and 1:30 pm. Services: Simple extractions, temporary fillings, pulpectomy/pulp debridement, uncomplicated abscess drainage. Payment Options: PAYMENT IS DUE AT THE TIME OF SERVICE.  Fee is usually $100-200, additional surgical procedures (e.g. abscess drainage) may be extra. Cash, checks, Visa/MasterCard accepted.  Can file Medicaid if patient is covered for dental - patient should  call case worker to check. No discount for [facility] patients. Best way to get seen: MUST call the day before and get onto the schedule. Can usually be seen the next 1-2 days. No walk-ins accepted.     [place] [phone]   Location: [place], [place] Clinic Hours: M, W, Th, F 8am or 1:30pm, Tues 9a or 1:30 - first come/first served. Services: Simple extractions, temporary fillings, uncomplicated abscess drainage.  You do not need to be an [facility] resident. Payment Options: PAYMENT IS DUE AT THE TIME OF SERVICE. Dental insurance, otherwise sliding scale - bring proof of income or support. Depending on income and treatment needed, cost is usually $50-200. Best way to get seen: Arrive early as it is first come/first served.     [facility] [phone]   Location: [place] [facility] Hours: Mon-Thu 8a-5p Services: Most basic dental services including extractions and fillings. Payment Options: PAYMENT IS DUE AT THE TIME OF SERVICE. Sliding scale, up to 50% off - bring proof if income or support. Medicaid with dental option accepted. Best way to get seen: Call to schedule an appointment, can usually be seen within 2 weeks OR they will try to see walk-ins - show up at [place] or 2p (you may have to wait).     [facility] [place] RESIDENTS ONLY   Location: [facility], [place] [address] [place], [place] [address] Clinic Hours: By appointment only. Monday - Thursday 8am-5pm, Friday 8am-12pm Services: Cleanings, fillings, extractions. Payment Options: PAYMENT IS DUE AT THE TIME OF SERVICE. Cash, Visa or MasterCard. Sliding scale - $30 minimum per service. Best way to get seen: Come in to office, complete packet and make an appointment - need proof  of income or support monies for each household member and  proof of [facility] residence. Usually takes about a month to get in.     [facility] [phone]   Location: [address] [place] Clinic Hours: Walk-in Urgent Care Dental Services are offered Monday-Friday mornings only. The numbers of emergencies accepted daily is limited to the number of providers available. Maximum 15 - Mondays, Wednesdays & Thursdays Maximum 10 - Tuesdays & Fridays Services: You do not need to be a [facility] resident to be seen for a dental emergency. Emergencies are defined as pain, swelling, abnormal bleeding, or dental trauma. Walkins will receive x-rays if needed. NOTE: Dental cleaning is not an emergency. Payment Options: PAYMENT IS DUE AT THE TIME OF SERVICE. Minimum co-pay is $40.00 for uninsured patients. Minimum co-pay is $3.00 for Medicaid with dental coverage. Dental Insurance is accepted and must be presented at time of visit. Medicare does not cover dental. Forms of payment: Cash, credit card, checks. Best way to get seen: If not previously registered with the clinic, walk-in dental registration begins at 7:15 am and is on a first come/first serve basis. If previously registered with the clinic, call to make an appointment.     [facility] [place] ONLY   Location: [address], [place], [place] Clinic Hours: Mon-Thu 10a-2p Services: Extractions only! Payment Options: FREE (donations accepted) - bring proof of income or support Best way to get seen: Call and schedule an appointment OR come at 8am on the 1st Monday of every month (except for holidays) when it is first come/first served.     [facility] [phone]   Location: [place], [place] Clinic Hours: Friday mornings Services, Payment Options, Best way to get seen: Call for info

## 2018-07-29 NOTE — ED Provider Notes (Signed)
[facility] Emergency Department Provider Note ____________________________________________  Time seen: 1638  I have reviewed the triage vital signs and the nursing notes.  HISTORY  Chief Complaint  Dental Pain  HPI Logan Lucas is a 42 y.o. male who presents himself to the ED for evaluation of right lower dental pain.  Patient with a chronically broken second and third molars, presents with dental pain is increased over the last several days, since Saturday.  He has taken ibuprofen without significant benefit.  He reports some pressure and fullness to the right lower jaw and side of the face.  He denies any fevers, spontaneous purulent drainage, difficulty swallowing, controlling secretions, eating, or drinking.  Also denies any chest pain or shortness of breath.  He presents now for further evaluation of his symptoms.  Past Medical History:  Diagnosis Date  . Asthma     Patient Active Problem List   Diagnosis Date Noted  . Chest pain 06/17/2018    Past Surgical History:  Procedure Laterality Date  . none      Prior to Admission medications   Medication Sig Start Date End Date Taking? Authorizing Provider  amoxicillin (AMOXIL) 500 MG capsule Take 1 capsule (500 mg total) by mouth 3 (three) times daily. 07/29/18   Jennah Satchell, Dannielle Karvonen, PA-C  metoprolol tartrate (LOPRESSOR) 25 MG tablet Take 0.5 tablets (12.5 mg total) by mouth 2 (two) times daily. 06/19/18   Epifanio Lesches, MD  metoprolol tartrate (LOPRESSOR) 25 MG tablet Take 0.5 tablets (12.5 mg total) by mouth 2 (two) times daily. 07/29/18 09/27/18  Meeka Cartelli, Dannielle Karvonen, PA-C  traMADol (ULTRAM) 50 MG tablet Take 1 tablet (50 mg total) by mouth 2 (two) times daily for 3 days. 07/29/18 08/01/18  Ilianna Bown, Dannielle Karvonen, PA-C    Allergies Patient has no known allergies.  Family History  Problem Relation Age of Onset  . Diabetes Mother   . Cancer Father     Social History Social History    Tobacco Use  . Smoking status: Current Every Day Smoker    Packs/day: 1.00    Years: 5.00    Pack years: 5.00    Types: Cigarettes  . Smokeless tobacco: Never Used  Substance Use Topics  . Alcohol use: Yes    Alcohol/week: 7.0 standard drinks    Types: 2 Cans of beer, 5 Shots of liquor per week  . Drug use: No    Frequency: 1.0 times per week    Review of Systems  Constitutional: Negative for fever. Eyes: Negative for visual changes. ENT: Negative for sore throat.  Right lower dental pain as above. Cardiovascular: Negative for chest pain. Respiratory: Negative for shortness of breath. Gastrointestinal: Negative for abdominal pain, vomiting and diarrhea. Genitourinary: Negative for dysuria. Musculoskeletal: Negative for back pain. Skin: Negative for rash. Neurological: Negative for headaches, focal weakness or numbness. ____________________________________________  PHYSICAL EXAM:  VITAL SIGNS: ED Triage Vitals  Enc Vitals Group     BP 07/29/18 1535 (!) 158/98     Pulse Rate 07/29/18 1535 88     Resp 07/29/18 1535 16     Temp 07/29/18 1535 98.1 F (36.7 C)     Temp Source 07/29/18 1535 Oral     SpO2 07/29/18 1535 97 %     Weight 07/29/18 1536 283 lb (128.4 kg)     Height 07/29/18 1536 5\' 9"  (1.753 m)     Head Circumference --      Peak Flow --  Pain Score 07/29/18 1536 10     Pain Loc --      Pain Edu? --      Excl. in [place]? --     Constitutional: Alert and oriented. Well appearing and in no distress. Head: Normocephalic and atraumatic. Eyes: Conjunctivae are normal. PERRL. Normal extraocular movements Ears: Canals clear. TMs intact bilaterally. Nose: No congestion/rhinorrhea/epistaxis. Mouth/Throat: Mucous membranes are moist.  Uvula is midline and tonsils are flat.  Patient with no focal swelling or gum edema noted to the right lower second and third molars.  The teeth both have amalgam fillings in place, but the lateral of posterior enamel is broken  away.  No brawny sublingual erythema is appreciated. Neck: Supple. No thyromegaly. Hematological/Lymphatic/Immunological: No cervical lymphadenopathy. Cardiovascular: Normal rate, regular rhythm. Normal distal pulses. Respiratory: Normal respiratory effort. No wheezes/rales/rhonchi. ____________________________________________  PROCEDURES  Procedures Amoxil 500 mg PO Ultram 50 mg p.o.  DENTAL BLOCK  Performed by: Melvenia Needles Consent: Verbal consent obtained. Required items: devices and special equipment available Time out: Immediately prior to procedure a "time out" was called to verify the correct patient, procedure, equipment, support staff and site/side marked as required.  Indication: Pain Nerve block body site: Right lower second molar  Preparation: Patient was prepped and draped in the usual sterile fashion. Needle gauge: 86 G Location technique: anatomical landmarks  Local anesthetic: 2%-1: 100,000 lidocaine with epi  Anesthetic total: 1.5 ml  Outcome: pain improved Patient tolerance: Patient tolerated the procedure well with no immediate complications. ____________________________________________  INITIAL IMPRESSION / ASSESSMENT AND PLAN / ED COURSE  Patient with ED evaluation of right lower dental pain secondary to dental caries.  Patient's concern is for early abscess formation.  He will be treated empirically with amoxicillin for dental infection prevention.  He is also given a small prescription for tramadol (#6) for more significant pain relief.  He is also given a courtesy refill of his previously written metoprolol.  He will follow-up with 1 of the local community clinics, and seek medication assistance with the [place] assistance program.  Return precautions have been reviewed.  I reviewed the patient's prescription history over the last 12 months in the multi-state controlled substances database(s) that includes [place], [place], [place], [place],  [place], [place], [place], [place], [place], [place], [place], [place], [place], and [place].  Results were notable for no current prescriptions. ____________________________________________  FINAL CLINICAL IMPRESSION(S) / ED DIAGNOSES  Final diagnoses:  Pain due to dental caries  Closed fracture of tooth, initial encounter  Uncontrolled hypertension      Carmie End, Dannielle Karvonen, PA-C 07/29/18 1710    Harvest Dark, MD 07/29/18 1859

## 2018-07-29 NOTE — ED Notes (Signed)
Pt calling for ride to be picked up. Instructed not the importance of not driving on pain medicine. Pt understood and verbalized understanding of discharge instructions.

## 2018-12-08 ENCOUNTER — Emergency Department
Admission: EM | Admit: 2018-12-08 | Discharge: 2018-12-08 | Disposition: A | Discharge status: Home / Self Care | Payer: Self-pay | Attending: Student in an Organized Health Care Education/Training Program | Admitting: Student in an Organized Health Care Education/Training Program

## 2018-12-08 ENCOUNTER — Other Ambulatory Visit: Payer: Self-pay

## 2018-12-08 ENCOUNTER — Encounter: Payer: Self-pay | Admitting: Emergency Medicine

## 2018-12-08 ENCOUNTER — Emergency Department: Payer: Self-pay

## 2018-12-08 DIAGNOSIS — M25562 Pain in left knee: Secondary | ICD-10-CM | POA: Insufficient documentation

## 2018-12-08 DIAGNOSIS — F17210 Nicotine dependence, cigarettes, uncomplicated: Secondary | ICD-10-CM | POA: Insufficient documentation

## 2018-12-08 DIAGNOSIS — I10 Essential (primary) hypertension: Secondary | ICD-10-CM | POA: Insufficient documentation

## 2018-12-08 DIAGNOSIS — J45909 Unspecified asthma, uncomplicated: Secondary | ICD-10-CM | POA: Insufficient documentation

## 2018-12-08 MED ORDER — METOPROLOL TARTRATE 25 MG PO TABS
12.5000 mg | ORAL_TABLET | Freq: Once | ORAL | Status: AC
  Administered 2018-12-08: 12.5 mg via ORAL
  Filled 2018-12-08: qty 1

## 2018-12-08 MED ORDER — METOPROLOL TARTRATE 25 MG PO TABS: 12.5000 mg | ORAL_TABLET | Freq: Two times a day (BID) | ORAL | 1 refills | Status: DC

## 2018-12-08 MED ORDER — CYCLOBENZAPRINE HCL 5 MG PO TABS: 5.0000 mg | ORAL_TABLET | Freq: Every day | ORAL | 0 refills | Status: AC

## 2018-12-08 NOTE — Discharge Instructions (Signed)
Your exam and x-ray are negative for fracture or dislocation to the left knee. You should consider follow-up with orthopedics. Wear the ace bandage or knee brace for support. Take OTC Tylenol for additional pain relief. Follow-up with one of the local community clinics for routine management of your blood pressure.

## 2018-12-08 NOTE — ED Provider Notes (Signed)
[facility] Emergency Department Provider Note ____________________________________________  Time seen: 1659  I have reviewed the triage vital signs and the nursing notes.  HISTORY  Chief Complaint  Knee Pain and Hypertension  HPI Logan Lucas is a 43 y.o. male sent himself to the ED for evaluation of several days of worsening, intermittent left knee pain.  Patient denies any pre-existing injury, accident, or trauma.  He reports over the last several months he has had some intermittent pain to the left knee and a sensation that the knee would give out mid stride.  He presents now for evaluation of his left knee pain.  He also is concerned for his elevated blood pressure, since he had run out of his previously prescribed metoprolol last month.  He denies any chest pain, shortness of breath, diaphoresis, or weakness.  He has not established medical care at home as was previously suggested.  Past Medical History:  Diagnosis Date  . Asthma     Patient Active Problem List   Diagnosis Date Noted  . Chest pain 06/17/2018    Past Surgical History:  Procedure Laterality Date  . none      Prior to Admission medications   Medication Sig Start Date End Date Taking? Authorizing Provider  cyclobenzaprine (FLEXERIL) 5 MG tablet Take 1 tablet (5 mg total) by mouth at bedtime for 30 days. 12/08/18 01/07/19  Amyri Frenz, Dannielle Karvonen, PA-C  metoprolol tartrate (LOPRESSOR) 25 MG tablet Take 0.5 tablets (12.5 mg total) by mouth 2 (two) times daily. 12/08/18 02/06/19  Gorgeous Newlun, Dannielle Karvonen, PA-C    Allergies Patient has no known allergies.  Family History  Problem Relation Age of Onset  . Diabetes Mother   . Cancer Father     Social History Social History   Tobacco Use  . Smoking status: Current Every Day Smoker    Packs/day: 1.00    Years: 5.00    Pack years: 5.00    Types: Cigarettes  . Smokeless tobacco: Never Used  Substance Use Topics  . Alcohol use:  Yes    Alcohol/week: 7.0 standard drinks    Types: 2 Cans of beer, 5 Shots of liquor per week  . Drug use: No    Frequency: 1.0 times per week    Review of Systems  Constitutional: Negative for fever. Eyes: Negative for visual changes. ENT: Negative for sore throat. Cardiovascular: Negative for chest pain. Respiratory: Negative for shortness of breath. Gastrointestinal: Negative for abdominal pain, vomiting and diarrhea. Genitourinary: Negative for dysuria. Musculoskeletal: Negative for back pain. Left knee pain Skin: Negative for rash. Neurological: Negative for headaches, focal weakness or numbness. ____________________________________________  PHYSICAL EXAM:  VITAL SIGNS: ED Triage Vitals  Enc Vitals Group     BP 12/08/18 1546 139/83     Pulse Rate 12/08/18 1546 92     Resp 12/08/18 1546 18     Temp 12/08/18 1546 98.6 F (37 C)     Temp Source 12/08/18 1546 Oral     SpO2 12/08/18 1546 98 %     Weight 12/08/18 1547 286 lb (129.7 kg)     Height 12/08/18 1547 5\' 9"  (1.753 m)     Head Circumference --      Peak Flow --      Pain Score 12/08/18 1547 6     Pain Loc --      Pain Edu? --      Excl. in [place]? --     Constitutional: Alert and  oriented. Well appearing and in no distress. Head: Normocephalic and atraumatic. Eyes: Conjunctivae are normal. Normal extraocular movements Cardiovascular: Normal rate, regular rhythm. Normal distal pulses. Respiratory: Normal respiratory effort. No wheezes/rales/rhonchi. Musculoskeletal: Left knee without obvious deformity, dislocation, or effusion.  Normal range of motion without crepitus, laxity, or patellar ballottement.  Patient out any popliteal space fullness or calf or Achilles tenderness distally.  No significant valgus or varus strain stress.  Mildly tender to palpation to the medial lateral joint lines.  He is also mildly tender to palpation around the inferior patella.  Negative anterior/posterior drawer, but provocation  elicits some subjective complaints of pain.  Nontender with normal range of motion in all extremities.  Neurologic:  Normal gait without ataxia. Normal speech and language. No gross focal neurologic deficits are appreciated. Skin:  Skin is warm, dry and intact. No rash noted. ____________________________________________   RADIOLOGY  Left Knee  IMPRESSION: Negative. ____________________________________________  PROCEDURES  Procedures Metoprolol 12.5 mg PO Knee immobilizer ____________________________________________  INITIAL IMPRESSION / ASSESSMENT AND PLAN / ED COURSE  Patient with ED evaluation of intermittent left knee pain and disability.  Knee exam is overall benign without any signs of any acute internal derangement.  X-ray is also reassuring as it shows no acute fracture or dislocation.  Patient symptoms likely represent a mild left knee sprain.  He is placed in a knee immobilizer for support.  He is also given a prescription for Flexeril to take as needed for pain relief.  He is advised at this time to avoid any anti-inflammatory pain medicines as his blood pressure has been poorly controlled.  A courtesy prescription of his metoprolol is also been provided.  Patient is referred to 1 of the community clinics for routine medical care.  He is also given referral to orthopedics for ongoing knee pain.  Return precautions have been reviewed. ____________________________________________  FINAL CLINICAL IMPRESSION(S) / ED DIAGNOSES  Final diagnoses:  Acute pain of left knee  Essential hypertension      Carmie End, Dannielle Karvonen, PA-C 12/08/18 1935    Merlyn Lot, MD 12/08/18 2116

## 2018-12-08 NOTE — ED Notes (Signed)
See triage note  Presents with pain to left knee   States he feels like his knee is giving away

## 2018-12-08 NOTE — ED Triage Notes (Signed)
States L knee pain and feels like will "go out" sometimes " when walking x 2 days. No injury. Also states is concerned because checked blood pressure on girlfriends blood pressure cuff x 2 and got high reading.

## 2019-01-25 ENCOUNTER — Other Ambulatory Visit: Payer: Self-pay

## 2019-01-25 ENCOUNTER — Emergency Department: Payer: PRIVATE HEALTH INSURANCE

## 2019-01-25 ENCOUNTER — Emergency Department
Admission: EM | Admit: 2019-01-25 | Discharge: 2019-01-25 | Disposition: A | Discharge status: Home / Self Care | Payer: PRIVATE HEALTH INSURANCE | Attending: Emergency Medicine | Admitting: Emergency Medicine

## 2019-01-25 DIAGNOSIS — R079 Chest pain, unspecified: Secondary | ICD-10-CM

## 2019-01-25 DIAGNOSIS — R0789 Other chest pain: Secondary | ICD-10-CM | POA: Insufficient documentation

## 2019-01-25 DIAGNOSIS — Z79899 Other long term (current) drug therapy: Secondary | ICD-10-CM | POA: Insufficient documentation

## 2019-01-25 DIAGNOSIS — J029 Acute pharyngitis, unspecified: Secondary | ICD-10-CM | POA: Insufficient documentation

## 2019-01-25 DIAGNOSIS — I10 Essential (primary) hypertension: Secondary | ICD-10-CM | POA: Insufficient documentation

## 2019-01-25 DIAGNOSIS — F17210 Nicotine dependence, cigarettes, uncomplicated: Secondary | ICD-10-CM | POA: Insufficient documentation

## 2019-01-25 DIAGNOSIS — J45909 Unspecified asthma, uncomplicated: Secondary | ICD-10-CM | POA: Insufficient documentation

## 2019-01-25 HISTORY — DX: Essential (primary) hypertension: I10

## 2019-01-25 LAB — CBC
HCT: 47.7 % (ref 39.0–52.0)
Hemoglobin: 15.1 g/dL (ref 13.0–17.0)
MCH: 28.3 pg (ref 26.0–34.0)
MCHC: 31.7 g/dL (ref 30.0–36.0)
MCV: 89.5 fL (ref 80.0–100.0)
Platelets: 264 10*3/uL (ref 150–400)
RBC: 5.33 MIL/uL (ref 4.22–5.81)
RDW: 14 % (ref 11.5–15.5)
WBC: 10.4 10*3/uL (ref 4.0–10.5)
nRBC: 0 % (ref 0.0–0.2)

## 2019-01-25 LAB — BASIC METABOLIC PANEL
ANION GAP: 10 (ref 5–15)
BUN: 9 mg/dL (ref 6–20)
CO2: 22 mmol/L (ref 22–32)
Calcium: 8.8 mg/dL — ABNORMAL LOW (ref 8.9–10.3)
Chloride: 103 mmol/L (ref 98–111)
Creatinine, Ser: 0.94 mg/dL (ref 0.61–1.24)
GFR calc non Af Amer: >60 mL/min (ref >60)
Glucose, Bld: 107 mg/dL — ABNORMAL HIGH (ref 70–99)
Potassium: 3.8 mmol/L (ref 3.5–5.1)
Sodium: 135 mmol/L (ref 135–145)

## 2019-01-25 LAB — GROUP A STREP BY PCR: Group A Strep by PCR: NOT DETECTED

## 2019-01-25 LAB — TROPONIN I: Troponin I: <0.03 ng/mL (ref <0.03)

## 2019-01-25 MED ORDER — HYDROCHLOROTHIAZIDE 25 MG PO TABS: 25.0000 mg | ORAL_TABLET | Freq: Every day | ORAL | 2 refills | Status: DC

## 2019-01-25 MED ORDER — SODIUM CHLORIDE 0.9% FLUSH: 3.0000 mL | Freq: Once | INTRAVENOUS | Status: DC

## 2019-01-25 NOTE — ED Provider Notes (Signed)
[facility] Emergency Department Provider Note  Time seen: 9:22 PM  I have reviewed the triage vital signs and the nursing notes.   HISTORY  Chief Complaint Chest Pain and Sore Throat   HPI Logan Lucas is a 43 y.o. male the past medical history of asthma, hypertension, presents to the emergency department for sore throat and chest discomfort.  According to the patient over the past for 5 days he has had progressively worsening sore throat, states it feels like it could be "strep."  Patient also states a secondary complaint he has been experiencing pain in his left chest is more of a dull tightness, mild in severity has been fairly constant x2 weeks.  Denies any significant cough.  Denies any sputum production.  Denies any known fever.   Past Medical History:  Diagnosis Date  . Asthma   . Hypertension     Patient Active Problem List   Diagnosis Date Noted  . Chest pain 06/17/2018    Past Surgical History:  Procedure Laterality Date  . none      Prior to Admission medications   Medication Sig Start Date End Date Taking? Authorizing Provider  metoprolol tartrate (LOPRESSOR) 25 MG tablet Take 0.5 tablets (12.5 mg total) by mouth 2 (two) times daily. 12/08/18 02/06/19  Menshew, Dannielle Karvonen, PA-C    No Known Allergies  Family History  Problem Relation Age of Onset  . Diabetes Mother   . Cancer Father     Social History Social History   Tobacco Use  . Smoking status: Current Every Day Smoker    Packs/day: 1.00    Years: 5.00    Pack years: 5.00    Types: Cigarettes  . Smokeless tobacco: Never Used  Substance Use Topics  . Alcohol use: Yes    Alcohol/week: 7.0 standard drinks    Types: 2 Cans of beer, 5 Shots of liquor per week  . Drug use: No    Frequency: 1.0 times per week    Review of Systems Constitutional: Negative for fever. ENT: Sore throat x5 days. Cardiovascular: Left chest tightness x2 weeks. Respiratory: Negative  for shortness of breath.  Negative for cough. Gastrointestinal: Negative for abdominal pain, vomiting  Musculoskeletal: Negative for musculoskeletal complaints Neurological: Negative for headache All other ROS negative  ____________________________________________   PHYSICAL EXAM:  VITAL SIGNS: ED Triage Vitals  Enc Vitals Group     BP 01/25/19 1603 (!) 192/117     Pulse Rate 01/25/19 1603 97     Resp 01/25/19 1603 18     Temp 01/25/19 1603 98.3 F (36.8 C)     Temp Source 01/25/19 1603 Oral     SpO2 01/25/19 1603 97 %     Weight 01/25/19 1603 280 lb (127 kg)     Height 01/25/19 1603 5\' 9"  (1.753 m)     Head Circumference --      Peak Flow --      Pain Score 01/25/19 1611 6     Pain Loc --      Pain Edu? --      Excl. in [place]? --     Constitutional: Alert and oriented. Well appearing and in no distress. Eyes: Normal exam ENT   Head: Normocephalic and atraumatic.    Mouth/Throat: Mucous membranes are moist. Cardiovascular: Normal rate, regular rhythm.  Respiratory: Normal respiratory effort without tachypnea nor retractions. Breath sounds are clear  Gastrointestinal: Soft and nontender. No distention.   Musculoskeletal: Nontender with  normal range of motion in all extremities. No lower extremity tenderness or edema. Neurologic:  Normal speech and language. No gross focal neurologic deficits are appreciated. Skin:  Skin is warm, dry and intact.  Psychiatric: Mood and affect are normal. Speech and behavior are normal.   ____________________________________________    EKG  EKG viewed and interpreted by myself shows a normal sinus rhythm at 94 bpm with a narrow QRS, normal axis, normal intervals.  Patient does have inferior T wave inversions.  However this appears largely unchanged since 2017.  Patient states he has had a stress test this year that was normal per patient.  ____________________________________________    RADIOLOGY  Chest x-ray is  negative.  ____________________________________________   INITIAL IMPRESSION / ASSESSMENT AND PLAN / ED COURSE  Pertinent labs & imaging results that were available during my care of the patient were reviewed by me and considered in my medical decision making (see chart for details).  Patient presents to the emergency department for sore throat x5 days as well as chest pain x2 weeks.  Reassuringly patient's chest x-ray is negative.  EKG is unchanged since 2017.  Labs including cardiac enzymes are negative.  Patient states his main concern what brought him in was more the sore throat wanted to make sure it was not strep.  We will check a strep swab and continue to closely monitor.  His work-up is essentially negative.  Strep test is negative.  Patient is concerned about his blood pressure continues to be somewhat elevated currently 150s over 100.  Patient takes 12.5 mg of metoprolol twice daily.  We will add hydrochlorothiazide have the patient follow-up with his PCP.  Patient agreeable to plan of care.  ____________________________________________   FINAL CLINICAL IMPRESSION(S) / ED DIAGNOSES  Pharyngitis Chest pain Hypertension   Harvest Dark, MD 01/25/19 2248

## 2019-01-25 NOTE — ED Notes (Signed)
Sandwich tray given to patient.

## 2019-01-25 NOTE — ED Triage Notes (Signed)
Pt comes via POV from home with c/o chest pain that has been going on for about 2 weeks. Pt also states sore throat that started a few nights ago.  Pt states left sided chest pain. Pt states he feels his heart sometimes skips a beat. Pt denies any SOB.

## 2019-04-13 ENCOUNTER — Emergency Department
Admission: EM | Admit: 2019-04-13 | Discharge: 2019-04-13 | Disposition: A | Discharge status: Home / Self Care | Payer: Self-pay | Attending: Emergency Medicine | Admitting: Emergency Medicine

## 2019-04-13 ENCOUNTER — Encounter: Payer: Self-pay | Admitting: Emergency Medicine

## 2019-04-13 ENCOUNTER — Other Ambulatory Visit: Payer: Self-pay

## 2019-04-13 DIAGNOSIS — J45909 Unspecified asthma, uncomplicated: Secondary | ICD-10-CM | POA: Insufficient documentation

## 2019-04-13 DIAGNOSIS — I10 Essential (primary) hypertension: Secondary | ICD-10-CM | POA: Insufficient documentation

## 2019-04-13 DIAGNOSIS — M62830 Muscle spasm of back: Secondary | ICD-10-CM | POA: Insufficient documentation

## 2019-04-13 DIAGNOSIS — F17210 Nicotine dependence, cigarettes, uncomplicated: Secondary | ICD-10-CM | POA: Insufficient documentation

## 2019-04-13 DIAGNOSIS — Z79899 Other long term (current) drug therapy: Secondary | ICD-10-CM | POA: Insufficient documentation

## 2019-04-13 LAB — CBC WITH DIFFERENTIAL/PLATELET
Abs Immature Granulocytes: 0.13 10*3/uL — ABNORMAL HIGH (ref 0.00–0.07)
Basophils Absolute: 0 10*3/uL (ref 0.0–0.1)
Basophils Relative: 0 %
Eosinophils Absolute: 0.2 10*3/uL (ref 0.0–0.5)
Eosinophils Relative: 2 %
HCT: 40 % (ref 39.0–52.0)
Hemoglobin: 13.3 g/dL (ref 13.0–17.0)
Immature Granulocytes: 1 %
Lymphocytes Relative: 23 %
Lymphs Abs: 2.4 10*3/uL (ref 0.7–4.0)
MCH: 29.4 pg (ref 26.0–34.0)
MCHC: 33.3 g/dL (ref 30.0–36.0)
MCV: 88.3 fL (ref 80.0–100.0)
Monocytes Absolute: 0.8 10*3/uL (ref 0.1–1.0)
Monocytes Relative: 8 %
Neutro Abs: 6.7 10*3/uL (ref 1.7–7.7)
Neutrophils Relative %: 66 %
Platelets: 266 10*3/uL (ref 150–400)
RBC: 4.53 MIL/uL (ref 4.22–5.81)
RDW: 15.3 % (ref 11.5–15.5)
WBC: 10.3 10*3/uL (ref 4.0–10.5)
nRBC: 0.3 % — ABNORMAL HIGH (ref 0.0–0.2)

## 2019-04-13 LAB — COMPREHENSIVE METABOLIC PANEL
ALT: 47 U/L — ABNORMAL HIGH (ref 0–44)
AST: 41 U/L (ref 15–41)
Albumin: 4.1 g/dL (ref 3.5–5.0)
Alkaline Phosphatase: 100 U/L (ref 38–126)
Anion gap: 10 (ref 5–15)
BUN: 16 mg/dL (ref 6–20)
CO2: 23 mmol/L (ref 22–32)
Calcium: 9.2 mg/dL (ref 8.9–10.3)
Chloride: 102 mmol/L (ref 98–111)
Creatinine, Ser: 1.18 mg/dL (ref 0.61–1.24)
GFR calc Af Amer: >60 mL/min (ref >60)
GFR calc non Af Amer: >60 mL/min (ref >60)
Glucose, Bld: 168 mg/dL — ABNORMAL HIGH (ref 70–99)
Potassium: 3.5 mmol/L (ref 3.5–5.1)
Sodium: 135 mmol/L (ref 135–145)
Total Bilirubin: 0.6 mg/dL (ref 0.3–1.2)
Total Protein: 7.1 g/dL (ref 6.5–8.1)

## 2019-04-13 MED ORDER — METOPROLOL TARTRATE 25 MG PO TABS: 12.5000 mg | ORAL_TABLET | Freq: Two times a day (BID) | ORAL | 1 refills | Status: DC

## 2019-04-13 MED ORDER — CYCLOBENZAPRINE HCL 10 MG PO TABS
10.0000 mg | ORAL_TABLET | Freq: Once | ORAL | Status: AC
  Administered 2019-04-13: 10 mg via ORAL
  Filled 2019-04-13: qty 1

## 2019-04-13 MED ORDER — CYCLOBENZAPRINE HCL 10 MG PO TABS: 10.0000 mg | ORAL_TABLET | Freq: Three times a day (TID) | ORAL | 0 refills | Status: DC | PRN

## 2019-04-13 NOTE — ED Provider Notes (Signed)
[facility] Emergency Department Provider Note  ____________________________________________   First MD Initiated Contact with Patient 04/13/19 1228     (approximate)  I have reviewed the triage vital signs and the nursing notes.   HISTORY  Chief Complaint Back Pain    HPI Logan Lucas is a 43 y.o. male presents emergency department complaint of mid back and lower back muscle spasms that radiate into the shoulder blades.  States he started having spasms on Saturday but did not have an injury.  He has some numbness and tingling to the right fourth and fifth fingers but that started approximately 2 weeks ago.  No known injury.  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  He states he is right-handed and did multiple tight jobs that caused repetitive motions.    Past Medical History:  Diagnosis Date  . Asthma   . Hypertension     Patient Active Problem List   Diagnosis Date Noted  . Chest pain 06/17/2018    Past Surgical History:  Procedure Laterality Date  . none      Prior to Admission medications   Medication Sig Start Date End Date Taking? Authorizing Provider  cyclobenzaprine (FLEXERIL) 10 MG tablet Take 1 tablet (10 mg total) by mouth 3 (three) times daily as needed. 04/13/19   Fisher, Linden Dolin, PA-C  hydrochlorothiazide (HYDRODIURIL) 25 MG tablet Take 1 tablet (25 mg total) by mouth daily. 01/25/19   Harvest Dark, MD  metoprolol tartrate (LOPRESSOR) 25 MG tablet Take 0.5 tablets (12.5 mg total) by mouth 2 (two) times daily. 04/13/19 06/12/19  Versie Starks, PA-C    Allergies Patient has no known allergies.  Family History  Problem Relation Age of Onset  . Diabetes Mother   . Cancer Father     Social History Social History   Tobacco Use  . Smoking status: Current Every Day Smoker    Packs/day: 1.00    Years: 5.00    Pack years: 5.00    Types: Cigarettes  . Smokeless tobacco: Never Used  Substance Use Topics  .  Alcohol use: Yes    Alcohol/week: 7.0 standard drinks    Types: 2 Cans of beer, 5 Shots of liquor per week  . Drug use: No    Frequency: 1.0 times per week    Review of Systems  Constitutional: No fever/chills Eyes: No visual changes. ENT: No sore throat. Respiratory: Denies cough Genitourinary: Negative for dysuria. Musculoskeletal: Negative for back pain.  Positive muscle spasms Skin: Negative for rash.    ____________________________________________   PHYSICAL EXAM:  VITAL SIGNS: ED Triage Vitals  Enc Vitals Group     BP 04/13/19 1213 120/81     Pulse Rate 04/13/19 1213 (!) 116     Resp 04/13/19 1213 18     Temp 04/13/19 1213 98.6 F (37 C)     Temp Source 04/13/19 1213 Oral     SpO2 04/13/19 1213 98 %     Weight 04/13/19 1206 263 lb (119.3 kg)     Height 04/13/19 1206 5' 9" (1.753 m)     Head Circumference --      Peak Flow --      Pain Score 04/13/19 1206 7     Pain Loc --      Pain Edu? --      Excl. in [place]? --     Constitutional: Alert and oriented. Well appearing and in no acute distress. Eyes: Conjunctivae are normal.  Head: Atraumatic. Nose: No congestion/rhinnorhea. Mouth/Throat: Mucous membranes are moist.   Neck:  supple no lymphadenopathy noted Cardiovascular: Normal rate, regular rhythm. Heart sounds are normal Respiratory: Normal respiratory effort.  No retractions, lungs c t a  GU: deferred Musculoskeletal: FROM all extremities, warm and well perfused, spasms noted in the mid to lower back. Neurologic:  Normal speech and language.  Skin:  Skin is warm, dry and intact. No rash noted. Psychiatric: Mood and affect are normal. Speech and behavior are normal.  ____________________________________________   LABS (all labs ordered are listed, but only abnormal results are displayed)  Labs Reviewed  COMPREHENSIVE METABOLIC PANEL - Abnormal; Notable for the following components:      Result Value   Glucose, Bld 168 (*)    ALT 47 (*)    All  other components within normal limits  CBC WITH DIFFERENTIAL/PLATELET - Abnormal; Notable for the following components:   nRBC 0.3 (*)    Abs Immature Granulocytes 0.13 (*)    All other components within normal limits   ____________________________________________   ____________________________________________  RADIOLOGY    ____________________________________________   PROCEDURES  Procedure(s) performed: Flexeril 1 p.o.   Procedures    ____________________________________________   INITIAL IMPRESSION / ASSESSMENT AND PLAN / ED COURSE  Pertinent labs & imaging results that were available during my care of the patient were reviewed by me and considered in my medical decision making (see chart for details).   Patient is 43 year old male presents emergency department complaining of muscle spasms and cramps.  Physical exam shows several spasms noted in the mid to lower back   Explained findings to the patient.  Due to his history of being on a diuretic the CBC and metabolic panel were ordered to ensure his electrolytes are normal.  Electrolytes are normal and the blood cell counts are normal.  Patient was given an additional prescription of Flexeril.  He is also requesting a refill on his metoprolol.  Both were sent to the pharmacy.  He was discharged in stable condition.  As part of my medical decision making, I reviewed the following data within the [place] notes reviewed and incorporated, Labs reviewed CBC Met c are both normal, Old chart reviewed, Notes from prior ED visits and [place] Controlled Substance Database  ____________________________________________   FINAL CLINICAL IMPRESSION(S) / ED DIAGNOSES  Final diagnoses:  Muscle spasm of back      NEW MEDICATIONS STARTED DURING THIS VISIT:  Discharge Medication List as of 04/13/2019  1:34 PM    START taking these medications   Details  cyclobenzaprine (FLEXERIL) 10 MG tablet Take 1  tablet (10 mg total) by mouth 3 (three) times daily as needed., Starting Mon 04/13/2019, Normal         Note:  This document was prepared using Dragon voice recognition software and may include unintentional dictation errors.    Versie Starks, PA-C 04/13/19 1503    Earleen Newport, MD 04/13/19 [phone]

## 2019-04-13 NOTE — Discharge Instructions (Addendum)
Follow-up with your regular doctor return emergency department worsening.  Take medications as prescribed.

## 2019-04-13 NOTE — ED Triage Notes (Signed)
C/O mid to lower back pain x 1 day.  Also c/o several week history of right fifth finger numbness.

## 2019-04-13 NOTE — ED Notes (Signed)
See triage note  Presents with pain to mid back and moves up under shoulder blades   Pain started on Saturday w/o injury  Also having some numbness and tingling to right 4th and 5 th fingers and lateral hand   Those sx's started about 2 weeks ago

## 2019-07-06 ENCOUNTER — Other Ambulatory Visit: Payer: Self-pay

## 2019-07-06 ENCOUNTER — Emergency Department
Admission: EM | Admit: 2019-07-06 | Discharge: 2019-07-06 | Disposition: A | Discharge status: Home / Self Care | Payer: Self-pay | Attending: Emergency Medicine | Admitting: Emergency Medicine

## 2019-07-06 ENCOUNTER — Encounter: Payer: Self-pay | Admitting: Emergency Medicine

## 2019-07-06 DIAGNOSIS — Z91199 Patient's noncompliance with other medical treatment and regimen due to unspecified reason: Secondary | ICD-10-CM

## 2019-07-06 DIAGNOSIS — F17210 Nicotine dependence, cigarettes, uncomplicated: Secondary | ICD-10-CM | POA: Insufficient documentation

## 2019-07-06 DIAGNOSIS — K0889 Other specified disorders of teeth and supporting structures: Secondary | ICD-10-CM | POA: Insufficient documentation

## 2019-07-06 DIAGNOSIS — I10 Essential (primary) hypertension: Secondary | ICD-10-CM | POA: Insufficient documentation

## 2019-07-06 DIAGNOSIS — J45909 Unspecified asthma, uncomplicated: Secondary | ICD-10-CM | POA: Insufficient documentation

## 2019-07-06 DIAGNOSIS — Z9119 Patient's noncompliance with other medical treatment and regimen: Secondary | ICD-10-CM | POA: Insufficient documentation

## 2019-07-06 MED ORDER — IBUPROFEN 600 MG PO TABS: 600.0000 mg | ORAL_TABLET | Freq: Three times a day (TID) | ORAL | 0 refills | Status: DC | PRN

## 2019-07-06 MED ORDER — AMOXICILLIN 875 MG PO TABS: 875.0000 mg | ORAL_TABLET | Freq: Two times a day (BID) | ORAL | 0 refills | Status: DC

## 2019-07-06 MED ORDER — LIDOCAINE-EPINEPHRINE 2 %-1:100000 IJ SOLN
1.7000 mL | Freq: Once | INTRAMUSCULAR | Status: AC
  Administered 2019-07-06: 1.7 mL
  Filled 2019-07-06: qty 1.7

## 2019-07-06 MED ORDER — TRAMADOL HCL 50 MG PO TABS: 50.0000 mg | ORAL_TABLET | Freq: Four times a day (QID) | ORAL | 0 refills | Status: DC | PRN

## 2019-07-06 MED ORDER — HYDROCHLOROTHIAZIDE 25 MG PO TABS: 25.0000 mg | ORAL_TABLET | Freq: Every day | ORAL | 0 refills | Status: DC

## 2019-07-06 MED ORDER — METOPROLOL TARTRATE 25 MG PO TABS: 12.5000 mg | ORAL_TABLET | Freq: Two times a day (BID) | ORAL | 0 refills | Status: DC

## 2019-07-06 NOTE — ED Notes (Signed)
See triage note  Developed tooth pain couple of days ago  Pain increased this am   States he thinks his cap came loose  Denies any injury

## 2019-07-06 NOTE — Discharge Instructions (Addendum)
Follow-up with your dentist listed on your discharge papers.  Also the dental clinic at [facility] takes walk-in patients.  Information about the walk-in clinic is on a separate sheet of paper.  You will need to call and make an appointment with the other clinics.  Begin taking the antibiotic as directed.  Also the ibuprofen is for pain and inflammation to be taken with food.  OPTIONS FOR DENTAL FOLLOW UP CARE  [place] [facility] and [place] [email or web address] [phone])  [facility] [phone])  [place] [phone] ext [phone])  [place] [phone])  [facility] [phone]) This clinic caters to the indigent population and is on a lottery system. Location: [employer] [employer] [facility], [facility], [place], [place] [facility] Hours: Wednesdays from 6pm - 9pm, patients seen by a lottery system. For dates, call or go to [email or web address] Services: Cleanings, fillings and simple extractions. Payment Options: DENTAL WORK IS FREE OF CHARGE. Bring proof of income or support. Best way to get seen: Arrive at 5:15 pm - this is a lottery, NOT first come/first serve, so arriving earlier will not increase your chances of being seen.     [facility] [phone] Select option 1 for emergencies   Location: [facility], [place], [address] [place] [facility] Hours: No walk-ins accepted - call the day before to schedule an appointment. Check in times are 9:30 am and 1:30 pm. Services: Simple extractions, temporary fillings, pulpectomy/pulp debridement, uncomplicated abscess drainage. Payment Options: PAYMENT IS DUE AT THE TIME OF SERVICE.  Fee is usually $100-200, additional surgical procedures (e.g. abscess drainage) may be  extra. Cash, checks, Visa/MasterCard accepted.  Can file Medicaid if patient is covered for dental - patient should call case worker to check. No discount for [facility] patients. Best way to get seen: MUST call the day before and get onto the schedule. Can usually be seen the next 1-2 days. No walk-ins accepted.     [place] [phone]   Location: [place], [place] [facility] Hours: M, W, Th, F 8am or 1:30pm, Tues 9a or 1:30 - first come/first served. Services: Simple extractions, temporary fillings, uncomplicated abscess drainage.  You do not need to be an [facility] resident. Payment Options: PAYMENT IS DUE AT THE TIME OF SERVICE. Dental insurance, otherwise sliding scale - bring proof of income or support. Depending on income and treatment needed, cost is usually $50-200. Best way to get seen: Arrive early as it is first come/first served.     [facility] [phone]   Location: [place] [facility] Hours: Mon-Thu 8a-5p Services: Most basic dental services including extractions and fillings. Payment Options: PAYMENT IS DUE AT THE TIME OF SERVICE. Sliding scale, up to 50% off - bring proof if income or support. Medicaid with dental option accepted. Best way to get seen: Call to schedule an appointment, can usually be seen within 2 weeks OR they will try to see walk-ins - show up at [place] or 2p (you may have to wait).     [facility] [place] RESIDENTS ONLY   Location: [facility] [place] [address] [place], [place] [address] Clinic Hours: By appointment only. Monday - Thursday 8am-5pm, Friday 8am-12pm Services: Cleanings, fillings, extractions. Payment Options: PAYMENT IS DUE AT THE TIME OF  SERVICE. Cash, Visa or MasterCard. Sliding scale - $30 minimum per service. Best way to get seen: Come in to office,  complete packet and make an appointment - need proof of income or support monies for each household member and proof of [facility] residence. Usually takes about a month to get in.     [facility] [phone]   Location: [address] [place] [facility] Hours: Walk-in Urgent Care Dental Services are offered Monday-Friday mornings only. The numbers of emergencies accepted daily is limited to the number of providers available. Maximum 15 - Mondays, Wednesdays & Thursdays Maximum 10 - Tuesdays & Fridays Services: You do not need to be a [facility] resident to be seen for a dental emergency. Emergencies are defined as pain, swelling, abnormal bleeding, or dental trauma. Walkins will receive x-rays if needed. NOTE: Dental cleaning is not an emergency. Payment Options: PAYMENT IS DUE AT THE TIME OF SERVICE. Minimum co-pay is $40.00 for uninsured patients. Minimum co-pay is $3.00 for Medicaid with dental coverage. Dental Insurance is accepted and must be presented at time of visit. Medicare does not cover dental. Forms of payment: Cash, credit card, checks. Best way to get seen: If not previously registered with the clinic, walk-in dental registration begins at 7:15 am and is on a first come/first serve basis. If previously registered with the clinic, call to make an appointment.     [facility] [place] ONLY   Location: [address], [place], [place] Clinic Hours: Mon-Thu 10a-2p Services: Extractions only! Payment Options: FREE (donations accepted) - bring proof of income or support Best way to get seen: Call and schedule an appointment OR come at 8am on the 1st Monday of every month (except for holidays) when it is first come/first served.     [facility] [phone]   Location: [place], [place] Clinic Hours: Friday mornings Services, Payment Options, Best way to get seen: Call for  info

## 2019-07-06 NOTE — ED Triage Notes (Signed)
Toothache. Since saturday

## 2019-07-06 NOTE — ED Provider Notes (Signed)
[facility] Emergency Department Provider Note   ____________________________________________   First MD Initiated Contact with Patient 07/06/19 1437     (approximate)  I have reviewed the triage vital signs and the nursing notes.   HISTORY  Chief Complaint Dental Pain   HPI Logan Lucas is a 42 y.o. male presents to the ED with complaint of dental pain for couple days.  Patient states that his pain increased this morning.  He denies any recent injury.  He states he thinks "a cap came loose".  He is not currently have a dentist.  He rates his pain as a 10/10.      Past Medical History:  Diagnosis Date  . Asthma   . Hypertension     Patient Active Problem List   Diagnosis Date Noted  . Chest pain 06/17/2018    Past Surgical History:  Procedure Laterality Date  . none      Prior to Admission medications   Medication Sig Start Date End Date Taking? Authorizing Provider  amoxicillin (AMOXIL) 875 MG tablet Take 1 tablet (875 mg total) by mouth 2 (two) times daily. 07/06/19   Johnn Hai, PA-C  hydrochlorothiazide (HYDRODIURIL) 25 MG tablet Take 1 tablet (25 mg total) by mouth daily. 07/06/19   Johnn Hai, PA-C  metoprolol tartrate (LOPRESSOR) 25 MG tablet Take 0.5 tablets (12.5 mg total) by mouth 2 (two) times daily. 07/06/19 09/04/19  Johnn Hai, PA-C  traMADol (ULTRAM) 50 MG tablet Take 1 tablet (50 mg total) by mouth every 6 (six) hours as needed. 07/06/19   Johnn Hai, PA-C    Allergies Patient has no known allergies.  Family History  Problem Relation Age of Onset  . Diabetes Mother   . Cancer Father     Social History Social History   Tobacco Use  . Smoking status: Current Every Day Smoker    Packs/day: 1.00    Years: 5.00    Pack years: 5.00    Types: Cigarettes  . Smokeless tobacco: Never Used  Substance Use Topics  . Alcohol use: Yes    Alcohol/week: 7.0 standard drinks    Types: 2 Cans of  beer, 5 Shots of liquor per week  . Drug use: No    Frequency: 1.0 times per week    Review of Systems Constitutional: No fever/chills Eyes: No visual changes. ENT: No sore throat.  Positive dental pain. Cardiovascular: Denies chest pain. Respiratory: Denies shortness of breath. Musculoskeletal: Negative for muscle skeletal pain. Neurological: Negative for headaches ____________________________________________   PHYSICAL EXAM:  VITAL SIGNS: ED Triage Vitals  Enc Vitals Group     BP 07/06/19 1411 (!) 167/97     Pulse Rate 07/06/19 1411 100     Resp 07/06/19 1411 20     Temp 07/06/19 1411 (!) 97.5 F (36.4 C)     Temp Source 07/06/19 1411 Oral     SpO2 07/06/19 1411 95 %     Weight 07/06/19 1412 275 lb (124.7 kg)     Height 07/06/19 1412 5\' 9"  (1.753 m)     Head Circumference --      Peak Flow --      Pain Score --      Pain Loc --      Pain Edu? --      Excl. in [place]? --    Constitutional: Alert and oriented. Well appearing and in no acute distress. Eyes: Conjunctivae are normal.  Head: Atraumatic. Mouth/Throat:  Mucous membranes are moist.  Oropharynx non-erythematous.  Moderate tenderness is noted to the gums surrounding a premolar on the right lower aspect.  There is no Involved in this tooth.  Patient has a filling on the posterior aspect of the tooth.  No gross abscess or drainage is noted. Neck: No stridor.   Hematological/Lymphatic/Immunilogical: No cervical lymphadenopathy. Cardiovascular: Normal rate, regular rhythm. Grossly normal heart sounds.  Good peripheral circulation. Respiratory: Normal respiratory effort.  No retractions. Lungs CTAB. Musculoskeletal: Moves upper and lower extremities without any difficulty.  Normal gait was noted. Neurologic:  Normal speech and language. No gross focal neurologic deficits are appreciated. Skin:  Skin is warm, dry and intact.  Psychiatric: Mood and affect are normal. Speech and behavior are normal.   ____________________________________________   LABS (all labs ordered are listed, but only abnormal results are displayed)  Labs Reviewed - No data to display  PROCEDURES  Procedure(s) performed (including Critical Care):  Procedures  Dental block was performed by this provider. ____________________________________________   INITIAL IMPRESSION / ASSESSMENT AND PLAN / ED COURSE  As part of my medical decision making, I reviewed the following data within the electronic MEDICAL RECORD NUMBER Notes from prior ED visits and [place] Controlled Substance Database  43 year old male presents to the ED with complaint of dental pain.  Patient states that he thought that they Had come off however on inspection there is no Involved with the tooth that he complains of dental pain.  There is no obvious abscess.  There is a filling on the posterior aspect of this particular tooth.  Gum is tender to touch.  Patient was given a list of dental clinics including information about the walk-in clinic at [facility] and encouraged to follow-up.  He was given a prescription for amoxicillin 875 twice daily for 10 days and ibuprofen.  Prior to discharge patient states that he needs refills on his blood pressure medication as he does not have a PCP.  It appears that each time he comes to the ED he gets medications for his hypertension without following up with a PCP.  I tried explaining to him that there is more to controlling hypertension than just taking a pill.  That routine visits to a steady physician eliminate problems with his kidneys, eyesight and stroke however patient did not want to hear this.  He still was given a list of clinics in the area that are either at a lower cost or free [facility].  His prescriptions were sent to the pharmacy for 30 days.  Patient states that he had ran out of medication for his hypertension but would not elaborate on how long he has been out.   ____________________________________________   FINAL CLINICAL IMPRESSION(S) / ED DIAGNOSES  Final diagnoses:  Pain, dental  Hypertension, unspecified type  Medically noncompliant     ED Discharge Orders         Ordered    amoxicillin (AMOXIL) 875 MG tablet  2 times daily     07/06/19 1502    ibuprofen (ADVIL) 600 MG tablet  Every 8 hours PRN,   Status:  Discontinued     07/06/19 1502    hydrochlorothiazide (HYDRODIURIL) 25 MG tablet  Daily     07/06/19 1517    metoprolol tartrate (LOPRESSOR) 25 MG tablet  2 times daily     07/06/19 1517    traMADol (ULTRAM) 50 MG tablet  Every 6 hours PRN     07/06/19 1517  Note:  This document was prepared using Dragon voice recognition software and may include unintentional dictation errors.    Johnn Hai, PA-C 07/06/19 1523    Earleen Newport, MD 07/10/19 0700

## 2019-07-08 ENCOUNTER — Emergency Department: Payer: Self-pay

## 2019-07-08 ENCOUNTER — Emergency Department
Admission: EM | Admit: 2019-07-08 | Discharge: 2019-07-08 | Disposition: A | Discharge status: Home / Self Care | Payer: Self-pay | Attending: Emergency Medicine | Admitting: Emergency Medicine

## 2019-07-08 ENCOUNTER — Other Ambulatory Visit: Payer: Self-pay

## 2019-07-08 ENCOUNTER — Encounter: Payer: Self-pay | Admitting: Emergency Medicine

## 2019-07-08 DIAGNOSIS — Z79899 Other long term (current) drug therapy: Secondary | ICD-10-CM | POA: Insufficient documentation

## 2019-07-08 DIAGNOSIS — I10 Essential (primary) hypertension: Secondary | ICD-10-CM | POA: Insufficient documentation

## 2019-07-08 DIAGNOSIS — K0889 Other specified disorders of teeth and supporting structures: Secondary | ICD-10-CM | POA: Insufficient documentation

## 2019-07-08 DIAGNOSIS — F17210 Nicotine dependence, cigarettes, uncomplicated: Secondary | ICD-10-CM | POA: Insufficient documentation

## 2019-07-08 DIAGNOSIS — R1310 Dysphagia, unspecified: Secondary | ICD-10-CM | POA: Insufficient documentation

## 2019-07-08 DIAGNOSIS — J45909 Unspecified asthma, uncomplicated: Secondary | ICD-10-CM | POA: Insufficient documentation

## 2019-07-08 MED ORDER — LIDOCAINE VISCOUS HCL 2 % MT SOLN: 5.0000 mL | Freq: Four times a day (QID) | OROMUCOSAL | 0 refills | Status: DC | PRN

## 2019-07-08 MED ORDER — METHYLPREDNISOLONE 4 MG PO TBPK: ORAL_TABLET | ORAL | 0 refills | Status: DC

## 2019-07-08 NOTE — ED Triage Notes (Signed)
PT c/o RT sided facial swelling. PT states he was seen xfew days ago and given antibiotics. PT states he woke up this am with swelling and states unable to swallow normally. Denies any tongue swelling or swelling in throat. VSS

## 2019-07-08 NOTE — ED Provider Notes (Signed)
[facility] Emergency Department Provider Note   ____________________________________________   First MD Initiated Contact with Patient 07/08/19 1545     (approximate)  I have reviewed the triage vital signs and the nursing notes.   HISTORY  Chief Complaint Dental Pain    HPI Logan Lucas Lucas is a 43 y.o. male patient complain of right facial swelling.  Patient seen at this facility few days ago and given antibiotics for dental infection.  Patient states the swelling start receding yesterday but awakened this morning with increased swelling.  Patient state unable to swallow food normally.  Patient states tramadol is not helping his pain.  Patient rates pain as 8/10.  Describes the pain as "achy".  Patient state is able to tolerate fluids and soft food.         Past Medical History:  Diagnosis Date  . Asthma   . Hypertension     Patient Active Problem List   Diagnosis Date Noted  . Chest pain 06/17/2018    Past Surgical History:  Procedure Laterality Date  . none      Prior to Admission medications   Medication Sig Start Date End Date Taking? Authorizing Provider  amoxicillin (AMOXIL) 875 MG tablet Take 1 tablet (875 mg total) by mouth 2 (two) times daily. 07/06/19   Johnn Hai, PA-C  hydrochlorothiazide (HYDRODIURIL) 25 MG tablet Take 1 tablet (25 mg total) by mouth daily. 07/06/19   Johnn Hai, PA-C  lidocaine (XYLOCAINE) 2 % solution Use as directed 5 mLs in the mouth or throat every 6 (six) hours as needed for mouth pain. 07/08/19   Sable Feil, PA-C  methylPREDNISolone (MEDROL DOSEPAK) 4 MG TBPK tablet Take Tapered dose as directed 07/08/19   Sable Feil, PA-C  metoprolol tartrate (LOPRESSOR) 25 MG tablet Take 0.5 tablets (12.5 mg total) by mouth 2 (two) times daily. 07/06/19 09/04/19  Johnn Hai, PA-C  traMADol (ULTRAM) 50 MG tablet Take 1 tablet (50 mg total) by mouth every 6 (six) hours as needed. 07/06/19    Johnn Hai, PA-C    Allergies Patient has no known allergies.  Family History  Problem Relation Age of Onset  . Diabetes Mother   . Cancer Father     Social History Social History   Tobacco Use  . Smoking status: Current Every Day Smoker    Packs/day: 1.00    Years: 5.00    Pack years: 5.00    Types: Cigarettes  . Smokeless tobacco: Never Used  Substance Use Topics  . Alcohol use: Yes    Alcohol/week: 7.0 standard drinks    Types: 2 Cans of beer, 5 Shots of liquor per week  . Drug use: No    Frequency: 1.0 times per week    Review of Systems  Constitutional: No fever/chills Eyes: No visual changes. ENT: Sore throat.  Dental pain Cardiovascular: Denies chest pain. Respiratory: Denies shortness of breath. Gastrointestinal: No abdominal pain.  No nausea, no vomiting.  No diarrhea.  No constipation. Genitourinary: Negative for dysuria. Musculoskeletal: Negative for back pain. Skin: Negative for rash. Neurological: Negative for headaches, focal weakness or numbness. Endocrine:  Hypertension   ____________________________________________   PHYSICAL EXAM:  VITAL SIGNS: ED Triage Vitals  Enc Vitals Group     BP 07/08/19 1511 (!) 152/109     Pulse Rate 07/08/19 1511 100     Resp 07/08/19 1511 16     Temp 07/08/19 1511 98.6 F (37 C)  Temp Source 07/08/19 1511 Oral     SpO2 07/08/19 1511 98 %     Weight --      Height --      Head Circumference --      Peak Flow --      Pain Score 07/08/19 1512 8     Pain Loc --      Pain Edu? --      Excl. in [place]? --    Constitutional: Alert and oriented. Well appearing and in no acute distress. Mouth/Throat: Mucous membranes are moist.  Oropharynx non-erythematous.  Dental caries. Neck: No stridor.   Hematological/Lymphatic/Immunilogical: No cervical lymphadenopathy. Cardiovascular: Normal rate, regular rhythm. Grossly normal heart sounds.  Good peripheral circulation. Respiratory: Normal respiratory effort.   No retractions. Lungs CTAB. Skin:  Skin is warm, dry and intact. No rash noted. Psychiatric: Mood and affect are normal. Speech and behavior are normal.  ____________________________________________   LABS (all labs ordered are listed, but only abnormal results are displayed)  Labs Reviewed - No data to display ____________________________________________  EKG   ____________________________________________  RADIOLOGY  ED MD interpretation:    Official radiology report(s): Dg Neck Soft Tissue  Result Date: 07/08/2019 CLINICAL DATA:  Dysphagia EXAM: NECK SOFT TISSUES - 1+ VIEW COMPARISON:  None. FINDINGS: There is no evidence of retropharyngeal soft tissue swelling or epiglottic enlargement. The cervical airway is unremarkable and no radio-opaque foreign body identified. IMPRESSION: Negative. Electronically Signed   By: Donavan Foil M.D.   On: 07/08/2019 16:32    ____________________________________________   PROCEDURES  Procedure(s) performed (including Critical Care):  Procedures   ____________________________________________   INITIAL IMPRESSION / ASSESSMENT AND PLAN / ED COURSE  As part of my medical decision making, I reviewed the following data within the [name] Lucas was evaluated in Emergency Department on 07/08/2019 for the symptoms described in the history of present illness. He was evaluated in the context of the global COVID-19 pandemic, which necessitated consideration that the patient might be at risk for infection with the SARS-CoV-2 virus that causes COVID-19. Institutional protocols and algorithms that pertain to the evaluation of patients at risk for COVID-19 are in a state of rapid change based on information released by regulatory bodies including the CDC and federal and state organizations. These policies and algorithms were followed during the patient's care in the ED.     Patient presents with vague complaint  of pain with swallowing.  Patient was seen 2 days ago for dental pain and is upset because stronger pain medications not provided.  Since last visit patient as not contacted the dental clinics as directed.  Physical exam was grossly unremarkable except for dental caries.  No appreciated cervical adenopathy or nuchal masses.  Discussed negative findings on soft tissue neck x-ray.  Patient advised continue previous medication.  Patient given a prescription of viscous lidocaine and Medrol Dosepak.  Patient advised to seek dental care from list given on previous discharge.       ____________________________________________   FINAL CLINICAL IMPRESSION(S) / ED DIAGNOSES  Final diagnoses:  Pain, dental  Dysphagia, unspecified type     ED Discharge Orders         Ordered    methylPREDNISolone (MEDROL DOSEPAK) 4 MG TBPK tablet     07/08/19 1639    lidocaine (XYLOCAINE) 2 % solution  Every 6 hours PRN     07/08/19 1641  Note:  This document was prepared using Dragon voice recognition software and may include unintentional dictation errors.    Sable Feil, PA-C 07/08/19 1654    Merlyn Lot, MD 07/09/19 [phone]

## 2019-07-08 NOTE — Discharge Instructions (Signed)
Continue previous medication.  Advised to continue to seek dental care from list of dental clinics  provided on your last discharge instructions.  Start steroids and use viscous lidocaine as needed for dental pain.

## 2019-07-08 NOTE — ED Notes (Signed)
When this RN went to discharge pt, pt asked for a note for work- when this RN returned with note, pt balled it up and threw up at the wall stating that he "didn't need that doctor note" and "I'm expected to go to work with pain?"- pt became more irritated after picking up dr note saying "actually, I do need that so I can know his name. Because if something else happens to me he will be hearing from my lawyer for malpractice" and stormed out of room

## 2019-07-08 NOTE — ED Notes (Signed)
Spoke with Suanne Marker, [place] about pt presentation. States she will see him in [facility]

## 2019-07-09 ENCOUNTER — Other Ambulatory Visit: Payer: Self-pay

## 2019-07-09 ENCOUNTER — Encounter: Payer: Self-pay | Admitting: Emergency Medicine

## 2019-07-09 DIAGNOSIS — J45909 Unspecified asthma, uncomplicated: Secondary | ICD-10-CM | POA: Insufficient documentation

## 2019-07-09 DIAGNOSIS — I10 Essential (primary) hypertension: Secondary | ICD-10-CM | POA: Insufficient documentation

## 2019-07-09 DIAGNOSIS — M272 Inflammatory conditions of jaws: Secondary | ICD-10-CM | POA: Insufficient documentation

## 2019-07-09 DIAGNOSIS — Z79899 Other long term (current) drug therapy: Secondary | ICD-10-CM | POA: Insufficient documentation

## 2019-07-09 DIAGNOSIS — F17210 Nicotine dependence, cigarettes, uncomplicated: Secondary | ICD-10-CM | POA: Insufficient documentation

## 2019-07-09 NOTE — ED Triage Notes (Signed)
Pt seen x3 over last week due to dental pain and increased swelling under chin. Pt able to speak in complete sentences, airway clear.

## 2019-07-10 ENCOUNTER — Emergency Department
Admission: EM | Admit: 2019-07-10 | Discharge: 2019-07-10 | Disposition: A | Discharge status: Home / Self Care | Payer: Self-pay | Attending: Emergency Medicine | Admitting: Emergency Medicine

## 2019-07-10 ENCOUNTER — Emergency Department: Payer: Self-pay

## 2019-07-10 DIAGNOSIS — L02811 Cutaneous abscess of head [any part, except face]: Secondary | ICD-10-CM

## 2019-07-10 DIAGNOSIS — L0211 Cutaneous abscess of neck: Secondary | ICD-10-CM

## 2019-07-10 DIAGNOSIS — M272 Inflammatory conditions of jaws: Secondary | ICD-10-CM

## 2019-07-10 LAB — BASIC METABOLIC PANEL
Anion gap: 11 (ref 5–15)
BUN: 11 mg/dL (ref 6–20)
CO2: 22 mmol/L (ref 22–32)
Calcium: 9.1 mg/dL (ref 8.9–10.3)
Chloride: 103 mmol/L (ref 98–111)
Creatinine, Ser: 1.1 mg/dL (ref 0.61–1.24)
GFR calc Af Amer: >60 mL/min (ref >60)
GFR calc non Af Amer: >60 mL/min (ref >60)
Glucose, Bld: 162 mg/dL — ABNORMAL HIGH (ref 70–99)
Potassium: 3.5 mmol/L (ref 3.5–5.1)
Sodium: 136 mmol/L (ref 135–145)

## 2019-07-10 LAB — CBC WITH DIFFERENTIAL/PLATELET
Abs Immature Granulocytes: 0.06 10*3/uL (ref 0.00–0.07)
Basophils Absolute: 0 10*3/uL (ref 0.0–0.1)
Basophils Relative: 0 %
Eosinophils Absolute: 0.2 10*3/uL (ref 0.0–0.5)
Eosinophils Relative: 2 %
HCT: 43.2 % (ref 39.0–52.0)
Hemoglobin: 14.4 g/dL (ref 13.0–17.0)
Immature Granulocytes: 1 %
Lymphocytes Relative: 14 %
Lymphs Abs: 1.6 10*3/uL (ref 0.7–4.0)
MCH: 30.3 pg (ref 26.0–34.0)
MCHC: 33.3 g/dL (ref 30.0–36.0)
MCV: 90.9 fL (ref 80.0–100.0)
Monocytes Absolute: 1.4 10*3/uL — ABNORMAL HIGH (ref 0.1–1.0)
Monocytes Relative: 12 %
Neutro Abs: 8.5 10*3/uL — ABNORMAL HIGH (ref 1.7–7.7)
Neutrophils Relative %: 71 %
Platelets: 252 10*3/uL (ref 150–400)
RBC: 4.75 MIL/uL (ref 4.22–5.81)
RDW: 13.5 % (ref 11.5–15.5)
WBC: 11.8 10*3/uL — ABNORMAL HIGH (ref 4.0–10.5)
nRBC: 0 % (ref 0.0–0.2)

## 2019-07-10 MED ORDER — DEXAMETHASONE SODIUM PHOSPHATE 10 MG/ML IJ SOLN
INTRAMUSCULAR | Status: AC
  Administered 2019-07-10: 10 mg via INTRAVENOUS
  Filled 2019-07-10: qty 1

## 2019-07-10 MED ORDER — DEXAMETHASONE SODIUM PHOSPHATE 10 MG/ML IJ SOLN
10.0000 mg | Freq: Once | INTRAMUSCULAR | Status: AC
  Administered 2019-07-10: 02:00:00 10 mg via INTRAVENOUS

## 2019-07-10 MED ORDER — IOHEXOL 300 MG/ML  SOLN
100.0000 mL | Freq: Once | INTRAMUSCULAR | Status: AC | PRN
  Administered 2019-07-10: 100 mL via INTRAVENOUS

## 2019-07-10 MED ORDER — SODIUM CHLORIDE 0.9 % IV SOLN
3.0000 g | Freq: Once | INTRAVENOUS | Status: AC
  Administered 2019-07-10: 3 g via INTRAVENOUS
  Filled 2019-07-10: qty 8

## 2019-07-10 NOTE — ED Provider Notes (Signed)
[facility] Emergency Department Provider Note  ____________________________________________  Time seen: Approximately 1:00 AM  I have reviewed the triage vital signs and the nursing notes.   HISTORY  Chief Complaint Facial Swelling   HPI Logan Lucas is a 43 y.o. male who presents for evaluation of dental pain. This is patient's 3rd visit to the ED in 4 days for dental pain. Has been started on augmentin and is now complaining of worsening swelling under his tongue/ chin which started in the last 24 hours. No difficulty swallowing or breathing, no trismus, no fever.  Patient has not seen a dentist.  He is also complaining of constant throbbing pain located in the right lower molar region.  Past Medical History:  Diagnosis Date  . Asthma   . Hypertension     Patient Active Problem List   Diagnosis Date Noted  . Chest pain 06/17/2018    Past Surgical History:  Procedure Laterality Date  . none      Prior to Admission medications   Medication Sig Start Date End Date Taking? Authorizing Provider  amoxicillin (AMOXIL) 875 MG tablet Take 1 tablet (875 mg total) by mouth 2 (two) times daily. 07/06/19   Johnn Hai, PA-C  hydrochlorothiazide (HYDRODIURIL) 25 MG tablet Take 1 tablet (25 mg total) by mouth daily. 07/06/19   Johnn Hai, PA-C  lidocaine (XYLOCAINE) 2 % solution Use as directed 5 mLs in the mouth or throat every 6 (six) hours as needed for mouth pain. 07/08/19   Sable Feil, PA-C  methylPREDNISolone (MEDROL DOSEPAK) 4 MG TBPK tablet Take Tapered dose as directed 07/08/19   Sable Feil, PA-C  metoprolol tartrate (LOPRESSOR) 25 MG tablet Take 0.5 tablets (12.5 mg total) by mouth 2 (two) times daily. 07/06/19 09/04/19  Johnn Hai, PA-C  traMADol (ULTRAM) 50 MG tablet Take 1 tablet (50 mg total) by mouth every 6 (six) hours as needed. 07/06/19   Johnn Hai, PA-C    Allergies Patient has no known allergies.   Family History  Problem Relation Age of Onset  . Diabetes Mother   . Cancer Father     Social History Social History   Tobacco Use  . Smoking status: Current Every Day Smoker    Packs/day: 1.00    Years: 5.00    Pack years: 5.00    Types: Cigarettes  . Smokeless tobacco: Never Used  Substance Use Topics  . Alcohol use: Yes    Alcohol/week: 7.0 standard drinks    Types: 2 Cans of beer, 5 Shots of liquor per week  . Drug use: No    Frequency: 1.0 times per week    Review of Systems  Constitutional: Negative for fever. Eyes: Negative for visual changes. ENT: Negative for sore throat. + dental pain Neck: No neck pain. + neck swelling  Cardiovascular: Negative for chest pain. Respiratory: Negative for shortness of breath. Gastrointestinal: Negative for abdominal pain, vomiting or diarrhea. Genitourinary: Negative for dysuria. Musculoskeletal: Negative for back pain. Skin: Negative for rash. Neurological: Negative for headaches, weakness or numbness. Psych: No SI or HI  ____________________________________________   PHYSICAL EXAM:  VITAL SIGNS: ED Triage Vitals [07/09/19 2349]  Enc Vitals Group     BP (!) 152/92     Pulse Rate 100     Resp 18     Temp 99 F (37.2 C)     Temp Source Oral     SpO2 99 %  Weight      Height      Head Circumference      Peak Flow      Pain Score      Pain Loc      Pain Edu?      Excl. in [place]?     Constitutional: Alert and oriented. Well appearing and in no apparent distress. HEENT:      Head: Normocephalic and atraumatic.         Eyes: Conjunctivae are normal. Sclera is non-icteric.       Mouth/Throat: Mucous membranes are moist. Patient has several cavities and broken molars, floor of the mouth is indurated, no trismus, handling saliva with no difficulties, no muffled voice, airway is patent, no obvious abscess seen.      Neck: Supple with no signs of meningismus. No erythema, crepitus, or warmth Cardiovascular: Regular  rate and rhythm.  Respiratory: Normal respiratory effort.  Musculoskeletal: No edema, cyanosis, or erythema of extremities. Neurologic: Normal speech and language. Face is symmetric. Moving all extremities. No gross focal neurologic deficits are appreciated. Skin: Skin is warm, dry and intact. No rash noted. Psychiatric: Mood and affect are normal. Speech and behavior are normal.  ____________________________________________   LABS (all labs ordered are listed, but only abnormal results are displayed)  Labs Reviewed  CBC WITH DIFFERENTIAL/PLATELET - Abnormal; Notable for the following components:      Result Value   WBC 11.8 (*)    Neutro Abs 8.5 (*)    Monocytes Absolute 1.4 (*)    All other components within normal limits  BASIC METABOLIC PANEL - Abnormal; Notable for the following components:   Glucose, Bld 162 (*)    All other components within normal limits   ____________________________________________  EKG  none  ____________________________________________  RADIOLOGY  I have personally reviewed the images performed during this visit and I agree with the Radiologist's read.   Interpretation by Radiologist:  Ct Soft Tissue Neck W Contrast  Result Date: 07/10/2019 CLINICAL DATA:  43 year old male with neck pain, increasing dental pain and swelling under the chin. EXAM: CT NECK WITH CONTRAST TECHNIQUE: Multidetector CT imaging of the neck was performed using the standard protocol following the bolus administration of intravenous contrast. CONTRAST:  117mL OMNIPAQUE IOHEXOL 300 MG/ML  SOLN COMPARISON:  Neck radiographs 07/08/2019. FINDINGS: Pharynx and larynx: The glottis is closed. Otherwise negative larynx. Pharyngeal soft tissue contours are within normal limits. Negative parapharyngeal and retropharyngeal spaces. Salivary glands: There is an irregular rim enhancing abscess located within the bilateral mylohyoid muscles encompassing 16 x 29 x 17 millimeters (AP by  transverse by CC). See series 2, image 50. This soft tissue abscesses tracking from a smaller 12 millimeter subperiosteal abscess located along the medial body of the right mandible (series 2, image 39 and series 6, image 31). That in turn appears to be related to a carious posterior right mandible molar with periapical lucency. However, there is no overt mandible cortical breakthrough in this case. Regional cellulitis with soft tissue inflammation. Secondary inflammation of both submandibular spaces. The right submandibular duct also appears partially obstructed, with asymmetric right submandibular gland ductal ectasia and gland inflammation. The sublingual space itself is spared. Both parotid glands are within normal limits. No tracking soft tissue gas. Thyroid: Negative. Lymph nodes: Reactive level 1 and right level 2 lymph nodes. The largest nodes are 12 millimeter short axis. No cystic or necrotic nodes. Vascular: The major vascular structures in the neck and at the skull base  are patent. Limited intracranial: Negative. Visualized orbits: Minimally included. Mastoids and visualized paranasal sinuses: Clear. Skeleton: Carious posterior right mandible molar as described above. No other acute dental finding. No superimposed No acute osseous abnormality identified. Upper chest: Negative. 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 - associated 12 mm Subperiosteal Abscess along the medial body of the mandible. - larger tracking 16 x 29 x 17 mm Intramuscular Abscess occupying the mylohyoid muscles below the floor of mouth. - regional cellulitis with reactive lymphadenopathy. - obstruction and secondary inflammation of the right submandibular gland. Electronically Signed   By: Genevie Ann M.D.   On: 07/10/2019 02:11      ____________________________________________   PROCEDURES  Procedure(s) performed: None Procedures Critical Care performed:  Yes   CRITICAL CARE Performed by: Rudene Re  ?  Total critical care time: 35 min  Critical care time was exclusive of separately billable procedures and treating other patients.  Critical care was necessary to treat or prevent imminent or life-threatening deterioration.  Critical care was time spent personally by me on the following activities: development of treatment plan with patient and/or surrogate as well as nursing, discussions with consultants, evaluation of patient's response to treatment, examination of patient, obtaining history from patient or surrogate, ordering and performing treatments and interventions, ordering and review of laboratory studies, ordering and review of radiographic studies, pulse oximetry and re-evaluation of patient's condition.  ____________________________________________   INITIAL IMPRESSION / ASSESSMENT AND PLAN / ED COURSE   43 y.o. male who presents for evaluation of dental pain and new neck swelling x 24 hours. Patient has no obvious swelling on exam, floor of the mouth is indurated, handling his saliva, no respiratory distress, no muffled voice, airways patent, no obvious abscess seen, neck is soft with no crepitus, no erythema or swelling.  CT concerning for odontogenic deep space infection of the neck.  Airway remains patent.  Will start patient on Unasyn and Decadron.  Will consult [facility] for transfer  Clinical Course as of Jul 09 648  Fri Jul 10, 2019  0302 Spoke with Dr. Josefina Do, OMFS at [facility] who recommended sending patient to his office at [facility] for surgical management of the infection instead of transferring to [facility]. In the meantime, I will keep the patient in the ED for the next few hours to monitor his airway and as long as patient's clinical status remains unchanged, I will send patient to [facility] in the am.    [CV]  0504 Patient remains stable with no evidence of airway compromise. No trismus, handling saliva with no difficulty, no muffled  voice. Swelling improved after decadron.    [CV]    Clinical Course User Index [CV] Alfred Levins [place], MD    _________________________ 6:40 AM on 07/10/2019 -----------------------------------------  Patient re-evaluated again this morning with stable exam. Patient to go straight to Dr. Laural Golden office at [facility]. Explained that this infection will continue to grow if not surgically treated and may lead to airway compromise, significant morbidity including need for tracheostomy/ cric, Ludwig's angina, or even death and therefore patient must go straight to [facility] this morning. Patient is to remain NPO for surgical intervention. Patient understands these instructions and will go straight to [facility]. A CD was given to patient with a copy of his CT scan. Patient drove here and is able to drive to [facility]. Transportation is not an issue for the patient.     As part of my medical decision making,  I reviewed the following data within the [place] notes reviewed and incorporated, Labs reviewed , Old chart reviewed, Radiograph reviewed , A consult was requested and obtained from this/these consultant(s) [facility] OMFS, Notes from prior ED visits and [place] Controlled Substance Database   Patient was evaluated in Emergency Department today for the symptoms described in the history of present illness. Patient was evaluated in the context of the global COVID-19 pandemic, which necessitated consideration that the patient might be at risk for infection with the SARS-CoV-2 virus that causes COVID-19. Institutional protocols and algorithms that pertain to the evaluation of patients at risk for COVID-19 are in a state of rapid change based on information released by regulatory bodies including the CDC and federal and state organizations. These policies and algorithms were followed during the patient's care in the ED.   ____________________________________________   FINAL CLINICAL IMPRESSION(S) / ED  DIAGNOSES   Final diagnoses:  Subperiosteal abscess of jaw  Abscess of multiple sites of head and neck      NEW MEDICATIONS STARTED DURING THIS VISIT:  ED Discharge Orders    None       Note:  This document was prepared using Dragon voice recognition software and may include unintentional dictation errors.    Alfred Levins, [place], MD 07/10/19 [phone]

## 2019-07-10 NOTE — Discharge Instructions (Addendum)
Do not eat or drink anything as you will need surgery this morning to drain the abscesses in your neck. Drive straight to [address] [place] for your 8AM appointment with Dr. Anselm Jungling. The phone number of the clinic is [phone]

## 2019-07-10 NOTE — ED Notes (Signed)
Patient transported to CT 

## 2019-07-10 NOTE — ED Notes (Signed)
Patient educated on importance of going to appointment at [facility]. Patient states he is going. Discharge Paperwork and CD with info given to patient to take with him to [facility].

## 2019-07-12 ENCOUNTER — Other Ambulatory Visit: Payer: Self-pay

## 2019-07-12 ENCOUNTER — Emergency Department: Payer: Self-pay

## 2019-07-12 ENCOUNTER — Emergency Department
Admission: EM | Admit: 2019-07-12 | Discharge: 2019-07-12 | Disposition: A | Discharge status: Other Acute Inpatient Hospital | Payer: Self-pay | Attending: Emergency Medicine | Admitting: Emergency Medicine

## 2019-07-12 ENCOUNTER — Encounter: Payer: Self-pay | Admitting: Radiology

## 2019-07-12 DIAGNOSIS — Z79899 Other long term (current) drug therapy: Secondary | ICD-10-CM | POA: Insufficient documentation

## 2019-07-12 DIAGNOSIS — I10 Essential (primary) hypertension: Secondary | ICD-10-CM | POA: Insufficient documentation

## 2019-07-12 DIAGNOSIS — K122 Cellulitis and abscess of mouth: Secondary | ICD-10-CM | POA: Insufficient documentation

## 2019-07-12 DIAGNOSIS — Z20828 Contact with and (suspected) exposure to other viral communicable diseases: Secondary | ICD-10-CM | POA: Insufficient documentation

## 2019-07-12 DIAGNOSIS — J45909 Unspecified asthma, uncomplicated: Secondary | ICD-10-CM | POA: Insufficient documentation

## 2019-07-12 DIAGNOSIS — F17210 Nicotine dependence, cigarettes, uncomplicated: Secondary | ICD-10-CM | POA: Insufficient documentation

## 2019-07-12 LAB — CBC WITH DIFFERENTIAL/PLATELET
Abs Immature Granulocytes: 0.15 10*3/uL — ABNORMAL HIGH (ref 0.00–0.07)
Basophils Absolute: 0 10*3/uL (ref 0.0–0.1)
Basophils Relative: 0 %
Eosinophils Absolute: 0.1 10*3/uL (ref 0.0–0.5)
Eosinophils Relative: 1 %
HCT: 41.9 % (ref 39.0–52.0)
Hemoglobin: 13.8 g/dL (ref 13.0–17.0)
Immature Granulocytes: 1 %
Lymphocytes Relative: 13 %
Lymphs Abs: 1.9 10*3/uL (ref 0.7–4.0)
MCH: 29.9 pg (ref 26.0–34.0)
MCHC: 32.9 g/dL (ref 30.0–36.0)
MCV: 90.9 fL (ref 80.0–100.0)
Monocytes Absolute: 1.7 10*3/uL — ABNORMAL HIGH (ref 0.1–1.0)
Monocytes Relative: 12 %
Neutro Abs: 10.5 10*3/uL — ABNORMAL HIGH (ref 1.7–7.7)
Neutrophils Relative %: 73 %
Platelets: 265 10*3/uL (ref 150–400)
RBC: 4.61 MIL/uL (ref 4.22–5.81)
RDW: 13.3 % (ref 11.5–15.5)
WBC: 14.3 10*3/uL — ABNORMAL HIGH (ref 4.0–10.5)
nRBC: 0 % (ref 0.0–0.2)

## 2019-07-12 LAB — BASIC METABOLIC PANEL
Anion gap: 10 (ref 5–15)
BUN: 11 mg/dL (ref 6–20)
CO2: 24 mmol/L (ref 22–32)
Calcium: 9.2 mg/dL (ref 8.9–10.3)
Chloride: 103 mmol/L (ref 98–111)
Creatinine, Ser: 0.97 mg/dL (ref 0.61–1.24)
GFR calc Af Amer: >60 mL/min (ref >60)
GFR calc non Af Amer: >60 mL/min (ref >60)
Glucose, Bld: 187 mg/dL — ABNORMAL HIGH (ref 70–99)
Potassium: 3.8 mmol/L (ref 3.5–5.1)
Sodium: 137 mmol/L (ref 135–145)

## 2019-07-12 LAB — SARS CORONAVIRUS 2 BY RT PCR (HOSPITAL ORDER, PERFORMED IN ~~LOC~~ HOSPITAL LAB): SARS Coronavirus 2: NEGATIVE

## 2019-07-12 LAB — PROCALCITONIN: Procalcitonin: 3.5 ng/mL

## 2019-07-12 LAB — LACTIC ACID, PLASMA: Lactic Acid, Venous: 1.1 mmol/L (ref 0.5–1.9)

## 2019-07-12 MED ORDER — ONDANSETRON HCL 4 MG/2ML IJ SOLN
4.0000 mg | Freq: Once | INTRAMUSCULAR | Status: AC
  Administered 2019-07-12: 4 mg via INTRAVENOUS
  Filled 2019-07-12: qty 2

## 2019-07-12 MED ORDER — LIDOCAINE HCL (PF) 2 % IJ SOLN
10.0000 mL | Freq: Once | INTRAMUSCULAR | Status: DC
  Filled 2019-07-12: qty 10

## 2019-07-12 MED ORDER — DEXAMETHASONE SODIUM PHOSPHATE 10 MG/ML IJ SOLN
10.0000 mg | Freq: Once | INTRAMUSCULAR | Status: AC
  Administered 2019-07-12: 10 mg via INTRAVENOUS
  Filled 2019-07-12: qty 1

## 2019-07-12 MED ORDER — MORPHINE SULFATE (PF) 4 MG/ML IV SOLN
4.0000 mg | Freq: Once | INTRAVENOUS | Status: AC
  Administered 2019-07-12: 4 mg via INTRAVENOUS
  Filled 2019-07-12: qty 1

## 2019-07-12 MED ORDER — SODIUM CHLORIDE 0.9 % IV SOLN
3.0000 g | Freq: Once | INTRAVENOUS | Status: AC
  Administered 2019-07-12: 09:00:00 3 g via INTRAVENOUS
  Filled 2019-07-12: qty 8

## 2019-07-12 MED ORDER — CLINDAMYCIN PHOSPHATE 600 MG/50ML IV SOLN
600.0000 mg | Freq: Once | INTRAVENOUS | Status: AC
  Administered 2019-07-12: 600 mg via INTRAVENOUS
  Filled 2019-07-12: qty 50

## 2019-07-12 MED ORDER — IOHEXOL 300 MG/ML  SOLN
75.0000 mL | Freq: Once | INTRAMUSCULAR | Status: AC | PRN
  Administered 2019-07-12: 75 mL via INTRAVENOUS

## 2019-07-12 MED ORDER — LIDOCAINE HCL (CARDIAC) PF 100 MG/5ML IV SOSY
PREFILLED_SYRINGE | INTRAVENOUS | Status: AC
  Administered 2019-07-12: 09:00:00 200 mg
  Filled 2019-07-12: qty 10

## 2019-07-12 NOTE — ED Notes (Addendum)
Signature pad not working. Hard copy of transfer consent printed and signed by patient. Copy faxed to [facility] by ED secretary.

## 2019-07-12 NOTE — ED Triage Notes (Signed)
Patient reports swelling to lower jaw for over a week.  Patient reports had tooth pull on Friday.  Reports swelling is getting worse again.

## 2019-07-12 NOTE — ED Provider Notes (Signed)
[facility] Emergency Department Provider Note  ____________________________________________   First MD Initiated Contact with Patient 07/12/19 [phone]     (approximate)  I have reviewed the triage vital signs and the nursing notes.   HISTORY  Chief Complaint Facial Swelling    HPI Logan Lucas is a 43 y.o. male with a history of poor dentition and who was seen in this ED about 2 days ago and sent to [facility] for OMFS follow-up for developing neck infection with odontogenic source.  He presents tonight for worsening swelling and pain  in his mouth and neck.  He says that he feels like it is not gotten better since Friday when he had several teeth pulled and a drain was placed in the right lower jaw by Dr. Toma Deiters at [facility] after being evaluated in this emergency department and referred directly to Dr. Anselm Jungling.  He has been taking Augmentin but was not prescribed any steroids.  He says that the pain and swelling is getting worse.  He is starting to feel like it is sometimes difficult to swallow.  His voice is a little bit hoarse.  The swelling is primarily underneath his chin and on both sides of his jaw.  He has not had any nausea or vomiting.  He has had no known contact with COVID-19 patients.  He denies chest pain, shortness of breath, cough, nausea, vomiting, and abdominal pain.  Nothing in particular makes his symptoms better or worse and he reports that the pain is severe.        Past Medical History:  Diagnosis Date   Asthma    Hypertension     Patient Active Problem List   Diagnosis Date Noted   Chest pain 06/17/2018    Past Surgical History:  Procedure Laterality Date   none      Prior to Admission medications   Medication Sig Start Date End Date Taking? Authorizing Provider  amoxicillin (AMOXIL) 875 MG tablet Take 1 tablet (875 mg total) by mouth 2 (two) times daily. 07/06/19   Johnn Hai, PA-C  hydrochlorothiazide  (HYDRODIURIL) 25 MG tablet Take 1 tablet (25 mg total) by mouth daily. 07/06/19   Johnn Hai, PA-C  lidocaine (XYLOCAINE) 2 % solution Use as directed 5 mLs in the mouth or throat every 6 (six) hours as needed for mouth pain. 07/08/19   Sable Feil, PA-C  methylPREDNISolone (MEDROL DOSEPAK) 4 MG TBPK tablet Take Tapered dose as directed 07/08/19   Sable Feil, PA-C  metoprolol tartrate (LOPRESSOR) 25 MG tablet Take 0.5 tablets (12.5 mg total) by mouth 2 (two) times daily. 07/06/19 09/04/19  Johnn Hai, PA-C  traMADol (ULTRAM) 50 MG tablet Take 1 tablet (50 mg total) by mouth every 6 (six) hours as needed. 07/06/19   Johnn Hai, PA-C    Allergies Patient has no known allergies.  Family History  Problem Relation Age of Onset   Diabetes Mother    Cancer Father     Social History Social History   Tobacco Use   Smoking status: Current Every Day Smoker    Packs/day: 1.00    Years: 5.00    Pack years: 5.00    Types: Cigarettes   Smokeless tobacco: Never Used  Substance Use Topics   Alcohol use: Yes    Alcohol/week: 7.0 standard drinks    Types: 2 Cans of beer, 5 Shots of liquor per week   Drug use: No    Frequency:  1.0 times per week    Review of Systems Constitutional: No fever/chills Eyes: No visual changes. ENT: Worsening pain and swelling in his lower jaw and below his chin and throat. Cardiovascular: Denies chest pain. Respiratory: Denies shortness of breath. Gastrointestinal: No abdominal pain.  No nausea, no vomiting.  No diarrhea.  No constipation. Genitourinary: Negative for dysuria. Musculoskeletal: Negative for neck pain.  Negative for back pain. Integumentary: Negative for rash. Neurological: Negative for headaches, focal weakness or numbness.   ____________________________________________   PHYSICAL EXAM:  VITAL SIGNS: ED Triage Vitals  Enc Vitals Group     BP 07/12/19 0552 (!) 140/104     Pulse Rate 07/12/19 0552 92     Resp  07/12/19 0552 18     Temp 07/12/19 0552 97.8 F (36.6 C)     Temp Source 07/12/19 0552 Oral     SpO2 07/12/19 0552 98 %     Weight 07/12/19 0544 117.9 kg (260 lb)     Height 07/12/19 0544 1.753 m (5\' 9" )     Head Circumference --      Peak Flow --      Pain Score 07/12/19 0546 10     Pain Loc --      Pain Edu? --      Excl. in [place]? --     Constitutional: Alert and oriented.  Appears uncomfortable. Eyes: Conjunctivae are normal.  Head: Atraumatic. Nose: No congestion/rhinnorhea. Mouth/Throat: The patient has mild trismus.  He has edema of the upper midline of the neck and the floor the mouth below the tongue with brawny induration below the tongue.  He also has brawny induration below both mandibles but seems to be more noticeable on the left as well as midline swelling below the chin.  He has no stridor and is not drooling; he is tolerating his secretions well.  He has very mild dysphonia.  No tongue protrusion.  He is reclined at approximately a 30 degree angle without any airway difficulties. Neck: No stridor.  No meningeal signs.   Cardiovascular: Normal rate, regular rhythm. Good peripheral circulation. Grossly normal heart sounds. Respiratory: Normal respiratory effort.  No retractions. Gastrointestinal: Soft and nontender. No distention.  Musculoskeletal: No lower extremity tenderness nor edema. No gross deformities of extremities. Neurologic:  Normal speech and language. No gross focal neurologic deficits are appreciated.  Skin:  Skin is warm, dry and intact. Psychiatric: Mood and affect are normal. Speech and behavior are normal.  ____________________________________________   LABS (all labs ordered are listed, but only abnormal results are displayed)  Labs Reviewed  CBC WITH DIFFERENTIAL/PLATELET - Abnormal; Notable for the following components:      Result Value   WBC 14.3 (*)    Neutro Abs 10.5 (*)    Monocytes Absolute 1.7 (*)    Abs Immature Granulocytes 0.15 (*)      All other components within normal limits  BASIC METABOLIC PANEL - Abnormal; Notable for the following components:   Glucose, Bld 187 (*)    All other components within normal limits  SARS CORONAVIRUS 2 (HOSPITAL ORDER, [place] [facility])  LACTIC ACID, PLASMA  LACTIC ACID, PLASMA  PROCALCITONIN   ____________________________________________  EKG  No indication for EKG ____________________________________________  RADIOLOGY I, Hinda Kehr, personally viewed and evaluated these images (plain radiographs) as part of my medical decision making, as well as reviewing the written report by the radiologist.  ED MD interpretation:  CT neck pending at time of signout  Official radiology report(s): No results found.  ____________________________________________   PROCEDURES   Procedure(s) performed (including Critical Care):  .Critical Care Performed by: Hinda Kehr, MD Authorized by: Hinda Kehr, MD   Critical care provider statement:    Critical care time (minutes):  30   Critical care time was exclusive of:  Separately billable procedures and treating other patients   Critical care was necessary to treat or prevent imminent or life-threatening deterioration of the following conditions: Ludwig's angina - potential airway compromise.   Critical care was time spent personally by me on the following activities:  Development of treatment plan with patient or surrogate, discussions with consultants, evaluation of patient's response to treatment, examination of patient, obtaining history from patient or surrogate, ordering and performing treatments and interventions, ordering and review of laboratory studies, ordering and review of radiographic studies, pulse oximetry, re-evaluation of patient's condition and review of old charts     ____________________________________________   [place] / MDM / [place] / ED COURSE  As part of my  medical decision making, I reviewed the following data within the [place] notes reviewed and incorporated, Labs reviewed , Old chart reviewed, Patient signed out to Dr. Joni Fears and reviewed Notes from prior ED visits.   Differential diagnosis includes, but is not limited to, Ludwig's angina, other deep neck infection, epiglottitis, facial abscess or cellulitis.  Based on the medical record, the patient's condition has worsened substantially over the last 24 hours.  However the patient says it has been a gradual worsening.  Although he has some mild trismus, he has no protruding tongue, no drooling, no stridor, and he is tolerating reclining at about a 30 degree angle.  I do not feel that he needs emergent airway intervention at this time but he does need emergent medications.  We are providing clindamycin 600 mg IV given that he has been on Augmentin and still seems to be worsening.  I am giving Decadron 10 mg IV and asked him to remain n.p.o.  He is getting morphine 4 mg IV and Zofran 4 mg IV.  I have ordered 1 L normal saline IV for his very mild tachycardia.  I feel strongly that he will require OMFS evaluation and treatment which is not available at this hospital but given his stability at this time, his recent CT scan, and his recent outpatient visit to OMFS, I feel it is appropriate to begin treatment and obtain imaging for the purpose of comparison and then he will better be able to be directed to the emergency department at [facility] versus directly to the OMFS service.  The patient understands and agrees with the plan.  At the time of signout to Dr. Joni Fears, the CT scan is pending.  Normal basic metabolic panel, mild leukocytosis of 14.3 which is up from previous, procalcitonin is pending.  Normal lactic acid.  I have ordered a rapid coronavirus swab but this should not hold up transfer as he will need further evaluation and treatment regardless of coronavirus status.  However he  has no signs or symptoms or exposures that are concerning.    Clinical Course as of Jul 11 721  Sun Jul 12, 2019  0643 WBC(!): 14.3 [CF]    Clinical Course User Index [CF] Hinda Kehr, MD     ____________________________________________  FINAL CLINICAL IMPRESSION(S) / ED DIAGNOSES  Final diagnoses:  Ludwig's angina     MEDICATIONS GIVEN DURING THIS VISIT:  Medications  dexamethasone (DECADRON) injection  10 mg (10 mg Intravenous Given 07/12/19 2671)  clindamycin (CLEOCIN) IVPB 600 mg (600 mg Intravenous New Bag/Given 07/12/19 2458)  morphine 4 MG/ML injection 4 mg (4 mg Intravenous Given 07/12/19 0652)  ondansetron (ZOFRAN) injection 4 mg (4 mg Intravenous Given 07/12/19 0652)  iohexol (OMNIPAQUE) 300 MG/ML solution 75 mL (75 mLs Intravenous Contrast Given 07/12/19 0700)     ED Discharge Orders    None      *Please note:  Logan Lucas was evaluated in Emergency Department on 07/12/2019 for the symptoms described in the history of present illness. He was evaluated in the context of the global COVID-19 pandemic, which necessitated consideration that the patient might be at risk for infection with the SARS-CoV-2 virus that causes COVID-19. Institutional protocols and algorithms that pertain to the evaluation of patients at risk for COVID-19 are in a state of rapid change based on information released by regulatory bodies including the CDC and federal and state organizations. These policies and algorithms were followed during the patient's care in the ED.  Some ED evaluations and interventions may be delayed as a result of limited staffing during the pandemic.*  Note:  This document was prepared using Dragon voice recognition software and may include unintentional dictation errors.   Hinda Kehr, MD 07/12/19 [phone]

## 2019-07-12 NOTE — ED Provider Notes (Signed)
.  Critical Care Performed by: Carrie Mew, MD Authorized by: Carrie Mew, MD   Critical care provider statement:    Critical care time (minutes):  35   Critical care time was exclusive of:  Separately billable procedures and treating other patients   Critical care was necessary to treat or prevent imminent or life-threatening deterioration of the following conditions:  Respiratory failure   Critical care was time spent personally by me on the following activities:  Development of treatment plan with patient or surrogate, discussions with consultants, evaluation of patient's response to treatment, examination of patient, obtaining history from patient or surrogate, ordering and performing treatments and interventions, ordering and review of laboratory studies, ordering and review of radiographic studies, pulse oximetry, re-evaluation of patient's condition and review of old charts    Clinical Course as of Jul 11 1014  Sun Jul 12, 2019  0643 WBC(!): 14.3 [CF]  0730 Case discussed with radiology Dr. Pascal Lux who confirms expanding area of cellulitis and inflammation with a small area of supraglottic edema which is described secondary to be active infection in the submental space.  Airway appears patent.   [PS]  [phone] Patient updated, agrees with transfer to [facility] for more definitive management.  Called patient logistic center for [facility] who anticipate ED to ED transfer after initial discussion with OMFS Dr. Patrina Levering, but waiting for further discussion between Dr. Patrina Levering, Dr. Anselm Jungling, and ED physician.   [PS]  [phone] SARS Coronavirus 2: NEGATIVE [PS]  3235 C/w bacterial SSTI  Procalcitonin: 3.50 [PS]  0852 Pt accepted to [facility] ED by Dr. Corlis Hove. [facility] to transport   [PS]  5732 Patient reports he feels about the same as this morning, no worsening, no increased shortness of breath difficulty swallowing or breathing.  Managing secretions just fine, comfortable appearing while lying semirecumbent.   No stridor retractions or tachypnea.   [PS]  1014 No worsening of symptoms, still breathing comfortably and able to lie back without distress.  Managing secretions.  Emergent airway intervention or airway protection for transport not indicated at this time.   [PS]    Clinical Course User Index [CF] Hinda Kehr, MD [PS] Carrie Mew, MD    ----------------------------------------- 10:15 AM on 07/12/2019 ----------------------------------------- [facility] transport arrived to take patient.  Final diagnoses:  Ludwig's angina       Carrie Mew, MD 07/12/19 1015

## 2019-07-12 NOTE — ED Notes (Signed)
Pt transported to [facility] ED by [facility]. Pt understands need for transfer. Pt NAD at this time.

## 2019-08-21 ENCOUNTER — Emergency Department
Admission: EM | Admit: 2019-08-21 | Discharge: 2019-08-21 | Disposition: A | Discharge status: Home / Self Care | Payer: Self-pay | Attending: Emergency Medicine | Admitting: Emergency Medicine

## 2019-08-21 ENCOUNTER — Other Ambulatory Visit: Payer: Self-pay

## 2019-08-21 ENCOUNTER — Emergency Department: Payer: Self-pay

## 2019-08-21 DIAGNOSIS — M545 Low back pain, unspecified: Secondary | ICD-10-CM

## 2019-08-21 DIAGNOSIS — J45909 Unspecified asthma, uncomplicated: Secondary | ICD-10-CM | POA: Insufficient documentation

## 2019-08-21 DIAGNOSIS — F17210 Nicotine dependence, cigarettes, uncomplicated: Secondary | ICD-10-CM | POA: Insufficient documentation

## 2019-08-21 DIAGNOSIS — I10 Essential (primary) hypertension: Secondary | ICD-10-CM | POA: Insufficient documentation

## 2019-08-21 DIAGNOSIS — Z113 Encounter for screening for infections with a predominantly sexual mode of transmission: Secondary | ICD-10-CM | POA: Insufficient documentation

## 2019-08-21 DIAGNOSIS — Z79899 Other long term (current) drug therapy: Secondary | ICD-10-CM | POA: Insufficient documentation

## 2019-08-21 LAB — URINALYSIS, COMPLETE (UACMP) WITH MICROSCOPIC
Bacteria, UA: NONE SEEN
Bilirubin Urine: NEGATIVE
Glucose, UA: NEGATIVE mg/dL
Hgb urine dipstick: NEGATIVE
Ketones, ur: NEGATIVE mg/dL
Nitrite: NEGATIVE
Protein, ur: NEGATIVE mg/dL
Specific Gravity, Urine: 1.011 (ref 1.005–1.030)
Squamous Epithelial / HPF: NONE SEEN (ref 0–5)
pH: 6 (ref 5.0–8.0)

## 2019-08-21 MED ORDER — CYCLOBENZAPRINE HCL 5 MG PO TABS: ORAL_TABLET | ORAL | 0 refills | Status: DC

## 2019-08-21 MED ORDER — METRONIDAZOLE 500 MG PO TABS
2000.0000 mg | ORAL_TABLET | Freq: Once | ORAL | Status: AC
  Administered 2019-08-21: 2000 mg via ORAL

## 2019-08-21 MED ORDER — CEFTRIAXONE SODIUM 250 MG IJ SOLR
250.0000 mg | Freq: Once | INTRAMUSCULAR | Status: AC
  Administered 2019-08-21: 11:00:00 250 mg via INTRAMUSCULAR
  Filled 2019-08-21: qty 250

## 2019-08-21 MED ORDER — IBUPROFEN 600 MG PO TABS: 600.0000 mg | ORAL_TABLET | Freq: Four times a day (QID) | ORAL | 0 refills | Status: DC | PRN

## 2019-08-21 MED ORDER — KETOROLAC TROMETHAMINE 30 MG/ML IJ SOLN
30.0000 mg | Freq: Once | INTRAMUSCULAR | Status: AC
  Administered 2019-08-21: 11:00:00 30 mg via INTRAMUSCULAR
  Filled 2019-08-21: qty 1

## 2019-08-21 MED ORDER — AZITHROMYCIN 500 MG PO TABS
1000.0000 mg | ORAL_TABLET | Freq: Once | ORAL | Status: AC
  Administered 2019-08-21: 1000 mg via ORAL
  Filled 2019-08-21: qty 2

## 2019-08-21 NOTE — ED Triage Notes (Signed)
Pt c/o lower back pain for the past 2-3 days, denies injury.

## 2019-08-21 NOTE — Discharge Instructions (Signed)
You were treated for STDs in the emergency department.  Your test will be available in a couple of days.  Please follow-up with the health department for recheck.  Your back x-ray is negative.  You can take Flexeril to help relax your muscles and ibuprofen for pain and inflammation. Follow up with primary care.

## 2019-08-21 NOTE — ED Notes (Signed)
See triage note  States he is having some lower back discomfort  But is mainly here for STD  States his girlfriend  Was treated for bacterial vaginosis  And he "just doesn't feel right"

## 2019-08-21 NOTE — ED Provider Notes (Signed)
[facility] Emergency Department Provider Note  ____________________________________________  Time seen: Approximately 10:27 AM  I have reviewed the triage vital signs and the nursing notes.   HISTORY  Chief Complaint Back Pain    HPI Logan Lucas is a 43 y.o. male that presents emergency department for evaluation of low back pain and concern for an STD.  Patient states that his girlfriend was treated for a "bacterial" infection and they have not been able to have intercourse since.  He states that they have not discussed what she was treated for, and he is unsure if she was treated for and STD.  He is concerned that she was treated for an STD and would like to be treated as well.  Low back has been bothering him for a couple of days.  No trauma.  No bowel or bladder dysfunction or saddle anesthesias.  Pain does not radiate into his legs and stays in his low back.  No vomiting, abdominal pain, urinary symptoms, penile discharge.   Past Medical History:  Diagnosis Date  . Asthma   . Hypertension     Patient Active Problem List   Diagnosis Date Noted  . Chest pain 06/17/2018    Past Surgical History:  Procedure Laterality Date  . none      Prior to Admission medications   Medication Sig Start Date End Date Taking? Authorizing Provider  hydrochlorothiazide (HYDRODIURIL) 25 MG tablet Take 1 tablet (25 mg total) by mouth daily. 07/06/19  Yes Letitia Neri L, PA-C  metoprolol tartrate (LOPRESSOR) 25 MG tablet Take 0.5 tablets (12.5 mg total) by mouth 2 (two) times daily. 07/06/19 09/04/19 Yes Johnn Hai, PA-C  cyclobenzaprine (FLEXERIL) 5 MG tablet Take 1-2 tablets 3 times daily as needed 08/21/19   Laban Emperor, PA-C  ibuprofen (ADVIL) 600 MG tablet Take 1 tablet (600 mg total) by mouth every 6 (six) hours as needed. 08/21/19   Laban Emperor, PA-C    Allergies Patient has no known allergies.  Family History  Problem Relation Age of Onset   . Diabetes Mother   . Cancer Father     Social History Social History   Tobacco Use  . Smoking status: Current Every Day Smoker    Packs/day: 1.00    Years: 5.00    Pack years: 5.00    Types: Cigarettes  . Smokeless tobacco: Never Used  Substance Use Topics  . Alcohol use: Yes    Alcohol/week: 7.0 standard drinks    Types: 2 Cans of beer, 5 Shots of liquor per week  . Drug use: No    Frequency: 1.0 times per week     Review of Systems  Constitutional: No fever/chills Respiratory: No SOB. Gastrointestinal: No abdominal pain.  No nausea, no vomiting.  Genitourinary: Negative for dysuria. Musculoskeletal: Positive for back pain.  Skin: Negative for rash, abrasions, lacerations, ecchymosis. Neurological: Negative for numbness or tingling   ____________________________________________   PHYSICAL EXAM:  VITAL SIGNS: ED Triage Vitals  Enc Vitals Group     BP 08/21/19 0954 137/89     Pulse Rate 08/21/19 0954 82     Resp 08/21/19 0954 18     Temp 08/21/19 0954 98.3 F (36.8 C)     Temp Source 08/21/19 0954 Oral     SpO2 08/21/19 0954 100 %     Weight 08/21/19 0951 269 lb (122 kg)     Height 08/21/19 0951 5\' 9"  (1.753 m)     Head Circumference --  Peak Flow --      Pain Score 08/21/19 0951 8     Pain Loc --      Pain Edu? --      Excl. in [place]? --      Constitutional: Alert and oriented. Well appearing and in no acute distress. Eyes: Conjunctivae are normal. PERRL. EOMI. Head: Atraumatic. ENT:      Ears:      Nose: No congestion/rhinnorhea.      Mouth/Throat: Mucous membranes are moist.  Neck: No stridor.   Cardiovascular: Normal rate, regular rhythm.  Good peripheral circulation. Respiratory: Normal respiratory effort without tachypnea or retractions. Lungs CTAB. Good air entry to the bases with no decreased or absent breath sounds. Gastrointestinal: Bowel sounds 4 quadrants. Soft and nontender to palpation. No guarding or rigidity. No palpable masses.  No distention. No CVA tenderness. Musculoskeletal: Full range of motion to all extremities. No gross deformities appreciated.  Mild tenderness to palpation to lumbar spine and lumbar paraspinal muscles.  Full range of motion of lumbar spine.  Normal gait.  Strength equal in lower extremities bilaterally. Neurologic:  Normal speech and language. No gross focal neurologic deficits are appreciated.  Skin:  Skin is warm, dry and intact. No rash noted. Psychiatric: Mood and affect are normal. Speech and behavior are normal. Patient exhibits appropriate insight and judgement.   ____________________________________________   LABS (all labs ordered are listed, but only abnormal results are displayed)  Labs Reviewed  URINALYSIS, COMPLETE (UACMP) WITH MICROSCOPIC - Abnormal; Notable for the following components:      Result Value   Color, Urine STRAW (*)    APPearance CLEAR (*)    Leukocytes,Ua SMALL (*)    All other components within normal limits  GC/CHLAMYDIA PROBE AMP   ____________________________________________  EKG   ____________________________________________  RADIOLOGY Robinette Haines, personally viewed and evaluated these images (plain radiographs) as part of my medical decision making, as well as reviewing the written report by the radiologist.  Dg Lumbar Spine 2-3 Views  Result Date: 08/21/2019 CLINICAL DATA:  Low back pain for 3 days EXAM: LUMBAR SPINE - 2-3 VIEW COMPARISON:  None. FINDINGS: Frontal, lateral, and spot lumbosacral lateral images were obtained. There are 5 non-rib-bearing lumbar type vertebral bodies. There is no fracture or spondylolisthesis. The disc spaces appear. No erosive change. IMPRESSION: No fracture or spondylolisthesis.  No appreciable arthropathy. Electronically Signed   By: Lowella Grip III M.D.   On: 08/21/2019 10:53    ____________________________________________    PROCEDURES  Procedure(s) performed:     Procedures    Medications  cefTRIAXone (ROCEPHIN) injection 250 mg (250 mg Intramuscular Given 08/21/19 1041)  azithromycin (ZITHROMAX) tablet 1,000 mg (1,000 mg Oral Given 08/21/19 1040)  metroNIDAZOLE (FLAGYL) tablet 2,000 mg (2,000 mg Oral Given 08/21/19 1041)  ketorolac (TORADOL) 30 MG/ML injection 30 mg (30 mg Intramuscular Given 08/21/19 1041)     ____________________________________________   INITIAL IMPRESSION / ASSESSMENT AND PLAN / ED COURSE  Pertinent labs & imaging results that were available during my care of the patient were reviewed by me and considered in my medical decision making (see chart for details).  Review of the [place] CSRS was performed in accordance of the [place] prior to dispensing any controlled drugs.     Patient's diagnosis is consistent with STD screening and low back pain.  Vital signs and exam are reassuring.  Patient would not like to wait for STD results and would like to be treated empirically.  Urinalysis reveals  leukocytes and white blood cells.  Patient was given IM ceftriaxone, oral azithromycin, and oral flagyl. Lumbar x-ray negative for acute abnormalities.  Patient was given IM Toradol for pain.  Patient will be discharged home with prescriptions for flexeril and motrin. Patient is to follow up with PCP and health department as directed. Patient is given ED precautions to return to the ED for any worsening or new symptoms.  Logan Lucas was evaluated in Emergency Department on 08/21/2019 for the symptoms described in the history of present illness. He was evaluated in the context of the global COVID-19 pandemic, which necessitated consideration that the patient might be at risk for infection with the SARS-CoV-2 virus that causes COVID-19. Institutional protocols and algorithms that pertain to the evaluation of patients at risk for COVID-19 are in a state of rapid change based on information released by regulatory bodies including the CDC and  federal and state organizations. These policies and algorithms were followed during the patient's care in the ED.     ____________________________________________  FINAL CLINICAL IMPRESSION(S) / ED DIAGNOSES  Final diagnoses:  Acute bilateral low back pain without sciatica  Screening for STD (sexually transmitted disease)      NEW MEDICATIONS STARTED DURING THIS VISIT:  ED Discharge Orders         Ordered    cyclobenzaprine (FLEXERIL) 5 MG tablet     08/21/19 1101    ibuprofen (ADVIL) 600 MG tablet  Every 6 hours PRN     08/21/19 1101              This chart was dictated using voice recognition software/Dragon. Despite best efforts to proofread, errors can occur which can change the meaning. Any change was purely unintentional.    Laban Emperor, PA-C 08/21/19 1552    Earleen Newport, MD 08/22/19 [phone]

## 2019-08-25 LAB — GC/CHLAMYDIA PROBE AMP
Chlamydia trachomatis, NAA: NEGATIVE
Neisseria Gonorrhoeae by PCR: NEGATIVE

## 2019-10-10 ENCOUNTER — Other Ambulatory Visit: Payer: Self-pay

## 2019-10-10 ENCOUNTER — Emergency Department
Admission: EM | Admit: 2019-10-10 | Discharge: 2019-10-10 | Disposition: A | Discharge status: Home / Self Care | Payer: Self-pay | Attending: Emergency Medicine | Admitting: Emergency Medicine

## 2019-10-10 DIAGNOSIS — Z20822 Contact with and (suspected) exposure to covid-19: Secondary | ICD-10-CM

## 2019-10-10 DIAGNOSIS — R42 Dizziness and giddiness: Secondary | ICD-10-CM | POA: Insufficient documentation

## 2019-10-10 DIAGNOSIS — J029 Acute pharyngitis, unspecified: Secondary | ICD-10-CM | POA: Insufficient documentation

## 2019-10-10 DIAGNOSIS — J45909 Unspecified asthma, uncomplicated: Secondary | ICD-10-CM | POA: Insufficient documentation

## 2019-10-10 DIAGNOSIS — R112 Nausea with vomiting, unspecified: Secondary | ICD-10-CM | POA: Insufficient documentation

## 2019-10-10 DIAGNOSIS — R6883 Chills (without fever): Secondary | ICD-10-CM | POA: Insufficient documentation

## 2019-10-10 DIAGNOSIS — Z20828 Contact with and (suspected) exposure to other viral communicable diseases: Secondary | ICD-10-CM | POA: Insufficient documentation

## 2019-10-10 DIAGNOSIS — I10 Essential (primary) hypertension: Secondary | ICD-10-CM | POA: Insufficient documentation

## 2019-10-10 DIAGNOSIS — F17210 Nicotine dependence, cigarettes, uncomplicated: Secondary | ICD-10-CM | POA: Insufficient documentation

## 2019-10-10 DIAGNOSIS — Z79899 Other long term (current) drug therapy: Secondary | ICD-10-CM | POA: Insufficient documentation

## 2019-10-10 DIAGNOSIS — R630 Anorexia: Secondary | ICD-10-CM | POA: Insufficient documentation

## 2019-10-10 DIAGNOSIS — R438 Other disturbances of smell and taste: Secondary | ICD-10-CM | POA: Insufficient documentation

## 2019-10-10 DIAGNOSIS — R05 Cough: Secondary | ICD-10-CM | POA: Insufficient documentation

## 2019-10-10 LAB — CBC
HCT: 42.8 % (ref 39.0–52.0)
Hemoglobin: 14.1 g/dL (ref 13.0–17.0)
MCH: 29.1 pg (ref 26.0–34.0)
MCHC: 32.9 g/dL (ref 30.0–36.0)
MCV: 88.4 fL (ref 80.0–100.0)
Platelets: 290 10*3/uL (ref 150–400)
RBC: 4.84 MIL/uL (ref 4.22–5.81)
RDW: 14.6 % (ref 11.5–15.5)
WBC: 5.2 10*3/uL (ref 4.0–10.5)
nRBC: 0 % (ref 0.0–0.2)

## 2019-10-10 LAB — BASIC METABOLIC PANEL
Anion gap: 12 (ref 5–15)
BUN: 11 mg/dL (ref 6–20)
CO2: 20 mmol/L — ABNORMAL LOW (ref 22–32)
Calcium: 8.9 mg/dL (ref 8.9–10.3)
Chloride: 104 mmol/L (ref 98–111)
Creatinine, Ser: 0.97 mg/dL (ref 0.61–1.24)
GFR calc Af Amer: >60 mL/min (ref >60)
GFR calc non Af Amer: >60 mL/min (ref >60)
Glucose, Bld: 173 mg/dL — ABNORMAL HIGH (ref 70–99)
Potassium: 3.4 mmol/L — ABNORMAL LOW (ref 3.5–5.1)
Sodium: 136 mmol/L (ref 135–145)

## 2019-10-10 LAB — SARS CORONAVIRUS 2 (TAT 6-24 HRS): SARS Coronavirus 2: NEGATIVE

## 2019-10-10 NOTE — ED Provider Notes (Signed)
[facility] Emergency Department Provider Note   ____________________________________________    I have reviewed the triage vital signs and the nursing notes.   HISTORY  Chief Complaint Nausea and Dizziness     HPI Logan Lucas is a 43 y.o. male who presents with complaints of nausea dizziness, chills decreased appetite x2 to 3 days.  He reports that he was tested for coronavirus several months ago but is concerned that he needs to be tested again.  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  Denies abdominal pain.  Is not take anything for this.  No sick contacts reported.  No shortness of breath, occasional cough.  Past Medical History:  Diagnosis Date  . Asthma   . Hypertension     Patient Active Problem List   Diagnosis Date Noted  . Chest pain 06/17/2018    Past Surgical History:  Procedure Laterality Date  . none      Prior to Admission medications   Medication Sig Start Date End Date Taking? Authorizing Provider  cyclobenzaprine (FLEXERIL) 5 MG tablet Take 1-2 tablets 3 times daily as needed 08/21/19   Laban Emperor, PA-C  hydrochlorothiazide (HYDRODIURIL) 25 MG tablet Take 1 tablet (25 mg total) by mouth daily. 07/06/19   Johnn Hai, PA-C  ibuprofen (ADVIL) 600 MG tablet Take 1 tablet (600 mg total) by mouth every 6 (six) hours as needed. 08/21/19   Laban Emperor, PA-C  metoprolol tartrate (LOPRESSOR) 25 MG tablet Take 0.5 tablets (12.5 mg total) by mouth 2 (two) times daily. 07/06/19 09/04/19  Johnn Hai, PA-C     Allergies Patient has no known allergies.  Family History  Problem Relation Age of Onset  . Diabetes Mother   . Cancer Father     Social History Social History   Tobacco Use  . Smoking status: Current Every Day Smoker    Packs/day: 1.00    Years: 5.00    Pack years: 5.00    Types: Cigarettes  . Smokeless tobacco: Never Used  Substance Use Topics  . Alcohol use: Yes   Alcohol/week: 7.0 standard drinks    Types: 2 Cans of beer, 5 Shots of liquor per week  . Drug use: No    Frequency: 1.0 times per week    Review of Systems  Constitutional: As above Eyes: No visual changes.  ENT: No sore throat. Cardiovascular: Denies chest pain. Respiratory: As above Gastrointestinal: No abdominal pain.  Genitourinary: Negative for dysuria. Musculoskeletal: Negative for back pain. Skin: Negative for rash. Neurological: Negative for headaches   ____________________________________________   PHYSICAL EXAM:  VITAL SIGNS: ED Triage Vitals  Enc Vitals Group     BP 10/10/19 0301 (!) 148/104     Pulse Rate 10/10/19 0301 90     Resp 10/10/19 0301 16     Temp 10/10/19 0301 98.8 F (37.1 C)     Temp Source 10/10/19 0301 Oral     SpO2 10/10/19 0301 99 %     Weight 10/10/19 0258 117.9 kg (260 lb)     Height 10/10/19 0258 1.753 m (5\' 9" )     Head Circumference --      Peak Flow --      Pain Score 10/10/19 0258 0     Pain Loc --      Pain Edu? --      Excl. in [place]? --     Constitutional: Alert and oriented.   Nose: No congestion/rhinnorhea.  Mouth/Throat: Mucous membranes are moist.   Neck:  Painless ROM Cardiovascular: Normal rate, regular rhythm. Grossly normal heart sounds.  Good peripheral circulation. Respiratory: Normal respiratory effort.  No retractions. Lungs CTAB. Gastrointestinal: Soft and nontender. No distention.    Musculoskeletal:  Warm and well perfused Neurologic:  Normal speech and language. No gross focal neurologic deficits are appreciated.  Skin:  Skin is warm, dry and intact. No rash noted. Psychiatric: Mood and affect are normal. Speech and behavior are normal.  ____________________________________________   LABS (all labs ordered are listed, but only abnormal results are displayed)  Labs Reviewed  BASIC METABOLIC PANEL - Abnormal; Notable for the following components:      Result Value   Potassium 3.4 (*)    CO2 20 (*)     Glucose, Bld 173 (*)    All other components within normal limits  SARS CORONAVIRUS 2 (TAT 6-24 HRS)  CBC   ____________________________________________  EKG  None ____________________________________________  RADIOLOGY  None ____________________________________________   PROCEDURES  Procedure(s) performed: No  Procedures   Critical Care performed: No ____________________________________________   INITIAL IMPRESSION / ASSESSMENT AND PLAN / ED COURSE  Pertinent labs & imaging results that were available during my care of the patient were reviewed by me and considered in my medical decision making (see chart for details).  Patient presents with symptoms as described above, suspicious for viral syndrome, reassuring exam.  Lab work unremarkable.  Recommend supportive care, contact precautions/droplet precautions, will reswab today.  Out of work until test results return    ____________________________________________   FINAL CLINICAL IMPRESSION(S) / ED DIAGNOSES  Final diagnoses:  Suspected COVID-19 virus infection        Note:  This document was prepared using Dragon voice recognition software and may include unintentional dictation errors.   [name], MD 10/10/19 [phone]

## 2019-10-10 NOTE — ED Triage Notes (Signed)
Pt arrives to ED via POV from work with multiple c/o's. Pt reports dizziness, lightheadedness, vomiting, non-productive cough, loss of taste and sore throat since Monday. 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 Pt states last Covid test was "a couple months ago" and was negative; denies any recent known contacts. Pt states 2 episodes of emesis over the last 24 hrs. Pt is A&O, in NAD; RR even, regular, and unlabored.

## 2019-10-12 ENCOUNTER — Telehealth: Payer: Self-pay | Admitting: *Deleted

## 2019-10-14 ENCOUNTER — Encounter: Payer: Self-pay | Admitting: Medical Oncology

## 2019-10-14 ENCOUNTER — Other Ambulatory Visit: Payer: Self-pay

## 2019-10-14 ENCOUNTER — Inpatient Hospital Stay
Admission: EM | Admit: 2019-10-14 | Discharge: 2019-10-17 | DRG: 391 | Disposition: A | Discharge status: Home / Self Care | Payer: Self-pay | Attending: Internal Medicine | Admitting: Internal Medicine

## 2019-10-14 ENCOUNTER — Inpatient Hospital Stay: Payer: Self-pay

## 2019-10-14 ENCOUNTER — Emergency Department: Payer: Self-pay

## 2019-10-14 DIAGNOSIS — I10 Essential (primary) hypertension: Secondary | ICD-10-CM | POA: Diagnosis present

## 2019-10-14 DIAGNOSIS — Z23 Encounter for immunization: Secondary | ICD-10-CM

## 2019-10-14 DIAGNOSIS — F17210 Nicotine dependence, cigarettes, uncomplicated: Secondary | ICD-10-CM | POA: Diagnosis present

## 2019-10-14 DIAGNOSIS — K859 Acute pancreatitis without necrosis or infection, unspecified: Secondary | ICD-10-CM

## 2019-10-14 DIAGNOSIS — Z833 Family history of diabetes mellitus: Secondary | ICD-10-CM

## 2019-10-14 DIAGNOSIS — Z20828 Contact with and (suspected) exposure to other viral communicable diseases: Secondary | ICD-10-CM | POA: Diagnosis present

## 2019-10-14 DIAGNOSIS — J45909 Unspecified asthma, uncomplicated: Secondary | ICD-10-CM | POA: Diagnosis present

## 2019-10-14 DIAGNOSIS — K5900 Constipation, unspecified: Secondary | ICD-10-CM | POA: Diagnosis present

## 2019-10-14 DIAGNOSIS — Z79899 Other long term (current) drug therapy: Secondary | ICD-10-CM

## 2019-10-14 DIAGNOSIS — E860 Dehydration: Secondary | ICD-10-CM

## 2019-10-14 DIAGNOSIS — R1084 Generalized abdominal pain: Secondary | ICD-10-CM

## 2019-10-14 DIAGNOSIS — K921 Melena: Secondary | ICD-10-CM | POA: Diagnosis present

## 2019-10-14 DIAGNOSIS — K760 Fatty (change of) liver, not elsewhere classified: Secondary | ICD-10-CM | POA: Diagnosis present

## 2019-10-14 DIAGNOSIS — K2980 Duodenitis without bleeding: Principal | ICD-10-CM | POA: Diagnosis present

## 2019-10-14 LAB — URINALYSIS, COMPLETE (UACMP) WITH MICROSCOPIC
Bacteria, UA: NONE SEEN
Bilirubin Urine: NEGATIVE
Glucose, UA: NEGATIVE mg/dL
Ketones, ur: NEGATIVE mg/dL
Leukocytes,Ua: NEGATIVE
Nitrite: NEGATIVE
Protein, ur: 30 mg/dL — AB
Specific Gravity, Urine: 1.031 — ABNORMAL HIGH (ref 1.005–1.030)
pH: 5 (ref 5.0–8.0)

## 2019-10-14 LAB — CBC
HCT: 39.8 % (ref 39.0–52.0)
Hemoglobin: 13.6 g/dL (ref 13.0–17.0)
MCH: 29.6 pg (ref 26.0–34.0)
MCHC: 34.2 g/dL (ref 30.0–36.0)
MCV: 86.7 fL (ref 80.0–100.0)
Platelets: 227 10*3/uL (ref 150–400)
RBC: 4.59 MIL/uL (ref 4.22–5.81)
RDW: 14.4 % (ref 11.5–15.5)
WBC: 16.5 10*3/uL — ABNORMAL HIGH (ref 4.0–10.5)
nRBC: 0.1 % (ref 0.0–0.2)

## 2019-10-14 LAB — COMPREHENSIVE METABOLIC PANEL
ALT: 30 U/L (ref 0–44)
AST: 28 U/L (ref 15–41)
Albumin: 4.4 g/dL (ref 3.5–5.0)
Alkaline Phosphatase: 73 U/L (ref 38–126)
Anion gap: 15 (ref 5–15)
BUN: 13 mg/dL (ref 6–20)
CO2: 26 mmol/L (ref 22–32)
Calcium: 9.2 mg/dL (ref 8.9–10.3)
Chloride: 92 mmol/L — ABNORMAL LOW (ref 98–111)
Creatinine, Ser: 0.98 mg/dL (ref 0.61–1.24)
GFR calc Af Amer: >60 mL/min (ref >60)
GFR calc non Af Amer: >60 mL/min (ref >60)
Glucose, Bld: 185 mg/dL — ABNORMAL HIGH (ref 70–99)
Potassium: 3 mmol/L — ABNORMAL LOW (ref 3.5–5.1)
Sodium: 133 mmol/L — ABNORMAL LOW (ref 135–145)
Total Bilirubin: 1 mg/dL (ref 0.3–1.2)
Total Protein: 7.5 g/dL (ref 6.5–8.1)

## 2019-10-14 LAB — SARS CORONAVIRUS 2 (TAT 6-24 HRS): SARS Coronavirus 2: NEGATIVE

## 2019-10-14 LAB — LIPASE, BLOOD: Lipase: 59 U/L — ABNORMAL HIGH (ref 11–51)

## 2019-10-14 MED ORDER — ONDANSETRON HCL 4 MG/2ML IJ SOLN: 4.0000 mg | Freq: Four times a day (QID) | INTRAMUSCULAR | Status: DC | PRN

## 2019-10-14 MED ORDER — IOHEXOL 300 MG/ML  SOLN
100.0000 mL | Freq: Once | INTRAMUSCULAR | Status: AC | PRN
  Administered 2019-10-14: 100 mL via INTRAVENOUS

## 2019-10-14 MED ORDER — ONDANSETRON HCL 4 MG/2ML IJ SOLN
4.0000 mg | Freq: Once | INTRAMUSCULAR | Status: AC
  Administered 2019-10-14: 4 mg via INTRAVENOUS
  Filled 2019-10-14: qty 2

## 2019-10-14 MED ORDER — METRONIDAZOLE IN NACL 5-0.79 MG/ML-% IV SOLN
500.0000 mg | Freq: Once | INTRAVENOUS | Status: AC
  Administered 2019-10-14: 500 mg via INTRAVENOUS
  Filled 2019-10-14: qty 100

## 2019-10-14 MED ORDER — SODIUM CHLORIDE 0.9 % IV SOLN
2.0000 g | Freq: Once | INTRAVENOUS | Status: AC
  Administered 2019-10-14: 2 g via INTRAVENOUS
  Filled 2019-10-14: qty 20

## 2019-10-14 MED ORDER — ACETAMINOPHEN 650 MG RE SUPP: 650.0000 mg | Freq: Four times a day (QID) | RECTAL | Status: DC | PRN

## 2019-10-14 MED ORDER — OXYCODONE HCL 5 MG PO TABS
5.0000 mg | ORAL_TABLET | ORAL | Status: DC | PRN
  Administered 2019-10-14 – 2019-10-17 (×7): 5 mg via ORAL
  Filled 2019-10-14 (×9): qty 1

## 2019-10-14 MED ORDER — ACETAMINOPHEN 325 MG PO TABS
650.0000 mg | ORAL_TABLET | Freq: Four times a day (QID) | ORAL | Status: DC | PRN
  Administered 2019-10-15: 650 mg via ORAL
  Filled 2019-10-14: qty 2

## 2019-10-14 MED ORDER — ENOXAPARIN SODIUM 40 MG/0.4ML ~~LOC~~ SOLN
40.0000 mg | SUBCUTANEOUS | Status: DC
  Administered 2019-10-14 – 2019-10-16 (×3): 40 mg via SUBCUTANEOUS
  Filled 2019-10-14 (×4): qty 0.4

## 2019-10-14 MED ORDER — METRONIDAZOLE IN NACL 5-0.79 MG/ML-% IV SOLN
500.0000 mg | Freq: Three times a day (TID) | INTRAVENOUS | Status: DC
  Administered 2019-10-14 – 2019-10-17 (×8): 500 mg via INTRAVENOUS
  Filled 2019-10-14 (×12): qty 100

## 2019-10-14 MED ORDER — INFLUENZA VAC SPLIT QUAD 0.5 ML IM SUSY
0.5000 mL | PREFILLED_SYRINGE | INTRAMUSCULAR | Status: AC
  Administered 2019-10-16: 0.5 mL via INTRAMUSCULAR
  Filled 2019-10-14: qty 0.5

## 2019-10-14 MED ORDER — ONDANSETRON HCL 4 MG PO TABS
4.0000 mg | ORAL_TABLET | Freq: Four times a day (QID) | ORAL | Status: DC | PRN
  Filled 2019-10-14: qty 1

## 2019-10-14 MED ORDER — SODIUM CHLORIDE 0.9 % IV BOLUS
1000.0000 mL | Freq: Once | INTRAVENOUS | Status: AC
  Administered 2019-10-14: 1000 mL via INTRAVENOUS

## 2019-10-14 MED ORDER — LACTATED RINGERS IV SOLN
INTRAVENOUS | Status: DC
  Administered 2019-10-14 – 2019-10-17 (×8): via INTRAVENOUS

## 2019-10-14 MED ORDER — HYDROMORPHONE HCL 1 MG/ML IJ SOLN
1.0000 mg | Freq: Once | INTRAMUSCULAR | Status: AC
  Administered 2019-10-14: 1 mg via INTRAVENOUS
  Filled 2019-10-14: qty 1

## 2019-10-14 MED ORDER — SENNOSIDES-DOCUSATE SODIUM 8.6-50 MG PO TABS
1.0000 | ORAL_TABLET | Freq: Two times a day (BID) | ORAL | Status: DC
  Administered 2019-10-14 – 2019-10-16 (×4): 1 via ORAL
  Filled 2019-10-14 (×6): qty 1

## 2019-10-14 MED ORDER — PANTOPRAZOLE SODIUM 40 MG IV SOLR
40.0000 mg | Freq: Two times a day (BID) | INTRAVENOUS | Status: DC
  Administered 2019-10-14 – 2019-10-17 (×6): 40 mg via INTRAVENOUS
  Filled 2019-10-14 (×7): qty 40

## 2019-10-14 MED ORDER — MORPHINE SULFATE (PF) 2 MG/ML IV SOLN
2.0000 mg | INTRAVENOUS | Status: DC | PRN
  Administered 2019-10-14 – 2019-10-17 (×10): 2 mg via INTRAVENOUS
  Filled 2019-10-14 (×10): qty 1

## 2019-10-14 MED ORDER — SODIUM CHLORIDE 0.9 % IV SOLN
2.0000 g | INTRAVENOUS | Status: DC
  Administered 2019-10-15 – 2019-10-17 (×3): 2 g via INTRAVENOUS
  Filled 2019-10-14: qty 2
  Filled 2019-10-14: qty 20
  Filled 2019-10-14 (×2): qty 2

## 2019-10-14 NOTE — ED Notes (Signed)
Pt given denture cup to place dentures in. Pt has dentures at bedside.

## 2019-10-14 NOTE — ED Provider Notes (Signed)
[facility] Emergency Department Provider Note  ____________________________________________   First MD Initiated Contact with Patient 10/14/19 0818     (approximate)  I have reviewed the triage vital signs and the nursing notes.    HISTORY  Chief Complaint Abdominal Pain and Back Pain    HPI Logan Lucas is a 43 y.o. male  HTN, asthma here with abdominal pain. Pt reports that for the pas 3 days, he has had progressively worsening aching, gnawing lower abd pain with fullness. He has had associated nausea, vomiting, and diarrhea. He has now had a small amount of blood in his stool as well. Denies any fevers. No CP, SOB. Pain is worse w/ eating and palpation. No h/o similar episodes. No h/o diverticulitis.        Past Medical History:  Diagnosis Date   Asthma    Hypertension     Patient Active Problem List   Diagnosis Date Noted   Duodenitis 10/14/2019   Chest pain 06/17/2018    Past Surgical History:  Procedure Laterality Date   none      Prior to Admission medications   Medication Sig Start Date End Date Taking? Authorizing Provider  hydrochlorothiazide (HYDRODIURIL) 25 MG tablet Take 1 tablet (25 mg total) by mouth daily. 07/06/19  Yes Letitia Neri L, PA-C  metoprolol tartrate (LOPRESSOR) 25 MG tablet Take 0.5 tablets (12.5 mg total) by mouth 2 (two) times daily. 07/06/19 10/14/19 Yes Johnn Hai, PA-C  cyclobenzaprine (FLEXERIL) 5 MG tablet Take 1-2 tablets 3 times daily as needed Patient not taking: Reported on 10/14/2019 08/21/19   Laban Emperor, PA-C  ibuprofen (ADVIL) 600 MG tablet Take 1 tablet (600 mg total) by mouth every 6 (six) hours as needed. Patient not taking: Reported on 10/14/2019 08/21/19   Laban Emperor, PA-C    Allergies Patient has no known allergies.  Family History  Problem Relation Age of Onset   Diabetes Mother    Cancer Father     Social History Social History   Tobacco Use    Smoking status: Current Every Day Smoker    Packs/day: 1.00    Years: 5.00    Pack years: 5.00    Types: Cigarettes   Smokeless tobacco: Never Used  Substance Use Topics   Alcohol use: Yes    Alcohol/week: 7.0 standard drinks    Types: 2 Cans of beer, 5 Shots of liquor per week   Drug use: No    Frequency: 1.0 times per week    Review of Systems  Review of Systems  Constitutional: Positive for fatigue. Negative for chills and fever.  HENT: Negative for sore throat.   Respiratory: Negative for shortness of breath.   Cardiovascular: Negative for chest pain.  Gastrointestinal: Positive for abdominal distention, abdominal pain and nausea.  Genitourinary: Negative for flank pain.  Musculoskeletal: Negative for neck pain.  Skin: Negative for rash and wound.  Allergic/Immunologic: Negative for immunocompromised state.  Neurological: Positive for weakness. Negative for numbness.  Hematological: Does not bruise/bleed easily.  All other systems reviewed and are negative.    ____________________________________________  PHYSICAL EXAM:      VITAL SIGNS: ED Triage Vitals [10/14/19 0736]  Enc Vitals Group     BP (!) 134/91     Pulse Rate (!) 105     Resp 18     Temp 98.9 F (37.2 C)     Temp Source Oral     SpO2 100 %     Weight  257 lb 15 oz (117 kg)     Height 5\' 9"  (1.753 m)     Head Circumference      Peak Flow      Pain Score 8     Pain Loc      Pain Edu?      Excl. in [place]?      Physical Exam Vitals signs and nursing note reviewed.  Constitutional:      General: He is not in acute distress.    Appearance: He is well-developed.  HENT:     Head: Normocephalic and atraumatic.     Mouth/Throat:     Mouth: Mucous membranes are dry.  Eyes:     Conjunctiva/sclera: Conjunctivae normal.  Neck:     Musculoskeletal: Neck supple.  Cardiovascular:     Rate and Rhythm: Normal rate and regular rhythm.     Heart sounds: Normal heart sounds. No murmur. No friction rub.    Pulmonary:     Effort: Pulmonary effort is normal. No respiratory distress.     Breath sounds: Normal breath sounds. No wheezing or rales.  Abdominal:     General: There is no distension.     Palpations: Abdomen is soft.     Tenderness: There is abdominal tenderness in the right lower quadrant, suprapubic area and left lower quadrant. There is no guarding or rebound.  Skin:    General: Skin is warm.     Capillary Refill: Capillary refill takes less than 2 seconds.  Neurological:     Mental Status: He is alert and oriented to person, place, and time.     Motor: No abnormal muscle tone.       ____________________________________________   LABS (all labs ordered are listed, but only abnormal results are displayed)  Labs Reviewed  LIPASE, BLOOD - Abnormal; Notable for the following components:      Result Value   Lipase 59 (*)    All other components within normal limits  COMPREHENSIVE METABOLIC PANEL - Abnormal; Notable for the following components:   Sodium 133 (*)    Potassium 3.0 (*)    Chloride 92 (*)    Glucose, Bld 185 (*)    All other components within normal limits  CBC - Abnormal; Notable for the following components:   WBC 16.5 (*)    All other components within normal limits  URINALYSIS, COMPLETE (UACMP) WITH MICROSCOPIC - Abnormal; Notable for the following components:   Color, Urine AMBER (*)    APPearance HAZY (*)    Specific Gravity, Urine 1.031 (*)    Hgb urine dipstick SMALL (*)    Protein, ur 30 (*)    All other components within normal limits  SARS CORONAVIRUS 2 (TAT 6-24 HRS)    ____________________________________________  EKG: None ________________________________________  RADIOLOGY All imaging, including plain films, CT scans, and ultrasounds, independently reviewed by me, and interpretations confirmed via formal radiology reads.  ED MD interpretation:   CT A/P: stranding around duodenum, pancreas, extending to and involving  appendix  Official radiology report(s): Ct Abdomen Pelvis W Contrast  Result Date: 10/14/2019 CLINICAL DATA:  Acute generalized abdominal pain, nausea, vomiting. EXAM: CT ABDOMEN AND PELVIS WITH CONTRAST TECHNIQUE: Multidetector CT imaging of the abdomen and pelvis was performed using the standard protocol following bolus administration of intravenous contrast. CONTRAST:  147mL OMNIPAQUE IOHEXOL 300 MG/ML  SOLN COMPARISON:  None. FINDINGS: Lower chest: No acute abnormality. Hepatobiliary: No gallstones or biliary dilatation is noted. Hepatic steatosis is noted. Pancreas: Unremarkable.  No pancreatic ductal dilatation or surrounding inflammatory changes. Spleen: Normal in size without focal abnormality. Adrenals/Urinary Tract: 1.8 cm left adrenal nodule is noted. Right adrenal gland is unremarkable. Left renal cyst is noted. No hydronephrosis or renal obstruction is noted. No renal or ureteral calculi are noted. Urinary bladder is unremarkable. Stomach/Bowel: Stomach is within normal limits. No abnormal bowel dilatation is noted. Most of the appendix appears to be air-filled and nondilated. However, there does appear to be focal wall thickening involving a midportion of the appendix with a large amount of inflammatory changes in the mesenteric region. Vascular/Lymphatic: No significant vascular findings are present. No enlarged abdominal or pelvic lymph nodes. Reproductive: Prostate is unremarkable. Other: No hernia is noted. As noted above, large amount of inflammatory stranding is seen involving the central mesenteric region which surrounds a portion of the appendix. Musculoskeletal: No acute or significant osseous findings. IMPRESSION: Inflammatory stranding is noted in the mesenteric region as well as retroperitoneal region which extends to the right lower quadrant; etiology is uncertain, but may be secondary to duodenal inflammation or possibly pancreatitis. The appendix is nondilated and predominantly  air-filled, except for a segment in its midportion which demonstrates a thickened wall and is surrounded by the inflammatory changes previously described. It is felt that this most likely is secondary inflammation due to the previously described inflammatory changes, but acute appendicitis cannot be excluded but felt to be unlikely. Clinical correlation is recommended. These results were called by telephone at the time of interpretation on 10/14/2019 at 10:01 am to provider [facility] , who verbally acknowledged these results. Hepatic steatosis. Electronically Signed   By: Marijo Conception M.D.   On: 10/14/2019 10:02   US Abdomen Limited Ruq  Result Date: 10/14/2019 CLINICAL DATA:  Abdominal pain and nausea EXAM: ULTRASOUND ABDOMEN LIMITED RIGHT UPPER QUADRANT COMPARISON:  CT abdomen and pelvis October 14, 2019 FINDINGS: Gallbladder: No gallstones or wall thickening visualized. There is no pericholecystic fluid. No sonographic Murphy sign noted by sonographer. Common bile duct: Diameter: 4 mm. No intrahepatic or extrahepatic biliary duct dilatation. Liver: No focal lesion identified. Liver echogenicity is increased diffusely. Portal vein is patent on color Doppler imaging with normal direction of blood flow towards the liver. Other: None. IMPRESSION: Diffuse increase in liver echogenicity, a finding indicative of hepatic steatosis. While no focal liver lesions are evident on this study, it must be cautioned that the sensitivity of ultrasound for detection of focal liver lesions is diminished significantly in this circumstance. Study otherwise unremarkable. Electronically Signed   By: Lowella Grip III M.D.   On: 10/14/2019 12:45    ____________________________________________  PROCEDURES   Procedure(s) performed (including Critical Care):  Procedures  ____________________________________________  INITIAL IMPRESSION / MDM / [place] / ED COURSE  As part of my medical decision  making, I reviewed the following data within the [facility] notes reviewed and incorporated, Old chart reviewed, Notes from prior ED visits, and [place] Controlled Substance Database       *Logan Lucas was evaluated in Emergency Department on 10/14/2019 for the symptoms described in the history of present illness. He was evaluated in the context of the global COVID-19 pandemic, which necessitated consideration that the patient might be at risk for infection with the SARS-CoV-2 virus that causes COVID-19. Institutional protocols and algorithms that pertain to the evaluation of patients at risk for COVID-19 are in a state of rapid change based on information released by regulatory bodies including the  CDC and federal and [employer]. These policies and algorithms were followed during the patient's care in the ED.  Some ED evaluations and interventions may be delayed as a result of limited staffing during the pandemic.*  Clinical Course as of Oct 13 1505  Wed Oct 14, 2019  0850 43 yo M here with lower abd pain, diffuse. DDx includes colitis, diverticulitis, less likely UTI or prostatitis. No h/o kidney stones. No fever but labs do show increasing leukocytosis, mild lipase elevation. CT pending. IVF, analgesia.   [CI]    Clinical Course User Index [CI] Duffy Bruce, MD    Medical Decision Making:  As above. Interestingly, CT shows diffuse stranding from duodenum/pancreas extending to and involving pancreas. Unclear primary source. CLinically, suspect pancreatitis/duodenitis though cannot r/o early appy. Discussed with Dr. Celine Ahr who doubts acute appy, recommends ABX and obs. Will be available for formal consult if needed.  ____________________________________________  FINAL CLINICAL IMPRESSION(S) / ED DIAGNOSES  Final diagnoses:  Generalized abdominal pain  Dehydration     MEDICATIONS GIVEN DURING THIS VISIT:  Medications  lactated ringers infusion (  Intravenous New Bag/Given 10/14/19 1328)  enoxaparin (LOVENOX) injection 40 mg (has no administration in time range)  acetaminophen (TYLENOL) tablet 650 mg (has no administration in time range)    Or  acetaminophen (TYLENOL) suppository 650 mg (has no administration in time range)  oxyCODONE (Oxy IR/ROXICODONE) immediate release tablet 5 mg (has no administration in time range)  ondansetron (ZOFRAN) tablet 4 mg (has no administration in time range)    Or  ondansetron (ZOFRAN) injection 4 mg (has no administration in time range)  morphine 2 MG/ML injection 2 mg (2 mg Intravenous Given 10/14/19 1249)  cefTRIAXone (ROCEPHIN) 2 g in sodium chloride 0.9 % 100 mL IVPB (has no administration in time range)  metroNIDAZOLE (FLAGYL) IVPB 500 mg (has no administration in time range)  pantoprazole (PROTONIX) injection 40 mg (has no administration in time range)  sodium chloride 0.9 % bolus 1,000 mL (0 mLs Intravenous Stopped 10/14/19 1058)  HYDROmorphone (DILAUDID) injection 1 mg (1 mg Intravenous Given 10/14/19 0853)  ondansetron (ZOFRAN) injection 4 mg (4 mg Intravenous Given 10/14/19 0853)  iohexol (OMNIPAQUE) 300 MG/ML solution 100 mL (100 mLs Intravenous Contrast Given 10/14/19 0920)  cefTRIAXone (ROCEPHIN) 2 g in sodium chloride 0.9 % 100 mL IVPB (0 g Intravenous Stopped 10/14/19 1248)    And  metroNIDAZOLE (FLAGYL) IVPB 500 mg (0 mg Intravenous Stopped 10/14/19 1324)     ED Discharge Orders    None       Note:  This document was prepared using Dragon voice recognition software and may include unintentional dictation errors.   Duffy Bruce, MD 10/14/19 [phone]

## 2019-10-14 NOTE — H&P (Signed)
[facility] History and Physical   Patient: Logan Lucas [id number]   PCP: Patient, No Pcp Per DOB: 09-13-1976   DOA: 10/14/2019   DOS: 10/14/2019   DOS: the patient was seen and examined on 10/14/2019  Patient coming from: The patient is coming from Home  Chief Complaint: Abdominal pain  HPI: Logan Lucas is a 43 y.o. male with Past medical history of asthma and hypertension. Patient was recently seen in the ER for concerns for abdominal pain and nausea. After being discharged from the ER patient continues to have abdominal discomfort. Last night the abdominal pain started more on the left side and radiated across his whole lower abdomen. This is associated with severe nausea as well as dry heaving. No obvious actual vomiting. Patient denies having any diarrhea but passing gas rarely. No chest pain or shortness of breath no cough. No fever no chills. Patient reports no alcohol abuse no drug abuse. Mentions that he does not have any constipation at his baseline.  ED Course: CT abdomen shows evidence of inflammation in the duodenum as well as pancreas.  There is also concern for possible appendix involvement. EDP discussed with general surgery who felt that the patient does not have acute appendicitis and recommend medicine admission for duodenitis as well as pancreatitis.  At his baseline ambulates without assistance independent for most of his ADL;  manages his medication on his own.  Review of Systems: as mentioned in the history of present illness.  All other systems reviewed and are negative.  Past Medical History:  Diagnosis Date   Asthma    Hypertension    Past Surgical History:  Procedure Laterality Date   none     Social History:  reports that he has been smoking cigarettes. He has a 5.00 pack-year smoking history. He has never used smokeless tobacco. He reports current alcohol use of about 7.0 standard drinks of alcohol per week. He  reports that he does not use drugs.  No Known Allergies   Family history reviewed and not pertinent Family History  Problem Relation Age of Onset   Diabetes Mother    Cancer Father      Prior to Admission medications   Medication Sig Start Date End Date Taking? Authorizing Provider  hydrochlorothiazide (HYDRODIURIL) 25 MG tablet Take 1 tablet (25 mg total) by mouth daily. 07/06/19  Yes Letitia Neri L, PA-C  metoprolol tartrate (LOPRESSOR) 25 MG tablet Take 0.5 tablets (12.5 mg total) by mouth 2 (two) times daily. 07/06/19 10/14/19 Yes Johnn Hai, PA-C  cyclobenzaprine (FLEXERIL) 5 MG tablet Take 1-2 tablets 3 times daily as needed Patient not taking: Reported on 10/14/2019 08/21/19   Laban Emperor, PA-C  ibuprofen (ADVIL) 600 MG tablet Take 1 tablet (600 mg total) by mouth every 6 (six) hours as needed. Patient not taking: Reported on 10/14/2019 08/21/19   Laban Emperor, PA-C    Physical Exam: Vitals:   10/14/19 0736 10/14/19 1149 10/14/19 1400  BP: (!) 134/91 (!) 138/99 (!) 147/90  Pulse: (!) 105 93 95  Resp: 18 18   Temp: 98.9 F (37.2 C)    TempSrc: Oral    SpO2: 100% 100% 99%  Weight: 117 kg    Height: 5\' 9"  (1.753 m)      General: alert and oriented to time, place, and person. Appear in moderate distress, affect emotionally labile Eyes: PERRL, Conjunctiva normal ENT: Oral Mucosa Clear, moist  Neck: no JVD, no Abnormal Mass Or lumps Cardiovascular: S1 and  S2 Present, no Murmur, peripheral pulses symmetrical Respiratory: good respiratory effort, Bilateral Air entry equal and Decreased, no signs of accessory muscle use, Clear to Auscultation, no Crackles, no wheezes Abdomen: Bowel Sound present, Soft and mild lower abdominal tenderness, no hernia Skin: no rashes  Extremities: no Pedal edema, no calf tenderness Neurologic: without any new focal findings Gait not checked due to patient safety concerns  Data Reviewed: I have personally reviewed and  interpreted labs, imaging as discussed below.  CBC: Recent Labs  Lab 10/10/19 0305 10/14/19 0739  WBC 5.2 16.5*  HGB 14.1 13.6  HCT 42.8 39.8  MCV 88.4 86.7  PLT 290 [id number]   Basic Metabolic Panel: Recent Labs  Lab 10/10/19 0305 10/14/19 0739  NA 136 133*  K 3.4* 3.0*  CL 104 92*  CO2 20* 26  GLUCOSE 173* 185*  BUN 11 13  CREATININE 0.97 0.98  CALCIUM 8.9 9.2   GFR: Estimated Creatinine Clearance: 126.4 mL/min (by C-G formula based on SCr of 0.98 mg/dL). Liver Function Tests: Recent Labs  Lab 10/14/19 0739  AST 28  ALT 30  ALKPHOS 73  BILITOT 1.0  PROT 7.5  ALBUMIN 4.4   Recent Labs  Lab 10/14/19 0739  LIPASE 59*   No results for input(s): AMMONIA in the last 168 hours. Coagulation Profile: No results for input(s): INR, PROTIME in the last 168 hours. Cardiac Enzymes: No results for input(s): CKTOTAL, CKMB, CKMBINDEX, TROPONINI in the last 168 hours. BNP (last 3 results) No results for input(s): PROBNP in the last 8760 hours. HbA1C: No results for input(s): HGBA1C in the last 72 hours. CBG: No results for input(s): GLUCAP in the last 168 hours. Lipid Profile: No results for input(s): CHOL, HDL, LDLCALC, TRIG, CHOLHDL, LDLDIRECT in the last 72 hours. Thyroid Function Tests: No results for input(s): TSH, T4TOTAL, FREET4, T3FREE, THYROIDAB in the last 72 hours. Anemia Panel: No results for input(s): VITAMINB12, FOLATE, FERRITIN, TIBC, IRON, RETICCTPCT in the last 72 hours. Urine analysis:    Component Value Date/Time   COLORURINE AMBER (A) 10/14/2019 0739   APPEARANCEUR HAZY (A) 10/14/2019 0739   APPEARANCEUR Hazy 02/10/2014 0521   LABSPEC 1.031 (H) 10/14/2019 0739   LABSPEC 1.024 02/10/2014 0521   PHURINE 5.0 10/14/2019 0739   GLUCOSEU NEGATIVE 10/14/2019 0739   GLUCOSEU Negative 02/10/2014 0521   HGBUR SMALL (A) 10/14/2019 0739   BILIRUBINUR NEGATIVE 10/14/2019 0739   BILIRUBINUR Negative 02/10/2014 0521   KETONESUR NEGATIVE 10/14/2019 0739    PROTEINUR 30 (A) 10/14/2019 0739   NITRITE NEGATIVE 10/14/2019 0739   LEUKOCYTESUR NEGATIVE 10/14/2019 0739   LEUKOCYTESUR Negative 02/10/2014 0521    Radiological Exams on Admission: Ct Abdomen Pelvis W Contrast  Result Date: 10/14/2019 CLINICAL DATA:  Acute generalized abdominal pain, nausea, vomiting. EXAM: CT ABDOMEN AND PELVIS WITH CONTRAST TECHNIQUE: Multidetector CT imaging of the abdomen and pelvis was performed using the standard protocol following bolus administration of intravenous contrast. CONTRAST:  160mL OMNIPAQUE IOHEXOL 300 MG/ML  SOLN COMPARISON:  None. FINDINGS: Lower chest: No acute abnormality. Hepatobiliary: No gallstones or biliary dilatation is noted. Hepatic steatosis is noted. Pancreas: Unremarkable. No pancreatic ductal dilatation or surrounding inflammatory changes. Spleen: Normal in size without focal abnormality. Adrenals/Urinary Tract: 1.8 cm left adrenal nodule is noted. Right adrenal gland is unremarkable. Left renal cyst is noted. No hydronephrosis or renal obstruction is noted. No renal or ureteral calculi are noted. Urinary bladder is unremarkable. Stomach/Bowel: Stomach is within normal limits. No abnormal bowel dilatation is noted. Most of  the appendix appears to be air-filled and nondilated. However, there does appear to be focal wall thickening involving a midportion of the appendix with a large amount of inflammatory changes in the mesenteric region. Vascular/Lymphatic: No significant vascular findings are present. No enlarged abdominal or pelvic lymph nodes. Reproductive: Prostate is unremarkable. Other: No hernia is noted. As noted above, large amount of inflammatory stranding is seen involving the central mesenteric region which surrounds a portion of the appendix. Musculoskeletal: No acute or significant osseous findings. IMPRESSION: Inflammatory stranding is noted in the mesenteric region as well as retroperitoneal region which extends to the right lower  quadrant; etiology is uncertain, but may be secondary to duodenal inflammation or possibly pancreatitis. The appendix is nondilated and predominantly air-filled, except for a segment in its midportion which demonstrates a thickened wall and is surrounded by the inflammatory changes previously described. It is felt that this most likely is secondary inflammation due to the previously described inflammatory changes, but acute appendicitis cannot be excluded but felt to be unlikely. Clinical correlation is recommended. These results were called by telephone at the time of interpretation on 10/14/2019 at 10:01 am to provider [facility] , who verbally acknowledged these results. Hepatic steatosis. Electronically Signed   By: Marijo Conception M.D.   On: 10/14/2019 10:02   US Abdomen Limited Ruq  Result Date: 10/14/2019 CLINICAL DATA:  Abdominal pain and nausea EXAM: ULTRASOUND ABDOMEN LIMITED RIGHT UPPER QUADRANT COMPARISON:  CT abdomen and pelvis October 14, 2019 FINDINGS: Gallbladder: No gallstones or wall thickening visualized. There is no pericholecystic fluid. No sonographic Murphy sign noted by sonographer. Common bile duct: Diameter: 4 mm. No intrahepatic or extrahepatic biliary duct dilatation. Liver: No focal lesion identified. Liver echogenicity is increased diffusely. Portal vein is patent on color Doppler imaging with normal direction of blood flow towards the liver. Other: None. IMPRESSION: Diffuse increase in liver echogenicity, a finding indicative of hepatic steatosis. While no focal liver lesions are evident on this study, it must be cautioned that the sensitivity of ultrasound for detection of focal liver lesions is diminished significantly in this circumstance. Study otherwise unremarkable. Electronically Signed   By: Lowella Grip III M.D.   On: 10/14/2019 12:45   I reviewed all nursing notes, pharmacy notes, vitals, pertinent old records.  Assessment/Plan 1.  Mild acute  pancreatitis. ?  Duodenitis. Concern for constipation. Concern for appendicitis-ruled out per general surgery. Presents with complaint of abdominal pain. CT abdomen shows evidence of duodenitis as well as acute pancreatitis. Mild elevation of lipase. Patient denies any alcohol abuse. We will get ultrasound abdomen for clear liquid gallbladder. Continue with n.p.o., IV pain medication IV fluid. Empiric treatment with IV ceftriaxone and Flagyl.  Also empirically treating with IV Protonix. Antiemetics as well. We will continue scheduled stool softener.  2.  Essential hypertension Blood pressure mildly elevated. If remains elevated may require something as needed. Holding HCTZ as well as metoprolol for now.  3.  History of asthma. Does not appear to be having any acute exacerbation. Monitor.  Nutrition: NPO  DVT Prophylaxis: Subcutaneous Lovenox  Advance goals of care discussion: Full code   Consults: non e  Family Communication: no family was present at bedside, at the time of interview.   Disposition: Admitted as inpatient, med-surge unit. Likely to be discharged home, in 2 days.  I have discussed plan of care as described above with RN and patient/family.  Author: Berle Mull, MD [facility] 10/14/2019 6:28 PM   To reach  On-call, see care teams to locate the attending and reach out to them via [email or web address]. If 7PM-7AM, please contact night-coverage If you still have difficulty reaching the attending provider, please page the [facility] (Director on Call) for [facility] on amion for assistance.

## 2019-10-14 NOTE — ED Notes (Signed)
Pt c/o of lower abd pain, states began as generalized upper abd pain and yesterday it moved to lower abd. States N&V. No vomiting in last 24 hours, just dry heaves. Worried about diverticulitis d/t 2 family members having it recently. Denies dysuria. Denies diarrhea or fevers. A&O, no distress noted.

## 2019-10-14 NOTE — ED Notes (Signed)
Patient transported to Ultrasound 

## 2019-10-14 NOTE — ED Notes (Signed)
Pt voicing frustration with NPO diet, MD at bedside to update on plan of care.  Pt given ice chips and half a cup of water to sip on. Provided pt education on reasoning for NPO diet.

## 2019-10-14 NOTE — ED Notes (Signed)
Pt provided hospital gown and grip socks. Given pt belongings bag to place work clothes into.

## 2019-10-14 NOTE — ED Triage Notes (Signed)
Pt reports that he has been having lower abd pain that radiates around to his lower back for about 1.5 days. Pt ambulatory without difficulty. Reports nausea but no V/D.

## 2019-10-15 DIAGNOSIS — K2980 Duodenitis without bleeding: Principal | ICD-10-CM

## 2019-10-15 DIAGNOSIS — K5900 Constipation, unspecified: Secondary | ICD-10-CM

## 2019-10-15 DIAGNOSIS — J45909 Unspecified asthma, uncomplicated: Secondary | ICD-10-CM

## 2019-10-15 DIAGNOSIS — K853 Drug induced acute pancreatitis without necrosis or infection: Secondary | ICD-10-CM

## 2019-10-15 LAB — CBC
HCT: 33.2 % — ABNORMAL LOW (ref 39.0–52.0)
Hemoglobin: 11.3 g/dL — ABNORMAL LOW (ref 13.0–17.0)
MCH: 29.2 pg (ref 26.0–34.0)
MCHC: 34 g/dL (ref 30.0–36.0)
MCV: 85.8 fL (ref 80.0–100.0)
Platelets: 177 10*3/uL (ref 150–400)
RBC: 3.87 MIL/uL — ABNORMAL LOW (ref 4.22–5.81)
RDW: 14.4 % (ref 11.5–15.5)
WBC: 11.9 10*3/uL — ABNORMAL HIGH (ref 4.0–10.5)
nRBC: 0 % (ref 0.0–0.2)

## 2019-10-15 LAB — COMPREHENSIVE METABOLIC PANEL
ALT: 19 U/L (ref 0–44)
AST: 18 U/L (ref 15–41)
Albumin: 3.2 g/dL — ABNORMAL LOW (ref 3.5–5.0)
Alkaline Phosphatase: 60 U/L (ref 38–126)
Anion gap: 13 (ref 5–15)
BUN: 8 mg/dL (ref 6–20)
CO2: 25 mmol/L (ref 22–32)
Calcium: 8.3 mg/dL — ABNORMAL LOW (ref 8.9–10.3)
Chloride: 96 mmol/L — ABNORMAL LOW (ref 98–111)
Creatinine, Ser: 0.78 mg/dL (ref 0.61–1.24)
GFR calc Af Amer: >60 mL/min (ref >60)
GFR calc non Af Amer: >60 mL/min (ref >60)
Glucose, Bld: 141 mg/dL — ABNORMAL HIGH (ref 70–99)
Potassium: 2.7 mmol/L — CL (ref 3.5–5.1)
Sodium: 134 mmol/L — ABNORMAL LOW (ref 135–145)
Total Bilirubin: 0.9 mg/dL (ref 0.3–1.2)
Total Protein: 5.9 g/dL — ABNORMAL LOW (ref 6.5–8.1)

## 2019-10-15 LAB — BASIC METABOLIC PANEL
Anion gap: 10 (ref 5–15)
BUN: 7 mg/dL (ref 6–20)
CO2: 24 mmol/L (ref 22–32)
Calcium: 8.2 mg/dL — ABNORMAL LOW (ref 8.9–10.3)
Chloride: 100 mmol/L (ref 98–111)
Creatinine, Ser: 0.76 mg/dL (ref 0.61–1.24)
GFR calc Af Amer: >60 mL/min (ref >60)
GFR calc non Af Amer: >60 mL/min (ref >60)
Glucose, Bld: 176 mg/dL — ABNORMAL HIGH (ref 70–99)
Potassium: 3 mmol/L — ABNORMAL LOW (ref 3.5–5.1)
Sodium: 134 mmol/L — ABNORMAL LOW (ref 135–145)

## 2019-10-15 LAB — LIPASE, BLOOD: Lipase: 33 U/L (ref 11–51)

## 2019-10-15 LAB — HIV ANTIBODY (ROUTINE TESTING W REFLEX): HIV Screen 4th Generation wRfx: NONREACTIVE

## 2019-10-15 LAB — MAGNESIUM: Magnesium: 1.6 mg/dL — ABNORMAL LOW (ref 1.7–2.4)

## 2019-10-15 MED ORDER — DOCUSATE SODIUM 100 MG PO CAPS
200.0000 mg | ORAL_CAPSULE | Freq: Once | ORAL | Status: AC
  Administered 2019-10-15: 200 mg via ORAL
  Filled 2019-10-15: qty 2

## 2019-10-15 MED ORDER — POTASSIUM CHLORIDE 10 MEQ/100ML IV SOLN
10.0000 meq | INTRAVENOUS | Status: AC
  Administered 2019-10-15 (×4): 10 meq via INTRAVENOUS
  Filled 2019-10-15 (×4): qty 100

## 2019-10-15 MED ORDER — SODIUM CHLORIDE 0.9 % IV SOLN
INTRAVENOUS | Status: DC | PRN
  Administered 2019-10-15: 250 mL via INTRAVENOUS
  Administered 2019-10-15: 1 mL via INTRAVENOUS
  Administered 2019-10-16: 50 mL via INTRAVENOUS
  Administered 2019-10-16 (×2): 20 mL via INTRAVENOUS

## 2019-10-15 MED ORDER — MAGNESIUM SULFATE 2 GM/50ML IV SOLN
2.0000 g | Freq: Once | INTRAVENOUS | Status: AC
  Administered 2019-10-15: 2 g via INTRAVENOUS
  Filled 2019-10-15: qty 50

## 2019-10-15 MED ORDER — BISACODYL 10 MG RE SUPP
10.0000 mg | Freq: Once | RECTAL | Status: DC
  Filled 2019-10-15: qty 1

## 2019-10-15 MED ORDER — POTASSIUM CHLORIDE CRYS ER 20 MEQ PO TBCR
40.0000 meq | EXTENDED_RELEASE_TABLET | Freq: Once | ORAL | Status: AC
  Administered 2019-10-15: 40 meq via ORAL
  Filled 2019-10-15: qty 2

## 2019-10-15 MED ORDER — ZOLPIDEM TARTRATE 5 MG PO TABS
5.0000 mg | ORAL_TABLET | Freq: Every evening | ORAL | Status: DC | PRN
  Administered 2019-10-15 – 2019-10-16 (×2): 5 mg via ORAL
  Filled 2019-10-15 (×2): qty 1

## 2019-10-15 NOTE — Progress Notes (Signed)
PROGRESS NOTE  DAE TARAS [id number] DOB: [birth year] DOA: 10/14/2019 PCP: Logan Lucas, No Pcp Per  Brief History   Logan Lucas is a 43 y.o. male with Past medical history of asthma and hypertension. Logan Lucas was recently seen in the ER for concerns for abdominal pain and nausea. After being discharged from the ER Logan Lucas continues to have abdominal discomfort. Last night the abdominal pain started more on the left side and radiated across his whole lower abdomen. This is associated with severe nausea as well as dry heaving. No obvious actual vomiting. Logan Lucas denies having any diarrhea but passing gas rarely. No chest pain or shortness of breath no cough. No fever no chills. Logan Lucas reports no alcohol abuse no drug abuse.Mentions that he does not have any constipation at his baseline.  ED Course: CT abdomen shows evidence of inflammation in the duodenum as well as pancreas.  There is also concern for possible appendix involvement. EDP discussed with general surgery who felt that the Logan Lucas does not have acute appendicitis and recommend medicine admission for duodenitis as well as pancreatitis.  The Logan Lucas has been admitted to a medical bed. He has been kept NPO. He is receiving antiemetics and pain control, as well as IV Rocephin.  Consultants  . None  Procedures  . None  Antibiotics   Anti-infectives (From admission, onward)   Start     Dose/Rate Route Frequency Ordered Stop   10/15/19 1000  cefTRIAXone (ROCEPHIN) 2 g in sodium chloride 0.9 % 100 mL IVPB     2 g 200 mL/hr over 30 Minutes Intravenous Every 24 hours 10/14/19 1205     10/14/19 2200  metroNIDAZOLE (FLAGYL) IVPB 500 mg     500 mg 100 mL/hr over 60 Minutes Intravenous Every 8 hours 10/14/19 1205     10/14/19 1030  cefTRIAXone (ROCEPHIN) 2 g in sodium chloride 0.9 % 100 mL IVPB     2 g 200 mL/hr over 30 Minutes Intravenous  Once 10/14/19 1021 10/14/19 1248   10/14/19 1030  metroNIDAZOLE (FLAGYL) IVPB 500  mg     500 mg 100 mL/hr over 60 Minutes Intravenous  Once 10/14/19 1021 10/14/19 1324    .  Subjective  The Logan Lucas is resting comfortably. No new complaints. He is interested in trying a clear liquid diet. He states that he is constipated.  Objective   Vitals:  Vitals:   10/14/19 2017 10/15/19 0557  BP: 133/74 117/74  Pulse: 99 93  Resp: 18 17  Temp: 98.9 F (37.2 C) 100 F (37.8 C)  SpO2: 100% 97%   Exam:  Constitutional:  . The Logan Lucas is awake, alert, and oriented x 3. No acute distress. Respiratory:  . No increased work of breathing. . No wheezes, rales, or rhonchi . No tactile fremitus Cardiovascular:  . Regular rate and rhythm . No murmurs, ectopy, or gallups. . No lateral PMI. No thrills. Abdomen:  . Abdomen is soft  It is diffusely and mildly tender and slightly distended . No hernias, masses, or organomegaly . Normoactive bowel sounds.  Musculoskeletal:  . No cyanosis, clubbing, or edema Skin:  . No rashes, lesions, ulcers . palpation of skin: no induration or nodules Neurologic:  . CN 2-12 intact . Sensation all 4 extremities intact Psychiatric:  . Mental status o Mood, affect appropriate o Orientation to person, place, time  . judgment and insight appear intact  I have personally reviewed the following:   Today's Data  . Vitals, BMP  Scheduled Meds: .  enoxaparin (LOVENOX) injection  40 mg Subcutaneous Q24H  . influenza vac split quadrivalent PF  0.5 mL Intramuscular Tomorrow-1000  . pantoprazole (PROTONIX) IV  40 mg Intravenous Q12H  . senna-docusate  1 tablet Oral BID   Continuous Infusions: . sodium chloride 50 mL/hr at 10/15/19 1349  . cefTRIAXone (ROCEPHIN)  IV Stopped (10/15/19 1312)  . lactated ringers 100 mL/hr at 10/15/19 1349  . metronidazole 500 mg (10/15/19 1350)    Active Problems:   Duodenitis   LOS: 1 day   A & P  Mild acute pancreatitis and duodenitis: The Logan Lucas has been kept NPO. He states that his pain is  improved and he is interested in a clear liquid diet. General surgery has ruled out appendicitis. CT abdomen demonstrated duodenitis and pancreatitis.Lipase is mildly elevated. The Logan Lucas denies alcohol abuse. RUQ ultrasound was performed and demonstrates only hepatic steatosis.  He is receiving IV Rocephin and Flagyl, antiemetics, and pain control. Would avoid HCTZ in the future as may be a cause for pancreatitis.  Constipation: The Logan Lucas states that he has not had a BM in several days. Will give docusate sodium and bisacodyl suppository.  Essential hypertension: Blood pressures have been well controlled for the past 24 hours without metoprolol or HCTZ. Will stop HCTZ as it may cause pancreatitis.  History of asthma: Noted and stable.   I have seen and examined this Logan Lucas myself. I have spent 35 minutes in his evaluation and care.  DVT Prophylaxis: Subcutaneous Lovenox CODE STATUS:Full code  Family Communication: no family was present at bedside, at the time of interview.  Disposition: Admitted as inpatient, med-surge unit.  Ghassan Coggeshall, DO [facility] Direct contact: see [email or web address]  7PM-7AM contact night coverage as above 10/15/2019, 2:07 PM  LOS: 1 day

## 2019-10-16 DIAGNOSIS — I10 Essential (primary) hypertension: Secondary | ICD-10-CM

## 2019-10-16 DIAGNOSIS — K760 Fatty (change of) liver, not elsewhere classified: Secondary | ICD-10-CM

## 2019-10-16 LAB — BASIC METABOLIC PANEL
Anion gap: 11 (ref 5–15)
BUN: <5 mg/dL — ABNORMAL LOW (ref 6–20)
CO2: 25 mmol/L (ref 22–32)
Calcium: 8.2 mg/dL — ABNORMAL LOW (ref 8.9–10.3)
Chloride: 101 mmol/L (ref 98–111)
Creatinine, Ser: 0.86 mg/dL (ref 0.61–1.24)
GFR calc Af Amer: >60 mL/min (ref >60)
GFR calc non Af Amer: >60 mL/min (ref >60)
Glucose, Bld: 134 mg/dL — ABNORMAL HIGH (ref 70–99)
Potassium: 3.1 mmol/L — ABNORMAL LOW (ref 3.5–5.1)
Sodium: 137 mmol/L (ref 135–145)

## 2019-10-16 LAB — CBC
HCT: 32.2 % — ABNORMAL LOW (ref 39.0–52.0)
Hemoglobin: 10.9 g/dL — ABNORMAL LOW (ref 13.0–17.0)
MCH: 29.1 pg (ref 26.0–34.0)
MCHC: 33.9 g/dL (ref 30.0–36.0)
MCV: 85.9 fL (ref 80.0–100.0)
Platelets: 181 10*3/uL (ref 150–400)
RBC: 3.75 MIL/uL — ABNORMAL LOW (ref 4.22–5.81)
RDW: 14.5 % (ref 11.5–15.5)
WBC: 9.4 10*3/uL (ref 4.0–10.5)
nRBC: 0 % (ref 0.0–0.2)

## 2019-10-16 LAB — MAGNESIUM: Magnesium: 2 mg/dL (ref 1.7–2.4)

## 2019-10-16 MED ORDER — POTASSIUM CHLORIDE 10 MEQ/100ML IV SOLN
10.0000 meq | INTRAVENOUS | Status: AC
  Administered 2019-10-16 (×4): 10 meq via INTRAVENOUS
  Filled 2019-10-16: qty 100

## 2019-10-16 MED ORDER — NICOTINE POLACRILEX 2 MG MT GUM
2.0000 mg | CHEWING_GUM | OROMUCOSAL | Status: DC | PRN
  Administered 2019-10-16: 2 mg via ORAL
  Filled 2019-10-16 (×2): qty 1

## 2019-10-16 NOTE — Progress Notes (Signed)
PROGRESS NOTE  Logan Lucas [id number] DOB: [birth year] DOA: 10/14/2019 PCP: Patient, No Pcp Per  Brief History   Logan Lucas is a 43 y.o. male with Past medical history of asthma and hypertension. Patient was recently seen in the ER for concerns for abdominal pain and nausea. After being discharged from the ER patient continues to have abdominal discomfort. Last night the abdominal pain started more on the left side and radiated across his whole lower abdomen. This is associated with severe nausea as well as dry heaving. No obvious actual vomiting. Patient denies having any diarrhea but passing gas rarely. No chest pain or shortness of breath no cough. No fever no chills. Patient reports no alcohol abuse no drug abuse.Mentions that he does not have any constipation at his baseline.  ED Course: CT abdomen shows evidence of inflammation in the duodenum as well as pancreas.  There is also concern for possible appendix involvement. EDP discussed with general surgery who felt that the patient does not have acute appendicitis and recommend medicine admission for duodenitis as well as pancreatitis.  The patient has been admitted to a medical bed. He has been kept NPO. He is receiving antiemetics and pain control, as well as IV Rocephin. His diet has been advanced to clear liquids yesterday and then full liquids today.   Consultants  . None  Procedures  . None  Antibiotics   Anti-infectives (From admission, onward)   Start     Dose/Rate Route Frequency Ordered Stop   10/15/19 1000  cefTRIAXone (ROCEPHIN) 2 g in sodium chloride 0.9 % 100 mL IVPB     2 g 200 mL/hr over 30 Minutes Intravenous Every 24 hours 10/14/19 1205     10/14/19 2200  metroNIDAZOLE (FLAGYL) IVPB 500 mg     500 mg 100 mL/hr over 60 Minutes Intravenous Every 8 hours 10/14/19 1205     10/14/19 1030  cefTRIAXone (ROCEPHIN) 2 g in sodium chloride 0.9 % 100 mL IVPB     2 g 200 mL/hr over 30 Minutes Intravenous   Once 10/14/19 1021 10/14/19 1248   10/14/19 1030  metroNIDAZOLE (FLAGYL) IVPB 500 mg     500 mg 100 mL/hr over 60 Minutes Intravenous  Once 10/14/19 1021 10/14/19 1324     Subjective  The patient is resting comfortably. No new complaints. He states that he wants to advance his diet further.  Objective   Vitals:  Vitals:   10/16/19 0452 10/16/19 1257  BP: 124/75 136/88  Pulse: 88 82  Resp: 20 20  Temp: 98.3 F (36.8 C) 98.6 F (37 C)  SpO2: 97% 99%   Exam:  Constitutional:  . The patient is awake, alert, and oriented x 3. No acute distress. Respiratory:  . No increased work of breathing. . No wheezes, rales, or rhonchi . No tactile fremitus Cardiovascular:  . Regular rate and rhythm . No murmurs, ectopy, or gallups. . No lateral PMI. No thrills. Abdomen:  . Abdomen is soft  It is diffusely and mildly tender and slightly distended . No hernias, masses, or organomegaly . Normoactive bowel sounds.  Musculoskeletal:  . No cyanosis, clubbing, or edema Skin:  . No rashes, lesions, ulcers . palpation of skin: no induration or nodules Neurologic:  . CN 2-12 intact . Sensation all 4 extremities intact Psychiatric:  . Mental status o Mood, affect appropriate o Orientation to person, place, time  . judgment and insight appear intact  I have personally reviewed the following:   Today's  Data  . Vitals, BMP  Scheduled Meds: . bisacodyl  10 mg Rectal Once  . enoxaparin (LOVENOX) injection  40 mg Subcutaneous Q24H  . pantoprazole (PROTONIX) IV  40 mg Intravenous Q12H  . senna-docusate  1 tablet Oral BID   Continuous Infusions: . sodium chloride 50 mL (10/16/19 1430)  . cefTRIAXone (ROCEPHIN)  IV 2 g (10/16/19 1013)  . lactated ringers 100 mL/hr at 10/16/19 1329  . metronidazole 500 mg (10/16/19 1432)  . potassium chloride 10 mEq (10/16/19 1428)    Active Problems:   Duodenitis   LOS: 2 days   A & P  Mild acute pancreatitis and duodenitis: The patient has  been kept NPO. He states that his pain is improved and he is interested in a clear liquid diet. General surgery has ruled out appendicitis. CT abdomen demonstrated duodenitis and pancreatitis.Lipase is mildly elevated. The patient denies alcohol abuse. RUQ ultrasound was performed and demonstrates only hepatic steatosis.  He is receiving IV Rocephin and Flagyl, antiemetics, and pain control. Would avoid HCTZ in the future as may be a cause for pancreatitis. Will advance diet to full liquid.  Constipation: The patient states that he has not had a BM in several days. Will give docusate sodium and bisacodyl suppository.  Essential hypertension: Blood pressures have been well controlled for the past 24 hours without metoprolol or HCTZ. Will stop HCTZ as it may cause pancreatitis.  History of asthma: Noted and stable.   I have seen and examined this patient myself. I have spent 32 minutes in his evaluation and care.  DVT Prophylaxis: Subcutaneous Lovenox CODE STATUS:Full code  Family Communication: no family was present at bedside, at the time of interview.  Disposition: Admitted as inpatient, med-surge unit.  Jahan Friedlander, DO [facility] Direct contact: see [email or web address]  7PM-7AM contact night coverage as above 10/16/2019, 3:24 PM  LOS: 1 day

## 2019-10-17 DIAGNOSIS — R1013 Epigastric pain: Secondary | ICD-10-CM

## 2019-10-17 LAB — BASIC METABOLIC PANEL
Anion gap: 10 (ref 5–15)
BUN: <5 mg/dL — ABNORMAL LOW (ref 6–20)
CO2: 24 mmol/L (ref 22–32)
Calcium: 8.2 mg/dL — ABNORMAL LOW (ref 8.9–10.3)
Chloride: 103 mmol/L (ref 98–111)
Creatinine, Ser: 0.82 mg/dL (ref 0.61–1.24)
GFR calc Af Amer: >60 mL/min (ref >60)
GFR calc non Af Amer: >60 mL/min (ref >60)
Glucose, Bld: 134 mg/dL — ABNORMAL HIGH (ref 70–99)
Potassium: 3.3 mmol/L — ABNORMAL LOW (ref 3.5–5.1)
Sodium: 137 mmol/L (ref 135–145)

## 2019-10-17 MED ORDER — BISACODYL 10 MG RE SUPP: 10.0000 mg | Freq: Once | RECTAL | Status: DC

## 2019-10-17 MED ORDER — OXYCODONE HCL 5 MG PO TABS: 5.0000 mg | ORAL_TABLET | ORAL | 0 refills | Status: DC | PRN

## 2019-10-17 MED ORDER — LEVOFLOXACIN 750 MG PO TABS: 750.0000 mg | ORAL_TABLET | Freq: Every day | ORAL | 0 refills | Status: AC

## 2019-10-17 MED ORDER — DOCUSATE SODIUM 100 MG PO CAPS: 200.0000 mg | ORAL_CAPSULE | Freq: Once | ORAL | Status: DC

## 2019-10-17 NOTE — Discharge Summary (Signed)
Physician Discharge Summary  KEUNDRE BARELA [id number] DOB: Apr 19, 1976 DOA: 10/14/2019  PCP: Patient, No Pcp Per  Admit date: 10/14/2019 Discharge date: 10/17/2019  Recommendations for Outpatient Follow-up:  1. Follow up with PCP in 7-10 days. 2. You were cared for by a hospitalist during your hospital stay. If you have any questions about your discharge medications or the care you received while you were in the hospital after you are discharged, you can call the unit and asked to speak with the hospitalist on call if the hospitalist that took care of you is not available. Once you are discharged, your primary care physician will handle any further medical issues. Please note that NO REFILLS for any discharge medications will be authorized once you are discharged, as it is imperative that you return to your primary care physician (or establish a relationship with a primary care physician if you do not have one) for your aftercare needs so that they can reassess your need for medications and monitor your lab values. 3. Avoid Hydrochlorothiazide as it may have contributed to your pancreatitis.  Discharge Diagnoses: Principal diagnosis is #1 1. Pancreatitis/duodenitis  Discharge Condition: Fair  Disposition: Home  Diet recommendation: Heart healthy  Filed Weights   10/14/19 0736  Weight: 117 kg   History of present illness:  BREVAN DIMATTIA is a 43 y.o. male with Past medical history of asthma and hypertension. Patient was recently seen in the ER for concerns for abdominal pain and nausea. After being discharged from the ER patient continues to have abdominal discomfort. Last night the abdominal pain started more on the left side and radiated across his whole lower abdomen. This is associated with severe nausea as well as dry heaving. No obvious actual vomiting. Patient denies having any diarrhea but passing gas rarely. No chest pain or shortness of breath no cough. No  fever no chills. Patient reports no alcohol abuse no drug abuse. Mentions that he does not have any constipation at his baseline.  ED Course: CT abdomen shows evidence of inflammation in the duodenum as well as pancreas.  There is also concern for possible appendix involvement. EDP discussed with general surgery who felt that the patient does not have acute appendicitis and recommend medicine admission for duodenitis as well as pancreatitis.  Hospital Course:  The patient was admitted to a medical bed. He was given IV fluids, antibiotics, pain control, and antiemetics. His diet was slowly advanced from NPO to a soft diet. Today he is tolerating a soft diet well. He will be discharged to home in fair condition.  Today's assessment: S: The patient is sitting up in bed. He has no new complaints and wants to go home. O: Vitals:  Vitals:   10/16/19 2023 10/17/19 0451  BP: 127/80 130/89  Pulse: 86 86  Resp: 20 20  Temp: 99 F (37.2 C) 98.4 F (36.9 C)  SpO2: 99% 96%  Constitutional:   The patient is awake, alert, and oriented x 3. No acute distress. Respiratory:   No increased work of breathing.  No wheezes, rales, or rhonchi  No tactile fremitus Cardiovascular:   Regular rate and rhythm  No murmurs, ectopy, or gallups.  No lateral PMI. No thrills. Abdomen:   Abdomen is soft, non-tender, non-distended  No hernias, masses, or organomegaly  Normoactive bowel sounds.  Musculoskeletal:   No cyanosis, clubbing, or edema Skin:   No rashes, lesions, ulcers  palpation of skin: no induration or nodules Neurologic:   CN 2-12 intact  Sensation all 4 extremities intact Psychiatric:   Mental status ? Mood, affect appropriate ? Orientation to person, place, time   judgment and insight appear intact  Discharge Instructions  Discharge Instructions    Activity as tolerated - No restrictions   Complete by: As directed    Call MD for:  persistant nausea and vomiting    Complete by: As directed    Call MD for:  severe uncontrolled pain   Complete by: As directed    Call MD for:  temperature >100.4   Complete by: As directed    Diet - low sodium heart healthy   Complete by: As directed    Discharge instructions   Complete by: As directed    Follow up with PCP in 7-10 days.   Increase activity slowly   Complete by: As directed      Allergies as of 10/17/2019   No Known Allergies     Medication List    STOP taking these medications   cyclobenzaprine 5 MG tablet Commonly known as: FLEXERIL   hydrochlorothiazide 25 MG tablet Commonly known as: HYDRODIURIL   ibuprofen 600 MG tablet Commonly known as: ADVIL     TAKE these medications   levofloxacin 750 MG tablet Commonly known as: Levaquin Take 1 tablet (750 mg total) by mouth daily for 3 days.   metoprolol tartrate 25 MG tablet Commonly known as: LOPRESSOR Take 0.5 tablets (12.5 mg total) by mouth 2 (two) times daily.   oxyCODONE 5 MG immediate release tablet Commonly known as: Oxy IR/ROXICODONE Take 1 tablet (5 mg total) by mouth every 4 (four) hours as needed for moderate pain.      No Known Allergies  The results of significant diagnostics from this hospitalization (including imaging, microbiology, ancillary and laboratory) are listed below for reference.    Significant Diagnostic Studies: Ct Abdomen Pelvis W Contrast  Result Date: 10/14/2019 CLINICAL DATA:  Acute generalized abdominal pain, nausea, vomiting. EXAM: CT ABDOMEN AND PELVIS WITH CONTRAST TECHNIQUE: Multidetector CT imaging of the abdomen and pelvis was performed using the standard protocol following bolus administration of intravenous contrast. CONTRAST:  121mL OMNIPAQUE IOHEXOL 300 MG/ML  SOLN COMPARISON:  None. FINDINGS: Lower chest: No acute abnormality. Hepatobiliary: No gallstones or biliary dilatation is noted. Hepatic steatosis is noted. Pancreas: Unremarkable. No pancreatic ductal dilatation or surrounding  inflammatory changes. Spleen: Normal in size without focal abnormality. Adrenals/Urinary Tract: 1.8 cm left adrenal nodule is noted. Right adrenal gland is unremarkable. Left renal cyst is noted. No hydronephrosis or renal obstruction is noted. No renal or ureteral calculi are noted. Urinary bladder is unremarkable. Stomach/Bowel: Stomach is within normal limits. No abnormal bowel dilatation is noted. Most of the appendix appears to be air-filled and nondilated. However, there does appear to be focal wall thickening involving a midportion of the appendix with a large amount of inflammatory changes in the mesenteric region. Vascular/Lymphatic: No significant vascular findings are present. No enlarged abdominal or pelvic lymph nodes. Reproductive: Prostate is unremarkable. Other: No hernia is noted. As noted above, large amount of inflammatory stranding is seen involving the central mesenteric region which surrounds a portion of the appendix. Musculoskeletal: No acute or significant osseous findings. IMPRESSION: Inflammatory stranding is noted in the mesenteric region as well as retroperitoneal region which extends to the right lower quadrant; etiology is uncertain, but may be secondary to duodenal inflammation or possibly pancreatitis. The appendix is nondilated and predominantly air-filled, except for a segment in its midportion which demonstrates a thickened  wall and is surrounded by the inflammatory changes previously described. It is felt that this most likely is secondary inflammation due to the previously described inflammatory changes, but acute appendicitis cannot be excluded but felt to be unlikely. Clinical correlation is recommended. These results were called by telephone at the time of interpretation on 10/14/2019 at 10:01 am to provider [facility] , who verbally acknowledged these results. Hepatic steatosis. Electronically Signed   By: Marijo Conception M.D.   On: 10/14/2019 10:02   US Abdomen  Limited Ruq  Result Date: 10/14/2019 CLINICAL DATA:  Abdominal pain and nausea EXAM: ULTRASOUND ABDOMEN LIMITED RIGHT UPPER QUADRANT COMPARISON:  CT abdomen and pelvis October 14, 2019 FINDINGS: Gallbladder: No gallstones or wall thickening visualized. There is no pericholecystic fluid. No sonographic Murphy sign noted by sonographer. Common bile duct: Diameter: 4 mm. No intrahepatic or extrahepatic biliary duct dilatation. Liver: No focal lesion identified. Liver echogenicity is increased diffusely. Portal vein is patent on color Doppler imaging with normal direction of blood flow towards the liver. Other: None. IMPRESSION: Diffuse increase in liver echogenicity, a finding indicative of hepatic steatosis. While no focal liver lesions are evident on this study, it must be cautioned that the sensitivity of ultrasound for detection of focal liver lesions is diminished significantly in this circumstance. Study otherwise unremarkable. Electronically Signed   By: Lowella Grip III M.D.   On: 10/14/2019 12:45    Microbiology: Recent Results (from the past 240 hour(s))  SARS CORONAVIRUS 2 (TAT 6-24 HRS) Nasopharyngeal Nasopharyngeal Swab     Status: None   Collection Time: 10/10/19  7:14 AM   Specimen: Nasopharyngeal Swab  Result Value Ref Range Status   SARS Coronavirus 2 NEGATIVE NEGATIVE Final    Comment: (NOTE) SARS-CoV-2 target nucleic acids are NOT DETECTED. The SARS-CoV-2 RNA is generally detectable in upper and lower respiratory specimens during the acute phase of infection. Negative results do not preclude SARS-CoV-2 infection, do not rule out co-infections with other pathogens, and should not be used as the sole basis for treatment or other patient management decisions. Negative results must be combined with clinical observations, patient history, and epidemiological information. The expected result is Negative. Fact Sheet for Patients: SugarRoll.be Fact  Sheet for Healthcare Providers: https://www.woods-mathews.com/ This test is not yet approved or cleared by the [place] FDA and  has been authorized for detection and/or diagnosis of SARS-CoV-2 by FDA under an Emergency Use Authorization (EUA). This EUA will remain  in effect (meaning this test can be used) for the duration of the COVID-19 declaration under Section 56 4(b)(1) of the Act, 21 U.S.C. section 360bbb-3(b)(1), unless the authorization is terminated or revoked sooner. Performed at [facility], [place] [address], [place], [place] [address]   SARS CORONAVIRUS 2 (TAT 6-24 HRS) Nasopharyngeal Nasopharyngeal Swab     Status: None   Collection Time: 10/14/19 10:51 AM   Specimen: Nasopharyngeal Swab  Result Value Ref Range Status   SARS Coronavirus 2 NEGATIVE NEGATIVE Final    Comment: (NOTE) SARS-CoV-2 target nucleic acids are NOT DETECTED. The SARS-CoV-2 RNA is generally detectable in upper and lower respiratory specimens during the acute phase of infection. Negative results do not preclude SARS-CoV-2 infection, do not rule out co-infections with other pathogens, and should not be used as the sole basis for treatment or other patient management decisions. Negative results must be combined with clinical observations, patient history, and epidemiological information. The expected result is Negative. Fact Sheet for Patients: SugarRoll.be Fact Sheet  for Healthcare Providers: https://www.woods-mathews.com/ This test is not yet approved or cleared by the [place] and  has been authorized for detection and/or diagnosis of SARS-CoV-2 by FDA under an Emergency Use Authorization (EUA). This EUA will remain  in effect (meaning this test can be used) for the duration of the COVID-19 declaration under Section 56 4(b)(1) of the Act, 21 U.S.C. section 360bbb-3(b)(1), unless the authorization is terminated or revoked  sooner. Performed at [facility], [place] [address] [place], [place] [address]      Labs: Basic Metabolic Panel: Recent Labs  Lab 10/14/19 0739 10/15/19 0603 10/15/19 1344 10/16/19 0620 10/17/19 0639  NA 133* 134* 134* 137 137  K 3.0* 2.7* 3.0* 3.1* 3.3*  CL 92* 96* 100 101 103  CO2 26 25 24 25 24   GLUCOSE 185* 141* 176* 134* 134*  BUN 13 8 7  <5* <5*  CREATININE 0.98 0.78 0.76 0.86 0.82  CALCIUM 9.2 8.3* 8.2* 8.2* 8.2*  MG  --  1.6*  --  2.0  --    Liver Function Tests: Recent Labs  Lab 10/14/19 0739 10/15/19 0603  AST 28 18  ALT 30 19  ALKPHOS 73 60  BILITOT 1.0 0.9  PROT 7.5 5.9*  ALBUMIN 4.4 3.2*   Recent Labs  Lab 10/14/19 0739 10/15/19 0603  LIPASE 59* 33   No results for input(s): AMMONIA in the last 168 hours. CBC: Recent Labs  Lab 10/14/19 0739 10/15/19 0603 10/16/19 0620  WBC 16.5* 11.9* 9.4  HGB 13.6 11.3* 10.9*  HCT 39.8 33.2* 32.2*  MCV 86.7 85.8 85.9  PLT 227 177 181   Cardiac Enzymes: No results for input(s): CKTOTAL, CKMB, CKMBINDEX, TROPONINI in the last 168 hours. BNP: BNP (last 3 results) No results for input(s): BNP in the last 8760 hours.  ProBNP (last 3 results) No results for input(s): PROBNP in the last 8760 hours.  CBG: No results for input(s): GLUCAP in the last 168 hours.  Active Problems:   Duodenitis Pancreatitis  Time coordinating discharge: 38 minutes.  Signed:        Lovell Nuttall, DO [facility]  10/17/2019, 11:45 AM

## 2019-10-17 NOTE — Care Management (Signed)
Patient was not seen as he had already discharged to home before [facility] could see today.

## 2019-10-17 NOTE — Progress Notes (Signed)
MD ordered patient to be discharged home.  Discharge instructions were reviewed with the patient and he voiced understanding.  Follow-up appointment was made.  Prescriptions given to the patient.  IV was removed with catheter intact.  All patients questions were answered.  Patient left via wheelchair escorted by NT.  

## 2019-12-26 ENCOUNTER — Encounter: Payer: Self-pay | Admitting: Emergency Medicine

## 2019-12-26 ENCOUNTER — Emergency Department
Admission: EM | Admit: 2019-12-26 | Discharge: 2019-12-26 | Disposition: A | Discharge status: Home / Self Care | Payer: Self-pay | Attending: Emergency Medicine | Admitting: Emergency Medicine

## 2019-12-26 ENCOUNTER — Emergency Department: Payer: Self-pay

## 2019-12-26 ENCOUNTER — Other Ambulatory Visit: Payer: Self-pay

## 2019-12-26 DIAGNOSIS — Y999 Unspecified external cause status: Secondary | ICD-10-CM | POA: Insufficient documentation

## 2019-12-26 DIAGNOSIS — Z79899 Other long term (current) drug therapy: Secondary | ICD-10-CM | POA: Insufficient documentation

## 2019-12-26 DIAGNOSIS — I10 Essential (primary) hypertension: Secondary | ICD-10-CM | POA: Insufficient documentation

## 2019-12-26 DIAGNOSIS — J45909 Unspecified asthma, uncomplicated: Secondary | ICD-10-CM | POA: Insufficient documentation

## 2019-12-26 DIAGNOSIS — W010XXA Fall on same level from slipping, tripping and stumbling without subsequent striking against object, initial encounter: Secondary | ICD-10-CM | POA: Insufficient documentation

## 2019-12-26 DIAGNOSIS — F17210 Nicotine dependence, cigarettes, uncomplicated: Secondary | ICD-10-CM | POA: Insufficient documentation

## 2019-12-26 DIAGNOSIS — S39012A Strain of muscle, fascia and tendon of lower back, initial encounter: Secondary | ICD-10-CM | POA: Insufficient documentation

## 2019-12-26 DIAGNOSIS — Y929 Unspecified place or not applicable: Secondary | ICD-10-CM | POA: Insufficient documentation

## 2019-12-26 DIAGNOSIS — Y939 Activity, unspecified: Secondary | ICD-10-CM | POA: Insufficient documentation

## 2019-12-26 DIAGNOSIS — S63634A Sprain of interphalangeal joint of right ring finger, initial encounter: Secondary | ICD-10-CM | POA: Insufficient documentation

## 2019-12-26 DIAGNOSIS — M62830 Muscle spasm of back: Secondary | ICD-10-CM | POA: Insufficient documentation

## 2019-12-26 MED ORDER — CYCLOBENZAPRINE HCL 10 MG PO TABS
10.0000 mg | ORAL_TABLET | Freq: Once | ORAL | Status: AC
  Administered 2019-12-26: 16:00:00 10 mg via ORAL
  Filled 2019-12-26: qty 1

## 2019-12-26 MED ORDER — CYCLOBENZAPRINE HCL 5 MG PO TABS: 5.0000 mg | ORAL_TABLET | Freq: Three times a day (TID) | ORAL | 0 refills | Status: DC | PRN

## 2019-12-26 NOTE — ED Triage Notes (Signed)
Pt arrived via POV with report of low back pain and right ring and middle finger pain after a fall on Wed or Thurs. Pt states he tripped and tried to catch himself with right hand.  Swelling noted to fingers.

## 2019-12-26 NOTE — Discharge Instructions (Signed)
Please take Tylenol and ibuprofen as needed for pain.  Use Flexeril as needed for muscle spasms.  Keep middle and ring fingers buddy taped on the right hand for 2 weeks.  If no improvement 2 weeks follow-up with orthopedics.  Return to the ER for any increasing pain, fevers, abdominal pain, weakness, worsening symptoms or urgent changes in your health.

## 2019-12-26 NOTE — ED Provider Notes (Signed)
[facility] EMERGENCY DEPARTMENT Provider Note   CSN: [id number] Arrival date & time: 12/26/19  1440     History Chief Complaint  Patient presents with  . Back Pain  . Hand Pain    Logan Lucas is a 44 y.o. male presents to the emergency department for evaluation of right ring finger pain and right mid and lower back pain.  Patient had a fall 2 days ago where he fell backwards from a standing position, did not hit his head or lose consciousness.  Denies any significant pain to his back at this time but states he is developed some spasms and tightness there about his mid and lower back on the right side.  He denies any upper extremity or lower extremity discomfort.  His right ring finger was jammed into the ground when he fell, has some swelling along the PIP and DIP joint with stiffness and pain.  He has tried some over-the-counter medications with little relief.  He works at [employer] and is right-hand dominant.  HPI     Past Medical History:  Diagnosis Date  . Asthma   . Hypertension     Patient Active Problem List   Diagnosis Date Noted  . Duodenitis 10/14/2019  . Chest pain 06/17/2018    Past Surgical History:  Procedure Laterality Date  . none         Family History  Problem Relation Age of Onset  . Diabetes Mother   . Cancer Father     Social History   Tobacco Use  . Smoking status: Current Every Day Smoker    Packs/day: 1.00    Years: 5.00    Pack years: 5.00    Types: Cigarettes  . Smokeless tobacco: Never Used  Substance Use Topics  . Alcohol use: Yes    Alcohol/week: 7.0 standard drinks    Types: 2 Cans of beer, 5 Shots of liquor per week  . Drug use: No    Frequency: 1.0 times per week    Home Medications Prior to Admission medications   Medication Sig Start Date End Date Taking? Authorizing Provider  cyclobenzaprine (FLEXERIL) 5 MG tablet Take 1-2 tablets (5-10 mg total) by mouth 3 (three) times daily as  needed for muscle spasms. 12/26/19   Duanne Guess, PA-C  metoprolol tartrate (LOPRESSOR) 25 MG tablet Take 0.5 tablets (12.5 mg total) by mouth 2 (two) times daily. 07/06/19 10/14/19  Johnn Hai, PA-C  oxyCODONE (OXY IR/ROXICODONE) 5 MG immediate release tablet Take 1 tablet (5 mg total) by mouth every 4 (four) hours as needed for moderate pain. 10/17/19   Swayze, Ava, DO    Allergies    Patient has no known allergies.  Review of Systems   Review of Systems  Constitutional: Positive for fever.  Eyes: Negative for visual disturbance.  Respiratory: Negative for shortness of breath.   Cardiovascular: Negative for chest pain.  Gastrointestinal: Negative for nausea and vomiting.  Musculoskeletal: Positive for arthralgias, back pain, joint swelling and myalgias. Negative for gait problem, neck pain and neck stiffness.  Skin: Negative for rash.  Neurological: Negative for dizziness, numbness and headaches.    Physical Exam Updated Vital Signs BP (!) 147/93 (BP Location: Left Arm)   Pulse 89   Temp 98.4 F (36.9 C) (Oral)   Resp 18   Ht 5\' 9"  (1.753 m)   Wt 122.5 kg   SpO2 99%   BMI 39.87 kg/m   Physical Exam Constitutional:  Appearance: He is well-developed.  HENT:     Head: Normocephalic and atraumatic.  Eyes:     Conjunctiva/sclera: Conjunctivae normal.  Cardiovascular:     Rate and Rhythm: Normal rate.  Pulmonary:     Effort: Pulmonary effort is normal. No respiratory distress.  Abdominal:     General: There is no distension.     Tenderness: There is no abdominal tenderness. There is no guarding.  Musculoskeletal:        General: Normal range of motion.     Cervical back: Normal range of motion.     Comments: Lumbar and thoracic spine shows no spinous process tenderness.  Mild right lower lumbar paravertebral muscle tenderness.  No sacral tenderness.  Good range of motion with flexion extension with no discomfort.  Full range of motion of the hips with no  discomfort.  Ambulates well with no antalgic gait.  Right ring finger with mild swelling along the PIP and DIP joint with mild tenderness palpation.  No deformity.  Full active extension and slight loss of flexion, -2 cm full composite fist.  Skin:    General: Skin is warm.     Findings: No rash.  Neurological:     General: No focal deficit present.     Mental Status: He is alert and oriented to person, place, and time.  Psychiatric:        Behavior: Behavior normal.        Thought Content: Thought content normal.     ED Results / Procedures / Treatments   Labs (all labs ordered are listed, but only abnormal results are displayed) Labs Reviewed - No data to display  EKG None  Radiology DG Finger Ring Right  Result Date: 12/26/2019 CLINICAL DATA:  Pain and swelling after fall. EXAM: RIGHT RING FINGER 2+V COMPARISON:  None. FINDINGS: There is no evidence of fracture or dislocation. There is no evidence of arthropathy or other focal bone abnormality. Soft tissues are unremarkable. IMPRESSION: Negative. Electronically Signed   By: Dorise Bullion III M.D   On: 12/26/2019 16:03    Procedures Procedures (including critical care time)  Medications Ordered in ED Medications  cyclobenzaprine (FLEXERIL) tablet 10 mg (10 mg Oral Given 12/26/19 1606)    ED Course  I have reviewed the triage vital signs and the nursing notes.  Pertinent labs & imaging results that were available during my care of the patient were reviewed by me and considered in my medical decision making (see chart for details).    MDM Rules/Calculators/A&P                      44 year old male with fall 2 days ago.  Having muscle spasms lower back with no spinous process tenderness.  Vital signs are stable, afebrile.  No neurological deficits.  Exam consistent with lumbar muscle spasms.  His right ring finger with mild swelling along the PIP and DIP joint, x-ray showed no evidence of acute bony abnormality.  He was  diagnosed with PIP and DIP sprain, buddy tape applied and he will work on gentle range of motion.  Apply ice.  Take Tylenol and ibuprofen.  Follow-up with orthopedics if continued pain in 2 weeks.  Return to the ER for any worsening symptoms or urgent changes in health. Final Clinical Impression(s) / ED Diagnoses Final diagnoses:  Sprain of interphalangeal joint of right ring finger, initial encounter  Lumbar strain, initial encounter  Lumbar paraspinal muscle spasm    Rx /  DC Orders ED Discharge Orders         Ordered    cyclobenzaprine (FLEXERIL) 5 MG tablet  3 times daily PRN     12/26/19 1612           Renata Caprice 12/26/19 1628    Harvest Dark, MD 12/26/19 2050

## 2019-12-26 NOTE — ED Triage Notes (Signed)
First RN Note: Pt presents to ED with c/o pain to ring and middle finger of R hand and lower back pain s/p fall. Pt states fell and tried to catch himself with R hand. Pt c/o pain and swelling to finger and pain to back. Pt ambulatory without difficulty at this time.

## 2020-05-07 ENCOUNTER — Encounter: Payer: Self-pay | Admitting: Emergency Medicine

## 2020-05-07 ENCOUNTER — Emergency Department
Admission: EM | Admit: 2020-05-07 | Discharge: 2020-05-07 | Disposition: A | Discharge status: Home / Self Care | Payer: Self-pay | Attending: Emergency Medicine | Admitting: Emergency Medicine

## 2020-05-07 ENCOUNTER — Emergency Department: Payer: Self-pay

## 2020-05-07 ENCOUNTER — Other Ambulatory Visit: Payer: Self-pay

## 2020-05-07 DIAGNOSIS — K921 Melena: Secondary | ICD-10-CM | POA: Insufficient documentation

## 2020-05-07 DIAGNOSIS — F17210 Nicotine dependence, cigarettes, uncomplicated: Secondary | ICD-10-CM | POA: Insufficient documentation

## 2020-05-07 DIAGNOSIS — R1084 Generalized abdominal pain: Secondary | ICD-10-CM | POA: Insufficient documentation

## 2020-05-07 DIAGNOSIS — I10 Essential (primary) hypertension: Secondary | ICD-10-CM | POA: Insufficient documentation

## 2020-05-07 DIAGNOSIS — J45909 Unspecified asthma, uncomplicated: Secondary | ICD-10-CM | POA: Insufficient documentation

## 2020-05-07 LAB — LIPASE, BLOOD: Lipase: 45 U/L (ref 11–51)

## 2020-05-07 LAB — COMPREHENSIVE METABOLIC PANEL
ALT: 25 U/L (ref 0–44)
AST: 26 U/L (ref 15–41)
Albumin: 4.1 g/dL (ref 3.5–5.0)
Alkaline Phosphatase: 93 U/L (ref 38–126)
Anion gap: 10 (ref 5–15)
BUN: 12 mg/dL (ref 6–20)
CO2: 24 mmol/L (ref 22–32)
Calcium: 9.3 mg/dL (ref 8.9–10.3)
Chloride: 102 mmol/L (ref 98–111)
Creatinine, Ser: 1.24 mg/dL (ref 0.61–1.24)
GFR calc Af Amer: >60 mL/min (ref >60)
GFR calc non Af Amer: >60 mL/min (ref >60)
Glucose, Bld: 179 mg/dL — ABNORMAL HIGH (ref 70–99)
Potassium: 3.4 mmol/L — ABNORMAL LOW (ref 3.5–5.1)
Sodium: 136 mmol/L (ref 135–145)
Total Bilirubin: 0.8 mg/dL (ref 0.3–1.2)
Total Protein: 7.3 g/dL (ref 6.5–8.1)

## 2020-05-07 LAB — URINALYSIS, COMPLETE (UACMP) WITH MICROSCOPIC
Bacteria, UA: NONE SEEN
Bilirubin Urine: NEGATIVE
Glucose, UA: NEGATIVE mg/dL
Hgb urine dipstick: NEGATIVE
Ketones, ur: NEGATIVE mg/dL
Leukocytes,Ua: NEGATIVE
Nitrite: NEGATIVE
Protein, ur: NEGATIVE mg/dL
Specific Gravity, Urine: 1.021 (ref 1.005–1.030)
pH: 5 (ref 5.0–8.0)

## 2020-05-07 LAB — CBC
HCT: 42.2 % (ref 39.0–52.0)
Hemoglobin: 13.9 g/dL (ref 13.0–17.0)
MCH: 28.8 pg (ref 26.0–34.0)
MCHC: 32.9 g/dL (ref 30.0–36.0)
MCV: 87.4 fL (ref 80.0–100.0)
Platelets: 268 10*3/uL (ref 150–400)
RBC: 4.83 MIL/uL (ref 4.22–5.81)
RDW: 13.6 % (ref 11.5–15.5)
WBC: 8.1 10*3/uL (ref 4.0–10.5)
nRBC: 0 % (ref 0.0–0.2)

## 2020-05-07 MED ORDER — SODIUM CHLORIDE 0.9% FLUSH: 3.0000 mL | Freq: Once | INTRAVENOUS | Status: DC

## 2020-05-07 MED ORDER — SUMATRIPTAN SUCCINATE 50 MG PO TABS
25.0000 mg | ORAL_TABLET | Freq: Once | ORAL | Status: AC
  Administered 2020-05-07: 25 mg via ORAL
  Filled 2020-05-07 (×2): qty 1

## 2020-05-07 MED ORDER — OMEPRAZOLE 10 MG PO CPDR: 10.0000 mg | DELAYED_RELEASE_CAPSULE | Freq: Every day | ORAL | 0 refills | Status: DC

## 2020-05-07 MED ORDER — IOHEXOL 300 MG/ML  SOLN
125.0000 mL | Freq: Once | INTRAMUSCULAR | Status: AC | PRN
  Administered 2020-05-07: 125 mL via INTRAVENOUS

## 2020-05-07 MED ORDER — SUMATRIPTAN SUCCINATE 50 MG PO TABS: 50.0000 mg | ORAL_TABLET | Freq: Once | ORAL | 2 refills | Status: DC | PRN

## 2020-05-07 MED ORDER — SODIUM CHLORIDE 0.9 % IV BOLUS
1000.0000 mL | Freq: Once | INTRAVENOUS | Status: AC
  Administered 2020-05-07: 1000 mL via INTRAVENOUS

## 2020-05-07 MED ORDER — ACETAMINOPHEN 500 MG PO TABS
1000.0000 mg | ORAL_TABLET | Freq: Once | ORAL | Status: AC
  Administered 2020-05-07: 1000 mg via ORAL
  Filled 2020-05-07: qty 2

## 2020-05-07 MED ORDER — IOHEXOL 9 MG/ML PO SOLN
1000.0000 mL | Freq: Once | ORAL | Status: DC | PRN
  Administered 2020-05-07: 1000 mL via ORAL

## 2020-05-07 MED ORDER — DICYCLOMINE HCL 10 MG PO CAPS: 10.0000 mg | ORAL_CAPSULE | Freq: Four times a day (QID) | ORAL | 0 refills | Status: DC

## 2020-05-07 NOTE — ED Notes (Signed)
Pt verbalized understanding of discharge instructions. NAD at this time. 

## 2020-05-07 NOTE — ED Notes (Signed)
Discharge pending due to medication being unavailable. This RN messaged pharmacy to request Imitrex.

## 2020-05-07 NOTE — ED Triage Notes (Signed)
Pt presents to ED via POV with c/o lower abdominal pain x several weeks, pt states when he bends over pain radiates up into his chest. Pt states yesterday noted several blood stools. Pt presents A&O x4, ambulatory with steady gait, drinking soda in triage.   Pt also c/o intermittent HA and blurry vision.

## 2020-05-07 NOTE — ED Provider Notes (Signed)
[facility] Emergency Department Provider Note  ____________________________________________  Time seen: Approximately 12:07 PM  I have reviewed the triage vital signs and the nursing notes.   HISTORY  Chief Complaint Abdominal Pain    HPI Logan Lucas is a 44 y.o. male who presents the emergency department complaining of diffuse abdominal pain that has been worsening over the past month.  Patient states that he has had generalized abdominal pain, slightly worsened in the bilateral lower quadrants the upper portion of his abdomen x1 month.  This is progressively worsening.  Patient states that he has now visualized blood in his stools as well.  He states that there are some darker clots but also some bright red blood.  He has no rectal pain associated with bleeding.  No history of hemorrhoids.  Patient states that he had a similar episode last year and was diagnosed with duodenitis.  Patient states that he may have had some chills but did not believe that he has had any fevers.  Patient denies any emesis but has had some nausea.  No diarrhea or constipation.  Patient has not taken any medications for this complaint.  He has a history of asthma, hypertension and an episode of duodenitis last year.         Past Medical History:  Diagnosis Date  . Asthma   . Hypertension     Patient Active Problem List   Diagnosis Date Noted  . Duodenitis 10/14/2019  . Chest pain 06/17/2018    Past Surgical History:  Procedure Laterality Date  . none      Prior to Admission medications   Medication Sig Start Date End Date Taking? Authorizing Provider  cyclobenzaprine (FLEXERIL) 5 MG tablet Take 1-2 tablets (5-10 mg total) by mouth 3 (three) times daily as needed for muscle spasms. 12/26/19   Duanne Guess, PA-C  dicyclomine (BENTYL) 10 MG capsule Take 1 capsule (10 mg total) by mouth 4 (four) times daily for 14 days. 05/07/20 05/21/20  Rodman Recupero, Charline Bills, PA-C   metoprolol tartrate (LOPRESSOR) 25 MG tablet Take 0.5 tablets (12.5 mg total) by mouth 2 (two) times daily. 07/06/19 10/14/19  Johnn Hai, PA-C  omeprazole (PRILOSEC) 10 MG capsule Take 1 capsule (10 mg total) by mouth daily. 05/07/20   Charnise Lovan, Charline Bills, PA-C  oxyCODONE (OXY IR/ROXICODONE) 5 MG immediate release tablet Take 1 tablet (5 mg total) by mouth every 4 (four) hours as needed for moderate pain. 10/17/19   Swayze, Ava, DO    Allergies Patient has no known allergies.  Family History  Problem Relation Age of Onset  . Diabetes Mother   . Cancer Father     Social History Social History   Tobacco Use  . Smoking status: Current Every Day Smoker    Packs/day: 1.00    Years: 5.00    Pack years: 5.00    Types: Cigarettes  . Smokeless tobacco: Never Used  Vaping Use  . Vaping Use: Never used  Substance Use Topics  . Alcohol use: Yes    Alcohol/week: 7.0 standard drinks    Types: 2 Cans of beer, 5 Shots of liquor per week  . Drug use: No    Frequency: 1.0 times per week     Review of Systems  Constitutional: No fever/chills Eyes: No visual changes. No discharge ENT: No upper respiratory complaints. Cardiovascular: no chest pain. Respiratory: no cough. No SOB. Gastrointestinal: Diffuse abdominal pain worse in the lower quadrants.  Positive for blood  in the stools..  No nausea, no vomiting.  No diarrhea.  No constipation. Genitourinary: Negative for dysuria. No hematuria Musculoskeletal: Negative for musculoskeletal pain. Skin: Negative for rash, abrasions, lacerations, ecchymosis. Neurological: Negative for headaches, focal weakness or numbness. 10-point ROS otherwise negative.  ____________________________________________   PHYSICAL EXAM:  VITAL SIGNS: ED Triage Vitals  Enc Vitals Group     BP 05/07/20 0956 (!) 158/88     Pulse Rate 05/07/20 0956 85     Resp 05/07/20 0956 20     Temp 05/07/20 0956 99.2 F (37.3 C)     Temp Source 05/07/20 0956  Oral     SpO2 05/07/20 0956 96 %     Weight 05/07/20 0957 260 lb (117.9 kg)     Height 05/07/20 0957 5\' 9"  (1.753 m)     Head Circumference --      Peak Flow --      Pain Score 05/07/20 0957 9     Pain Loc --      Pain Edu? --      Excl. in [place]? --      Constitutional: Alert and oriented. Well appearing and in no acute distress. Eyes: Conjunctivae are normal. PERRL. EOMI. Head: Atraumatic. ENT:      Ears:       Nose: No congestion/rhinnorhea.      Mouth/Throat: Mucous membranes are moist.  Neck: No stridor.    Cardiovascular: Normal rate, regular rhythm. Normal S1 and S2.  Good peripheral circulation. Respiratory: Normal respiratory effort without tachypnea or retractions. Lungs CTAB. Good air entry to the bases with no decreased or absent breath sounds. Gastrointestinal: Bowel sounds 4 quadrants.  Soft to palpation all quadrants.  Patient is diffusely tender to palpation, worse tenderness in the bilateral lower quadrants.  Mild guarding in the bilateral lower quadrants.  No rebound tenderness..  No rigidity.  No palpable masses. No distention. No CVA tenderness. Musculoskeletal: Full range of motion to all extremities. No gross deformities appreciated. Neurologic:  Normal speech and language. No gross focal neurologic deficits are appreciated.  Skin:  Skin is warm, dry and intact. No rash noted. Psychiatric: Mood and affect are normal. Speech and behavior are normal. Patient exhibits appropriate insight and judgement.   ____________________________________________   LABS (all labs ordered are listed, but only abnormal results are displayed)  Labs Reviewed  COMPREHENSIVE METABOLIC PANEL - Abnormal; Notable for the following components:      Result Value   Potassium 3.4 (*)    Glucose, Bld 179 (*)    All other components within normal limits  URINALYSIS, COMPLETE (UACMP) WITH MICROSCOPIC - Abnormal; Notable for the following components:   Color, Urine YELLOW (*)     APPearance CLEAR (*)    All other components within normal limits  LIPASE, BLOOD  CBC   ____________________________________________  EKG   ____________________________________________  RADIOLOGY I personally viewed and evaluated these images as part of my medical decision making, as well as reviewing the written report by the radiologist.  CT ABDOMEN PELVIS W CONTRAST  Result Date: 05/07/2020 CLINICAL DATA:  Worsening abdominal pain over the past month. History of duodenitis. Melena. EXAM: CT ABDOMEN AND PELVIS WITH CONTRAST TECHNIQUE: Multidetector CT imaging of the abdomen and pelvis was performed using the standard protocol following bolus administration of intravenous contrast. CONTRAST:  131mL OMNIPAQUE IOHEXOL 300 MG/ML  SOLN COMPARISON:  10/14/2019 FINDINGS: Lower chest:  No contributory findings. Hepatobiliary: Marked hepatic steatosis.No evidence of biliary obstruction or stone. Pancreas: Unremarkable. Spleen:  Unremarkable. Adrenals/Urinary Tract: 15 mm left adrenal nodule that is an adenoma given relative stability from 2020. There also 2 right adrenal nodules measuring up to 13 mm that are stable. 16 mm left upper pole renal cyst. No hydronephrosis. Negative bladder. Stomach/Bowel: No obstruction. No visible inflammation, including at the stomach and duodenum. Negative appendix. There are a few left colonic diverticula. Vascular/Lymphatic: No acute vascular abnormality. No mass or adenopathy. Reproductive:No pathologic findings. Other: No ascites or pneumoperitoneum. Musculoskeletal: No acute abnormalities. IMPRESSION: 1. No acute finding. 2. Hepatic steatosis and bilateral adrenal adenomas. Electronically Signed   By: Monte Fantasia M.D.   On: 05/07/2020 13:58    ____________________________________________    PROCEDURES  Procedure(s) performed:    Procedures    Medications  sodium chloride flush (NS) 0.9 % injection 3 mL (has no administration in time range)  iohexol  (OMNIPAQUE) 9 MG/ML oral solution 1,000 mL (1,000 mLs Oral Contrast Given 05/07/20 1235)  sodium chloride 0.9 % bolus 1,000 mL (1,000 mLs Intravenous New Bag/Given 05/07/20 1249)  acetaminophen (TYLENOL) tablet 1,000 mg (1,000 mg Oral Given 05/07/20 1259)  iohexol (OMNIPAQUE) 300 MG/ML solution 125 mL (125 mLs Intravenous Contrast Given 05/07/20 1316)     ____________________________________________   INITIAL IMPRESSION / ASSESSMENT AND PLAN / ED COURSE  Pertinent labs & imaging results that were available during my care of the patient were reviewed by me and considered in my medical decision making (see chart for details).  Review of the [place] CSRS was performed in accordance of the [place] prior to dispensing any controlled drugs.           Patient's diagnosis is consistent with abdominal pain of melena.  Patient presented to the emergency department complaining of diffuse abdominal pain x1 month as well as some blood in his stools yesterday.  Patient had a similar episode last year was diagnosed with duodenitis.  Patient had been admitted to the hospital service for antibiotics at that time.  Patient states that symptoms were similar so he presented to emergency department.  Overall exam was reassuring.  Given the patient's symptoms I did pursue CT scan.  No acute findings on CT.  Labs are stable.  At this time I have referred the patient to GI for further work-up.  Differential includes Crohn's, colitis, irritable bowel syndrome, celiac's.  I will prescribe patient omeprazole and Bentyl for symptom control..  Follow-up with GI.  Patient is given ED precautions to return to the ED for any worsening or new symptoms.     ____________________________________________  FINAL CLINICAL IMPRESSION(S) / ED DIAGNOSES  Final diagnoses:  Generalized abdominal pain  Melena      NEW MEDICATIONS STARTED DURING THIS VISIT:  ED Discharge Orders         Ordered    dicyclomine (BENTYL) 10 MG capsule   4 times daily     Discontinue     05/07/20 1524    omeprazole (PRILOSEC) 10 MG capsule  Daily     Discontinue     05/07/20 1524              This chart was dictated using voice recognition software/Dragon. Despite best efforts to proofread, errors can occur which can change the meaning. Any change was purely unintentional.    Darletta Moll, PA-C 05/07/20 1526    Arta Silence, MD 05/07/20 1541

## 2020-05-07 NOTE — ED Notes (Signed)
Pt visualized in NAD, stretched out across several chairs in the lobby. Pt awakens easily upon this RN arrival to re-assess VS. Rounding performed by this RN.

## 2020-05-24 ENCOUNTER — Telehealth: Payer: Self-pay | Admitting: General Practice

## 2020-05-24 NOTE — Telephone Encounter (Signed)
Individual has been contacted 3+ times regarding ED referral. Information regarding how to become a pt in the clinic has been provided. Further attempts to contact individual will not be made.

## 2020-06-14 ENCOUNTER — Encounter: Payer: Self-pay | Admitting: Emergency Medicine

## 2020-06-14 ENCOUNTER — Emergency Department: Payer: Self-pay

## 2020-06-14 ENCOUNTER — Other Ambulatory Visit: Payer: Self-pay

## 2020-06-14 DIAGNOSIS — R42 Dizziness and giddiness: Secondary | ICD-10-CM | POA: Insufficient documentation

## 2020-06-14 DIAGNOSIS — M79602 Pain in left arm: Secondary | ICD-10-CM | POA: Insufficient documentation

## 2020-06-14 DIAGNOSIS — R0789 Other chest pain: Secondary | ICD-10-CM | POA: Insufficient documentation

## 2020-06-14 DIAGNOSIS — Z5321 Procedure and treatment not carried out due to patient leaving prior to being seen by health care provider: Secondary | ICD-10-CM | POA: Insufficient documentation

## 2020-06-14 DIAGNOSIS — I10 Essential (primary) hypertension: Secondary | ICD-10-CM | POA: Insufficient documentation

## 2020-06-14 DIAGNOSIS — R531 Weakness: Secondary | ICD-10-CM | POA: Insufficient documentation

## 2020-06-14 LAB — COMPREHENSIVE METABOLIC PANEL
ALT: 34 U/L (ref 0–44)
AST: 28 U/L (ref 15–41)
Albumin: 4.1 g/dL (ref 3.5–5.0)
Alkaline Phosphatase: 85 U/L (ref 38–126)
Anion gap: 12 (ref 5–15)
BUN: 13 mg/dL (ref 6–20)
CO2: 21 mmol/L — ABNORMAL LOW (ref 22–32)
Calcium: 8.7 mg/dL — ABNORMAL LOW (ref 8.9–10.3)
Chloride: 105 mmol/L (ref 98–111)
Creatinine, Ser: 1.14 mg/dL (ref 0.61–1.24)
GFR calc Af Amer: >60 mL/min (ref >60)
GFR calc non Af Amer: >60 mL/min (ref >60)
Glucose, Bld: 179 mg/dL — ABNORMAL HIGH (ref 70–99)
Potassium: 3.2 mmol/L — ABNORMAL LOW (ref 3.5–5.1)
Sodium: 138 mmol/L (ref 135–145)
Total Bilirubin: 0.6 mg/dL (ref 0.3–1.2)
Total Protein: 7.3 g/dL (ref 6.5–8.1)

## 2020-06-14 LAB — CBC WITH DIFFERENTIAL/PLATELET
Abs Immature Granulocytes: 0.06 10*3/uL (ref 0.00–0.07)
Basophils Absolute: 0.1 10*3/uL (ref 0.0–0.1)
Basophils Relative: 1 %
Eosinophils Absolute: 0.2 10*3/uL (ref 0.0–0.5)
Eosinophils Relative: 3 %
HCT: 42.4 % (ref 39.0–52.0)
Hemoglobin: 13.8 g/dL (ref 13.0–17.0)
Immature Granulocytes: 1 %
Lymphocytes Relative: 33 %
Lymphs Abs: 3.1 10*3/uL (ref 0.7–4.0)
MCH: 29 pg (ref 26.0–34.0)
MCHC: 32.5 g/dL (ref 30.0–36.0)
MCV: 89.1 fL (ref 80.0–100.0)
Monocytes Absolute: 0.5 10*3/uL (ref 0.1–1.0)
Monocytes Relative: 6 %
Neutro Abs: 5.4 10*3/uL (ref 1.7–7.7)
Neutrophils Relative %: 56 %
Platelets: 269 10*3/uL (ref 150–400)
RBC: 4.76 MIL/uL (ref 4.22–5.81)
RDW: 14.6 % (ref 11.5–15.5)
WBC: 9.3 10*3/uL (ref 4.0–10.5)
nRBC: 0.3 % — ABNORMAL HIGH (ref 0.0–0.2)

## 2020-06-14 LAB — TROPONIN I (HIGH SENSITIVITY): Troponin I (High Sensitivity): 19 ng/L — ABNORMAL HIGH (ref <18)

## 2020-06-14 NOTE — ED Triage Notes (Signed)
Patient ambulatory to triage with steady gait, without difficulty or distress noted; pt reports elevated BP today (158/112); has been off BP meds x 1-86yrs; st HTN accomp by dizziness, weakness, left sided CP radiating into left arm

## 2020-06-15 ENCOUNTER — Emergency Department
Admission: EM | Admit: 2020-06-15 | Discharge: 2020-06-15 | Disposition: A | Discharge status: Home / Self Care | Payer: Self-pay | Attending: Emergency Medicine | Admitting: Emergency Medicine

## 2020-06-15 NOTE — ED Notes (Signed)
No answer when called several times from lobby 

## 2020-06-15 NOTE — ED Notes (Signed)
No answer when called several times from lobby & outside; no answer when phone called

## 2020-06-15 NOTE — ED Notes (Signed)
No answer when called several times from lobby for recheck...no answer when phone called

## 2020-06-16 ENCOUNTER — Telehealth: Payer: Self-pay | Admitting: Emergency Medicine

## 2020-06-16 NOTE — Telephone Encounter (Signed)
Called patient due to lwot to inquire about condition and follow up plans. He says he is still feeling bad.  I asked him what his plans are and he did not have one.  He said that the ED takes so long.  I asked him about getting a pcp and about [facility] trying to contact him.  He says he never got any calls from them.  I told him to call and leave them a message and make sure they have correct phone number.  He agrees to that. I told him he can return here any time, and especially if he gets worse or chest pain.  I told him it was really important to establish with pcp to get his blood pressure under control.

## 2020-07-22 IMAGING — CT CT ANGIO CHEST
4 of 7 series · 18 of 46 positions shown · IV contrast (iopamidol)
Comparison: Chest x-ray 06/17/2018

CLINICAL DATA: Worsening chest pain

EXAM:
CT ANGIOGRAPHY CHEST WITH CONTRAST
TECHNIQUE: Multidetector CT imaging of the chest was performed using the
standard protocol during bolus administration of intravenous
contrast. Multiplanar CT image reconstructions and MIPs were
obtained to evaluate the vascular anatomy.
CONTRAST:  100mL VWJVAQ-LPU IOPAMIDOL (VWJVAQ-LPU) INJECTION 76%

[Series 3: axial pre · axial · non-contrast · 0.70mm/px · z∈[-152,+68]mm · 5 of 67 slices shown]
[im 12/67  lung]
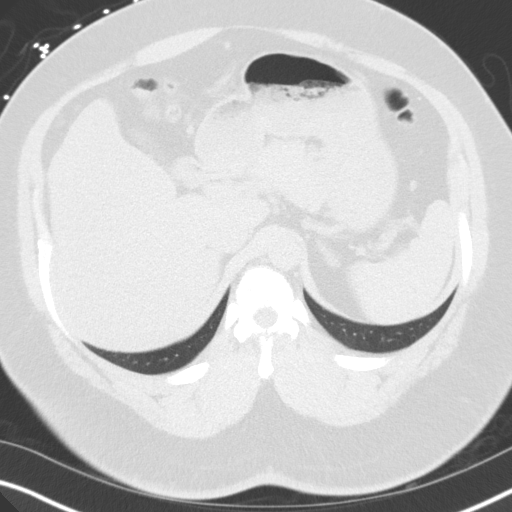
[im 23/67  lung]
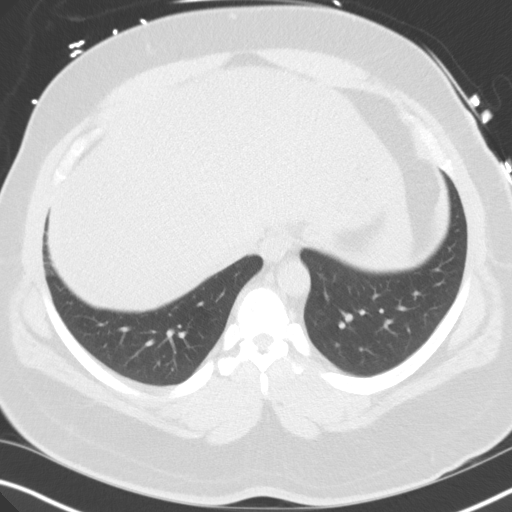
[im 34/67  lung]
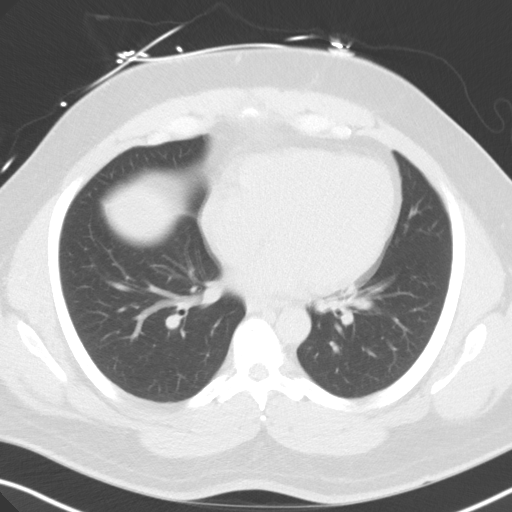
[im 45/67  lung]
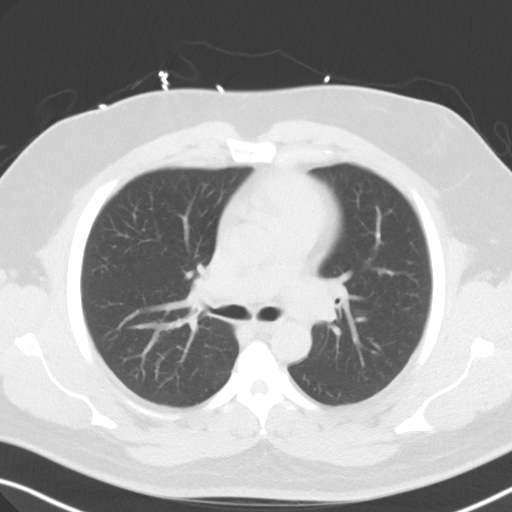
[im 56/67  lung]
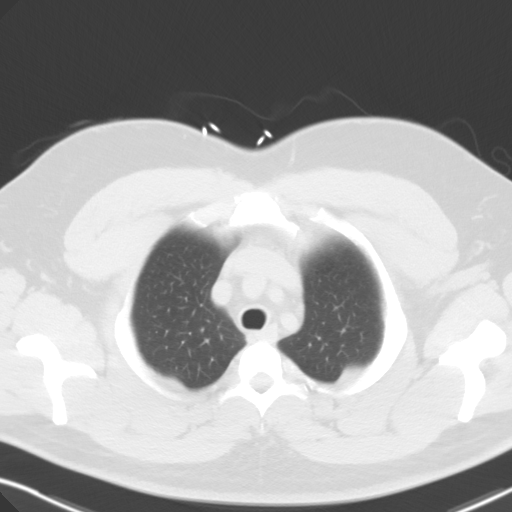

[Series 6: axial arterial · axial · arterial · 0.70mm/px · z∈[-168,+84]mm · 8 of 110 slices shown]
[im 13/110  lung]
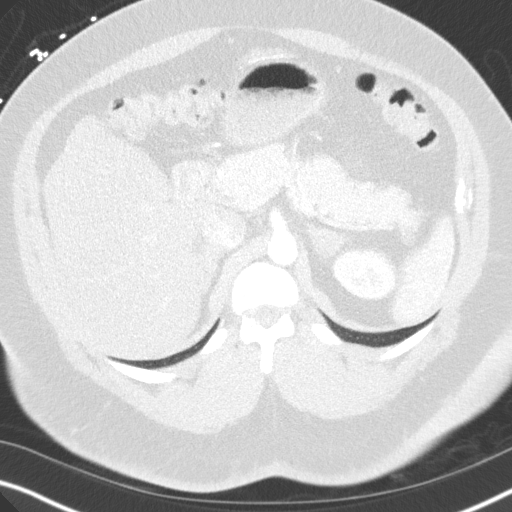
[im 25/110  soft-tissue]
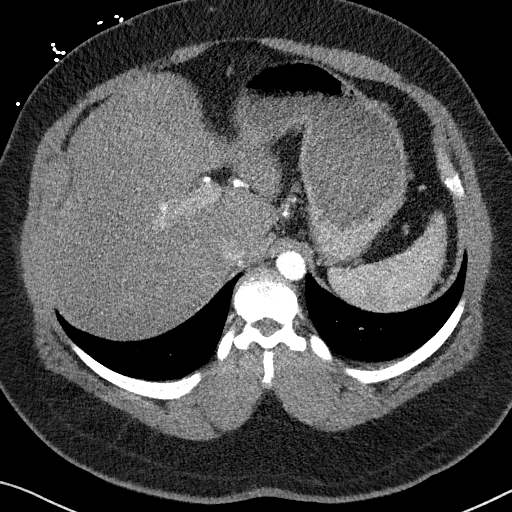
[im 37/110  lung]
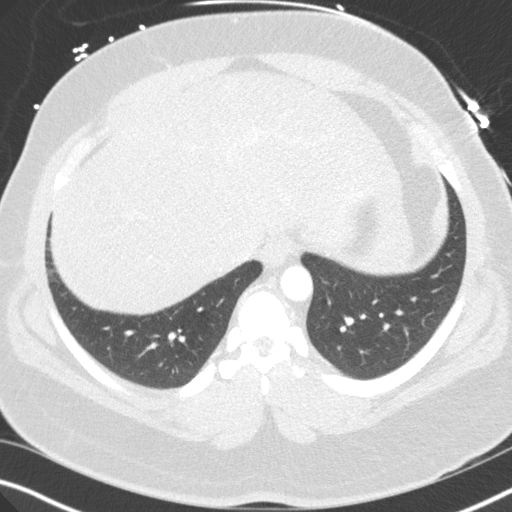
[im 49/110  soft-tissue]
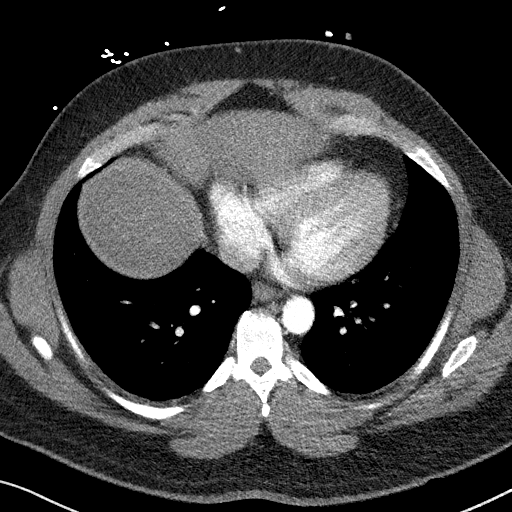
[im 61/110  lung]
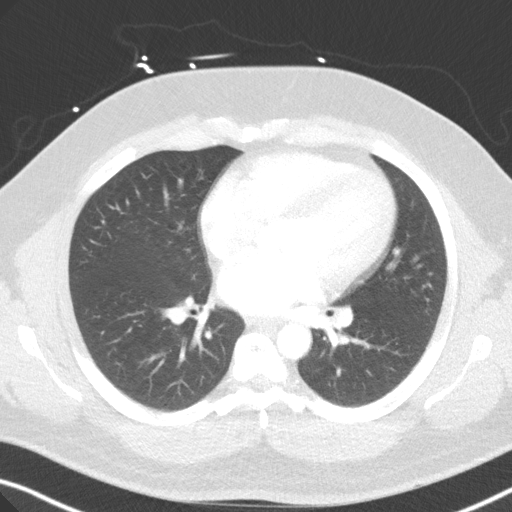
[im 73/110  soft-tissue]
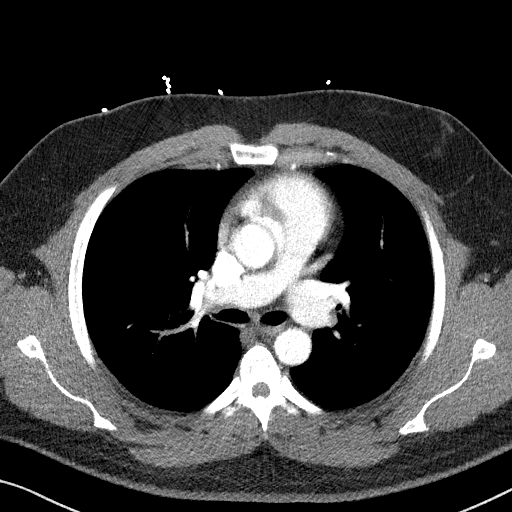
[im 85/110  lung]
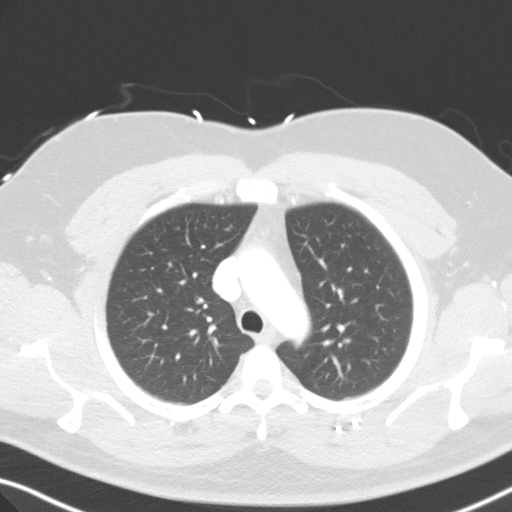
[im 97/110  soft-tissue]
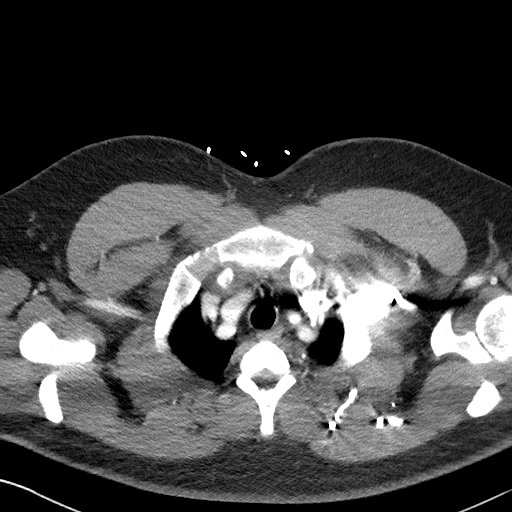

[Series 7: lung · axial · 0.70mm/px · z∈[-183,-135]mm · 2 of 165 slices shown]
[im 12/165  soft-tissue]
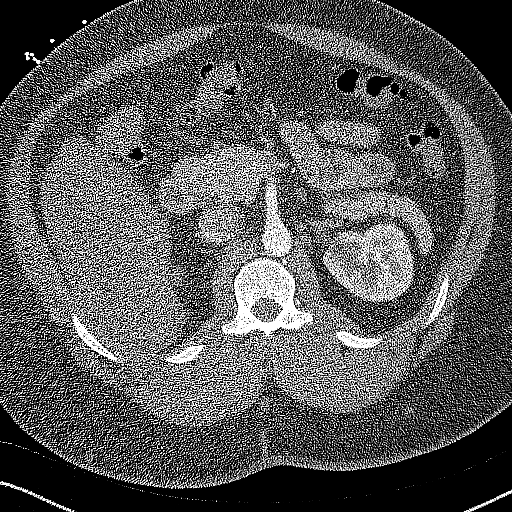
[im 36/165  soft-tissue]
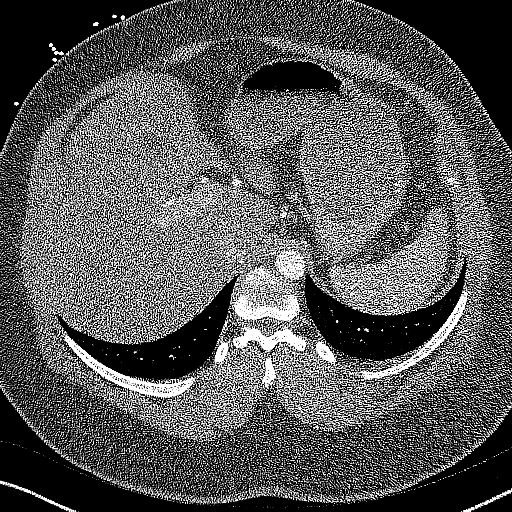

[Series 9: coronals · coronal · 0.64mm/px · 3 of 163 slices shown]
[im 41/163  soft-tissue]
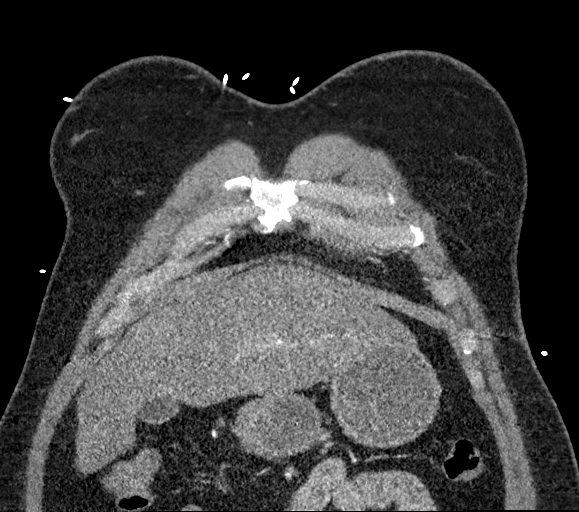
[im 82/163  soft-tissue]
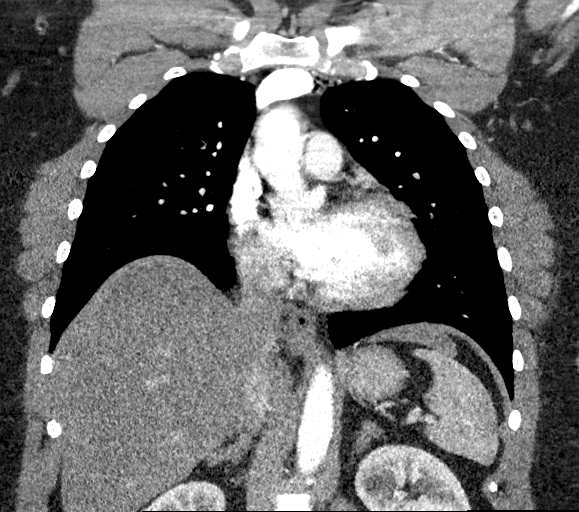
[im 122/163  soft-tissue]
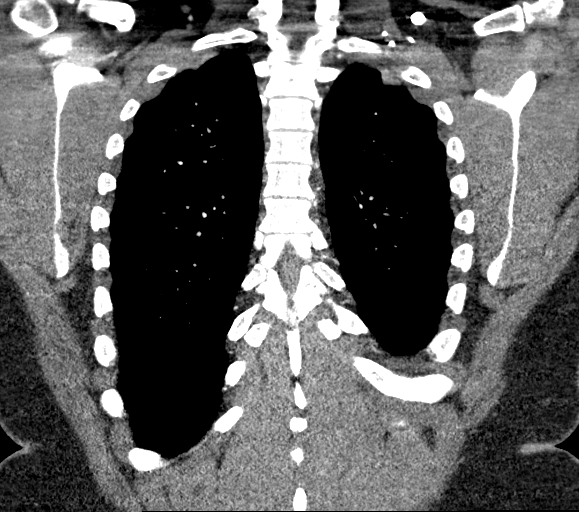

[18 of 46 positions shown; findings below may reference images not displayed]

FINDINGS: Cardiovascular: Non contrasted images of the chest demonstrate no
evidence for intramural hematoma. Nonaneurysmal aorta. No dissection
seen. Pulsation artifact at the ascending aorta. Normal heart size.
No pericardial effusion

Mediastinum/Nodes: No enlarged mediastinal, hilar, or axillary lymph
nodes. Thyroid gland, trachea, and esophagus demonstrate no
significant findings.

Lungs/Pleura: 4 mm right apical lung nodule, series 7, image number
23. No acute consolidation, pleural effusion or pneumothorax.

Upper Abdomen: No acute abnormality. 18 mm left adrenal gland nodule
with fat density consistent with adenoma. Diffuse nodularity of the
right adrenal gland with 17 mm right adrenal gland fatty nodule.

Musculoskeletal: No chest wall abnormality. No acute or significant
osseous findings.

Review of the MIP images confirms the above findings.
IMPRESSION: 1. Negative for aortic aneurysm or aortic dissection.
2. 4 mm right apical lung nodule. No follow-up needed if patient is
low-risk. Non-contrast chest CT can be considered in 12 months if
patient is high-risk. This recommendation follows the consensus
statement: Guidelines for Management of Incidental Pulmonary Nodules
Detected on CT Images: From the [HOSPITAL] 0212; Radiology
3. Bilateral adrenal gland nodules, suspected to represent adenoma.

## 2020-08-11 ENCOUNTER — Emergency Department
Admission: EM | Admit: 2020-08-11 | Discharge: 2020-08-11 | Disposition: A | Discharge status: Home / Self Care | Payer: Self-pay | Attending: Emergency Medicine | Admitting: Emergency Medicine

## 2020-08-11 ENCOUNTER — Emergency Department: Payer: Self-pay

## 2020-08-11 ENCOUNTER — Other Ambulatory Visit: Payer: Self-pay

## 2020-08-11 DIAGNOSIS — J45909 Unspecified asthma, uncomplicated: Secondary | ICD-10-CM | POA: Insufficient documentation

## 2020-08-11 DIAGNOSIS — Z79899 Other long term (current) drug therapy: Secondary | ICD-10-CM | POA: Insufficient documentation

## 2020-08-11 DIAGNOSIS — I10 Essential (primary) hypertension: Secondary | ICD-10-CM | POA: Insufficient documentation

## 2020-08-11 DIAGNOSIS — R1032 Left lower quadrant pain: Secondary | ICD-10-CM | POA: Insufficient documentation

## 2020-08-11 DIAGNOSIS — K649 Unspecified hemorrhoids: Secondary | ICD-10-CM | POA: Insufficient documentation

## 2020-08-11 DIAGNOSIS — F17210 Nicotine dependence, cigarettes, uncomplicated: Secondary | ICD-10-CM | POA: Insufficient documentation

## 2020-08-11 LAB — COMPREHENSIVE METABOLIC PANEL
ALT: 31 U/L (ref 0–44)
AST: 35 U/L (ref 15–41)
Albumin: 3.9 g/dL (ref 3.5–5.0)
Alkaline Phosphatase: 90 U/L (ref 38–126)
Anion gap: 13 (ref 5–15)
BUN: 11 mg/dL (ref 6–20)
CO2: 20 mmol/L — ABNORMAL LOW (ref 22–32)
Calcium: 8.7 mg/dL — ABNORMAL LOW (ref 8.9–10.3)
Chloride: 103 mmol/L (ref 98–111)
Creatinine, Ser: 0.9 mg/dL (ref 0.61–1.24)
GFR calc Af Amer: >60 mL/min (ref >60)
GFR calc non Af Amer: >60 mL/min (ref >60)
Glucose, Bld: 175 mg/dL — ABNORMAL HIGH (ref 70–99)
Potassium: 3.1 mmol/L — ABNORMAL LOW (ref 3.5–5.1)
Sodium: 136 mmol/L (ref 135–145)
Total Bilirubin: 0.8 mg/dL (ref 0.3–1.2)
Total Protein: 7.2 g/dL (ref 6.5–8.1)

## 2020-08-11 LAB — URINALYSIS, COMPLETE (UACMP) WITH MICROSCOPIC
Bacteria, UA: NONE SEEN
Bilirubin Urine: NEGATIVE
Glucose, UA: NEGATIVE mg/dL
Hgb urine dipstick: NEGATIVE
Ketones, ur: NEGATIVE mg/dL
Leukocytes,Ua: NEGATIVE
Nitrite: NEGATIVE
Protein, ur: NEGATIVE mg/dL
Specific Gravity, Urine: 1.025 (ref 1.005–1.030)
Squamous Epithelial / HPF: NONE SEEN (ref 0–5)
pH: 5 (ref 5.0–8.0)

## 2020-08-11 LAB — CBC
HCT: 42.6 % (ref 39.0–52.0)
Hemoglobin: 14.4 g/dL (ref 13.0–17.0)
MCH: 30.1 pg (ref 26.0–34.0)
MCHC: 33.8 g/dL (ref 30.0–36.0)
MCV: 89.1 fL (ref 80.0–100.0)
Platelets: 284 10*3/uL (ref 150–400)
RBC: 4.78 MIL/uL (ref 4.22–5.81)
RDW: 14.1 % (ref 11.5–15.5)
WBC: 9.6 10*3/uL (ref 4.0–10.5)
nRBC: 0 % (ref 0.0–0.2)

## 2020-08-11 LAB — TROPONIN I (HIGH SENSITIVITY): Troponin I (High Sensitivity): 17 ng/L (ref <18)

## 2020-08-11 LAB — LIPASE, BLOOD: Lipase: 46 U/L (ref 11–51)

## 2020-08-11 MED ORDER — METOPROLOL TARTRATE 25 MG PO TABS: 12.5000 mg | ORAL_TABLET | Freq: Two times a day (BID) | ORAL | 3 refills | Status: DC

## 2020-08-11 MED ORDER — NAPROXEN 500 MG PO TABS
500.0000 mg | ORAL_TABLET | Freq: Two times a day (BID) | ORAL | 2 refills | Status: DC
Start: 2020-08-11 — End: 2020-08-11

## 2020-08-11 MED ORDER — AMOXICILLIN-POT CLAVULANATE 875-125 MG PO TABS: 1.0000 | ORAL_TABLET | Freq: Two times a day (BID) | ORAL | 0 refills | Status: AC

## 2020-08-11 MED ORDER — NAPROXEN 500 MG PO TABS
500.0000 mg | ORAL_TABLET | Freq: Two times a day (BID) | ORAL | 2 refills | Status: DC
Start: 2020-08-11 — End: 2020-08-31

## 2020-08-11 MED ORDER — AMOXICILLIN-POT CLAVULANATE 875-125 MG PO TABS
1.0000 | ORAL_TABLET | Freq: Two times a day (BID) | ORAL | 0 refills | Status: DC
Start: 2020-08-11 — End: 2020-08-11

## 2020-08-11 MED ORDER — METOPROLOL TARTRATE 25 MG PO TABS
25.0000 mg | ORAL_TABLET | Freq: Once | ORAL | Status: DC
  Filled 2020-08-11: qty 1

## 2020-08-11 NOTE — ED Provider Notes (Signed)
[facility] Emergency Department Provider Note   ____________________________________________    I have reviewed the triage vital signs and the nursing notes.   HISTORY  Chief Complaint Abdominal pain, palpitations, difficulty sleeping    HPI Logan Lucas is a 44 y.o. male with a history of hypertension who presents with multiple complaints.  He notes for the last 2 days he has had bilateral lower abdominal pain, left greater than right which he describes as an aching sensation.  Denies nausea or vomiting.  Over the last 2 weeks he has noticed blood in his stool only with bowel movements, he describes it as bright red on the toilet paper.  Not on blood thinners.  In addition he reports that he has felt his heart fluttering intermittently for some time, this seems to be more common.  He also notes that he has a hard time sleeping and is requesting sleeping medication  Past Medical History:  Diagnosis Date  . Asthma   . Hypertension     Patient Active Problem List   Diagnosis Date Noted  . Duodenitis 10/14/2019  . Chest pain 06/17/2018    Past Surgical History:  Procedure Laterality Date  . none      Prior to Admission medications   Medication Sig Start Date End Date Taking? Authorizing Provider  amoxicillin-clavulanate (AUGMENTIN) 875-125 MG tablet Take 1 tablet by mouth 2 (two) times daily for 7 days. 08/11/20 08/18/20  [name], MD  cyclobenzaprine (FLEXERIL) 5 MG tablet Take 1-2 tablets (5-10 mg total) by mouth 3 (three) times daily as needed for muscle spasms. 12/26/19   Duanne Guess, PA-C  dicyclomine (BENTYL) 10 MG capsule Take 1 capsule (10 mg total) by mouth 4 (four) times daily for 14 days. 05/07/20 05/21/20  Cuthriell, Charline Bills, PA-C  metoprolol tartrate (LOPRESSOR) 25 MG tablet Take 0.5 tablets (12.5 mg total) by mouth 2 (two) times daily. 08/11/20 12/09/20  [name], MD  naproxen (NAPROSYN) 500 MG tablet Take 1 tablet  (500 mg total) by mouth 2 (two) times daily with a meal. 08/11/20   [name], MD  omeprazole (PRILOSEC) 10 MG capsule Take 1 capsule (10 mg total) by mouth daily. 05/07/20   Cuthriell, Charline Bills, PA-C  oxyCODONE (OXY IR/ROXICODONE) 5 MG immediate release tablet Take 1 tablet (5 mg total) by mouth every 4 (four) hours as needed for moderate pain. 10/17/19   Swayze, Ava, DO  SUMAtriptan (IMITREX) 50 MG tablet Take 1 tablet (50 mg total) by mouth once as needed for migraine. May repeat in 2 hours if headache persists or recurs. 05/07/20 05/08/21  Cuthriell, Charline Bills, PA-C     Allergies Patient has no known allergies.  Family History  Problem Relation Age of Onset  . Diabetes Mother   . Cancer Father     Social History Social History   Tobacco Use  . Smoking status: Current Every Day Smoker    Packs/day: 1.00    Years: 5.00    Pack years: 5.00    Types: Cigarettes  . Smokeless tobacco: Never Used  Vaping Use  . Vaping Use: Never used  Substance Use Topics  . Alcohol use: Yes    Alcohol/week: 7.0 standard drinks    Types: 2 Cans of beer, 5 Shots of liquor per week  . Drug use: No    Frequency: 1.0 times per week    Review of Systems  Constitutional: No fever/chills Eyes: No visual changes.  ENT: No sore  throat. Cardiovascular: As above Respiratory: Denies shortness of breath. Gastrointestinal: As above Genitourinary: Negative for dysuria.  No hematuria Musculoskeletal: Negative for back pain. Skin: Negative for rash. Neurological: Negative for headaches   ____________________________________________   PHYSICAL EXAM:  VITAL SIGNS: ED Triage Vitals  Enc Vitals Group     BP 08/11/20 0106 (!) 155/82     Pulse Rate 08/11/20 0106 96     Resp 08/11/20 0106 20     Temp 08/11/20 0106 98.7 F (37.1 C)     Temp Source 08/11/20 0106 Oral     SpO2 08/11/20 0106 100 %     Weight 08/11/20 0105 115.7 kg (255 lb)     Height 08/11/20 0105 1.753 m (5\' 9" )     Head  Circumference --      Peak Flow --      Pain Score 08/11/20 0105 8     Pain Loc --      Pain Edu? --      Excl. in [place]? --     Constitutional: Alert and oriented  Nose: No congestion/rhinnorhea. Mouth/Throat: Mucous membranes are moist.    Cardiovascular: Normal rate, regular rhythm. Grossly normal heart sounds.  Good peripheral circulation. Respiratory: Normal respiratory effort.  No retractions. Lungs CTAB. Gastrointestinal: Soft and nontender. No distention.  No CVA tenderness.  Reassuring exam  Musculoskeletal: No lower extremity tenderness nor edema.  Warm and well perfused Neurologic:  Normal speech and language. No gross focal neurologic deficits are appreciated.  Skin:  Skin is warm, dry and intact. No rash noted. Psychiatric: Mood and affect are normal. Speech and behavior are normal.  ____________________________________________   LABS (all labs ordered are listed, but only abnormal results are displayed)  Labs Reviewed  COMPREHENSIVE METABOLIC PANEL - Abnormal; Notable for the following components:      Result Value   Potassium 3.1 (*)    CO2 20 (*)    Glucose, Bld 175 (*)    Calcium 8.7 (*)    All other components within normal limits  URINALYSIS, COMPLETE (UACMP) WITH MICROSCOPIC - Abnormal; Notable for the following components:   Color, Urine YELLOW (*)    APPearance HAZY (*)    All other components within normal limits  CBC  LIPASE, BLOOD  TROPONIN I (HIGH SENSITIVITY)  TROPONIN I (HIGH SENSITIVITY)   ____________________________________________  EKG  ED ECG REPORT I, [name], the attending physician, personally viewed and interpreted this ECG.  Date: 08/11/2020  Rhythm: normal sinus rhythm QRS Axis: normal Intervals: normal ST/T Wave abnormalities: normal Narrative Interpretation: no evidence of acute ischemia  ____________________________________________  RADIOLOGY  Chest x-ray read by me, no infiltrate effusion or  pneumothorax ____________________________________________   PROCEDURES  Procedure(s) performed: No  Procedures   Critical Care performed: No ____________________________________________   INITIAL IMPRESSION / ASSESSMENT AND PLAN / ED COURSE  Pertinent labs & imaging results that were available during my care of the patient were reviewed by me and considered in my medical decision making (see chart for details).  Patient presents with multiple complaints as above.  Suspect internal hemorrhoids based on complaint, improved toileting habits discussed outpatient follow-up with GI, hemoglobin here is quite reassuring, not on blood thinners.  Patient may be having PVCs or other arrhythmia given his complaint of fluttering, he will require cardiology follow-up and we will restart his metoprolol which she ran out of quite some time ago.  This should also help with his blood pressure.  We will prescribe Augmentin for possible  diverticulitis given left lower quadrant discomfort, normal white blood cell count, return precautions discussed    ____________________________________________   FINAL CLINICAL IMPRESSION(S) / ED DIAGNOSES  Final diagnoses:  Essential hypertension  Left lower quadrant abdominal pain  Hemorrhoids, unspecified hemorrhoid type        Note:  This document was prepared using Dragon voice recognition software and may include unintentional dictation errors.   [name], MD 08/11/20 1036

## 2020-08-11 NOTE — ED Triage Notes (Signed)
Pt in with co chest pain for few days and tingling to left arm for 1 hr pta. Co lightlessness, and htn at home. Pt states was dx with htn in the past but is not taking bp meds.  Also co bloody stools x 1 week hx of the same and was told it was his appendix. States was treated with antibiotics during that admission.

## 2020-08-23 ENCOUNTER — Emergency Department
Admission: EM | Admit: 2020-08-23 | Discharge: 2020-08-23 | Disposition: A | Discharge status: Home / Self Care | Payer: Self-pay | Attending: Emergency Medicine | Admitting: Emergency Medicine

## 2020-08-23 DIAGNOSIS — Z5321 Procedure and treatment not carried out due to patient leaving prior to being seen by health care provider: Secondary | ICD-10-CM | POA: Insufficient documentation

## 2020-08-23 DIAGNOSIS — R519 Headache, unspecified: Secondary | ICD-10-CM | POA: Insufficient documentation

## 2020-08-23 NOTE — ED Triage Notes (Signed)
Pt here for headache that started last night.  Reports took 3200 mg motrin since last night.  This RN about to call poison control and pt reports his brother is here to take him somewhere else because he does not want to wait.  VSS. Pt is alert and oriented. Denies SI.  Ambulatory out of triage room.

## 2020-08-31 ENCOUNTER — Emergency Department: Payer: Self-pay

## 2020-08-31 ENCOUNTER — Emergency Department
Admission: EM | Admit: 2020-08-31 | Discharge: 2020-08-31 | Disposition: A | Discharge status: Home / Self Care | Payer: Self-pay | Attending: Emergency Medicine | Admitting: Emergency Medicine

## 2020-08-31 ENCOUNTER — Other Ambulatory Visit: Payer: Self-pay

## 2020-08-31 DIAGNOSIS — F17210 Nicotine dependence, cigarettes, uncomplicated: Secondary | ICD-10-CM | POA: Insufficient documentation

## 2020-08-31 DIAGNOSIS — R10817 Generalized abdominal tenderness: Secondary | ICD-10-CM | POA: Insufficient documentation

## 2020-08-31 DIAGNOSIS — J45909 Unspecified asthma, uncomplicated: Secondary | ICD-10-CM | POA: Insufficient documentation

## 2020-08-31 DIAGNOSIS — K2901 Acute gastritis with bleeding: Secondary | ICD-10-CM | POA: Insufficient documentation

## 2020-08-31 DIAGNOSIS — I10 Essential (primary) hypertension: Secondary | ICD-10-CM | POA: Insufficient documentation

## 2020-08-31 DIAGNOSIS — K921 Melena: Secondary | ICD-10-CM

## 2020-08-31 HISTORY — DX: Gastrointestinal hemorrhage, unspecified: K92.2

## 2020-08-31 LAB — COMPREHENSIVE METABOLIC PANEL
ALT: 39 U/L (ref 0–44)
AST: 53 U/L — ABNORMAL HIGH (ref 15–41)
Albumin: 3.9 g/dL (ref 3.5–5.0)
Alkaline Phosphatase: 78 U/L (ref 38–126)
Anion gap: 14 (ref 5–15)
BUN: 12 mg/dL (ref 6–20)
CO2: 19 mmol/L — ABNORMAL LOW (ref 22–32)
Calcium: 9 mg/dL (ref 8.9–10.3)
Chloride: 102 mmol/L (ref 98–111)
Creatinine, Ser: 1.14 mg/dL (ref 0.61–1.24)
GFR calc non Af Amer: >60 mL/min (ref >60)
Glucose, Bld: 256 mg/dL — ABNORMAL HIGH (ref 70–99)
Potassium: 3.5 mmol/L (ref 3.5–5.1)
Sodium: 135 mmol/L (ref 135–145)
Total Bilirubin: 1 mg/dL (ref 0.3–1.2)
Total Protein: 6.6 g/dL (ref 6.5–8.1)

## 2020-08-31 LAB — URINALYSIS, COMPLETE (UACMP) WITH MICROSCOPIC
Glucose, UA: 50 mg/dL — AB
Hgb urine dipstick: NEGATIVE
Ketones, ur: 5 mg/dL — AB
Leukocytes,Ua: NEGATIVE
Nitrite: NEGATIVE
Protein, ur: 100 mg/dL — AB
Specific Gravity, Urine: 1.029 (ref 1.005–1.030)
pH: 5 (ref 5.0–8.0)

## 2020-08-31 LAB — CBC
HCT: 47.6 % (ref 39.0–52.0)
HCT: 48.6 % (ref 39.0–52.0)
Hemoglobin: 16.9 g/dL (ref 13.0–17.0)
Hemoglobin: 16.5 g/dL (ref 13.0–17.0)
MCH: 30.7 pg (ref 26.0–34.0)
MCH: 30.4 pg (ref 26.0–34.0)
MCHC: 35.5 g/dL (ref 30.0–36.0)
MCHC: 34 g/dL (ref 30.0–36.0)
MCV: 86.4 fL (ref 80.0–100.0)
MCV: 89.7 fL (ref 80.0–100.0)
Platelets: 272 10*3/uL (ref 150–400)
Platelets: 273 10*3/uL (ref 150–400)
RBC: 5.51 MIL/uL (ref 4.22–5.81)
RBC: 5.42 MIL/uL (ref 4.22–5.81)
RDW: 13.8 % (ref 11.5–15.5)
RDW: 13.9 % (ref 11.5–15.5)
WBC: 9.3 10*3/uL (ref 4.0–10.5)
WBC: 10.7 10*3/uL — ABNORMAL HIGH (ref 4.0–10.5)
nRBC: 0.2 % (ref 0.0–0.2)
nRBC: 0.3 % — ABNORMAL HIGH (ref 0.0–0.2)

## 2020-08-31 LAB — LIPASE, BLOOD: Lipase: 51 U/L (ref 11–51)

## 2020-08-31 MED ORDER — PANTOPRAZOLE SODIUM 40 MG PO TBEC
40.0000 mg | DELAYED_RELEASE_TABLET | Freq: Once | ORAL | Status: AC
  Administered 2020-08-31: 40 mg via ORAL
  Filled 2020-08-31: qty 1

## 2020-08-31 MED ORDER — IOHEXOL 300 MG/ML  SOLN
125.0000 mL | Freq: Once | INTRAMUSCULAR | Status: AC | PRN
  Administered 2020-08-31: 125 mL via INTRAVENOUS
  Filled 2020-08-31: qty 125

## 2020-08-31 MED ORDER — SUCRALFATE 1 G PO TABS: 1.0000 g | ORAL_TABLET | Freq: Four times a day (QID) | ORAL | 0 refills | Status: DC

## 2020-08-31 MED ORDER — PROCHLORPERAZINE MALEATE 5 MG PO TABS: 5.0000 mg | ORAL_TABLET | Freq: Three times a day (TID) | ORAL | 0 refills | Status: DC | PRN

## 2020-08-31 MED ORDER — PANTOPRAZOLE SODIUM 40 MG IV SOLR: 40.0000 mg | Freq: Once | INTRAVENOUS | Status: DC

## 2020-08-31 MED ORDER — LACTATED RINGERS IV BOLUS
1000.0000 mL | Freq: Once | INTRAVENOUS | Status: AC
  Administered 2020-08-31: 1000 mL via INTRAVENOUS

## 2020-08-31 MED ORDER — OMEPRAZOLE 20 MG PO CPDR: 20.0000 mg | DELAYED_RELEASE_CAPSULE | Freq: Every day | ORAL | 0 refills | Status: DC

## 2020-08-31 NOTE — ED Notes (Signed)
Pt presents today with bloody stool, abdominal pain and sore throat. 20 G placed L hand, blood drawn and saline locked.

## 2020-08-31 NOTE — ED Provider Notes (Signed)
[facility] Emergency Department Provider Note  ____________________________________________   First MD Initiated Contact with Patient 08/31/20 1707     (approximate)  I have reviewed the triage vital signs and the nursing notes.   HISTORY  Chief Complaint Sore Throat    HPI Logan Lucas is a 44 y.o. male with history of hypertension, GI bleed with duodenitis, here with abdominal pain, blood in stool, and nausea.  The patient states that for the last week, he has had progressively worsening blood in his stools and generalized abdominal pain.  He states he initially noticed a drop or 2 of blood around his stool, but has since had grossly bloody stool as well as diffuse, aching, gnawing, cramp-like abdominal pain.  The pain is worse with bowel movements.  No change with eating.  No alleviating factors.  No fevers but he has had some chills as well as night sweats.  No known recent weight loss.  Of note, he does endorse fairly regular ibuprofen use.  He has been admitted previously for GI bleed and states he was diagnosed with a "infection" in his stomach.  Unclear whether he had EGD or colonoscopy at that time.        Past Medical History:  Diagnosis Date  . Asthma   . GI bleed   . Hypertension     Patient Active Problem List   Diagnosis Date Noted  . Duodenitis 10/14/2019  . Chest pain 06/17/2018    Past Surgical History:  Procedure Laterality Date  . none      Prior to Admission medications   Medication Sig Start Date End Date Taking? Authorizing Provider  cyclobenzaprine (FLEXERIL) 5 MG tablet Take 1-2 tablets (5-10 mg total) by mouth 3 (three) times daily as needed for muscle spasms. 12/26/19   Duanne Guess, PA-C  dicyclomine (BENTYL) 10 MG capsule Take 1 capsule (10 mg total) by mouth 4 (four) times daily for 14 days. 05/07/20 05/21/20  Cuthriell, Charline Bills, PA-C  metoprolol tartrate (LOPRESSOR) 25 MG tablet Take 0.5 tablets (12.5 mg  total) by mouth 2 (two) times daily. 08/11/20 12/09/20  [name], MD  omeprazole (PRILOSEC) 20 MG capsule Take 1 capsule (20 mg total) by mouth daily for 14 days. 08/31/20 09/14/20  Duffy Bruce, MD  oxyCODONE (OXY IR/ROXICODONE) 5 MG immediate release tablet Take 1 tablet (5 mg total) by mouth every 4 (four) hours as needed for moderate pain. 10/17/19   Swayze, Ava, DO  prochlorperazine (COMPAZINE) 5 MG tablet Take 1 tablet (5 mg total) by mouth every 8 (eight) hours as needed (headache, nausea). 08/31/20   Duffy Bruce, MD  sucralfate (CARAFATE) 1 g tablet Take 1 tablet (1 g total) by mouth 4 (four) times daily for 5 days. 08/31/20 09/05/20  Duffy Bruce, MD  SUMAtriptan (IMITREX) 50 MG tablet Take 1 tablet (50 mg total) by mouth once as needed for migraine. May repeat in 2 hours if headache persists or recurs. 05/07/20 05/08/21  Cuthriell, Charline Bills, PA-C    Allergies Patient has no known allergies.  Family History  Problem Relation Age of Onset  . Diabetes Mother   . Cancer Father     Social History Social History   Tobacco Use  . Smoking status: Current Every Day Smoker    Packs/day: 1.00    Years: 5.00    Pack years: 5.00    Types: Cigarettes  . Smokeless tobacco: Never Used  Vaping Use  . Vaping Use: Never used  Substance Use Topics  . Alcohol use: Yes    Alcohol/week: 7.0 standard drinks    Types: 2 Cans of beer, 5 Shots of liquor per week    Comment: weekends  . Drug use: No    Frequency: 1.0 times per week    Review of Systems  Review of Systems  Constitutional: Positive for fatigue. Negative for chills and fever.  HENT: Negative for sore throat.   Respiratory: Negative for shortness of breath.   Cardiovascular: Negative for chest pain.  Gastrointestinal: Positive for abdominal pain, blood in stool, diarrhea, nausea and vomiting.  Genitourinary: Negative for flank pain.  Musculoskeletal: Negative for neck pain.  Skin: Negative for rash and wound.    Allergic/Immunologic: Negative for immunocompromised state.  Neurological: Negative for weakness and numbness.  Hematological: Does not bruise/bleed easily.  All other systems reviewed and are negative.    ____________________________________________  PHYSICAL EXAM:      VITAL SIGNS: ED Triage Vitals  Enc Vitals Group     BP 08/31/20 1429 139/73     Pulse Rate 08/31/20 1429 (!) 101     Resp 08/31/20 1429 18     Temp 08/31/20 1429 98.4 F (36.9 C)     Temp src --      SpO2 08/31/20 1429 97 %     Weight 08/31/20 1431 253 lb (114.8 kg)     Height 08/31/20 1431 5\' 9"  (1.753 m)     Head Circumference --      Peak Flow --      Pain Score 08/31/20 1431 9     Pain Loc --      Pain Edu? --      Excl. in [place]? --      Physical Exam Vitals and nursing note reviewed.  Constitutional:      General: He is not in acute distress.    Appearance: He is well-developed.  HENT:     Head: Normocephalic and atraumatic.     Mouth/Throat:     Mouth: Mucous membranes are moist.  Eyes:     Conjunctiva/sclera: Conjunctivae normal.  Cardiovascular:     Rate and Rhythm: Normal rate and regular rhythm.     Heart sounds: Normal heart sounds. No murmur heard.  No friction rub.  Pulmonary:     Effort: Pulmonary effort is normal. No respiratory distress.     Breath sounds: Normal breath sounds. No wheezing or rales.  Abdominal:     General: There is no distension.     Palpations: Abdomen is soft.     Tenderness: There is abdominal tenderness (Mild, diffuse, without rebound or guarding).  Musculoskeletal:     Cervical back: Neck supple.  Skin:    General: Skin is warm.     Capillary Refill: Capillary refill takes less than 2 seconds.  Neurological:     Mental Status: He is alert and oriented to person, place, and time.     Motor: No abnormal muscle tone.       ____________________________________________   LABS (all labs ordered are listed, but only abnormal results are  displayed)  Labs Reviewed  COMPREHENSIVE METABOLIC PANEL - Abnormal; Notable for the following components:      Result Value   CO2 19 (*)    Glucose, Bld 256 (*)    AST 53 (*)    All other components within normal limits  URINALYSIS, COMPLETE (UACMP) WITH MICROSCOPIC - Abnormal; Notable for the following components:   Color, Urine AMBER (*)  APPearance CLOUDY (*)    Glucose, UA 50 (*)    Bilirubin Urine SMALL (*)    Ketones, ur 5 (*)    Protein, ur 100 (*)    Bacteria, UA RARE (*)    All other components within normal limits  CBC - Abnormal; Notable for the following components:   WBC 10.7 (*)    nRBC 0.3 (*)    All other components within normal limits  LIPASE, BLOOD  CBC    ____________________________________________  EKG: Normal sinus rhythm, ventricular rate 98.  PR 130, QRS 80, QTc 451.  Nonspecific T wave abnormalities.  No acute ST elevations or depressions. ________________________________________  RADIOLOGY All imaging, including plain films, CT scans, and ultrasounds, independently reviewed by me, and interpretations confirmed via formal radiology reads.  ED MD interpretation:   CT Abd/Pelvis: Negative for acute abnormality  Official radiology report(s): CT ABDOMEN PELVIS W CONTRAST  Result Date: 08/31/2020 CLINICAL DATA:  Gastrointestinal hemorrhage, vomiting, diffuse abdominal pain EXAM: CT ABDOMEN AND PELVIS WITH CONTRAST TECHNIQUE: Multidetector CT imaging of the abdomen and pelvis was performed using the standard protocol following bolus administration of intravenous contrast. CONTRAST:  115mL OMNIPAQUE IOHEXOL 300 MG/ML  SOLN COMPARISON:  05/07/2020 FINDINGS: Lower chest: The visualized lung bases are clear bilaterally. The visualized heart and pericardium are unremarkable. Hepatobiliary: Severe hepatic steatosis and mild hepatomegaly is again seen, unchanged from prior examination. No focal hepatic mass is identified. No intra or extrahepatic biliary ductal  dilation. Gallbladder unremarkable peer Pancreas: Unremarkable Spleen: Unremarkable Adrenals/Urinary Tract: 2 right-sided and 1 left-sided adrenal nodules are again identified and are stable since examinations dating back to 10/14/2019. 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 Simple cortical cyst noted within the left kidney. The kidneys are otherwise unremarkable. The bladder is unremarkable. Stomach/Bowel: Mild descending colonic diverticulosis. The stomach, small bowel, and large bowel are otherwise unremarkable. Appendix normal. No free intraperitoneal gas or fluid. Vascular/Lymphatic: Note is made of a retroaortic left renal vein. Minimal atherosclerotic calcification is noted within the common iliac arteries bilaterally. The abdominal vasculature is otherwise unremarkable. Reproductive: Prostate is unremarkable. Other: Tiny right fat containing inguinal hernia. Rectum unremarkable. Musculoskeletal: The osseous structures are unremarkable. IMPRESSION: No definite radiographic explanation for the patient's reported symptoms. Incidental findings as noted above. Aortic Atherosclerosis (ICD10-I70.0). Electronically Signed   By: Fidela Salisbury MD   On: 08/31/2020 19:11    ____________________________________________  PROCEDURES   Procedure(s) performed (including Critical Care):  Procedures  ____________________________________________  INITIAL IMPRESSION / MDM / [place] / ED COURSE  As part of my medical decision making, I reviewed the following data within the [place] notes reviewed and incorporated, Old chart reviewed, Notes from prior ED visits, and [place] Controlled Substance Database       *Logan Lucas was evaluated in Emergency Department on 08/31/2020 for the symptoms described in the history of present illness. He was evaluated in  the context of the global COVID-19 pandemic, which necessitated consideration that the patient might be at risk for infection with the SARS-CoV-2 virus that causes COVID-19. Institutional protocols and algorithms that pertain to the evaluation of patients at risk for COVID-19 are in a state of rapid change based on information released by regulatory bodies including the CDC and federal and state organizations. These policies and algorithms were followed during the patient's care in the ED.  Some ED evaluations and interventions may  be delayed as a result of limited staffing during the pandemic.*     Medical Decision Making:  44 yo M here with abdominal pain, nausea, vomiting, blood in stool. Suspect gastritis vs duodenitis, recurrent, with h/o heavy NSAID use. History of similar presentations. Pt also endorses occasional blood drops in stool c/w hemorrhoids. He has no evidence of significant ongoing bleed. Hgb is stable in ED. Normal BUN on CMP. Lipase normal. LFTs, renal function normal. Mild dehydration likely with CO2 19. CT scan obtained and is negative for acute abnlmality - reviewed by me.   Suspect recurrent gastritis. No signs of IBD or complications. Hgb stable as mentioned. No apparent indication for emergent GI consult, but will send as outpt given recurrent sx and his history. Will have him stop all NSAIDs, trial compazine for his regular headaches. Tylenol encouraged as well. Return precautions given.  ____________________________________________  FINAL CLINICAL IMPRESSION(S) / ED DIAGNOSES  Final diagnoses:  Acute superficial gastritis with hemorrhage  Blood in stool     MEDICATIONS GIVEN DURING THIS VISIT:  Medications  lactated ringers bolus 1,000 mL (1,000 mLs Intravenous New Bag/Given 08/31/20 1816)  iohexol (OMNIPAQUE) 300 MG/ML solution 125 mL (125 mLs Intravenous Contrast Given 08/31/20 1824)  pantoprazole (PROTONIX) EC tablet 40 mg (40 mg Oral Given 08/31/20 1950)     ED  Discharge Orders         Ordered    omeprazole (PRILOSEC) 20 MG capsule  Daily        08/31/20 1951    sucralfate (CARAFATE) 1 g tablet  4 times daily        08/31/20 1951    prochlorperazine (COMPAZINE) 5 MG tablet  Every 8 hours PRN        08/31/20 1951           Note:  This document was prepared using Dragon voice recognition software and may include unintentional dictation errors.   Duffy Bruce, MD 08/31/20 [phone]

## 2020-08-31 NOTE — ED Triage Notes (Signed)
Pt to the er for vomiting since Wednesday, sore throat since Saturday. Pt states no diarrhea today. Sharp pains to the abdomen. Pt denies hematuria but reports bloody stools.

## 2020-12-02 ENCOUNTER — Emergency Department
Admission: EM | Admit: 2020-12-02 | Discharge: 2020-12-02 | Disposition: A | Discharge status: Home / Self Care | Payer: Self-pay | Attending: Emergency Medicine | Admitting: Emergency Medicine

## 2020-12-02 ENCOUNTER — Other Ambulatory Visit: Payer: Self-pay

## 2020-12-02 ENCOUNTER — Encounter: Payer: Self-pay | Admitting: Emergency Medicine

## 2020-12-02 DIAGNOSIS — J45909 Unspecified asthma, uncomplicated: Secondary | ICD-10-CM | POA: Insufficient documentation

## 2020-12-02 DIAGNOSIS — R252 Cramp and spasm: Secondary | ICD-10-CM | POA: Insufficient documentation

## 2020-12-02 DIAGNOSIS — K922 Gastrointestinal hemorrhage, unspecified: Secondary | ICD-10-CM | POA: Insufficient documentation

## 2020-12-02 DIAGNOSIS — I10 Essential (primary) hypertension: Secondary | ICD-10-CM | POA: Insufficient documentation

## 2020-12-02 DIAGNOSIS — Z79899 Other long term (current) drug therapy: Secondary | ICD-10-CM | POA: Insufficient documentation

## 2020-12-02 DIAGNOSIS — K920 Hematemesis: Secondary | ICD-10-CM | POA: Insufficient documentation

## 2020-12-02 DIAGNOSIS — F17210 Nicotine dependence, cigarettes, uncomplicated: Secondary | ICD-10-CM | POA: Insufficient documentation

## 2020-12-02 LAB — CBC WITH DIFFERENTIAL/PLATELET
Abs Immature Granulocytes: 0.05 10*3/uL (ref 0.00–0.07)
Basophils Absolute: 0 10*3/uL (ref 0.0–0.1)
Basophils Relative: 1 %
Eosinophils Absolute: 0.1 10*3/uL (ref 0.0–0.5)
Eosinophils Relative: 1 %
HCT: 42.6 % (ref 39.0–52.0)
Hemoglobin: 14.2 g/dL (ref 13.0–17.0)
Immature Granulocytes: 1 %
Lymphocytes Relative: 13 %
Lymphs Abs: 0.8 10*3/uL (ref 0.7–4.0)
MCH: 29.8 pg (ref 26.0–34.0)
MCHC: 33.3 g/dL (ref 30.0–36.0)
MCV: 89.3 fL (ref 80.0–100.0)
Monocytes Absolute: 0.4 10*3/uL (ref 0.1–1.0)
Monocytes Relative: 7 %
Neutro Abs: 5 10*3/uL (ref 1.7–7.7)
Neutrophils Relative %: 77 %
Platelets: 267 10*3/uL (ref 150–400)
RBC: 4.77 MIL/uL (ref 4.22–5.81)
RDW: 13.1 % (ref 11.5–15.5)
WBC: 6.4 10*3/uL (ref 4.0–10.5)
nRBC: 0 % (ref 0.0–0.2)

## 2020-12-02 LAB — URINALYSIS, COMPLETE (UACMP) WITH MICROSCOPIC
Bacteria, UA: NONE SEEN
Bilirubin Urine: NEGATIVE
Glucose, UA: NEGATIVE mg/dL
Hgb urine dipstick: NEGATIVE
Ketones, ur: NEGATIVE mg/dL
Leukocytes,Ua: NEGATIVE
Nitrite: NEGATIVE
Protein, ur: NEGATIVE mg/dL
Specific Gravity, Urine: 1.01 (ref 1.005–1.030)
WBC, UA: NONE SEEN WBC/hpf (ref 0–5)
pH: 7 (ref 5.0–8.0)

## 2020-12-02 LAB — COMPREHENSIVE METABOLIC PANEL
ALT: 30 U/L (ref 0–44)
AST: 34 U/L (ref 15–41)
Albumin: 3.9 g/dL (ref 3.5–5.0)
Alkaline Phosphatase: 72 U/L (ref 38–126)
Anion gap: 9 (ref 5–15)
BUN: 5 mg/dL — ABNORMAL LOW (ref 6–20)
CO2: 24 mmol/L (ref 22–32)
Calcium: 8.5 mg/dL — ABNORMAL LOW (ref 8.9–10.3)
Chloride: 104 mmol/L (ref 98–111)
Creatinine, Ser: 0.76 mg/dL (ref 0.61–1.24)
GFR, Estimated: >60 mL/min (ref >60)
Glucose, Bld: 145 mg/dL — ABNORMAL HIGH (ref 70–99)
Potassium: 2.9 mmol/L — ABNORMAL LOW (ref 3.5–5.1)
Sodium: 137 mmol/L (ref 135–145)
Total Bilirubin: 0.5 mg/dL (ref 0.3–1.2)
Total Protein: 6.8 g/dL (ref 6.5–8.1)

## 2020-12-02 LAB — TYPE AND SCREEN
ABO/RH(D): O POS
Antibody Screen: NEGATIVE

## 2020-12-02 LAB — PROTIME-INR
INR: 0.9 (ref 0.8–1.2)
Prothrombin Time: 12.1 seconds (ref 11.4–15.2)

## 2020-12-02 LAB — LIPASE, BLOOD: Lipase: 37 U/L (ref 11–51)

## 2020-12-02 LAB — ETHANOL: Alcohol, Ethyl (B): 29 mg/dL — ABNORMAL HIGH (ref <10)

## 2020-12-02 MED ORDER — POTASSIUM CHLORIDE CRYS ER 20 MEQ PO TBCR
40.0000 meq | EXTENDED_RELEASE_TABLET | Freq: Once | ORAL | Status: AC
  Administered 2020-12-02: 40 meq via ORAL
  Filled 2020-12-02: qty 2

## 2020-12-02 MED ORDER — DICYCLOMINE HCL 10 MG PO CAPS: 10.0000 mg | ORAL_CAPSULE | Freq: Four times a day (QID) | ORAL | 0 refills | Status: DC

## 2020-12-02 MED ORDER — FAMOTIDINE IN NACL 20-0.9 MG/50ML-% IV SOLN
20.0000 mg | Freq: Once | INTRAVENOUS | Status: AC
  Administered 2020-12-02: 20 mg via INTRAVENOUS
  Filled 2020-12-02: qty 50

## 2020-12-02 MED ORDER — FAMOTIDINE 20 MG PO TABS: 20.0000 mg | ORAL_TABLET | Freq: Two times a day (BID) | ORAL | 0 refills | Status: DC

## 2020-12-02 MED ORDER — SUCRALFATE 1 G PO TABS: 1.0000 g | ORAL_TABLET | Freq: Four times a day (QID) | ORAL | 0 refills | Status: DC

## 2020-12-02 MED ORDER — SODIUM CHLORIDE 0.9 % IV BOLUS
1000.0000 mL | Freq: Once | INTRAVENOUS | Status: AC
  Administered 2020-12-02: 1000 mL via INTRAVENOUS

## 2020-12-02 NOTE — Discharge Instructions (Signed)
Please call and schedule an appointment with the GI specialist.  Return to the ER for symptoms that change or worsen or for new concerns if unable to see primary care or the specialist.

## 2020-12-02 NOTE — ED Triage Notes (Signed)
Patient ambulatory to triage with steady gait, without difficulty or distress noted; pt reports rectal bleeding and hematemesis for several days with lower abd pain, st hx of same

## 2020-12-02 NOTE — ED Provider Notes (Signed)
[facility] Emergency Department Provider Note ____________________________________________   Event Date/Time   First MD Initiated Contact with Patient 12/02/20 0740     (approximate)  I have reviewed the triage vital signs and the nursing notes.   HISTORY  Chief Complaint Rectal Bleeding  HPI Logan Lucas is a 45 y.o. male with history of asthma, hypertension, GI bleed presents to the emergency department for treatment and evaluation of 2 weeks of blood in the stool.  He states that this morning he had one episode of bloody emesis.  He denies frequent ibuprofen or NSAID use.  He has had these symptoms in the past but has not had a colonoscopy or EGD.  He does not currently have a GI specialist.  He denies fever.  States that just prior to bowel movement that his stomach cramps but otherwise denies abdominal pain..         Past Medical History:  Diagnosis Date  . Asthma   . GI bleed   . Hypertension     Patient Active Problem List   Diagnosis Date Noted  . Duodenitis 10/14/2019  . Chest pain 06/17/2018    Past Surgical History:  Procedure Laterality Date  . none      Prior to Admission medications   Medication Sig Start Date End Date Taking? Authorizing Provider  famotidine (PEPCID) 20 MG tablet Take 1 tablet (20 mg total) by mouth 2 (two) times daily for 14 days. 12/02/20 12/16/20 Yes Trevaris Pennella B, FNP  cyclobenzaprine (FLEXERIL) 5 MG tablet Take 1-2 tablets (5-10 mg total) by mouth 3 (three) times daily as needed for muscle spasms. 12/26/19   Duanne Guess, PA-C  dicyclomine (BENTYL) 10 MG capsule Take 1 capsule (10 mg total) by mouth 4 (four) times daily for 14 days. 12/02/20 12/16/20  Americus Perkey, Johnette Abraham B, FNP  metoprolol tartrate (LOPRESSOR) 25 MG tablet Take 0.5 tablets (12.5 mg total) by mouth 2 (two) times daily. 08/11/20 12/09/20  [name], MD  omeprazole (PRILOSEC) 20 MG capsule Take 1 capsule (20 mg total) by mouth daily for 14  days. 08/31/20 09/14/20  Duffy Bruce, MD  oxyCODONE (OXY IR/ROXICODONE) 5 MG immediate release tablet Take 1 tablet (5 mg total) by mouth every 4 (four) hours as needed for moderate pain. 10/17/19   Swayze, Ava, DO  prochlorperazine (COMPAZINE) 5 MG tablet Take 1 tablet (5 mg total) by mouth every 8 (eight) hours as needed (headache, nausea). 08/31/20   Duffy Bruce, MD  sucralfate (CARAFATE) 1 g tablet Take 1 tablet (1 g total) by mouth 4 (four) times daily for 5 days. 12/02/20 12/07/20  Cochise Dinneen, Johnette Abraham B, FNP  SUMAtriptan (IMITREX) 50 MG tablet Take 1 tablet (50 mg total) by mouth once as needed for migraine. May repeat in 2 hours if headache persists or recurs. 05/07/20 05/08/21  Cuthriell, Charline Bills, PA-C    Allergies Patient has no known allergies.  Family History  Problem Relation Age of Onset  . Diabetes Mother   . Cancer Father     Social History Social History   Tobacco Use  . Smoking status: Current Every Day Smoker    Packs/day: 1.00    Years: 5.00    Pack years: 5.00    Types: Cigarettes  . Smokeless tobacco: Never Used  Vaping Use  . Vaping Use: Never used  Substance Use Topics  . Alcohol use: Yes    Alcohol/week: 7.0 standard drinks    Types: 2 Cans of beer, 5  Shots of liquor per week    Comment: weekends  . Drug use: No    Frequency: 1.0 times per week    Review of Systems  Constitutional: No fever/chills.  Decreased appetite Eyes: No visual changes. ENT: No sore throat. Cardiovascular: Denies chest pain. Respiratory: Denies shortness of breath. Gastrointestinal: No abdominal pain.  No nausea, positive for vomiting.  No diarrhea.  No constipation.  Positive for blood in stools Genitourinary: Negative for dysuria. Musculoskeletal: Negative for back pain. Skin: Negative for rash. Neurological: Negative for headaches, focal weakness or numbness. ____________________________________________   PHYSICAL EXAM:  VITAL SIGNS: ED Triage Vitals  Enc Vitals  Group     BP 12/02/20 0614 (!) 167/101     Pulse Rate 12/02/20 0614 (!) 107     Resp 12/02/20 0614 18     Temp 12/02/20 0614 98.1 F (36.7 C)     Temp Source 12/02/20 0614 Oral     SpO2 12/02/20 0614 97 %     Weight 12/02/20 0615 245 lb (111.1 kg)     Height 12/02/20 0615 5\' 9"  (1.753 m)     Head Circumference --      Peak Flow --      Pain Score 12/02/20 0614 8     Pain Loc --      Pain Edu? --      Excl. in [place]? --     Constitutional: Alert and oriented. Well appearing and in no acute distress. Eyes: Conjunctivae are normal.  Head: Atraumatic. Nose: No congestion/rhinnorhea. Mouth/Throat: Mucous membranes are moist.  Oropharynx non-erythematous. Neck: No stridor.   Hematological/Lymphatic/Immunilogical: No cervical lymphadenopathy. Cardiovascular: Normal rate, regular rhythm. Grossly normal heart sounds.  Good peripheral circulation. Respiratory: Normal respiratory effort.  No retractions. Lungs CTAB. Gastrointestinal: Soft and nontender. No distention. No abdominal bruits. Genitourinary:  Musculoskeletal: No lower extremity tenderness nor edema.  No joint effusions. Neurologic:  Normal speech and language. No gross focal neurologic deficits are appreciated. No gait instability. Skin:  Skin is warm, dry and intact. No rash noted. Psychiatric: Mood and affect are normal. Speech and behavior are normal.  ____________________________________________   LABS (all labs ordered are listed, but only abnormal results are displayed)  Labs Reviewed  COMPREHENSIVE METABOLIC PANEL - Abnormal; Notable for the following components:      Result Value   Potassium 2.9 (*)    Glucose, Bld 145 (*)    BUN 5 (*)    Calcium 8.5 (*)    All other components within normal limits  URINALYSIS, COMPLETE (UACMP) WITH MICROSCOPIC - Abnormal; Notable for the following components:   Color, Urine YELLOW (*)    APPearance CLEAR (*)    All other components within normal limits  ETHANOL - Abnormal;  Notable for the following components:   Alcohol, Ethyl (B) 29 (*)    All other components within normal limits  CBC WITH DIFFERENTIAL/PLATELET  LIPASE, BLOOD  PROTIME-INR  TYPE AND SCREEN   ____________________________________________  EKG  Not indicated ____________________________________________  RADIOLOGY  ED MD interpretation:    Not indicated I, 01/30/21, personally viewed and evaluated these images (plain radiographs) as part of my medical decision making, as well as reviewing the written report by the radiologist.  Official radiology report(s): No results found.  ____________________________________________   PROCEDURES  Procedure(s) performed (including Critical Care):  Procedures  ____________________________________________   INITIAL IMPRESSION / ASSESSMENT AND PLAN     45 year old male presenting to the emergency department for treatment and evaluation of  blood in the stool and one episode of vomiting with blood.  He has had these symptoms in the past.  Plan will be to review labs, give fluids, give Pepcid, check a PT/INR, and review previous visits for the same symptoms.  DIFFERENTIAL DIAGNOSIS  Anemia, GI bleed,  ED COURSE  Previous abdominal CT was performed in October 2021 without any acute or concerning findings. Guiac positive. No bright red blood per rectum.   IV fluids and pepcid given while in the ER. He will be discharged with instruction to follow up with GI.  Prescriptions for Carafate, Bentyl, and Pepcid sent to the pharmacy.      ___________________________________________   FINAL CLINICAL IMPRESSION(S) / ED DIAGNOSES  Final diagnoses:  Gastrointestinal hemorrhage, unspecified gastrointestinal hemorrhage type     ED Discharge Orders         Ordered    dicyclomine (BENTYL) 10 MG capsule  4 times daily        12/02/20 0902    sucralfate (CARAFATE) 1 g tablet  4 times daily        12/02/20 0902    famotidine (PEPCID)  20 MG tablet  2 times daily        12/02/20 0902           BRYOR RAMI was evaluated in Emergency Department on 12/02/2020 for the symptoms described in the history of present illness. He was evaluated in the context of the global COVID-19 pandemic, which necessitated consideration that the patient might be at risk for infection with the SARS-CoV-2 virus that causes COVID-19. Institutional protocols and algorithms that pertain to the evaluation of patients at risk for COVID-19 are in a state of rapid change based on information released by regulatory bodies including the CDC and federal and state organizations. These policies and algorithms were followed during the patient's care in the ED.   Note:  This document was prepared using Dragon voice recognition software and may include unintentional dictation errors.   Victorino Dike, FNP 12/02/20 1449    [name], MD 12/02/20 1553

## 2020-12-02 NOTE — ED Notes (Signed)
Pt given sandwich tray and ice water. No concerns voiced at this time.

## 2021-01-22 ENCOUNTER — Other Ambulatory Visit: Payer: Self-pay

## 2021-01-22 DIAGNOSIS — K625 Hemorrhage of anus and rectum: Secondary | ICD-10-CM | POA: Diagnosis present

## 2021-01-22 DIAGNOSIS — D3500 Benign neoplasm of unspecified adrenal gland: Secondary | ICD-10-CM | POA: Diagnosis present

## 2021-01-22 DIAGNOSIS — K580 Irritable bowel syndrome with diarrhea: Secondary | ICD-10-CM | POA: Diagnosis present

## 2021-01-22 DIAGNOSIS — I110 Hypertensive heart disease with heart failure: Principal | ICD-10-CM | POA: Diagnosis present

## 2021-01-22 DIAGNOSIS — J45909 Unspecified asthma, uncomplicated: Secondary | ICD-10-CM | POA: Diagnosis present

## 2021-01-22 DIAGNOSIS — F17210 Nicotine dependence, cigarettes, uncomplicated: Secondary | ICD-10-CM | POA: Diagnosis present

## 2021-01-22 DIAGNOSIS — E119 Type 2 diabetes mellitus without complications: Secondary | ICD-10-CM | POA: Diagnosis present

## 2021-01-22 DIAGNOSIS — K2980 Duodenitis without bleeding: Secondary | ICD-10-CM | POA: Diagnosis present

## 2021-01-22 DIAGNOSIS — Z833 Family history of diabetes mellitus: Secondary | ICD-10-CM

## 2021-01-22 DIAGNOSIS — Z20822 Contact with and (suspected) exposure to covid-19: Secondary | ICD-10-CM | POA: Diagnosis present

## 2021-01-22 DIAGNOSIS — K760 Fatty (change of) liver, not elsewhere classified: Secondary | ICD-10-CM | POA: Diagnosis present

## 2021-01-22 DIAGNOSIS — Z79899 Other long term (current) drug therapy: Secondary | ICD-10-CM

## 2021-01-22 DIAGNOSIS — K219 Gastro-esophageal reflux disease without esophagitis: Secondary | ICD-10-CM | POA: Diagnosis present

## 2021-01-22 DIAGNOSIS — R0789 Other chest pain: Secondary | ICD-10-CM | POA: Diagnosis present

## 2021-01-22 DIAGNOSIS — I5031 Acute diastolic (congestive) heart failure: Secondary | ICD-10-CM | POA: Diagnosis present

## 2021-01-23 ENCOUNTER — Encounter: Payer: Self-pay | Admitting: Radiology

## 2021-01-23 ENCOUNTER — Inpatient Hospital Stay
Admission: EM | Admit: 2021-01-23 | Discharge: 2021-01-26 | DRG: 286 | Disposition: A | Discharge status: Home / Self Care | Payer: Self-pay | Attending: Obstetrics and Gynecology | Admitting: Obstetrics and Gynecology

## 2021-01-23 ENCOUNTER — Observation Stay (HOSPITAL_COMMUNITY)
Admit: 2021-01-23 | Discharge: 2021-01-23 | Disposition: A | Discharge status: Home / Self Care | Payer: Self-pay | Attending: Physician Assistant | Admitting: Physician Assistant

## 2021-01-23 ENCOUNTER — Emergency Department: Payer: Self-pay

## 2021-01-23 DIAGNOSIS — Z789 Other specified health status: Secondary | ICD-10-CM

## 2021-01-23 DIAGNOSIS — R7989 Other specified abnormal findings of blood chemistry: Secondary | ICD-10-CM | POA: Diagnosis present

## 2021-01-23 DIAGNOSIS — R778 Other specified abnormalities of plasma proteins: Secondary | ICD-10-CM | POA: Diagnosis present

## 2021-01-23 DIAGNOSIS — Z720 Tobacco use: Secondary | ICD-10-CM | POA: Diagnosis present

## 2021-01-23 DIAGNOSIS — R079 Chest pain, unspecified: Secondary | ICD-10-CM | POA: Diagnosis present

## 2021-01-23 DIAGNOSIS — F1090 Alcohol use, unspecified, uncomplicated: Secondary | ICD-10-CM

## 2021-01-23 DIAGNOSIS — F1020 Alcohol dependence, uncomplicated: Secondary | ICD-10-CM

## 2021-01-23 DIAGNOSIS — E119 Type 2 diabetes mellitus without complications: Secondary | ICD-10-CM

## 2021-01-23 DIAGNOSIS — K2980 Duodenitis without bleeding: Secondary | ICD-10-CM | POA: Diagnosis present

## 2021-01-23 DIAGNOSIS — F1010 Alcohol abuse, uncomplicated: Secondary | ICD-10-CM

## 2021-01-23 DIAGNOSIS — K625 Hemorrhage of anus and rectum: Secondary | ICD-10-CM | POA: Diagnosis present

## 2021-01-23 DIAGNOSIS — J45909 Unspecified asthma, uncomplicated: Secondary | ICD-10-CM | POA: Diagnosis present

## 2021-01-23 DIAGNOSIS — R9439 Abnormal result of other cardiovascular function study: Secondary | ICD-10-CM

## 2021-01-23 DIAGNOSIS — I340 Nonrheumatic mitral (valve) insufficiency: Secondary | ICD-10-CM

## 2021-01-23 DIAGNOSIS — F1410 Cocaine abuse, uncomplicated: Secondary | ICD-10-CM

## 2021-01-23 DIAGNOSIS — J4520 Mild intermittent asthma, uncomplicated: Secondary | ICD-10-CM

## 2021-01-23 DIAGNOSIS — E1165 Type 2 diabetes mellitus with hyperglycemia: Secondary | ICD-10-CM

## 2021-01-23 DIAGNOSIS — Z7289 Other problems related to lifestyle: Secondary | ICD-10-CM

## 2021-01-23 DIAGNOSIS — I10 Essential (primary) hypertension: Secondary | ICD-10-CM | POA: Diagnosis present

## 2021-01-23 LAB — CBC
HCT: 41.4 % (ref 39.0–52.0)
HCT: 41.6 % (ref 39.0–52.0)
HCT: 43.1 % (ref 39.0–52.0)
HCT: 46.8 % (ref 39.0–52.0)
Hemoglobin: 13.4 g/dL (ref 13.0–17.0)
Hemoglobin: 13.8 g/dL (ref 13.0–17.0)
Hemoglobin: 14 g/dL (ref 13.0–17.0)
Hemoglobin: 14.8 g/dL (ref 13.0–17.0)
MCH: 28.9 pg (ref 26.0–34.0)
MCH: 29 pg (ref 26.0–34.0)
MCH: 29.1 pg (ref 26.0–34.0)
MCH: 29.6 pg (ref 26.0–34.0)
MCHC: 31.6 g/dL (ref 30.0–36.0)
MCHC: 32.2 g/dL (ref 30.0–36.0)
MCHC: 32.5 g/dL (ref 30.0–36.0)
MCHC: 33.3 g/dL (ref 30.0–36.0)
MCV: 88.7 fL (ref 80.0–100.0)
MCV: 88.9 fL (ref 80.0–100.0)
MCV: 90.2 fL (ref 80.0–100.0)
MCV: 91.6 fL (ref 80.0–100.0)
Platelets: 250 10*3/uL (ref 150–400)
Platelets: 259 10*3/uL (ref 150–400)
Platelets: 260 10*3/uL (ref 150–400)
Platelets: 261 10*3/uL (ref 150–400)
RBC: 4.61 MIL/uL (ref 4.22–5.81)
RBC: 4.67 MIL/uL (ref 4.22–5.81)
RBC: 4.85 MIL/uL (ref 4.22–5.81)
RBC: 5.11 MIL/uL (ref 4.22–5.81)
RDW: 13.9 % (ref 11.5–15.5)
RDW: 13.9 % (ref 11.5–15.5)
RDW: 13.9 % (ref 11.5–15.5)
RDW: 14 % (ref 11.5–15.5)
WBC: 7.1 10*3/uL (ref 4.0–10.5)
WBC: 7.5 10*3/uL (ref 4.0–10.5)
WBC: 7.5 10*3/uL (ref 4.0–10.5)
WBC: 9.2 10*3/uL (ref 4.0–10.5)
nRBC: 0 % (ref 0.0–0.2)
nRBC: 0 % (ref 0.0–0.2)
nRBC: 0 % (ref 0.0–0.2)
nRBC: 0 % (ref 0.0–0.2)

## 2021-01-23 LAB — HIV ANTIBODY (ROUTINE TESTING W REFLEX): HIV Screen 4th Generation wRfx: NONREACTIVE

## 2021-01-23 LAB — COMPREHENSIVE METABOLIC PANEL
ALT: 18 U/L (ref 0–44)
AST: 22 U/L (ref 15–41)
Albumin: 3.6 g/dL (ref 3.5–5.0)
Alkaline Phosphatase: 74 U/L (ref 38–126)
Anion gap: 9 (ref 5–15)
BUN: 7 mg/dL (ref 6–20)
CO2: 19 mmol/L — ABNORMAL LOW (ref 22–32)
Calcium: 8.4 mg/dL — ABNORMAL LOW (ref 8.9–10.3)
Chloride: 107 mmol/L (ref 98–111)
Creatinine, Ser: 0.86 mg/dL (ref 0.61–1.24)
GFR, Estimated: >60 mL/min (ref >60)
Glucose, Bld: 132 mg/dL — ABNORMAL HIGH (ref 70–99)
Potassium: 3.9 mmol/L (ref 3.5–5.1)
Sodium: 135 mmol/L (ref 135–145)
Total Bilirubin: 0.7 mg/dL (ref 0.3–1.2)
Total Protein: 6.4 g/dL — ABNORMAL LOW (ref 6.5–8.1)

## 2021-01-23 LAB — ECHOCARDIOGRAM COMPLETE
AR max vel: 2.97 cm2
AV Area VTI: 2.92 cm2
AV Area mean vel: 2.96 cm2
AV Mean grad: 3 mmHg
AV Peak grad: 5.3 mmHg
Ao pk vel: 1.15 m/s
Area-P 1/2: 3.81 cm2
Height: 69 in
MV VTI: 2.91 cm2
S' Lateral: 3.8 cm
Weight: 3840 oz

## 2021-01-23 LAB — URINE DRUG SCREEN, QUALITATIVE (ARMC ONLY)
Amphetamines, Ur Screen: NOT DETECTED
Barbiturates, Ur Screen: NOT DETECTED
Benzodiazepine, Ur Scrn: NOT DETECTED
Cannabinoid 50 Ng, Ur ~~LOC~~: NOT DETECTED
Cocaine Metabolite,Ur ~~LOC~~: POSITIVE — AB
MDMA (Ecstasy)Ur Screen: NOT DETECTED
Methadone Scn, Ur: NOT DETECTED
Opiate, Ur Screen: NOT DETECTED
Phencyclidine (PCP) Ur S: NOT DETECTED
Tricyclic, Ur Screen: NOT DETECTED

## 2021-01-23 LAB — RESP PANEL BY RT-PCR (FLU A&B, COVID) ARPGX2
Influenza A by PCR: NEGATIVE
Influenza B by PCR: NEGATIVE
SARS Coronavirus 2 by RT PCR: NEGATIVE

## 2021-01-23 LAB — TROPONIN I (HIGH SENSITIVITY)
Troponin I (High Sensitivity): 19 ng/L — ABNORMAL HIGH (ref <18)
Troponin I (High Sensitivity): 22 ng/L — ABNORMAL HIGH (ref <18)
Troponin I (High Sensitivity): 18 ng/L — ABNORMAL HIGH (ref <18)
Troponin I (High Sensitivity): 15 ng/L (ref <18)
Troponin I (High Sensitivity): 14 ng/L (ref <18)

## 2021-01-23 MED ORDER — HYDRALAZINE HCL 20 MG/ML IJ SOLN: 5.0000 mg | INTRAMUSCULAR | Status: DC | PRN

## 2021-01-23 MED ORDER — THIAMINE HCL 100 MG/ML IJ SOLN: 100.0000 mg | Freq: Every day | INTRAMUSCULAR | Status: DC

## 2021-01-23 MED ORDER — FOLIC ACID 1 MG PO TABS
1.0000 mg | ORAL_TABLET | Freq: Every day | ORAL | Status: DC
  Administered 2021-01-23 – 2021-01-26 (×4): 1 mg via ORAL
  Filled 2021-01-23 (×4): qty 1

## 2021-01-23 MED ORDER — ASPIRIN EC 81 MG PO TBEC: 81.0000 mg | DELAYED_RELEASE_TABLET | Freq: Every day | ORAL | Status: DC

## 2021-01-23 MED ORDER — ENOXAPARIN SODIUM 60 MG/0.6ML ~~LOC~~ SOLN
0.5000 mg/kg | SUBCUTANEOUS | Status: DC
  Administered 2021-01-23 – 2021-01-24 (×2): 55 mg via SUBCUTANEOUS
  Filled 2021-01-23 (×3): qty 0.6

## 2021-01-23 MED ORDER — NICOTINE 21 MG/24HR TD PT24
21.0000 mg | MEDICATED_PATCH | Freq: Every day | TRANSDERMAL | Status: DC
  Administered 2021-01-23 – 2021-01-26 (×4): 21 mg via TRANSDERMAL
  Filled 2021-01-23 (×4): qty 1

## 2021-01-23 MED ORDER — MORPHINE SULFATE (PF) 2 MG/ML IV SOLN
2.0000 mg | INTRAVENOUS | Status: DC | PRN
  Administered 2021-01-23 – 2021-01-26 (×4): 2 mg via INTRAVENOUS
  Filled 2021-01-23 (×4): qty 1

## 2021-01-23 MED ORDER — ACETAMINOPHEN 325 MG PO TABS
650.0000 mg | ORAL_TABLET | Freq: Four times a day (QID) | ORAL | Status: DC | PRN
  Filled 2021-01-23: qty 2

## 2021-01-23 MED ORDER — LORAZEPAM 2 MG/ML IJ SOLN
0.0000 mg | Freq: Two times a day (BID) | INTRAMUSCULAR | Status: DC
  Administered 2021-01-26: 1 mg via INTRAVENOUS
  Filled 2021-01-23: qty 2
  Filled 2021-01-23: qty 1

## 2021-01-23 MED ORDER — ADULT MULTIVITAMIN W/MINERALS CH
1.0000 | ORAL_TABLET | Freq: Every day | ORAL | Status: DC
  Administered 2021-01-23 – 2021-01-26 (×4): 1 via ORAL
  Filled 2021-01-23 (×4): qty 1

## 2021-01-23 MED ORDER — PANTOPRAZOLE SODIUM 40 MG PO TBEC
40.0000 mg | DELAYED_RELEASE_TABLET | Freq: Two times a day (BID) | ORAL | Status: DC
  Administered 2021-01-23 – 2021-01-26 (×6): 40 mg via ORAL
  Filled 2021-01-23 (×6): qty 1

## 2021-01-23 MED ORDER — NITROGLYCERIN 0.4 MG SL SUBL: 0.4000 mg | SUBLINGUAL_TABLET | SUBLINGUAL | Status: DC | PRN

## 2021-01-23 MED ORDER — LORAZEPAM 2 MG/ML IJ SOLN
0.0000 mg | Freq: Four times a day (QID) | INTRAMUSCULAR | Status: AC
  Administered 2021-01-24 (×2): 1 mg via INTRAVENOUS
  Filled 2021-01-23 (×2): qty 1

## 2021-01-23 MED ORDER — SODIUM CHLORIDE 0.9 % IV SOLN: INTRAVENOUS | Status: DC

## 2021-01-23 MED ORDER — ONDANSETRON HCL 4 MG/2ML IJ SOLN: 4.0000 mg | Freq: Three times a day (TID) | INTRAMUSCULAR | Status: DC | PRN

## 2021-01-23 MED ORDER — THIAMINE HCL 100 MG PO TABS
100.0000 mg | ORAL_TABLET | Freq: Every day | ORAL | Status: DC
  Administered 2021-01-23 – 2021-01-26 (×4): 100 mg via ORAL
  Filled 2021-01-23 (×4): qty 1

## 2021-01-23 MED ORDER — METOPROLOL TARTRATE 25 MG PO TABS
12.5000 mg | ORAL_TABLET | Freq: Two times a day (BID) | ORAL | Status: DC
  Administered 2021-01-23: 12.5 mg via ORAL
  Filled 2021-01-23: qty 1

## 2021-01-23 MED ORDER — IOHEXOL 350 MG/ML SOLN
100.0000 mL | Freq: Once | INTRAVENOUS | Status: AC | PRN
  Administered 2021-01-23: 100 mL via INTRAVENOUS

## 2021-01-23 MED ORDER — LORAZEPAM 2 MG/ML IJ SOLN
1.0000 mg | INTRAMUSCULAR | Status: AC | PRN
  Administered 2021-01-23 – 2021-01-25 (×2): 2 mg via INTRAVENOUS
  Administered 2021-01-25: 1 mg via INTRAVENOUS
  Administered 2021-01-26: 2 mg via INTRAVENOUS
  Filled 2021-01-23 (×3): qty 1

## 2021-01-23 MED ORDER — LORAZEPAM 1 MG PO TABS
1.0000 mg | ORAL_TABLET | ORAL | Status: AC | PRN
  Administered 2021-01-23 – 2021-01-26 (×5): 1 mg via ORAL
  Filled 2021-01-23 (×2): qty 1
  Filled 2021-01-23: qty 2
  Filled 2021-01-23 (×2): qty 1

## 2021-01-23 MED ORDER — DM-GUAIFENESIN ER 30-600 MG PO TB12: 1.0000 | ORAL_TABLET | Freq: Two times a day (BID) | ORAL | Status: DC | PRN

## 2021-01-23 MED ORDER — SUCRALFATE 1 G PO TABS
1.0000 g | ORAL_TABLET | Freq: Four times a day (QID) | ORAL | Status: DC
  Administered 2021-01-23 – 2021-01-26 (×9): 1 g via ORAL
  Filled 2021-01-23 (×9): qty 1

## 2021-01-23 MED ORDER — ALBUTEROL SULFATE HFA 108 (90 BASE) MCG/ACT IN AERS
2.0000 | INHALATION_SPRAY | RESPIRATORY_TRACT | Status: DC | PRN
  Filled 2021-01-23: qty 6.7

## 2021-01-23 MED ORDER — ASPIRIN 81 MG PO CHEW
324.0000 mg | CHEWABLE_TABLET | Freq: Once | ORAL | Status: AC
  Administered 2021-01-23: 324 mg via ORAL
  Filled 2021-01-23: qty 4

## 2021-01-23 NOTE — ED Provider Notes (Signed)
[facility] Emergency Department Provider Note  ____________________________________________   Event Date/Time   First MD Initiated Contact with Patient 01/23/21 0121     (approximate)  I have reviewed the triage vital signs and the nursing notes.   HISTORY  Chief Complaint Chest Pain    HPI Logan Lucas is a 45 y.o. male with medical history as listed below who presents for evaluation of chest pain x3 days.  He reports that it is a sharp and pressure-like pain on the left side that feels worse than the chest pain episodes he has had in the past.  With exertion he feels more pain and some shortness of breath.  Nothing particular makes it better.  He said he has been admitted to the hospital in the past for chest pain work-up but they were not able to tell him what was wrong and this feels worse.  He denies abdominal pain and back pain.  He denies fever/chills, sore throat, nausea, vomiting, and dysuria.  He denies drug and alcohol use.  He also reports that he has pain in his right elbow but that it has been present for weeks or maybe months with no history of trauma and he does not think it is related.  He is able to move his elbow and use his arm without difficulty.         Past Medical History:  Diagnosis Date  . Asthma   . GI bleed   . Hypertension     Patient Active Problem List   Diagnosis Date Noted  . Duodenitis 10/14/2019  . Chest pain 06/17/2018    Past Surgical History:  Procedure Laterality Date  . none      Prior to Admission medications   Medication Sig Start Date End Date Taking? Authorizing Provider  cyclobenzaprine (FLEXERIL) 5 MG tablet Take 1-2 tablets (5-10 mg total) by mouth 3 (three) times daily as needed for muscle spasms. 12/26/19   Duanne Guess, PA-C  dicyclomine (BENTYL) 10 MG capsule Take 1 capsule (10 mg total) by mouth 4 (four) times daily for 14 days. 12/02/20 12/16/20  Triplett, Dessa Phi, FNP  famotidine  (PEPCID) 20 MG tablet Take 1 tablet (20 mg total) by mouth 2 (two) times daily for 14 days. 12/02/20 12/16/20  Triplett, Johnette Abraham B, FNP  metoprolol tartrate (LOPRESSOR) 25 MG tablet Take 0.5 tablets (12.5 mg total) by mouth 2 (two) times daily. 08/11/20 12/09/20  [name], MD  omeprazole (PRILOSEC) 20 MG capsule Take 1 capsule (20 mg total) by mouth daily for 14 days. 08/31/20 09/14/20  Duffy Bruce, MD  oxyCODONE (OXY IR/ROXICODONE) 5 MG immediate release tablet Take 1 tablet (5 mg total) by mouth every 4 (four) hours as needed for moderate pain. 10/17/19   Swayze, Ava, DO  prochlorperazine (COMPAZINE) 5 MG tablet Take 1 tablet (5 mg total) by mouth every 8 (eight) hours as needed (headache, nausea). 08/31/20   Duffy Bruce, MD  sucralfate (CARAFATE) 1 g tablet Take 1 tablet (1 g total) by mouth 4 (four) times daily for 5 days. 12/02/20 12/07/20  Triplett, Johnette Abraham B, FNP  SUMAtriptan (IMITREX) 50 MG tablet Take 1 tablet (50 mg total) by mouth once as needed for migraine. May repeat in 2 hours if headache persists or recurs. 05/07/20 05/08/21  Cuthriell, Charline Bills, PA-C    Allergies Patient has no known allergies.  Family History  Problem Relation Age of Onset  . Diabetes Mother   . Cancer Father  Social History Social History   Tobacco Use  . Smoking status: Current Every Day Smoker    Packs/day: 1.00    Years: 5.00    Pack years: 5.00    Types: Cigarettes  . Smokeless tobacco: Never Used  Vaping Use  . Vaping Use: Never used  Substance Use Topics  . Alcohol use: Yes    Alcohol/week: 7.0 standard drinks    Types: 2 Cans of beer, 5 Shots of liquor per week    Comment: weekends  . Drug use: No    Frequency: 1.0 times per week    Review of Systems Constitutional: No fever/chills Eyes: No visual changes. ENT: No sore throat. Cardiovascular: Chest pain as described above. Respiratory: Dyspnea with exertion. Gastrointestinal: No abdominal pain.  No nausea, no vomiting.  No  diarrhea.  No constipation. Genitourinary: Negative for dysuria. Musculoskeletal: Chronic right elbow pain.  Negative for neck pain.  Negative for back pain. Integumentary: Negative for rash. Neurological: Negative for headaches, focal weakness or numbness.   ____________________________________________   PHYSICAL EXAM:  VITAL SIGNS: ED Triage Vitals  Enc Vitals Group     BP 01/23/21 0012 (!) 145/93     Pulse Rate 01/23/21 0012 95     Resp 01/23/21 0012 16     Temp 01/23/21 0012 98.2 F (36.8 C)     Temp Source 01/23/21 0012 Oral     SpO2 01/23/21 0012 96 %     Weight 01/23/21 0013 108.9 kg (240 lb)     Height 01/23/21 0013 1.753 m (5\' 9" )     Head Circumference --      Peak Flow --      Pain Score 01/23/21 0012 8     Pain Loc --      Pain Edu? --      Excl. in [place]? --     Constitutional: Alert and oriented.  Watching TV, generally well-appearing. Eyes: Conjunctivae are normal.  Head: Atraumatic. Nose: No congestion/rhinnorhea. Mouth/Throat: Patient is wearing a mask. Neck: No stridor.  No meningeal signs.   Cardiovascular: Normal rate, regular rhythm. Good peripheral circulation. Respiratory: Normal respiratory effort.  No retractions. Gastrointestinal: Soft and nontender. No distention.  Musculoskeletal: No lower extremity tenderness nor edema. No gross deformities of extremities.  No effusion or erythema around the right elbow.  Normal flexion and extension.  No specific tenderness to palpation.  No chest wall tenderness to palpation. Neurologic:  Normal speech and language. No gross focal neurologic deficits are appreciated.  Skin:  Skin is warm, dry and intact. Psychiatric: Mood and affect are normal. Speech and behavior are normal.  ____________________________________________   LABS (all labs ordered are listed, but only abnormal results are displayed)  Labs Reviewed  CBC  COMPREHENSIVE METABOLIC PANEL  TROPONIN I (HIGH SENSITIVITY)  TROPONIN I (HIGH  SENSITIVITY)   ____________________________________________  EKG  ED ECG REPORT I, Hinda Kehr, the attending physician, personally viewed and interpreted this ECG.  Date: 01/22/2021 EKG Time: 23: 55 Rate: 95 Rhythm: normal sinus rhythm QRS Axis: normal Intervals: normal ST/T Wave abnormalities: T wave inversions in lead III and aVF as well as V5 and V6. Narrative Interpretation: T wave changes in inferior lateral leads.  However the morphology and the T wave changes are all the same as they were approximately 4 months ago.  ____________________________________________  RADIOLOGY I, Hinda Kehr, personally viewed and evaluated these images (plain radiographs) as part of my medical decision making, as well as reviewing the written report by  the radiologist.  ED MD interpretation: No acute abnormality on chest x-ray.  Official radiology report(s): DG Chest 2 View  Result Date: 01/23/2021 CLINICAL DATA:  Left-sided chest EXAM: CHEST - 2 VIEW COMPARISON:  August 11, 2020 FINDINGS: The heart size and mediastinal contours are within normal limits. Both lungs are clear. The visualized skeletal structures are unremarkable. IMPRESSION: No active cardiopulmonary disease. Electronically Signed   By: Prudencio Pair M.D.   On: 01/23/2021 00:37   CT Angio Chest/Abd/Pel for Dissection W and/or W/WO  Result Date: 01/23/2021 CLINICAL DATA:  45 year old male with chest pain for 3 days. EXAM: CT ANGIOGRAPHY CHEST, ABDOMEN AND PELVIS TECHNIQUE: Chest CTA 06/17/2018.  CT Abdomen and Pelvis 05/07/2020, 08/31/2020. Multidetector CT imaging through the chest, abdomen and pelvis was performed using the standard protocol during bolus administration of intravenous contrast. Multiplanar reconstructed images and MIPs were obtained and reviewed to evaluate the vascular anatomy. CONTRAST:  170mL OMNIPAQUE IOHEXOL 350 MG/ML SOLN COMPARISON:  CTA chest 06/17/2018. CT Abdomen and Pelvis 08/31/2020 and earlier.  FINDINGS: CTA CHEST FINDINGS Cardiovascular: Stable cardiac size at the upper limits of normal. No pericardial effusion. No calcified atherosclerosis in the chest. Negative thoracic aorta, no dissection or aneurysm. Central pulmonary arteries appear to be patent. Mediastinum/Nodes: Negative.  No lymphadenopathy. Lungs/Pleura: Major airways are patent. Lung volumes appear stable and within normal limits. Both lungs appear negative. No pleural effusion. Musculoskeletal: Stable, negative. Review of the MIP images confirms the above findings. CTA ABDOMEN AND PELVIS FINDINGS VASCULAR Negative for abdominal aortic aneurysm or dissection. Major arterial structures in the abdomen and pelvis are patent and appear normal aside from minimal atherosclerosis. Review of the MIP images confirms the above findings. NON-VASCULAR Hepatobiliary: Chronic severe back steatosis. Negative gallbladder. No bile duct enlargement. Pancreas: Negative. Spleen: Negative. Adrenals/Urinary Tract: Bilateral adrenal nodularity is stable since 2019 compatible with benign adrenal adenomas (series 5, image 100). Both kidneys appear stable and within normal limits, small left renal upper pole cyst. No perinephric stranding or hydronephrosis. Renal enhancement is within normal limits. Decompressed ureters. Unremarkable urinary bladder. Stomach/Bowel: No dilated or inflamed large or small bowel. Normal appendix (series 5, image 146). There is perhaps mildly increased fluid in the colon. Stomach and duodenum appear negative. No free air or free fluid. Lymphatic: No lymphadenopathy. Reproductive: Negative. Other: No pelvic free fluid. Musculoskeletal: Stable, negative. Review of the MIP images confirms the above findings. IMPRESSION: 1. Negative for aortic dissection or aneurysm. Minimal atherosclerosis. 2. No acute or inflammatory process identified in the chest, abdomen, and pelvis. 3. Chronic severe hepatic steatosis. 4. Chronic benign adrenal adenomas.  Electronically Signed   By: Genevie Ann M.D.   On: 01/23/2021 07:30    ____________________________________________   PROCEDURES   Procedure(s) performed (including Critical Care):  .1-3 Lead EKG Interpretation Performed by: Hinda Kehr, MD Authorized by: Hinda Kehr, MD     Interpretation: normal     ECG rate:  90   ECG rate assessment: normal     Rhythm: sinus rhythm     Ectopy: none     Conduction: normal       ____________________________________________   INITIAL IMPRESSION / MDM / ASSESSMENT AND PLAN / ED COURSE  As part of my medical decision making, I reviewed the following data within the [facility] notes reviewed and incorporated, Labs reviewed , EKG interpreted , Old chart reviewed and Notes from prior ED visits   Differential diagnosis includes, but is not limited to, ACS, AAS, PE,  pneumonia, musculoskeletal pain, angina.  The patient is on the cardiac monitor to evaluate for evidence of arrhythmia and/or significant heart rate changes.  EKG was initially questionable for ischemia but is unchanged from his prior EKG 4 months ago.  He has had pain for about 3 days and is also reporting some exertional dyspnea but his vital signs are stable other than some mild hypertension.  Afebrile, no tachycardia, SPO2 is 99 to 100%.  I personally reviewed the patient's imaging and agree with the radiologist's interpretation that there is no acute abnormality on chest x-ray.  Lab work is pending other than a CBC which was normal.  We will continue to monitor, evaluate for troponin, etc.  No indication of an emergent issue associate with his right elbow which seems to musculoskeletal pain.  Patient understands and agrees with the plan.  I will give a full dose aspirin.     Clinical Course as of 01/23/21 0835  Mon Jan 23, 2021  0341 Troponin I (High Sensitivity)(!): 19 Very slightly elevated troponin at 19.  Due to some delays in the lab results, it is  time for the 2-hour repeat sample.  Comprehensive metabolic panel is generally reassuring with a normal creatinine and electrolytes, though the CO2 is very slightly decreased.  CBC is normal with no leukocytosis. [CF]  5277 OEUM to reassess the patient and see he was feeling better and able to go home given his relatively reassuring results even though his troponin has increased slightly.  He has been resting but upon awakening he said he still feels the heaviness in the left center of his chest.  He also said that now it feels like it is radiating through his back and he is having some numbness in his left arm.  ACS is still possible (angina versus unstable angina), but AAS is also possible.  I think it is unlikely given that his blood pressure is relatively reasonable but given his constellation of symptoms and the fact that he had a prior admission for chest pain that was inconclusive makes me feel that it is appropriate to obtain a CTA chest/abdomen/pelvis to rule out acute aortic syndrome.  The patient said that he is quite worried about the chest pain because it is something new and different and worse than before and feels uncomfortable going home.  Given his slightly rising troponin which is greater than 20, I will admitted to the hospital but will first make sure there is no evidence of acute aortic syndrome. [CF]  0737 CT Angio Chest/Abd/Pel for Dissection W and/or W/WO No acute abnormalities identified on CTA chest/abd/pelvis.   [CF]  0740 We will proceed with plan for admission for chest pain evaluation rule out. [CF]    Clinical Course User Index [CF] Hinda Kehr, MD     ____________________________________________  FINAL CLINICAL IMPRESSION(S) / ED DIAGNOSES  Final diagnoses:  Chest pain, unspecified type  Elevated troponin level     MEDICATIONS GIVEN DURING THIS VISIT:  Medications - No data to display   ED Discharge Orders    None      *Please note:  Logan Lucas was evaluated in Emergency Department on 01/23/2021 for the symptoms described in the history of present illness. He was evaluated in the context of the global COVID-19 pandemic, which necessitated consideration that the patient might be at risk for infection with the SARS-CoV-2 virus that causes COVID-19. Institutional protocols and algorithms that pertain to the evaluation of patients at risk for  COVID-19 are in a state of rapid change based on information released by regulatory bodies including the CDC and federal and state organizations. These policies and algorithms were followed during the patient's care in the ED.  Some ED evaluations and interventions may be delayed as a result of limited staffing during and after the pandemic.*  Note:  This document was prepared using Dragon voice recognition software and may include unintentional dictation errors.   Hinda Kehr, MD 01/23/21 [phone]

## 2021-01-23 NOTE — ED Notes (Signed)
Charge rn notified of pt's acuity level.

## 2021-01-23 NOTE — ED Notes (Signed)
sleeping

## 2021-01-23 NOTE — Progress Notes (Signed)
*  PRELIMINARY RESULTS* Echocardiogram 2D Echocardiogram has been performed.  Logan Lucas 01/23/2021, 1:58 PM

## 2021-01-23 NOTE — Consult Note (Signed)
Consultation  Referring Provider:  Dr Blaine Hamper    Admit date 01/23/21 Consult date        01/23/21  Reason for Consultation:     Rectal bleeding, history of duodenitis         HPI:   Logan Lucas is a 45 y.o. male with htn/asthma who was admitted for left sided chest pain. Patient reports he came to ed with chest pain that radiates down into his left arm x 3-4d. Noted he has had some elevated troponin levels that have been down trending and his UDS was + for cocaine. States he continues to have chest pain- has had echo today. GI wise he reports he sometimes has luq pain/acid reflux and heartburn/rare nausea. Has a history of irregular bowel habits. Sometimes feels constipated and has had some runny stools with incomplete defecation. Denies any dysphagia, vomiting, melena. Rarely sees brpr. Feels like he may not hydrate well and sweats a lot at work. Sometimes takes goody's powders for headaches, endorses 2-3 shots vodka 2-3 times/w. Smokes cigarettes, endorses 2-3 alcohol shots 2-3 times/w. Has a diet fairly high in fried foods. States he is not taking any outpatient medications. Do note his hemoglobin is normal this visit. Liver enzymes and kidney function normal this visit. Lipase normal last month. Has had 4 ct's of the abdomen/pelvis since 11/20- first (10/14/19) exam with possible pancreatitis v duodenitis  6/21 exam with no acute findings- did demonstrate hepatic steatosis and some bilateral adrenal adenomas. 10/21 exam with severe hepatic steatosis and adrenal adenomas. Last exam this visit was CTA chest/abdomen/pelvis IMPRESSION: 1. Negative for aortic dissection or aneurysm. Minimal atherosclerosis. 2. No acute or inflammatory process identified in the chest, abdomen, and pelvis. 3. Chronic severe hepatic steatosis. 4. Chronic benign adrenal adenomas                        PREVIOUS ENDOSCOPIES:            None - although says he was going to have egd at one point but could not find  the time to do it   Past Medical History:  Diagnosis Date  . Asthma   . GI bleed   . Hypertension     Past Surgical History:  Procedure Laterality Date  . none      Family History  Problem Relation Age of Onset  . Diabetes Mother   . Cancer Father     Social History   Tobacco Use  . Smoking status: Current Every Day Smoker    Packs/day: 1.00    Years: 5.00    Pack years: 5.00    Types: Cigarettes  . Smokeless tobacco: Never Used  Vaping Use  . Vaping Use: Never used  Substance Use Topics  . Alcohol use: Yes    Alcohol/week: 7.0 standard drinks    Types: 2 Cans of beer, 5 Shots of liquor per week    Comment: weekends  . Drug use: No    Frequency: 1.0 times per week    Prior to Admission medications   Medication Sig Start Date End Date Taking? Authorizing Provider  cyclobenzaprine (FLEXERIL) 5 MG tablet Take 1-2 tablets (5-10 mg total) by mouth 3 (three) times daily as needed for muscle spasms. Patient not taking: Reported on 01/23/2021 12/26/19   Duanne Guess, PA-C  dicyclomine (BENTYL) 10 MG capsule Take 1 capsule (10 mg total) by mouth 4 (four) times daily for 14 days. 12/02/20 12/16/20  Triplett, Cari B, FNP  famotidine (PEPCID) 20 MG tablet Take 1 tablet (20 mg total) by mouth 2 (two) times daily for 14 days. 12/02/20 12/16/20  Triplett, Johnette Abraham B, FNP  metoprolol tartrate (LOPRESSOR) 25 MG tablet Take 0.5 tablets (12.5 mg total) by mouth 2 (two) times daily. 08/11/20 12/09/20  [name], MD  omeprazole (PRILOSEC) 20 MG capsule Take 1 capsule (20 mg total) by mouth daily for 14 days. 08/31/20 09/14/20  Duffy Bruce, MD  oxyCODONE (OXY IR/ROXICODONE) 5 MG immediate release tablet Take 1 tablet (5 mg total) by mouth every 4 (four) hours as needed for moderate pain. Patient not taking: Reported on 01/23/2021 10/17/19   Swayze, Ava, DO  prochlorperazine (COMPAZINE) 5 MG tablet Take 1 tablet (5 mg total) by mouth every 8 (eight) hours as needed (headache,  nausea). Patient not taking: Reported on 01/23/2021 08/31/20   Duffy Bruce, MD  sucralfate (CARAFATE) 1 g tablet Take 1 tablet (1 g total) by mouth 4 (four) times daily for 5 days. 12/02/20 12/07/20  Triplett, Johnette Abraham B, FNP  SUMAtriptan (IMITREX) 50 MG tablet Take 1 tablet (50 mg total) by mouth once as needed for migraine. May repeat in 2 hours if headache persists or recurs. Patient not taking: Reported on 01/23/2021 05/07/20 05/08/21  Cuthriell, Charline Bills, PA-C    Current Facility-Administered Medications  Medication Dose Route Frequency Provider Last Rate Last Admin  . 0.9 %  sodium chloride infusion   Intravenous Continuous Ivor Costa, MD 75 mL/hr at 01/23/21 0847 New Bag at 01/23/21 0847  . acetaminophen (TYLENOL) tablet 650 mg  650 mg Oral Q6H PRN Ivor Costa, MD      . albuterol (VENTOLIN HFA) 108 (90 Base) MCG/ACT inhaler 2 puff  2 puff Inhalation Q4H PRN Ivor Costa, MD      . dextromethorphan-guaiFENesin [facility] DM) 30-600 MG per 12 hr tablet 1 tablet  1 tablet Oral BID PRN Ivor Costa, MD      . folic acid (FOLVITE) tablet 1 mg  1 mg Oral Daily Ivor Costa, MD   1 mg at 01/23/21 1140  . hydrALAZINE (APRESOLINE) injection 5 mg  5 mg Intravenous Q2H PRN Ivor Costa, MD      . LORazepam (ATIVAN) injection 0-4 mg  0-4 mg Intravenous Q6H Ivor Costa, MD       Followed by  . [START ON 01/25/2021] LORazepam (ATIVAN) injection 0-4 mg  0-4 mg Intravenous Q12H Ivor Costa, MD      . LORazepam (ATIVAN) tablet 1-4 mg  1-4 mg Oral Q1H PRN Ivor Costa, MD       Or  . LORazepam (ATIVAN) injection 1-4 mg  1-4 mg Intravenous Q1H PRN Ivor Costa, MD      . morphine 2 MG/ML injection 2 mg  2 mg Intravenous Q4H PRN Ivor Costa, MD      . multivitamin with minerals tablet 1 tablet  1 tablet Oral Daily Ivor Costa, MD   1 tablet at 01/23/21 1141  . nicotine (NICODERM CQ - dosed in mg/24 hours) patch 21 mg  21 mg Transdermal Daily Ivor Costa, MD   21 mg at 01/23/21 1354  . nitroGLYCERIN (NITROSTAT) SL tablet 0.4 mg  0.4  mg Sublingual Q5 min PRN Ivor Costa, MD      . ondansetron [facility]) injection 4 mg  4 mg Intravenous Q8H PRN Ivor Costa, MD      . pantoprazole (PROTONIX) EC tablet 40 mg  40 mg Oral BID Ivor Costa, MD  40 mg at 01/23/21 1351  . thiamine tablet 100 mg  100 mg Oral Daily Ivor Costa, MD   100 mg at 01/23/21 1141   Or  . thiamine (B-1) injection 100 mg  100 mg Intravenous Daily Ivor Costa, MD       Current Outpatient Medications  Medication Sig Dispense Refill  . cyclobenzaprine (FLEXERIL) 5 MG tablet Take 1-2 tablets (5-10 mg total) by mouth 3 (three) times daily as needed for muscle spasms. (Patient not taking: Reported on 01/23/2021) 30 tablet 0  . dicyclomine (BENTYL) 10 MG capsule Take 1 capsule (10 mg total) by mouth 4 (four) times daily for 14 days. 56 capsule 0  . famotidine (PEPCID) 20 MG tablet Take 1 tablet (20 mg total) by mouth 2 (two) times daily for 14 days. 28 tablet 0  . metoprolol tartrate (LOPRESSOR) 25 MG tablet Take 0.5 tablets (12.5 mg total) by mouth 2 (two) times daily. 30 tablet 3  . omeprazole (PRILOSEC) 20 MG capsule Take 1 capsule (20 mg total) by mouth daily for 14 days. 14 capsule 0  . oxyCODONE (OXY IR/ROXICODONE) 5 MG immediate release tablet Take 1 tablet (5 mg total) by mouth every 4 (four) hours as needed for moderate pain. (Patient not taking: Reported on 01/23/2021) 20 tablet 0  . prochlorperazine (COMPAZINE) 5 MG tablet Take 1 tablet (5 mg total) by mouth every 8 (eight) hours as needed (headache, nausea). (Patient not taking: Reported on 01/23/2021) 15 tablet 0  . sucralfate (CARAFATE) 1 g tablet Take 1 tablet (1 g total) by mouth 4 (four) times daily for 5 days. 20 tablet 0  . SUMAtriptan (IMITREX) 50 MG tablet Take 1 tablet (50 mg total) by mouth once as needed for migraine. May repeat in 2 hours if headache persists or recurs. (Patient not taking: Reported on 01/23/2021) 14 tablet 2    Allergies as of 01/22/2021  . (No Known Allergies)     Review of  Systems:    All systems reviewed and negative except where noted in HPI.  Gen: Denies any fever, chills, sweats, anorexia, fatigue, weakness, malaise, weight loss, and sleep disorder CV: Denies chest pain, angina, palpitations, syncope, orthopnea, PND, peripheral edema, and claudication. Resp: Denies dyspnea at rest, dyspnea with exercise, cough, sputum, wheezing, coughing up blood, and pleurisy. GI: Denies vomiting blood, jaundice, and fecal incontinence.   Denies dysphagia or odynophagia. GU : Denies urinary burning, blood in urine, urinary frequency, urinary hesitancy, nocturnal urination, and urinary incontinence. MS: Denies joint pain, limitation of movement, and swelling, stiffness, low back pain, extremity pain. Denies muscle weakness, cramps, atrophy.  Derm: Denies rash, itching, dry skin, hives, moles, warts, or unhealing ulcers.  Psych: Denies depression, anxiety, memory loss, suicidal ideation, hallucinations, paranoia, and confusion. Heme: Denies bruising, bleeding, and enlarged lymph nodes. Neuro:  Denies any headaches, dizziness, paresthesias. Endo:  Denies any problems with DM, thyroid, adrenal function.    Physical Exam:  Vital signs in last 24 hours: Temp:  [98.2 F (36.8 C)] 98.2 F (36.8 C) (02/28 0012) Pulse Rate:  [73-95] 78 (02/28 1300) Resp:  [13-27] 15 (02/28 1300) BP: (115-160)/(68-107) 141/94 (02/28 1300) SpO2:  [91 %-100 %] 91 % (02/28 1300) Weight:  [108.9 kg] 108.9 kg (02/28 0013)   General:   Pleasant man in NAD Head:  Normocephalic and atraumatic. Eyes:   No icterus.   Conjunctiva pink. Ears:  Normal auditory acuity. Mouth: Mucosa pink moist, no lesions. Neck:  Supple; no masses felt Lungs:  Respirations  even and unlabored. Lungs clear to auscultation bilaterally.   No wheezes, crackles, or rhonchi.  Heart:  S1S2, RRR, no MRG. No edema. Abdomen:   Flat, soft, nondistended, nontender. Normal bowel sounds. No appreciable masses or hepatomegaly. No  rebound signs or other peritoneal signs. Rectal:  No obvious abnormalities. Stool brown, heme negative Msk:  MAEW x4, No clubbing or cyanosis. Strength 5/5. Symmetrical without gross deformities. Neurologic:  Alert and  oriented x4;  Cranial nerves II-XII intact.  Skin:  Warm, dry, pink without significant lesions or rashes. Psych:  Alert and cooperative. Normal affect.  LAB RESULTS: Recent Labs    01/23/21 0010 01/23/21 1147  WBC 9.2 7.5  HGB 14.8 13.8  HCT 46.8 41.4  PLT 260 250   BMET Recent Labs    01/23/21 0121  NA 135  K 3.9  CL 107  CO2 19*  GLUCOSE 132*  BUN 7  CREATININE 0.86  CALCIUM 8.4*   LFT Recent Labs    01/23/21 0121  PROT 6.4*  ALBUMIN 3.6  AST 22  ALT 18  ALKPHOS 74  BILITOT 0.7   PT/INR No results for input(s): LABPROT, INR in the last 72 hours.  STUDIES: DG Chest 2 View  Result Date: 01/23/2021 CLINICAL DATA:  Left-sided chest EXAM: CHEST - 2 VIEW COMPARISON:  August 11, 2020 FINDINGS: The heart size and mediastinal contours are within normal limits. Both lungs are clear. The visualized skeletal structures are unremarkable. IMPRESSION: No active cardiopulmonary disease. Electronically Signed   By: Prudencio Pair M.D.   On: 01/23/2021 00:37   CT Angio Chest/Abd/Pel for Dissection W and/or W/WO  Result Date: 01/23/2021 CLINICAL DATA:  45 year old male with chest pain for 3 days. EXAM: CT ANGIOGRAPHY CHEST, ABDOMEN AND PELVIS TECHNIQUE: Chest CTA 06/17/2018.  CT Abdomen and Pelvis 05/07/2020, 08/31/2020. Multidetector CT imaging through the chest, abdomen and pelvis was performed using the standard protocol during bolus administration of intravenous contrast. Multiplanar reconstructed images and MIPs were obtained and reviewed to evaluate the vascular anatomy. CONTRAST:  176mL OMNIPAQUE IOHEXOL 350 MG/ML SOLN COMPARISON:  CTA chest 06/17/2018. CT Abdomen and Pelvis 08/31/2020 and earlier. FINDINGS: CTA CHEST FINDINGS Cardiovascular: Stable cardiac  size at the upper limits of normal. No pericardial effusion. No calcified atherosclerosis in the chest. Negative thoracic aorta, no dissection or aneurysm. Central pulmonary arteries appear to be patent. Mediastinum/Nodes: Negative.  No lymphadenopathy. Lungs/Pleura: Major airways are patent. Lung volumes appear stable and within normal limits. Both lungs appear negative. No pleural effusion. Musculoskeletal: Stable, negative. Review of the MIP images confirms the above findings. CTA ABDOMEN AND PELVIS FINDINGS VASCULAR Negative for abdominal aortic aneurysm or dissection. Major arterial structures in the abdomen and pelvis are patent and appear normal aside from minimal atherosclerosis. Review of the MIP images confirms the above findings. NON-VASCULAR Hepatobiliary: Chronic severe back steatosis. Negative gallbladder. No bile duct enlargement. Pancreas: Negative. Spleen: Negative. Adrenals/Urinary Tract: Bilateral adrenal nodularity is stable since 2019 compatible with benign adrenal adenomas (series 5, image 100). Both kidneys appear stable and within normal limits, small left renal upper pole cyst. No perinephric stranding or hydronephrosis. Renal enhancement is within normal limits. Decompressed ureters. Unremarkable urinary bladder. Stomach/Bowel: No dilated or inflamed large or small bowel. Normal appendix (series 5, image 146). There is perhaps mildly increased fluid in the colon. Stomach and duodenum appear negative. No free air or free fluid. Lymphatic: No lymphadenopathy. Reproductive: Negative. Other: No pelvic free fluid. Musculoskeletal: Stable, negative. Review of the MIP images confirms the  above findings. IMPRESSION: 1. Negative for aortic dissection or aneurysm. Minimal atherosclerosis. 2. No acute or inflammatory process identified in the chest, abdomen, and pelvis. 3. Chronic severe hepatic steatosis. 4. Chronic benign adrenal adenomas. Electronically Signed   By: Genevie Ann M.D.   On: 01/23/2021  07:30       Impression / Plan:   1. Intermittnet luq pain/gerd- agree with bid ppi. Will assess stool for h pylori and recommend avoiding illicits/etoh/nsaids.  2. Irregular bowel habits- likely IBS. Adequate hydration. Improve diet 3. Intermittent brpr- heme negative today, no obvious lesions.    *Reviewed patient with Dr Haig Prophet- no need for urgent GI eval due to chest pain issues and cocaine- but should have o/p GI follow up to discuss further eval/endoscopies.   Thank you very much for this consult. These services were provided by Stephens November, NP-C, in collaboration with Lollie Sails, MD, with whom I have discussed this patient in full.   Stephens November, NP-C

## 2021-01-23 NOTE — ED Notes (Signed)
Patient transported to CT 

## 2021-01-23 NOTE — ED Notes (Signed)
Pt states coming in for left sided chest pain that radiates down the arm. Pt states right elbow pain as well.

## 2021-01-23 NOTE — ED Notes (Signed)
Lab called requesting redraw of light green tube.

## 2021-01-23 NOTE — ED Notes (Signed)
Lab called at this time regarding troponin/cmp results. Reports it is on the machine now and running.

## 2021-01-23 NOTE — ED Notes (Signed)
Provider at bedside

## 2021-01-23 NOTE — H&P (Addendum)
History and Physical    Logan Lucas HWE:[id number] DOB: 22-Mar-1976 DOA: 01/23/2021  Referring MD/NP/PA:   PCP: Patient, No Pcp Per   Patient coming from:  The patient is coming from home.  At baseline, pt is independent for most of ADL.        Chief Complaint: chest pain  HPI: Logan Lucas is a 45 y.o. male with medical history significant of HTN, asthma, duodenitis, tobacco abuse, alcohol use, who presents with chest pain.   Pt states that he has been having chest pain in the past 3 days, which is located in the left side of the chest, sharp and pressure-like, radiating to the left arm, 8 out of 10 severity. It is associated with mild shortness breath. He has mild cough. No fever or chills.  He said he has been admitted to the hospital in the past for chest pain work-up but they were not able to tell him what was wrong. Patient states that he had mild diarrhea yesterday which has resolved.  Currently no nausea, vomiting, diarrhea or abdominal pain.  No symptoms of UTI. He also reports that he has pain in his right elbow but that it has been present for weeks or maybe months with no history of trauma and he does not think it is related.  He is able to move his elbow and use his arm without difficulty. Patient states that he drinks vodka, 2 or 3 times each week, 2-3 shots each time. He reports a history of bright red blood in the toilet.  He states that he has noticed bright red blood recently, as well as dark blood in the toilet.  He is uncertain if he has had any melena.   ED Course: pt was found to have troponin level 19, 22, pending COVID-19 PCR, electrolytes renal function okay, temperature normal, blood pressure 145/107, heart rate 95, 77, RR 27, 24, oxygen saturation 95% on room air, chest x-ray negative.  CT angiogram is negative for dissection.  Patient is placed on progressive bed for observation.  Dr. Fletcher Anon of cardiology is consulted.  CTA of chest/abd/pelvis 1.  Negative for aortic dissection or aneurysm. Minimal atherosclerosis. 2. No acute or inflammatory process identified in the chest, abdomen, and pelvis. 3. Chronic severe hepatic steatosis. 4. Chronic benign adrenal adenomas.  Review of Systems:   General: no fevers, chills, no body weight gain, fatigue HEENT: no blurry vision, hearing changes or sore throat Respiratory: has dyspnea, no coughing, wheezing CV: has chest pain, no palpitations GI: no nausea, vomiting, abdominal pain, diarrhea, constipation. Has dark stool. GU: no dysuria, burning on urination, increased urinary frequency, hematuria  Ext: no leg edema Neuro: no unilateral weakness, numbness, or tingling, no vision change or hearing loss Skin: no rash, no skin tear. MSK: has right arm pain Heme: No easy bruising.  Travel history: No recent long distant travel.  Allergy: No Known Allergies  Past Medical History:  Diagnosis Date  . Asthma   . GI bleed   . Hypertension     Past Surgical History:  Procedure Laterality Date  . none      Social History:  reports that he has been smoking cigarettes. He has a 5.00 pack-year smoking history. He has never used smokeless tobacco. He reports current alcohol use of about 7.0 standard drinks of alcohol per week. He reports that he does not use drugs.  Family History:  Family History  Problem Relation Age of Onset  . Diabetes Mother   .  Cancer Father      Prior to Admission medications   Medication Sig Start Date End Date Taking? Authorizing Provider  cyclobenzaprine (FLEXERIL) 5 MG tablet Take 1-2 tablets (5-10 mg total) by mouth 3 (three) times daily as needed for muscle spasms. 12/26/19   Duanne Guess, PA-C  dicyclomine (BENTYL) 10 MG capsule Take 1 capsule (10 mg total) by mouth 4 (four) times daily for 14 days. 12/02/20 12/16/20  Triplett, Dessa Phi, FNP  famotidine (PEPCID) 20 MG tablet Take 1 tablet (20 mg total) by mouth 2 (two) times daily for 14 days. 12/02/20 12/16/20   Triplett, Johnette Abraham B, FNP  metoprolol tartrate (LOPRESSOR) 25 MG tablet Take 0.5 tablets (12.5 mg total) by mouth 2 (two) times daily. 08/11/20 12/09/20  [name], MD  omeprazole (PRILOSEC) 20 MG capsule Take 1 capsule (20 mg total) by mouth daily for 14 days. 08/31/20 09/14/20  Duffy Bruce, MD  oxyCODONE (OXY IR/ROXICODONE) 5 MG immediate release tablet Take 1 tablet (5 mg total) by mouth every 4 (four) hours as needed for moderate pain. 10/17/19   Swayze, Ava, DO  prochlorperazine (COMPAZINE) 5 MG tablet Take 1 tablet (5 mg total) by mouth every 8 (eight) hours as needed (headache, nausea). 08/31/20   Duffy Bruce, MD  sucralfate (CARAFATE) 1 g tablet Take 1 tablet (1 g total) by mouth 4 (four) times daily for 5 days. 12/02/20 12/07/20  Triplett, Johnette Abraham B, FNP  SUMAtriptan (IMITREX) 50 MG tablet Take 1 tablet (50 mg total) by mouth once as needed for migraine. May repeat in 2 hours if headache persists or recurs. 05/07/20 05/08/21  Cuthriell, Charline Bills, PA-C    Physical Exam: Vitals:   01/23/21 0800 01/23/21 0830 01/23/21 0900 01/23/21 0930  BP: (!) 160/97 (!) 141/88 (!) 131/93 (!) 132/92  Pulse: 80 74 78 82  Resp: 17 (!) 22 17 (!) 21  Temp:      TempSrc:      SpO2: 97% 95% 100% 99%  Weight:      Height:       General: Not in acute distress HEENT:       Eyes: PERRL, EOMI, no scleral icterus.       ENT: No discharge from the ears and nose, no pharynx injection, no tonsillar enlargement.        Neck: No JVD, no bruit, no mass felt. Heme: No neck lymph node enlargement. Cardiac: S1/S2, RRR, No murmurs, No gallops or rubs. Respiratory: No rales, wheezing, rhonchi or rubs. GI: Soft, nondistended, nontender, no rebound pain, no organomegaly, BS present. GU: No hematuria Ext: No pitting leg edema bilaterally. 1+DP/PT pulse bilaterally. Musculoskeletal: has right arm pain without any deformity Skin: No rashes.  Neuro: Alert, oriented X3, cranial nerves II-XII grossly intact, moves all  extremities normally. Psych: Patient is not psychotic, no suicidal or hemocidal ideation.  Labs on Admission: I have personally reviewed following labs and imaging studies  CBC: Recent Labs  Lab 01/23/21 0010  WBC 9.2  HGB 14.8  HCT 46.8  MCV 91.6  PLT 564   Basic Metabolic Panel: Recent Labs  Lab 01/23/21 0121  NA 135  K 3.9  CL 107  CO2 19*  GLUCOSE 132*  BUN 7  CREATININE 0.86  CALCIUM 8.4*   GFR: Estimated Creatinine Clearance: 136.1 mL/min (by C-G formula based on SCr of 0.86 mg/dL). Liver Function Tests: Recent Labs  Lab 01/23/21 0121  AST 22  ALT 18  ALKPHOS 74  BILITOT 0.7  PROT 6.4*  ALBUMIN 3.6   No results for input(s): LIPASE, AMYLASE in the last 168 hours. No results for input(s): AMMONIA in the last 168 hours. Coagulation Profile: No results for input(s): INR, PROTIME in the last 168 hours. Cardiac Enzymes: No results for input(s): CKTOTAL, CKMB, CKMBINDEX, TROPONINI in the last 168 hours. BNP (last 3 results) No results for input(s): PROBNP in the last 8760 hours. HbA1C: No results for input(s): HGBA1C in the last 72 hours. CBG: No results for input(s): GLUCAP in the last 168 hours. Lipid Profile: No results for input(s): CHOL, HDL, LDLCALC, TRIG, CHOLHDL, LDLDIRECT in the last 72 hours. Thyroid Function Tests: No results for input(s): TSH, T4TOTAL, FREET4, T3FREE, THYROIDAB in the last 72 hours. Anemia Panel: No results for input(s): VITAMINB12, FOLATE, FERRITIN, TIBC, IRON, RETICCTPCT in the last 72 hours. Urine analysis:    Component Value Date/Time   COLORURINE YELLOW (A) 12/02/2020 0617   APPEARANCEUR CLEAR (A) 12/02/2020 0617   APPEARANCEUR Hazy 02/10/2014 0521   LABSPEC 1.010 12/02/2020 0617   LABSPEC 1.024 02/10/2014 0521   PHURINE 7.0 12/02/2020 0617   GLUCOSEU NEGATIVE 12/02/2020 0617   GLUCOSEU Negative 02/10/2014 0521   HGBUR NEGATIVE 12/02/2020 0617   BILIRUBINUR NEGATIVE 12/02/2020 0617   BILIRUBINUR Negative  02/10/2014 0521   KETONESUR NEGATIVE 12/02/2020 0617   PROTEINUR NEGATIVE 12/02/2020 0617   NITRITE NEGATIVE 12/02/2020 0617   LEUKOCYTESUR NEGATIVE 12/02/2020 0617   LEUKOCYTESUR Negative 02/10/2014 0521   Sepsis Labs: @LABRCNTIP (procalcitonin:4,lacticidven:4) ) Recent Results (from the past 240 hour(s))  Resp Panel by RT-PCR (Flu A&B, Covid) Nasopharyngeal Swab     Status: None   Collection Time: 01/23/21  8:04 AM   Specimen: Nasopharyngeal Swab; Nasopharyngeal(NP) swabs in vial transport medium  Result Value Ref Range Status   SARS Coronavirus 2 by RT PCR NEGATIVE NEGATIVE Final    Comment: (NOTE) SARS-CoV-2 target nucleic acids are NOT DETECTED.  The SARS-CoV-2 RNA is generally detectable in upper respiratory specimens during the acute phase of infection. The lowest concentration of SARS-CoV-2 viral copies this assay can detect is 138 copies/mL. A negative result does not preclude SARS-Cov-2 infection and should not be used as the sole basis for treatment or other patient management decisions. A negative result may occur with  improper specimen collection/handling, submission of specimen other than nasopharyngeal swab, presence of viral mutation(s) within the areas targeted by this assay, and inadequate number of viral copies(<138 copies/mL). A negative result must be combined with clinical observations, patient history, and epidemiological information. The expected result is Negative.  Fact Sheet for Patients:  EntrepreneurPulse.com.au  Fact Sheet for Healthcare Providers:  IncredibleEmployment.be  This test is no t yet approved or cleared by the [place] FDA and  has been authorized for detection and/or diagnosis of SARS-CoV-2 by FDA under an Emergency Use Authorization (EUA). This EUA will remain  in effect (meaning this test can be used) for the duration of the COVID-19 declaration under Section 564(b)(1) of the Act,  21 U.S.C.section 360bbb-3(b)(1), unless the authorization is terminated  or revoked sooner.       Influenza A by PCR NEGATIVE NEGATIVE Final   Influenza B by PCR NEGATIVE NEGATIVE Final    Comment: (NOTE) The Xpert Xpress SARS-CoV-2/FLU/RSV plus assay is intended as an aid in the diagnosis of influenza from Nasopharyngeal swab specimens and should not be used as a sole basis for treatment. Nasal washings and aspirates are unacceptable for Xpert Xpress SARS-CoV-2/FLU/RSV testing.  Fact Sheet for Patients: EntrepreneurPulse.com.au  Fact Sheet for  Healthcare Providers: IncredibleEmployment.be  This test is not yet approved or cleared by the [place] and has been authorized for detection and/or diagnosis of SARS-CoV-2 by FDA under an Emergency Use Authorization (EUA). This EUA will remain in effect (meaning this test can be used) for the duration of the COVID-19 declaration under Section 564(b)(1) of the Act, 21 U.S.C. section 360bbb-3(b)(1), unless the authorization is terminated or revoked.  Performed at [facility], [address] [place], [place] [address]      Radiological Exams on Admission: DG Chest 2 View  Result Date: 01/23/2021 CLINICAL DATA:  Left-sided chest EXAM: CHEST - 2 VIEW COMPARISON:  August 11, 2020 FINDINGS: The heart size and mediastinal contours are within normal limits. Both lungs are clear. The visualized skeletal structures are unremarkable. IMPRESSION: No active cardiopulmonary disease. Electronically Signed   By: Prudencio Pair M.D.   On: 01/23/2021 00:37   CT Angio Chest/Abd/Pel for Dissection W and/or W/WO  Result Date: 01/23/2021 CLINICAL DATA:  45 year old male with chest pain for 3 days. EXAM: CT ANGIOGRAPHY CHEST, ABDOMEN AND PELVIS TECHNIQUE: Chest CTA 06/17/2018.  CT Abdomen and Pelvis 05/07/2020, 08/31/2020. Multidetector CT imaging through the chest, abdomen and pelvis was performed  using the standard protocol during bolus administration of intravenous contrast. Multiplanar reconstructed images and MIPs were obtained and reviewed to evaluate the vascular anatomy. CONTRAST:  142mL OMNIPAQUE IOHEXOL 350 MG/ML SOLN COMPARISON:  CTA chest 06/17/2018. CT Abdomen and Pelvis 08/31/2020 and earlier. FINDINGS: CTA CHEST FINDINGS Cardiovascular: Stable cardiac size at the upper limits of normal. No pericardial effusion. No calcified atherosclerosis in the chest. Negative thoracic aorta, no dissection or aneurysm. Central pulmonary arteries appear to be patent. Mediastinum/Nodes: Negative.  No lymphadenopathy. Lungs/Pleura: Major airways are patent. Lung volumes appear stable and within normal limits. Both lungs appear negative. No pleural effusion. Musculoskeletal: Stable, negative. Review of the MIP images confirms the above findings. CTA ABDOMEN AND PELVIS FINDINGS VASCULAR Negative for abdominal aortic aneurysm or dissection. Major arterial structures in the abdomen and pelvis are patent and appear normal aside from minimal atherosclerosis. Review of the MIP images confirms the above findings. NON-VASCULAR Hepatobiliary: Chronic severe back steatosis. Negative gallbladder. No bile duct enlargement. Pancreas: Negative. Spleen: Negative. Adrenals/Urinary Tract: Bilateral adrenal nodularity is stable since 2019 compatible with benign adrenal adenomas (series 5, image 100). Both kidneys appear stable and within normal limits, small left renal upper pole cyst. No perinephric stranding or hydronephrosis. Renal enhancement is within normal limits. Decompressed ureters. Unremarkable urinary bladder. Stomach/Bowel: No dilated or inflamed large or small bowel. Normal appendix (series 5, image 146). There is perhaps mildly increased fluid in the colon. Stomach and duodenum appear negative. No free air or free fluid. Lymphatic: No lymphadenopathy. Reproductive: Negative. Other: No pelvic free fluid.  Musculoskeletal: Stable, negative. Review of the MIP images confirms the above findings. IMPRESSION: 1. Negative for aortic dissection or aneurysm. Minimal atherosclerosis. 2. No acute or inflammatory process identified in the chest, abdomen, and pelvis. 3. Chronic severe hepatic steatosis. 4. Chronic benign adrenal adenomas. Electronically Signed   By: Genevie Ann M.D.   On: 01/23/2021 07:30     EKG: I have personally reviewed.  Sinus rhythm, QTC 457, LAE, T wave inversion in inferior leads and V5-V6  Assessment/Plan Principal Problem:   Chest pain Active Problems:   Duodenitis   Asthma   Hypertension   Elevated troponin   Tobacco abuse   Alcohol use   Rectal bleeding   Chest pain and  elevated trop:  Trop 19 -->22. Dr. Fletcher Anon of card is consulted - Place in progressive unit for obs - Trend Trop - Repeat EKG in the am  - prn Nitroglycerin, Morphine - hold off aspirin due to dark stool (pt received 324 mg of ASA in ED) - Risk factor stratification: will check FLP and A1C  - check UDS  Duodenitis and rectal bleeding: Hgb is 14.8.  Dr. Haig Prophet of GI is consulted -f/u GI recommendations -protonix 40 mg bid oraly -carafate -CBC q6h  Asthma: stable -prn albuterol and mucinex  Hypertension -IV hydralazine -Metoprolol  Tobacco abuse and alcohol use -Nicotine patch -CiWA      DVT ppx: SQ Lovenox Code Status: Full code Family Communication: not done, no family member is at bed side.    Disposition Plan:  Anticipate discharge back to previous environment Consults called: Dr. Fletcher Anon of cardiology Admission status and Level of care: Progressive Cardiac:  for obs    Status is: Observation  The patient remains OBS appropriate and will d/c before 2 midnights.  Dispo: The patient is from: Home              Anticipated d/c is to: Home              Patient currently is not medically stable to d/c.   Difficult to place patient No          Date of Service 01/23/2021       [facility]   If 7PM-7AM, please contact night-coverage [email or web address] 01/23/2021, 10:51 AM

## 2021-01-23 NOTE — Consult Note (Signed)
Cardiology Consultation:   Patient ID: Logan Lucas MRN: [id number]; DOB: Dec 28, 1975  Admit date: 01/23/2021 Date of Consult: 01/23/2021  PCP:  Patient, No Pcp Per   [facility]  Cardiologist: Iverson Alamin, Dr. Fletcher Anon Advanced Practice Provider:  No care team member to display Electrophysiologist:  None [id number]}    Patient Profile:   Logan Lucas is a 45 y.o. male with a hx of current tobacco use and 1/2 pack/day, current marijuana use, current alcohol use, asthma, hypertension, h/o GI bleed, who is being seen today for the evaluation of chest pain at the request of Dr. Blaine Hamper.  History of Present Illness:   Mr. [name] is a 45 year old male with no known history of CAD.  He has history of previously reported chest pain with work-up unremarkable.  He works at [employer].   He reports current tobacco use at 1/2 pack/day.  He reports current marijuana use and smokes 1 time per month.  He reports alcohol use on the weekends with vodka and amount of alcohol consumed varying between 1 and many shots. He drinks 16 oz of soda daily. He also drinks a lot of juice.   05/2018 echo with EF 55-60, NR WMA.  He reports a history of bright red blood in the toilet.  He states that he has noticed bright red blood recently, as well as dark blood in the toilet.  He is uncertain if he has had any melena.  He does report diarrhea.  Diarrhea is red in color.  He denies any constipation.  He denies any fever but reports chills.  On 01/23/2021, he presented to [facility] with report of left-sided chest pain for 3 days.  He reports the chest pain started 3 days ago and while watching television.  He had not recently had anything to eat/denies any association with food.  Chest pain was described as sharp and pressure-like, constant, and rated 7/10 in severity.  He initially thought this CP was 2/2 indigestion and thus tried Pepcid AC and ginger ale without alleviation.  He attempted to rub  the area without relief of chest discomfort.  He reports that the chest pain then started to shoot down his left arm.  He also noted associated palpitations and felt short of breath during these palpitations.  He reports that he tried to go to work at [employer] and continued to feel chest pain and palpitations with shortness of breath.  He also noted periodic diaphoresis.  He notes one incident where he felt as if he may pass out and his vision started to darken but he did not pass out or fall.  He decided to present to the emergency department with ongoing chest pain.  In the ED, initial vitals significant for BP 145/93, HR 95 bpm.  CT negative for aortic dissection or aneurysm and showing chronic severe hepatic steatosis.  Chronic benign adrenal adenomas noted.  EKG with TWI unchanged from previous and without acute ST/T changes.  High-sensitivity troponin 19  22.  Renal function stable.  LFTs normal.  Electrolytes at goal.  COVID-19 status pending. At the time of cardiac consultation, he notes ongoing chest pain.  During exam, chest pain is tender to palpation.  He also notes pain with coughing and laughing during his exam.  During his abdominal exam, he reports pain with palpation on the left side of his abdomen.   Past Medical History:  Diagnosis Date  . Asthma   . GI bleed   .  Hypertension     Past Surgical History:  Procedure Laterality Date  . none       Home Medications:  Prior to Admission medications   Medication Sig Start Date End Date Taking? Authorizing Provider  cyclobenzaprine (FLEXERIL) 5 MG tablet Take 1-2 tablets (5-10 mg total) by mouth 3 (three) times daily as needed for muscle spasms. Patient not taking: Reported on 01/23/2021 12/26/19   Duanne Guess, PA-C  dicyclomine (BENTYL) 10 MG capsule Take 1 capsule (10 mg total) by mouth 4 (four) times daily for 14 days. 12/02/20 12/16/20  Triplett, Dessa Phi, FNP  famotidine (PEPCID) 20 MG tablet Take 1 tablet (20 mg total) by mouth  2 (two) times daily for 14 days. 12/02/20 12/16/20  Triplett, Johnette Abraham B, FNP  metoprolol tartrate (LOPRESSOR) 25 MG tablet Take 0.5 tablets (12.5 mg total) by mouth 2 (two) times daily. 08/11/20 12/09/20  [name], MD  omeprazole (PRILOSEC) 20 MG capsule Take 1 capsule (20 mg total) by mouth daily for 14 days. 08/31/20 09/14/20  Duffy Bruce, MD  oxyCODONE (OXY IR/ROXICODONE) 5 MG immediate release tablet Take 1 tablet (5 mg total) by mouth every 4 (four) hours as needed for moderate pain. Patient not taking: Reported on 01/23/2021 10/17/19   Swayze, Ava, DO  prochlorperazine (COMPAZINE) 5 MG tablet Take 1 tablet (5 mg total) by mouth every 8 (eight) hours as needed (headache, nausea). Patient not taking: Reported on 01/23/2021 08/31/20   Duffy Bruce, MD  sucralfate (CARAFATE) 1 g tablet Take 1 tablet (1 g total) by mouth 4 (four) times daily for 5 days. 12/02/20 12/07/20  Triplett, Johnette Abraham B, FNP  SUMAtriptan (IMITREX) 50 MG tablet Take 1 tablet (50 mg total) by mouth once as needed for migraine. May repeat in 2 hours if headache persists or recurs. Patient not taking: Reported on 01/23/2021 05/07/20 05/08/21  Cuthriell, Charline Bills, PA-C    Inpatient Medications: Scheduled Meds: . [START ON 01/24/2021] aspirin EC  81 mg Oral Daily  . nicotine  21 mg Transdermal Daily   Continuous Infusions: . sodium chloride 75 mL/hr at 01/23/21 0847   PRN Meds: acetaminophen, albuterol, dextromethorphan-guaiFENesin, hydrALAZINE, morphine injection, nitroGLYCERIN, ondansetron (ZOFRAN) IV  Allergies:   No Known Allergies  Social History:   Social History   Socioeconomic History  . Marital status: Single    Spouse name: Not on file  . Number of children: Not on file  . Years of education: Not on file  . Highest education level: Not on file  Occupational History  . Occupation: catering  Tobacco Use  . Smoking status: Current Every Day Smoker    Packs/day: 1.00    Years: 5.00    Pack years: 5.00    Types:  Cigarettes  . Smokeless tobacco: Never Used  Vaping Use  . Vaping Use: Never used  Substance and Sexual Activity  . Alcohol use: Yes    Alcohol/week: 7.0 standard drinks    Types: 2 Cans of beer, 5 Shots of liquor per week    Comment: weekends  . Drug use: No    Frequency: 1.0 times per week  . Sexual activity: Yes    Birth control/protection: Rhythm  Other Topics Concern  . Not on file  Social History Narrative  . Not on file   Social Determinants of Health   Financial Resource Strain: Not on file  Food Insecurity: Not on file  Transportation Needs: Not on file  Physical Activity: Not on file  Stress: Not on file  Social Connections: Not on file  Intimate Partner Violence: Not on file    Family History:    Family History  Problem Relation Age of Onset  . Diabetes Mother   . Cancer Father      ROS:  Please see the history of present illness.  Review of Systems  Constitutional: Positive for chills, diaphoresis and malaise/fatigue. Negative for fever.  Respiratory: Positive for shortness of breath. Negative for hemoptysis.   Cardiovascular: Positive for chest pain and palpitations. Negative for orthopnea, leg swelling and PND.  Gastrointestinal: Positive for abdominal pain, blood in stool, diarrhea, heartburn and nausea. Negative for constipation and melena.       Nausea / gas. Uncertain if melena.  Genitourinary: Positive for flank pain and hematuria.  Musculoskeletal: Negative for falls.  Neurological: Positive for dizziness and weakness. Negative for loss of consciousness.  All other systems reviewed and are negative.   All other ROS reviewed and negative.     Physical Exam/Data:   Vitals:   01/23/21 0630 01/23/21 0700 01/23/21 0730 01/23/21 0800  BP: 137/81 (!) 150/95 (!) 145/107 (!) 160/97  Pulse: 81 79 77 80  Resp: 18 (!) 27 (!) 24 17  Temp:      TempSrc:      SpO2: 97% 100% 97% 97%  Weight:      Height:       No intake or output data in the 24  hours ending 01/23/21 0927 Last 3 Weights 01/23/2021 12/02/2020 08/31/2020  Weight (lbs) 240 lb 245 lb 253 lb  Weight (kg) 108.863 kg 111.131 kg 114.76 kg     Body mass index is 35.44 kg/m.  General:  Well nourished, well developed, in no acute distress HEENT: normal Lymph: no adenopathy Neck: no JVD Endocrine:  No thryomegaly Vascular: No carotid bruits; FA pulses 2+ bilaterally without bruits  Cardiac:  normal S1, S2; RRR; no murmur.  Chest pain tender to palpation.  Chest pain also elicited with laughing and coughing while laughin. Lungs:  clear to auscultation bilaterally, no wheezing, rhonchi or rales  Abd: soft, tender to palpation, no hepatomegaly  Ext: no edema Musculoskeletal:  No deformities, BUE and BLE strength normal and equal Skin: warm and dry  Neuro:  CNs 2-12 intact, no focal abnormalities noted Psych:  Normal affect   EKG:  The EKG was personally reviewed and demonstrates:  NSR, 95bpm, T wave inversion in 3, aVF, poor R wave progression V1, T wave inversion V5, V6, no acute changes from previous  Telemetry:  Telemetry was personally reviewed and demonstrates:  NSR, 70-80s  Relevant CV Studies: Echo 05/2018 Normal LVF  Normal Wall Motion  EF=60%  Normal Right side. Normal study. No cardiac source of emboli was  indentified.   Laboratory Data:  High Sensitivity Troponin:   Recent Labs  Lab 01/23/21 0121 01/23/21 0346 01/23/21 0826  TROPONINIHS 19* 22* 18*     Chemistry Recent Labs  Lab 01/23/21 0121  NA 135  K 3.9  CL 107  CO2 19*  GLUCOSE 132*  BUN 7  CREATININE 0.86  CALCIUM 8.4*  GFRNONAA >60  ANIONGAP 9    Recent Labs  Lab 01/23/21 0121  PROT 6.4*  ALBUMIN 3.6  AST 22  ALT 18  ALKPHOS 74  BILITOT 0.7   Hematology Recent Labs  Lab 01/23/21 0010  WBC 9.2  RBC 5.11  HGB 14.8  HCT 46.8  MCV 91.6  MCH 29.0  MCHC 31.6  RDW 13.9  PLT 260  BNPNo results for input(s): BNP, PROBNP in the last 168 hours.  DDimer No  results for input(s): DDIMER in the last 168 hours.   Radiology/Studies:  DG Chest 2 View  Result Date: 01/23/2021 CLINICAL DATA:  Left-sided chest EXAM: CHEST - 2 VIEW COMPARISON:  August 11, 2020 FINDINGS: The heart size and mediastinal contours are within normal limits. Both lungs are clear. The visualized skeletal structures are unremarkable. IMPRESSION: No active cardiopulmonary disease. Electronically Signed   By: Prudencio Pair M.D.   On: 01/23/2021 00:37   CT Angio Chest/Abd/Pel for Dissection W and/or W/WO  Result Date: 01/23/2021 CLINICAL DATA:  45 year old male with chest pain for 3 days. EXAM: CT ANGIOGRAPHY CHEST, ABDOMEN AND PELVIS TECHNIQUE: Chest CTA 06/17/2018.  CT Abdomen and Pelvis 05/07/2020, 08/31/2020. Multidetector CT imaging through the chest, abdomen and pelvis was performed using the standard protocol during bolus administration of intravenous contrast. Multiplanar reconstructed images and MIPs were obtained and reviewed to evaluate the vascular anatomy. CONTRAST:  119mL OMNIPAQUE IOHEXOL 350 MG/ML SOLN COMPARISON:  CTA chest 06/17/2018. CT Abdomen and Pelvis 08/31/2020 and earlier. FINDINGS: CTA CHEST FINDINGS Cardiovascular: Stable cardiac size at the upper limits of normal. No pericardial effusion. No calcified atherosclerosis in the chest. Negative thoracic aorta, no dissection or aneurysm. Central pulmonary arteries appear to be patent. Mediastinum/Nodes: Negative.  No lymphadenopathy. Lungs/Pleura: Major airways are patent. Lung volumes appear stable and within normal limits. Both lungs appear negative. No pleural effusion. Musculoskeletal: Stable, negative. Review of the MIP images confirms the above findings. CTA ABDOMEN AND PELVIS FINDINGS VASCULAR Negative for abdominal aortic aneurysm or dissection. Major arterial structures in the abdomen and pelvis are patent and appear normal aside from minimal atherosclerosis. Review of the MIP images confirms the above findings.  NON-VASCULAR Hepatobiliary: Chronic severe back steatosis. Negative gallbladder. No bile duct enlargement. Pancreas: Negative. Spleen: Negative. Adrenals/Urinary Tract: Bilateral adrenal nodularity is stable since 2019 compatible with benign adrenal adenomas (series 5, image 100). Both kidneys appear stable and within normal limits, small left renal upper pole cyst. No perinephric stranding or hydronephrosis. Renal enhancement is within normal limits. Decompressed ureters. Unremarkable urinary bladder. Stomach/Bowel: No dilated or inflamed large or small bowel. Normal appendix (series 5, image 146). There is perhaps mildly increased fluid in the colon. Stomach and duodenum appear negative. No free air or free fluid. Lymphatic: No lymphadenopathy. Reproductive: Negative. Other: No pelvic free fluid. Musculoskeletal: Stable, negative. Review of the MIP images confirms the above findings. IMPRESSION: 1. Negative for aortic dissection or aneurysm. Minimal atherosclerosis. 2. No acute or inflammatory process identified in the chest, abdomen, and pelvis. 3. Chronic severe hepatic steatosis. 4. Chronic benign adrenal adenomas. Electronically Signed   By: Genevie Ann M.D.   On: 01/23/2021 07:30     Assessment and Plan:   Atypical Chest pain, suspect noncardiac etiology --Reports current chest pain.  EKG without acute changes from previous as above.  High-sensitivity troponin minimally elevated and flat trending. Suspect Tn elevation due to elevated BP.  Chest pain is atypical in description and presentation suspicious for GI versus MSK etiology.  Not consistent with ACS at this time.  Previous CP workup for CP in past unrevealing. Previous echo as above with nl EF. RF includes current tobacco use, hypertension, male; therefore, will obtain repeat echo to reassess EF and rule out any wall motion abnormalities or acute structural changes. No indication for IV heparin. Restart diet per IM. If echo without significant  findings, can likely discharge home from cardiac  perspective. Recommend consider noncardiac etiology of chest pain.  He reports bright red blood in the toilet and abdominal pain- consider GI workup. Also considered is MSK etiology given TTP and pain increases with laughing.   HTN, goal BP less than 130/80 --Recommend titrate medications for goal BP 130/80 or lower.   Reported BRBPR --Reports a large amount of bright red blood in the toilet and associated with diarrhea/bowel movements.  Consider as etiology of his chest pain and stomach discomfort.  Recommend further work-up per IM.  Current tobacco/marijuana use --Currently smokes 1 pack/day.  Also smokes marijuana on a monthly basis.  Complete cessation recommended.  Current alcohol use --Consider as contributing to his GI symptoms.  Recommend cessation.     TIMI Risk Score for Unstable Angina or Non-ST Elevation MI:   The patient's TIMI risk score is 3, which indicates a 13% risk of all cause mortality, new or recurrent myocardial infarction or need for urgent revascularization in the next 14 days.           For questions or updates, please contact [place] Please consult [email or web address] for contact info under    Signed, Arvil Chaco, PA-C  01/23/2021 9:27 AM

## 2021-01-23 NOTE — ED Notes (Signed)
Dr. Karma Greaser on the phone with lab at this time regarding delay in results.

## 2021-01-23 NOTE — Progress Notes (Signed)
PHARMACIST - PHYSICIAN COMMUNICATION  CONCERNING:  Enoxaparin (Lovenox) for DVT Prophylaxis   DESCRIPTION: Patient was prescribed enoxaprin 40mg  q24 hours for VTE prophylaxis.   Filed Weights   01/23/21 0013  Weight: 108.9 kg (240 lb)    Body mass index is 35.44 kg/m.  Estimated Creatinine Clearance: 136.1 mL/min (by C-G formula based on SCr of 0.86 mg/dL).   Based on [place] patient is candidate for enoxaparin 0.5mg /kg TBW SQ every 24 hours based on BMI being >30.  RECOMMENDATION: Pharmacy has adjusted enoxaparin dose per [facility] policy.  Patient is now receiving enoxaparin 55 mg every 24 hours    Darnelle Bos, PharmD Clinical Pharmacist  01/23/2021 4:00 PM

## 2021-01-23 NOTE — ED Notes (Signed)
Pt taken to floor with another RN. Report and hand off given to lidsay, RN

## 2021-01-23 NOTE — ED Triage Notes (Signed)
Pt states left sided chest pain for 3 days. Pt states is also having right sided elbow pain. Pt states pain is sharp and feels shob at times.

## 2021-01-24 ENCOUNTER — Observation Stay (HOSPITAL_COMMUNITY): Payer: Self-pay

## 2021-01-24 DIAGNOSIS — R079 Chest pain, unspecified: Secondary | ICD-10-CM

## 2021-01-24 LAB — CBC
HCT: 40.7 % (ref 39.0–52.0)
HCT: 42.3 % (ref 39.0–52.0)
HCT: 40.1 % (ref 39.0–52.0)
HCT: 39.2 % (ref 39.0–52.0)
Hemoglobin: 13.4 g/dL (ref 13.0–17.0)
Hemoglobin: 14.1 g/dL (ref 13.0–17.0)
Hemoglobin: 13 g/dL (ref 13.0–17.0)
Hemoglobin: 12.7 g/dL — ABNORMAL LOW (ref 13.0–17.0)
MCH: 29.6 pg (ref 26.0–34.0)
MCH: 29.5 pg (ref 26.0–34.0)
MCH: 29.2 pg (ref 26.0–34.0)
MCH: 29.1 pg (ref 26.0–34.0)
MCHC: 32.9 g/dL (ref 30.0–36.0)
MCHC: 33.3 g/dL (ref 30.0–36.0)
MCHC: 32.4 g/dL (ref 30.0–36.0)
MCHC: 32.4 g/dL (ref 30.0–36.0)
MCV: 89.8 fL (ref 80.0–100.0)
MCV: 88.5 fL (ref 80.0–100.0)
MCV: 90.1 fL (ref 80.0–100.0)
MCV: 89.9 fL (ref 80.0–100.0)
Platelets: 249 10*3/uL (ref 150–400)
Platelets: 255 10*3/uL (ref 150–400)
Platelets: 274 10*3/uL (ref 150–400)
Platelets: 250 10*3/uL (ref 150–400)
RBC: 4.53 MIL/uL (ref 4.22–5.81)
RBC: 4.78 MIL/uL (ref 4.22–5.81)
RBC: 4.45 MIL/uL (ref 4.22–5.81)
RBC: 4.36 MIL/uL (ref 4.22–5.81)
RDW: 14 % (ref 11.5–15.5)
RDW: 13.9 % (ref 11.5–15.5)
RDW: 13.7 % (ref 11.5–15.5)
RDW: 13.6 % (ref 11.5–15.5)
WBC: 5.9 10*3/uL (ref 4.0–10.5)
WBC: 6.3 10*3/uL (ref 4.0–10.5)
WBC: 7.5 10*3/uL (ref 4.0–10.5)
WBC: 8 10*3/uL (ref 4.0–10.5)
nRBC: 0 % (ref 0.0–0.2)
nRBC: 0 % (ref 0.0–0.2)
nRBC: 0 % (ref 0.0–0.2)
nRBC: 0 % (ref 0.0–0.2)

## 2021-01-24 LAB — LIPID PANEL
Cholesterol: 165 mg/dL (ref 0–200)
HDL: 35 mg/dL — ABNORMAL LOW (ref >40)
LDL Cholesterol: 93 mg/dL (ref 0–99)
Total CHOL/HDL Ratio: 4.7 RATIO
Triglycerides: 187 mg/dL — ABNORMAL HIGH (ref <150)
VLDL: 37 mg/dL (ref 0–40)

## 2021-01-24 LAB — BASIC METABOLIC PANEL
Anion gap: 5 (ref 5–15)
BUN: 9 mg/dL (ref 6–20)
CO2: 23 mmol/L (ref 22–32)
Calcium: 8.3 mg/dL — ABNORMAL LOW (ref 8.9–10.3)
Chloride: 111 mmol/L (ref 98–111)
Creatinine, Ser: 0.84 mg/dL (ref 0.61–1.24)
GFR, Estimated: >60 mL/min (ref >60)
Glucose, Bld: 155 mg/dL — ABNORMAL HIGH (ref 70–99)
Potassium: 3.9 mmol/L (ref 3.5–5.1)
Sodium: 139 mmol/L (ref 135–145)

## 2021-01-24 LAB — NM MYOCAR MULTI W/SPECT W/WALL MOTION / EF
Estimated workload: 1 METS
Exercise duration (min): 0 min
Exercise duration (sec): 0 s
LV dias vol: 172 mL (ref 62–150)
LV sys vol: 97 mL
MPHR: 178 {beats}/min
Peak HR: 108 {beats}/min
Percent HR: 60 %
Rest HR: 80 {beats}/min
TID: 1.11

## 2021-01-24 LAB — HEMOGLOBIN A1C
Hgb A1c MFr Bld: 7.1 % — ABNORMAL HIGH (ref 4.8–5.6)
Mean Plasma Glucose: 157.07 mg/dL

## 2021-01-24 MED ORDER — CARVEDILOL 3.125 MG PO TABS
3.1250 mg | ORAL_TABLET | Freq: Two times a day (BID) | ORAL | Status: DC
  Administered 2021-01-24 – 2021-01-26 (×4): 3.125 mg via ORAL
  Filled 2021-01-24 (×4): qty 1

## 2021-01-24 MED ORDER — TECHNETIUM TC 99M TETROFOSMIN IV KIT
9.7760 | PACK | Freq: Once | INTRAVENOUS | Status: AC | PRN
  Administered 2021-01-24: 9.776 via INTRAVENOUS

## 2021-01-24 MED ORDER — MELATONIN 5 MG PO TABS
5.0000 mg | ORAL_TABLET | Freq: Every day | ORAL | Status: DC
  Administered 2021-01-24 – 2021-01-25 (×2): 5 mg via ORAL
  Filled 2021-01-24 (×2): qty 1

## 2021-01-24 MED ORDER — TECHNETIUM TC 99M TETROFOSMIN IV KIT
29.5820 | PACK | Freq: Once | INTRAVENOUS | Status: AC | PRN
  Administered 2021-01-24: 29.582 via INTRAVENOUS

## 2021-01-24 MED ORDER — ASPIRIN 81 MG PO CHEW
81.0000 mg | CHEWABLE_TABLET | Freq: Every day | ORAL | Status: DC
  Administered 2021-01-24 – 2021-01-25 (×2): 81 mg via ORAL
  Filled 2021-01-24 (×2): qty 1

## 2021-01-24 MED ORDER — ISOSORBIDE MONONITRATE ER 30 MG PO TB24
30.0000 mg | ORAL_TABLET | Freq: Every day | ORAL | Status: DC
  Administered 2021-01-24: 30 mg via ORAL
  Filled 2021-01-24: qty 1

## 2021-01-24 MED ORDER — ATORVASTATIN CALCIUM 10 MG PO TABS
10.0000 mg | ORAL_TABLET | Freq: Every day | ORAL | Status: DC
  Administered 2021-01-24: 10 mg via ORAL
  Filled 2021-01-24: qty 1

## 2021-01-24 MED ORDER — ATORVASTATIN CALCIUM 80 MG PO TABS
80.0000 mg | ORAL_TABLET | Freq: Every day | ORAL | Status: DC
  Administered 2021-01-25: 80 mg via ORAL
  Filled 2021-01-24: qty 1

## 2021-01-24 MED ORDER — REGADENOSON 0.4 MG/5ML IV SOLN
0.4000 mg | Freq: Once | INTRAVENOUS | Status: AC
  Administered 2021-01-24: 0.4 mg via INTRAVENOUS

## 2021-01-24 MED ORDER — ASPIRIN 81 MG PO CHEW
81.0000 mg | CHEWABLE_TABLET | ORAL | Status: AC
  Administered 2021-01-25: 81 mg via ORAL
  Filled 2021-01-24: qty 1

## 2021-01-24 MED ORDER — SODIUM CHLORIDE 0.9% FLUSH
10.0000 mL | Freq: Two times a day (BID) | INTRAVENOUS | Status: DC
  Administered 2021-01-24 – 2021-01-26 (×4): 10 mL via INTRAVENOUS

## 2021-01-24 NOTE — H&P (View-Only) (Signed)
Progress Note  Patient Name: Logan Lucas Date of Encounter: 01/24/2021  Primary Cardiologist: [facility], Dr. Fletcher Anon  Subjective   Reports current 8/10 chest pain.  Chest pain is very tender to palpation.  He reports that his chest pain is also worse when coughing and laughing.  He reports similar pain in his neck and shoulder.  While chest pain is reported as 8/10, he frequently dozes off while in the nuclear medicine holding area.  Reports current shortness of breath and dry cough.    Reports ongoing GI issues as outlined below.  Abdominal exam very tender to palpation.  Inpatient Medications    Scheduled Meds: . enoxaparin (LOVENOX) injection  0.5 mg/kg Subcutaneous Q24H  . folic acid  1 mg Oral Daily  . LORazepam  0-4 mg Intravenous Q6H   Followed by  . [START ON 01/25/2021] LORazepam  0-4 mg Intravenous Q12H  . metoprolol tartrate  12.5 mg Oral BID  . multivitamin with minerals  1 tablet Oral Daily  . nicotine  21 mg Transdermal Daily  . pantoprazole  40 mg Oral BID  . sucralfate  1 g Oral QID  . thiamine  100 mg Oral Daily   Or  . thiamine  100 mg Intravenous Daily   Continuous Infusions: . sodium chloride 75 mL/hr at 01/24/21 0305   PRN Meds: acetaminophen, albuterol, dextromethorphan-guaiFENesin, hydrALAZINE, LORazepam **OR** LORazepam, morphine injection, nitroGLYCERIN, ondansetron (ZOFRAN) IV   Vital Signs    Vitals:   01/24/21 0309 01/24/21 0317 01/24/21 0816 01/24/21 0900  BP:  108/72 116/78 134/85  Pulse: 78 74 77 82  Resp:  16 16   Temp:  98.5 F (36.9 C) 98.6 F (37 C)   TempSrc:  Oral Oral   SpO2: 98% 97% 98%   Weight:  108.3 kg    Height:        Intake/Output Summary (Last 24 hours) at 01/24/2021 0937 Last data filed at 01/24/2021 0305 Gross per 24 hour  Intake 1364.98 ml  Output --  Net 1364.98 ml   Last 3 Weights 01/24/2021 01/23/2021 12/02/2020  Weight (lbs) 238 lb 11.2 oz 240 lb 245 lb  Weight (kg) 108.274 kg 108.863 kg 111.131 kg       Telemetry    NSR, 80s by telemetry when evaluated while I am nuclear medicine holding area- Personally Reviewed  ECG    No new tracings- Personally Reviewed  Physical Exam   GEN: No acute distress.  Somnolent. Neck: No JVD Cardiac: RRR, no murmurs, rubs, or gallops.  Chest pain is very tender to palpation. Respiratory: Clear to auscultation bilaterally. GI: Soft, very tender tender, non-distended  MS: No edema; No deformity. Neuro:  Nonfocal  Psych: Normal affect   Labs    High Sensitivity Troponin:   Recent Labs  Lab 01/23/21 0121 01/23/21 0346 01/23/21 0826 01/23/21 1147 01/23/21 1508  TROPONINIHS 19* 22* 18* 15 14      Chemistry Recent Labs  Lab 01/23/21 0121 01/24/21 0600  NA 135 139  K 3.9 3.9  CL 107 111  CO2 19* 23  GLUCOSE 132* 155*  BUN 7 9  CREATININE 0.86 0.84  CALCIUM 8.4* 8.3*  PROT 6.4*  --   ALBUMIN 3.6  --   AST 22  --   ALT 18  --   ALKPHOS 74  --   BILITOT 0.7  --   GFRNONAA >60 >60  ANIONGAP 9 5     Hematology Recent Labs  Lab 01/23/21 1751  01/23/21 2258 01/24/21 0600  WBC 7.5 7.1 5.9  RBC 4.85 4.61 4.53  HGB 14.0 13.4 13.4  HCT 43.1 41.6 40.7  MCV 88.9 90.2 89.8  MCH 28.9 29.1 29.6  MCHC 32.5 32.2 32.9  RDW 13.9 13.9 14.0  PLT 259 261 249    BNPNo results for input(s): BNP, PROBNP in the last 168 hours.   DDimer No results for input(s): DDIMER in the last 168 hours.   Radiology    DG Chest 2 View  Result Date: 01/23/2021 CLINICAL DATA:  Left-sided chest EXAM: CHEST - 2 VIEW COMPARISON:  August 11, 2020 FINDINGS: The heart size and mediastinal contours are within normal limits. Both lungs are clear. The visualized skeletal structures are unremarkable. IMPRESSION: No active cardiopulmonary disease. Electronically Signed   By: Prudencio Pair M.D.   On: 01/23/2021 00:37   ECHOCARDIOGRAM COMPLETE  Result Date: 01/23/2021    ECHOCARDIOGRAM REPORT   Patient Name:   Logan Lucas Date of Exam: 01/23/2021 Medical  Rec #:  [id number]          Height:       69.0 in Accession #:    [id number]         Weight:       240.0 lb Date of Birth:  1976-08-17         BSA:          2.232 m Patient Age:    45 years           BP:           119/91 mmHg Patient Gender: M                  HR:           72 bpm. Exam Location:  [facility] Procedure: 2D Echo, Color Doppler and Cardiac Doppler Indications:     R07.9 Chest Pain  History:         Patient has no prior history of Echocardiogram examinations.                  Risk Factors:Hypertension and Current Smoker.  Sonographer:     Charmayne Sheer RDCS (AE) Referring Phys:  [id number] Arvil Chaco Diagnosing Phys: Kathlyn Sacramento MD  Sonographer Comments: Global longitudinal strain was attempted. IMPRESSIONS  1. Left ventricular ejection fraction, by estimation, is 55 to 60%. The left ventricle has normal function. The left ventricle has no regional wall motion abnormalities. There is mild left ventricular hypertrophy. Left ventricular diastolic parameters were normal. The average left ventricular global longitudinal strain is -14.8 %. The global longitudinal strain is abnormal.  2. Right ventricular systolic function is normal. The right ventricular size is normal. Tricuspid regurgitation signal is inadequate for assessing PA pressure.  3. The mitral valve is normal in structure. Mild mitral valve regurgitation. No evidence of mitral stenosis.  4. The aortic valve is normal in structure. Aortic valve regurgitation is not visualized. No aortic stenosis is present.  5. The inferior vena cava is normal in size with greater than 50% respiratory variability, suggesting right atrial pressure of 3 mmHg. FINDINGS  Left Ventricle: Left ventricular ejection fraction, by estimation, is 55 to 60%. The left ventricle has normal function. The left ventricle has no regional wall motion abnormalities. The average left ventricular global longitudinal strain is -14.8 %. The global longitudinal strain is abnormal. The left  ventricular internal cavity size was normal in size. There is mild left ventricular hypertrophy. Left ventricular  diastolic parameters were normal. Right Ventricle: The right ventricular size is normal. No increase in right ventricular wall thickness. Right ventricular systolic function is normal. Tricuspid regurgitation signal is inadequate for assessing PA pressure. Left Atrium: Left atrial size was normal in size. Right Atrium: Right atrial size was normal in size. Pericardium: There is no evidence of pericardial effusion. Mitral Valve: The mitral valve is normal in structure. Mild mitral valve regurgitation. No evidence of mitral valve stenosis. MV peak gradient, 3.1 mmHg. The mean mitral valve gradient is 2.0 mmHg. Tricuspid Valve: The tricuspid valve is normal in structure. Tricuspid valve regurgitation is not demonstrated. No evidence of tricuspid stenosis. Aortic Valve: The aortic valve is normal in structure. Aortic valve regurgitation is not visualized. No aortic stenosis is present. Aortic valve mean gradient measures 3.0 mmHg. Aortic valve peak gradient measures 5.3 mmHg. Aortic valve area, by VTI measures 2.92 cm. Pulmonic Valve: The pulmonic valve was normal in structure. Pulmonic valve regurgitation is not visualized. No evidence of pulmonic stenosis. Aorta: The aortic root is normal in size and structure. Venous: The inferior vena cava is normal in size with greater than 50% respiratory variability, suggesting right atrial pressure of 3 mmHg. IAS/Shunts: No atrial level shunt detected by color flow Doppler.  LEFT VENTRICLE PLAX 2D LVIDd:         5.20 cm  Diastology LVIDs:         3.80 cm  LV e' medial:    7.07 cm/s LV PW:         1.10 cm  LV E/e' medial:  10.8 LV IVS:        1.10 cm  LV e' lateral:   9.90 cm/s LVOT diam:     2.10 cm  LV E/e' lateral: 7.7 LV SV:         64 LV SV Index:   29       2D Longitudinal Strain LVOT Area:     3.46 cm 2D Strain GLS Avg:     -14.8 %  RIGHT VENTRICLE RV Basal  diam:  3.70 cm TAPSE (M-mode): 2.8 cm LEFT ATRIUM             Index       RIGHT ATRIUM           Index LA diam:        3.80 cm 1.70 cm/m  RA Area:     16.10 cm LA Vol (A2C):   59.6 ml 26.70 ml/m RA Volume:   38.60 ml  17.29 ml/m LA Vol (A4C):   47.7 ml 21.37 ml/m LA Biplane Vol: 55.8 ml 24.99 ml/m  AORTIC VALVE                   PULMONIC VALVE AV Area (Vmax):    2.97 cm    PV Vmax:       0.98 m/s AV Area (Vmean):   2.96 cm    PV Vmean:      71.400 cm/s AV Area (VTI):     2.92 cm    PV VTI:        0.211 m AV Vmax:           115.00 cm/s PV Peak grad:  3.8 mmHg AV Vmean:          80.800 cm/s PV Mean grad:  2.0 mmHg AV VTI:            0.218 m AV Peak Grad:  5.3 mmHg AV Mean Grad:      3.0 mmHg LVOT Vmax:         98.60 cm/s LVOT Vmean:        69.000 cm/s LVOT VTI:          0.184 m LVOT/AV VTI ratio: 0.84  AORTA Ao Root diam: 3.60 cm MITRAL VALVE MV Area (PHT): 3.81 cm    SHUNTS MV Area VTI:   2.91 cm    Systemic VTI:  0.18 m MV Peak grad:  3.1 mmHg    Systemic Diam: 2.10 cm MV Mean grad:  2.0 mmHg MV Vmax:       0.88 m/s MV Vmean:      61.5 cm/s MV Decel Time: 199 msec MV E velocity: 76.30 cm/s MV A velocity: 59.60 cm/s MV E/A ratio:  1.28 Kathlyn Sacramento MD Electronically signed by Kathlyn Sacramento MD Signature Date/Time: 01/23/2021/5:19:29 PM    Final    CT Angio Chest/Abd/Pel for Dissection W and/or W/WO  Result Date: 01/23/2021 CLINICAL DATA:  45 year old male with chest pain for 3 days. EXAM: CT ANGIOGRAPHY CHEST, ABDOMEN AND PELVIS TECHNIQUE: Chest CTA 06/17/2018.  CT Abdomen and Pelvis 05/07/2020, 08/31/2020. Multidetector CT imaging through the chest, abdomen and pelvis was performed using the standard protocol during bolus administration of intravenous contrast. Multiplanar reconstructed images and MIPs were obtained and reviewed to evaluate the vascular anatomy. CONTRAST:  186mL OMNIPAQUE IOHEXOL 350 MG/ML SOLN COMPARISON:  CTA chest 06/17/2018. CT Abdomen and Pelvis 08/31/2020 and earlier.  FINDINGS: CTA CHEST FINDINGS Cardiovascular: Stable cardiac size at the upper limits of normal. No pericardial effusion. No calcified atherosclerosis in the chest. Negative thoracic aorta, no dissection or aneurysm. Central pulmonary arteries appear to be patent. Mediastinum/Nodes: Negative.  No lymphadenopathy. Lungs/Pleura: Major airways are patent. Lung volumes appear stable and within normal limits. Both lungs appear negative. No pleural effusion. Musculoskeletal: Stable, negative. Review of the MIP images confirms the above findings. CTA ABDOMEN AND PELVIS FINDINGS VASCULAR Negative for abdominal aortic aneurysm or dissection. Major arterial structures in the abdomen and pelvis are patent and appear normal aside from minimal atherosclerosis. Review of the MIP images confirms the above findings. NON-VASCULAR Hepatobiliary: Chronic severe back steatosis. Negative gallbladder. No bile duct enlargement. Pancreas: Negative. Spleen: Negative. Adrenals/Urinary Tract: Bilateral adrenal nodularity is stable since 2019 compatible with benign adrenal adenomas (series 5, image 100). Both kidneys appear stable and within normal limits, small left renal upper pole cyst. No perinephric stranding or hydronephrosis. Renal enhancement is within normal limits. Decompressed ureters. Unremarkable urinary bladder. Stomach/Bowel: No dilated or inflamed large or small bowel. Normal appendix (series 5, image 146). There is perhaps mildly increased fluid in the colon. Stomach and duodenum appear negative. No free air or free fluid. Lymphatic: No lymphadenopathy. Reproductive: Negative. Other: No pelvic free fluid. Musculoskeletal: Stable, negative. Review of the MIP images confirms the above findings. IMPRESSION: 1. Negative for aortic dissection or aneurysm. Minimal atherosclerosis. 2. No acute or inflammatory process identified in the chest, abdomen, and pelvis. 3. Chronic severe hepatic steatosis. 4. Chronic benign adrenal adenomas.  Electronically Signed   By: Genevie Ann M.D.   On: 01/23/2021 07:30    Cardiac Studies   Echo 01/24/21 1. Left ventricular ejection fraction, by estimation, is 55 to 60%. The  left ventricle has normal function. The left ventricle has no regional  wall motion abnormalities. There is mild left ventricular hypertrophy.  Left ventricular diastolic parameters  were normal. The average left ventricular global longitudinal strain  is  -14.8 %. The global longitudinal strain is abnormal.  2. Right ventricular systolic function is normal. The right ventricular  size is normal. Tricuspid regurgitation signal is inadequate for assessing  PA pressure.  3. The mitral valve is normal in structure. Mild mitral valve  regurgitation. No evidence of mitral stenosis.  4. The aortic valve is normal in structure. Aortic valve regurgitation is  not visualized. No aortic stenosis is present.  5. The inferior vena cava is normal in size with greater than 50%  respiratory variability, suggesting right atrial pressure of 3 mmHg.   Patient Profile     45 y.o. male  with a hx of current tobacco use and 1/2 pack/day, current marijuana use, current alcohol use, current cocaine use, asthma, hypertension, h/o GI bleed, who is being seen today for the evaluation of chest pain.  Assessment & Plan    Atypical Chest pain, suspect noncardiac etiology --Reports current 8/10 chest pain, which is TTP.    Somnolent on exam.  EKG without acute ST/T changes. Tn minimally elevated, flat trending. Suspect supply demand Tn elevation, given elevated BP at presentation and recent cocaine use with UDS (+).  CP atypical in description and presentation. Less consistent with ACS and more consistent with MSK and GI etiologies.  Both chest and stomach tender to palpation.  Previous CP workup for CP in past unrevealing. Previous echo with EF nl. Repeat echo EF nl. RF includes current tobacco use, hypertension, male. Treadmill stress test was  recommended yesterday.  This morning, treadmill stress test was flipped to [place], given the patient does not have shoes with him.  He received his first injection for imaging & subsequently reported severe chest pain. I personally went downstairs to evaluate him with exam consistent with MSK etiology of pain in his chest, shoulders, back. He also reports dizziness when attempting to lie flat but able to lay flat on my exam.   Low suspicion for CP due to coronary ischemia/ACS.    Consider noncardiac etiology of chest pain, including MSK etiology.  He also reports symptoms of ingestion; therefore, GI etiology should be considered with consideration of his alcohol intake.  Also suspect some element of anxiety. No indication for IV heparin.   Scheduled for MPI.  Pt reports pain but resting comfortably.  Reports dizziness with laying flat but laying flat for pictures.  Vitals stable and telemetry without significant findings.   Pt agreeable to proceed with MPI.  Continue medical management.   Discontinue metoprolol, given recent cocaine positive UDS.   Start carvedilol, given this is a combination beta-blocker and to avoid unopposed alpha.   Start Imdur for CP relief.   Continue ASA, statin.  HTN, goal BP less than 130/80 --Recommend titrate medications for goal BP 130/80 or lower.   Reported BRBPR Reported GI issues / GERD --Reports a large amount of bright red blood in the toilet and associated with diarrhea/bowel movements.  Does note a history of GI issues/GERD.   Most recent H&H stable.  Per IM.  Current tobacco/marijuana use/cocaine use/alcohol use --Currently smokes 1 pack/day.  Also smokes marijuana on a monthly basis.  Urine drug screen positive for cocaine.   Discussed that alcohol will worsen his GI issues.  Discussed that tobacco use and marijuana will worsen his breathing issues.  Complete cessation recommended.   For questions or updates, please contact [place] Please consult [email or web address] for contact info under        Signed, Arvil Chaco,  PA-C  01/24/2021, 9:37 AM

## 2021-01-24 NOTE — Progress Notes (Signed)
Progress Note  Patient Name: Logan Lucas Date of Encounter: 01/24/2021  Primary Cardiologist: [facility], Dr. Fletcher Anon  Subjective   Reports current 8/10 chest pain.  Chest pain is very tender to palpation.  He reports that his chest pain is also worse when coughing and laughing.  He reports similar pain in his neck and shoulder.  While chest pain is reported as 8/10, he frequently dozes off while in the nuclear medicine holding area.  Reports current shortness of breath and dry cough.    Reports ongoing GI issues as outlined below.  Abdominal exam very tender to palpation.  Inpatient Medications    Scheduled Meds: . enoxaparin (LOVENOX) injection  0.5 mg/kg Subcutaneous Q24H  . folic acid  1 mg Oral Daily  . LORazepam  0-4 mg Intravenous Q6H   Followed by  . [START ON 01/25/2021] LORazepam  0-4 mg Intravenous Q12H  . metoprolol tartrate  12.5 mg Oral BID  . multivitamin with minerals  1 tablet Oral Daily  . nicotine  21 mg Transdermal Daily  . pantoprazole  40 mg Oral BID  . sucralfate  1 g Oral QID  . thiamine  100 mg Oral Daily   Or  . thiamine  100 mg Intravenous Daily   Continuous Infusions: . sodium chloride 75 mL/hr at 01/24/21 0305   PRN Meds: acetaminophen, albuterol, dextromethorphan-guaiFENesin, hydrALAZINE, LORazepam **OR** LORazepam, morphine injection, nitroGLYCERIN, ondansetron (ZOFRAN) IV   Vital Signs    Vitals:   01/24/21 0309 01/24/21 0317 01/24/21 0816 01/24/21 0900  BP:  108/72 116/78 134/85  Pulse: 78 74 77 82  Resp:  16 16   Temp:  98.5 F (36.9 C) 98.6 F (37 C)   TempSrc:  Oral Oral   SpO2: 98% 97% 98%   Weight:  108.3 kg    Height:        Intake/Output Summary (Last 24 hours) at 01/24/2021 0937 Last data filed at 01/24/2021 0305 Gross per 24 hour  Intake 1364.98 ml  Output -  Net 1364.98 ml   Last 3 Weights 01/24/2021 01/23/2021 12/02/2020  Weight (lbs) 238 lb 11.2 oz 240 lb 245 lb  Weight (kg) 108.274 kg 108.863 kg 111.131 kg       Telemetry    NSR, 80s by telemetry when evaluated while I am nuclear medicine holding area- Personally Reviewed  ECG    No new tracings- Personally Reviewed  Physical Exam   GEN: No acute distress.  Somnolent. Neck: No JVD Cardiac: RRR, no murmurs, rubs, or gallops.  Chest pain is very tender to palpation. Respiratory: Clear to auscultation bilaterally. GI: Soft, very tender tender, non-distended  MS: No edema; No deformity. Neuro:  Nonfocal  Psych: Normal affect   Labs    High Sensitivity Troponin:   Recent Labs  Lab 01/23/21 0121 01/23/21 0346 01/23/21 0826 01/23/21 1147 01/23/21 1508  TROPONINIHS 19* 22* 18* 15 14      Chemistry Recent Labs  Lab 01/23/21 0121 01/24/21 0600  NA 135 139  K 3.9 3.9  CL 107 111  CO2 19* 23  GLUCOSE 132* 155*  BUN 7 9  CREATININE 0.86 0.84  CALCIUM 8.4* 8.3*  PROT 6.4*  --   ALBUMIN 3.6  --   AST 22  --   ALT 18  --   ALKPHOS 74  --   BILITOT 0.7  --   GFRNONAA >60 >60  ANIONGAP 9 5     Hematology Recent Labs  Lab 01/23/21 1751  01/23/21 2258 01/24/21 0600  WBC 7.5 7.1 5.9  RBC 4.85 4.61 4.53  HGB 14.0 13.4 13.4  HCT 43.1 41.6 40.7  MCV 88.9 90.2 89.8  MCH 28.9 29.1 29.6  MCHC 32.5 32.2 32.9  RDW 13.9 13.9 14.0  PLT 259 261 249    BNPNo results for input(s): BNP, PROBNP in the last 168 hours.   DDimer No results for input(s): DDIMER in the last 168 hours.   Radiology    DG Chest 2 View  Result Date: 01/23/2021 CLINICAL DATA:  Left-sided chest EXAM: CHEST - 2 VIEW COMPARISON:  August 11, 2020 FINDINGS: The heart size and mediastinal contours are within normal limits. Both lungs are clear. The visualized skeletal structures are unremarkable. IMPRESSION: No active cardiopulmonary disease. Electronically Signed   By: Prudencio Pair M.D.   On: 01/23/2021 00:37   ECHOCARDIOGRAM COMPLETE  Result Date: 01/23/2021    ECHOCARDIOGRAM REPORT   Patient Name:   Logan Lucas Date of Exam: 01/23/2021 Medical  Rec #:  [id number]          Height:       69.0 in Accession #:    [id number]         Weight:       240.0 lb Date of Birth:  1976-05-09         BSA:          2.232 m Patient Age:    45 years           BP:           119/91 mmHg Patient Gender: M                  HR:           72 bpm. Exam Location:  [facility] Procedure: 2D Echo, Color Doppler and Cardiac Doppler Indications:     R07.9 Chest Pain  History:         Patient has no prior history of Echocardiogram examinations.                  Risk Factors:Hypertension and Current Smoker.  Sonographer:     Charmayne Sheer RDCS (AE) Referring Phys:  [id number] Arvil Chaco Diagnosing Phys: Kathlyn Sacramento MD  Sonographer Comments: Global longitudinal strain was attempted. IMPRESSIONS  1. Left ventricular ejection fraction, by estimation, is 55 to 60%. The left ventricle has normal function. The left ventricle has no regional wall motion abnormalities. There is mild left ventricular hypertrophy. Left ventricular diastolic parameters were normal. The average left ventricular global longitudinal strain is -14.8 %. The global longitudinal strain is abnormal.  2. Right ventricular systolic function is normal. The right ventricular size is normal. Tricuspid regurgitation signal is inadequate for assessing PA pressure.  3. The mitral valve is normal in structure. Mild mitral valve regurgitation. No evidence of mitral stenosis.  4. The aortic valve is normal in structure. Aortic valve regurgitation is not visualized. No aortic stenosis is present.  5. The inferior vena cava is normal in size with greater than 50% respiratory variability, suggesting right atrial pressure of 3 mmHg. FINDINGS  Left Ventricle: Left ventricular ejection fraction, by estimation, is 55 to 60%. The left ventricle has normal function. The left ventricle has no regional wall motion abnormalities. The average left ventricular global longitudinal strain is -14.8 %. The global longitudinal strain is abnormal. The left  ventricular internal cavity size was normal in size. There is mild left ventricular hypertrophy. Left ventricular  diastolic parameters were normal. Right Ventricle: The right ventricular size is normal. No increase in right ventricular wall thickness. Right ventricular systolic function is normal. Tricuspid regurgitation signal is inadequate for assessing PA pressure. Left Atrium: Left atrial size was normal in size. Right Atrium: Right atrial size was normal in size. Pericardium: There is no evidence of pericardial effusion. Mitral Valve: The mitral valve is normal in structure. Mild mitral valve regurgitation. No evidence of mitral valve stenosis. MV peak gradient, 3.1 mmHg. The mean mitral valve gradient is 2.0 mmHg. Tricuspid Valve: The tricuspid valve is normal in structure. Tricuspid valve regurgitation is not demonstrated. No evidence of tricuspid stenosis. Aortic Valve: The aortic valve is normal in structure. Aortic valve regurgitation is not visualized. No aortic stenosis is present. Aortic valve mean gradient measures 3.0 mmHg. Aortic valve peak gradient measures 5.3 mmHg. Aortic valve area, by VTI measures 2.92 cm. Pulmonic Valve: The pulmonic valve was normal in structure. Pulmonic valve regurgitation is not visualized. No evidence of pulmonic stenosis. Aorta: The aortic root is normal in size and structure. Venous: The inferior vena cava is normal in size with greater than 50% respiratory variability, suggesting right atrial pressure of 3 mmHg. IAS/Shunts: No atrial level shunt detected by color flow Doppler.  LEFT VENTRICLE PLAX 2D LVIDd:         5.20 cm  Diastology LVIDs:         3.80 cm  LV e' medial:    7.07 cm/s LV PW:         1.10 cm  LV E/e' medial:  10.8 LV IVS:        1.10 cm  LV e' lateral:   9.90 cm/s LVOT diam:     2.10 cm  LV E/e' lateral: 7.7 LV SV:         64 LV SV Index:   29       2D Longitudinal Strain LVOT Area:     3.46 cm 2D Strain GLS Avg:     -14.8 %  RIGHT VENTRICLE RV Basal  diam:  3.70 cm TAPSE (M-mode): 2.8 cm LEFT ATRIUM             Index       RIGHT ATRIUM           Index LA diam:        3.80 cm 1.70 cm/m  RA Area:     16.10 cm LA Vol (A2C):   59.6 ml 26.70 ml/m RA Volume:   38.60 ml  17.29 ml/m LA Vol (A4C):   47.7 ml 21.37 ml/m LA Biplane Vol: 55.8 ml 24.99 ml/m  AORTIC VALVE                   PULMONIC VALVE AV Area (Vmax):    2.97 cm    PV Vmax:       0.98 m/s AV Area (Vmean):   2.96 cm    PV Vmean:      71.400 cm/s AV Area (VTI):     2.92 cm    PV VTI:        0.211 m AV Vmax:           115.00 cm/s PV Peak grad:  3.8 mmHg AV Vmean:          80.800 cm/s PV Mean grad:  2.0 mmHg AV VTI:            0.218 m AV Peak Grad:  5.3 mmHg AV Mean Grad:      3.0 mmHg LVOT Vmax:         98.60 cm/s LVOT Vmean:        69.000 cm/s LVOT VTI:          0.184 m LVOT/AV VTI ratio: 0.84  AORTA Ao Root diam: 3.60 cm MITRAL VALVE MV Area (PHT): 3.81 cm    SHUNTS MV Area VTI:   2.91 cm    Systemic VTI:  0.18 m MV Peak grad:  3.1 mmHg    Systemic Diam: 2.10 cm MV Mean grad:  2.0 mmHg MV Vmax:       0.88 m/s MV Vmean:      61.5 cm/s MV Decel Time: 199 msec MV E velocity: 76.30 cm/s MV A velocity: 59.60 cm/s MV E/A ratio:  1.28 Kathlyn Sacramento MD Electronically signed by Kathlyn Sacramento MD Signature Date/Time: 01/23/2021/5:19:29 PM    Final    CT Angio Chest/Abd/Pel for Dissection W and/or W/WO  Result Date: 01/23/2021 CLINICAL DATA:  45 year old male with chest pain for 3 days. EXAM: CT ANGIOGRAPHY CHEST, ABDOMEN AND PELVIS TECHNIQUE: Chest CTA 06/17/2018.  CT Abdomen and Pelvis 05/07/2020, 08/31/2020. Multidetector CT imaging through the chest, abdomen and pelvis was performed using the standard protocol during bolus administration of intravenous contrast. Multiplanar reconstructed images and MIPs were obtained and reviewed to evaluate the vascular anatomy. CONTRAST:  162mL OMNIPAQUE IOHEXOL 350 MG/ML SOLN COMPARISON:  CTA chest 06/17/2018. CT Abdomen and Pelvis 08/31/2020 and earlier.  FINDINGS: CTA CHEST FINDINGS Cardiovascular: Stable cardiac size at the upper limits of normal. No pericardial effusion. No calcified atherosclerosis in the chest. Negative thoracic aorta, no dissection or aneurysm. Central pulmonary arteries appear to be patent. Mediastinum/Nodes: Negative.  No lymphadenopathy. Lungs/Pleura: Major airways are patent. Lung volumes appear stable and within normal limits. Both lungs appear negative. No pleural effusion. Musculoskeletal: Stable, negative. Review of the MIP images confirms the above findings. CTA ABDOMEN AND PELVIS FINDINGS VASCULAR Negative for abdominal aortic aneurysm or dissection. Major arterial structures in the abdomen and pelvis are patent and appear normal aside from minimal atherosclerosis. Review of the MIP images confirms the above findings. NON-VASCULAR Hepatobiliary: Chronic severe back steatosis. Negative gallbladder. No bile duct enlargement. Pancreas: Negative. Spleen: Negative. Adrenals/Urinary Tract: Bilateral adrenal nodularity is stable since 2019 compatible with benign adrenal adenomas (series 5, image 100). Both kidneys appear stable and within normal limits, small left renal upper pole cyst. No perinephric stranding or hydronephrosis. Renal enhancement is within normal limits. Decompressed ureters. Unremarkable urinary bladder. Stomach/Bowel: No dilated or inflamed large or small bowel. Normal appendix (series 5, image 146). There is perhaps mildly increased fluid in the colon. Stomach and duodenum appear negative. No free air or free fluid. Lymphatic: No lymphadenopathy. Reproductive: Negative. Other: No pelvic free fluid. Musculoskeletal: Stable, negative. Review of the MIP images confirms the above findings. IMPRESSION: 1. Negative for aortic dissection or aneurysm. Minimal atherosclerosis. 2. No acute or inflammatory process identified in the chest, abdomen, and pelvis. 3. Chronic severe hepatic steatosis. 4. Chronic benign adrenal adenomas.  Electronically Signed   By: Genevie Ann M.D.   On: 01/23/2021 07:30    Cardiac Studies   Echo 01/24/21 1. Left ventricular ejection fraction, by estimation, is 55 to 60%. The  left ventricle has normal function. The left ventricle has no regional  wall motion abnormalities. There is mild left ventricular hypertrophy.  Left ventricular diastolic parameters  were normal. The average left ventricular global longitudinal strain  is  -14.8 %. The global longitudinal strain is abnormal.  2. Right ventricular systolic function is normal. The right ventricular  size is normal. Tricuspid regurgitation signal is inadequate for assessing  PA pressure.  3. The mitral valve is normal in structure. Mild mitral valve  regurgitation. No evidence of mitral stenosis.  4. The aortic valve is normal in structure. Aortic valve regurgitation is  not visualized. No aortic stenosis is present.  5. The inferior vena cava is normal in size with greater than 50%  respiratory variability, suggesting right atrial pressure of 3 mmHg.   Patient Profile     45 y.o. male  with a hx of current tobacco use and 1/2 pack/day, current marijuana use, current alcohol use, current cocaine use, asthma, hypertension, h/o GI bleed, who is being seen today for the evaluation of chest pain.  Assessment & Plan    Atypical Chest pain, suspect noncardiac etiology --Reports current 8/10 chest pain, which is TTP.    Somnolent on exam.  EKG without acute ST/T changes. Tn minimally elevated, flat trending. Suspect supply demand Tn elevation, given elevated BP at presentation and recent cocaine use with UDS (+).  CP atypical in description and presentation. Less consistent with ACS and more consistent with MSK and GI etiologies.  Both chest and stomach tender to palpation.  Previous CP workup for CP in past unrevealing. Previous echo with EF nl. Repeat echo EF nl. RF includes current tobacco use, hypertension, male. Treadmill stress test was  recommended yesterday.  This morning, treadmill stress test was flipped to [place], given the patient does not have shoes with him.  He received his first injection for imaging & subsequently reported severe chest pain. I personally went downstairs to evaluate him with exam consistent with MSK etiology of pain in his chest, shoulders, back. He also reports dizziness when attempting to lie flat but able to lay flat on my exam.   Low suspicion for CP due to coronary ischemia/ACS.    Consider noncardiac etiology of chest pain, including MSK etiology.  He also reports symptoms of ingestion; therefore, GI etiology should be considered with consideration of his alcohol intake.  Also suspect some element of anxiety. No indication for IV heparin.   Scheduled for MPI.  Pt reports pain but resting comfortably.  Reports dizziness with laying flat but laying flat for pictures.  Vitals stable and telemetry without significant findings.   Pt agreeable to proceed with MPI.  Continue medical management.   Discontinue metoprolol, given recent cocaine positive UDS.   Start carvedilol, given this is a combination beta-blocker and to avoid unopposed alpha.   Start Imdur for CP relief.   Continue ASA, statin.  HTN, goal BP less than 130/80 --Recommend titrate medications for goal BP 130/80 or lower.   Reported BRBPR Reported GI issues / GERD --Reports a large amount of bright red blood in the toilet and associated with diarrhea/bowel movements.  Does note a history of GI issues/GERD.   Most recent H&H stable.  Per IM.  Current tobacco/marijuana use/cocaine use/alcohol use --Currently smokes 1 pack/day.  Also smokes marijuana on a monthly basis.  Urine drug screen positive for cocaine.   Discussed that alcohol will worsen his GI issues.  Discussed that tobacco use and marijuana will worsen his breathing issues.  Complete cessation recommended.   For questions or updates, please contact [place] Please consult [email or web address] for contact info under        Signed, Arvil Chaco,  PA-C  01/24/2021, 9:37 AM

## 2021-01-24 NOTE — Progress Notes (Signed)
PROGRESS NOTE    Logan Lucas  YHC:[id number] DOB: [birth year] DOA: 01/23/2021 PCP: Patient, No Pcp Per   Plan.  Chest pain Brief Narrative:  Logan Lucas is a 45 y.o. male with medical history significant of HTN, asthma, duodenitis, tobacco abuse, alcohol use, who presents with chest pain.  Nuclear stress test with possible high risk of large area of ischemia.  Pending decision for further work-up by cardiology.   Assessment & Plan:   Principal Problem:   Chest pain Active Problems:   Duodenitis   Asthma   Hypertension   Elevated troponin   Tobacco abuse   Alcohol use   Rectal bleeding  #1.  Chest pain. Possibility of unstable angina.  Abnormal stress test.  Pending decision about work-up.  We will keep patient overnight. Patient also had a CT angiogram of the chest, abdomen and pelvis.  Did not show any acute changes.  2.  Essential hypertension. Continue home medicines per  3.  Tobacco and alcohol abuse. Watching for withdrawal.  4.  Rectal bleeding. Seen by GI, recommend outpatient follow-up.    DVT prophylaxis: Lovenox Code Status: Full Family Communication:  Disposition Plan:  .   Status is: Observation  The patient remains OBS appropriate and will d/c before 2 midnights.  Dispo: The patient is from: Home              Anticipated d/c is to: Home              Patient currently is not medically stable to d/c.   Difficult to place patient No        I/O last 3 completed shifts: In: 1365 [I.V.:1365] Out: -  Total I/O In: 360 [P.O.:360] Out: 400 [Urine:400]     Consultants:   Cardiology  Procedures: None  Antimicrobials: None Subjective: Patient still has no chest pain localized in left upper chest, worse with deep breath.  No short of breath. No abdominal pain or nausea vomiting.  Objective: Vitals:   01/24/21 0309 01/24/21 0317 01/24/21 0816 01/24/21 0900  BP:  108/72 116/78 134/85  Pulse: 78 74 77 82  Resp:  16 16    Temp:  98.5 F (36.9 C) 98.6 F (37 C)   TempSrc:  Oral Oral   SpO2: 98% 97% 98%   Weight:  108.3 kg    Height:        Intake/Output Summary (Last 24 hours) at 01/24/2021 1539 Last data filed at 01/24/2021 1350 Gross per 24 hour  Intake 1724.98 ml  Output 400 ml  Net 1324.98 ml   Filed Weights   01/23/21 0013 01/24/21 0317  Weight: 108.9 kg 108.3 kg    Examination:  General exam: Appears calm and comfortable  Respiratory system: Clear to auscultation. Respiratory effort normal. Cardiovascular system: S1 & S2 heard, RRR. No JVD, murmurs, rubs, gallops or clicks. No pedal edema. Gastrointestinal system: Abdomen is nondistended, soft and nontender. No organomegaly or masses felt. Normal bowel sounds heard. Central nervous system: Alert and oriented. No focal neurological deficits. Extremities: Symmetric 5 x 5 power. Skin: No rashes, lesions or ulcers Psychiatry: Judgement and insight appear normal. Mood & affect appropriate.     Data Reviewed: I have personally reviewed following labs and imaging studies  CBC: Recent Labs  Lab 01/23/21 1147 01/23/21 1751 01/23/21 2258 01/24/21 0600 01/24/21 1148  WBC 7.5 7.5 7.1 5.9 6.3  HGB 13.8 14.0 13.4 13.4 14.1  HCT 41.4 43.1 41.6 40.7 42.3  MCV 88.7 88.9  90.2 89.8 88.5  PLT 250 259 261 249 846   Basic Metabolic Panel: Recent Labs  Lab 01/23/21 0121 01/24/21 0600  NA 135 139  K 3.9 3.9  CL 107 111  CO2 19* 23  GLUCOSE 132* 155*  BUN 7 9  CREATININE 0.86 0.84  CALCIUM 8.4* 8.3*   GFR: Estimated Creatinine Clearance: 138.9 mL/min (by C-G formula based on SCr of 0.84 mg/dL). Liver Function Tests: Recent Labs  Lab 01/23/21 0121  AST 22  ALT 18  ALKPHOS 74  BILITOT 0.7  PROT 6.4*  ALBUMIN 3.6   No results for input(s): LIPASE, AMYLASE in the last 168 hours. No results for input(s): AMMONIA in the last 168 hours. Coagulation Profile: No results for input(s): INR, PROTIME in the last 168 hours. Cardiac  Enzymes: No results for input(s): CKTOTAL, CKMB, CKMBINDEX, TROPONINI in the last 168 hours. BNP (last 3 results) No results for input(s): PROBNP in the last 8760 hours. HbA1C: Recent Labs    01/24/21 0600  HGBA1C 7.1*   CBG: No results for input(s): GLUCAP in the last 168 hours. Lipid Profile: Recent Labs    01/24/21 0600  CHOL 165  HDL 35*  LDLCALC 93  TRIG 187*  CHOLHDL 4.7   Thyroid Function Tests: No results for input(s): TSH, T4TOTAL, FREET4, T3FREE, THYROIDAB in the last 72 hours. Anemia Panel: No results for input(s): VITAMINB12, FOLATE, FERRITIN, TIBC, IRON, RETICCTPCT in the last 72 hours. Sepsis Labs: No results for input(s): PROCALCITON, LATICACIDVEN in the last 168 hours.  Recent Results (from the past 240 hour(s))  Resp Panel by RT-PCR (Flu A&B, Covid) Nasopharyngeal Swab     Status: None   Collection Time: 01/23/21  8:04 AM   Specimen: Nasopharyngeal Swab; Nasopharyngeal(NP) swabs in vial transport medium  Result Value Ref Range Status   SARS Coronavirus 2 by RT PCR NEGATIVE NEGATIVE Final    Comment: (NOTE) SARS-CoV-2 target nucleic acids are NOT DETECTED.  The SARS-CoV-2 RNA is generally detectable in upper respiratory specimens during the acute phase of infection. The lowest concentration of SARS-CoV-2 viral copies this assay can detect is 138 copies/mL. A negative result does not preclude SARS-Cov-2 infection and should not be used as the sole basis for treatment or other patient management decisions. A negative result may occur with  improper specimen collection/handling, submission of specimen other than nasopharyngeal swab, presence of viral mutation(s) within the areas targeted by this assay, and inadequate number of viral copies(<138 copies/mL). A negative result must be combined with clinical observations, patient history, and epidemiological information. The expected result is Negative.  Fact Sheet for Patients:   EntrepreneurPulse.com.au  Fact Sheet for Healthcare Providers:  IncredibleEmployment.be  This test is no t yet approved or cleared by the [place] FDA and  has been authorized for detection and/or diagnosis of SARS-CoV-2 by FDA under an Emergency Use Authorization (EUA). This EUA will remain  in effect (meaning this test can be used) for the duration of the COVID-19 declaration under Section 564(b)(1) of the Act, 21 U.S.C.section 360bbb-3(b)(1), unless the authorization is terminated  or revoked sooner.       Influenza A by PCR NEGATIVE NEGATIVE Final   Influenza B by PCR NEGATIVE NEGATIVE Final    Comment: (NOTE) The Xpert Xpress SARS-CoV-2/FLU/RSV plus assay is intended as an aid in the diagnosis of influenza from Nasopharyngeal swab specimens and should not be used as a sole basis for treatment. Nasal washings and aspirates are unacceptable for Xpert Xpress SARS-CoV-2/FLU/RSV testing.  Fact Sheet for Patients: EntrepreneurPulse.com.au  Fact Sheet for Healthcare Providers: IncredibleEmployment.be  This test is not yet approved or cleared by the [place] FDA and has been authorized for detection and/or diagnosis of SARS-CoV-2 by FDA under an Emergency Use Authorization (EUA). This EUA will remain in effect (meaning this test can be used) for the duration of the COVID-19 declaration under Section 564(b)(1) of the Act, 21 U.S.C. section 360bbb-3(b)(1), unless the authorization is terminated or revoked.  Performed at [facility], [address] [place], [place] [address]          Radiology Studies: DG Chest 2 View  Result Date: 01/23/2021 CLINICAL DATA:  Left-sided chest EXAM: CHEST - 2 VIEW COMPARISON:  August 11, 2020 FINDINGS: The heart size and mediastinal contours are within normal limits. Both lungs are clear. The visualized skeletal structures are unremarkable.  IMPRESSION: No active cardiopulmonary disease. Electronically Signed   By: Prudencio Pair M.D.   On: 01/23/2021 00:37   NM Myocar Multi W/Spect W/Wall Motion / EF  Result Date: 01/24/2021  Abnormal, potentially high risk pharmacologic myocardial perfusion stress test.  The is a large in size, mild in severity, reversible defect involving the mid and apical anterior/anteroseptal segments as well as the apex. This may reflect ischemia, though an element of artifact cannot be excluded. This defect is new compared with prior study from 06/19/2018.  The left ventricular ejection fraction is moderately to severely reduced (QGS 28%, Siemens 44%). LVEF appears low normal to mildly reduced by visual estimation.  There is no significant coronary artery calcification. Hepatic steatosis is incidentally noted on the attenuation correction CT.    ECHOCARDIOGRAM COMPLETE  Result Date: 01/23/2021    ECHOCARDIOGRAM REPORT   Patient Name:   Logan Lucas Date of Exam: 01/23/2021 Medical Rec #:  [id number]          Height:       69.0 in Accession #:    [id number]         Weight:       240.0 lb Date of Birth:  17-Apr-1976         BSA:          2.232 m Patient Age:    18 years           BP:           119/91 mmHg Patient Gender: M                  HR:           72 bpm. Exam Location:  [facility] Procedure: 2D Echo, Color Doppler and Cardiac Doppler Indications:     R07.9 Chest Pain  History:         Patient has no prior history of Echocardiogram examinations.                  Risk Factors:Hypertension and Current Smoker.  Sonographer:     Charmayne Sheer RDCS (AE) Referring Phys:  [id number] Arvil Chaco Diagnosing Phys: Kathlyn Sacramento MD  Sonographer Comments: Global longitudinal strain was attempted. IMPRESSIONS  1. Left ventricular ejection fraction, by estimation, is 55 to 60%. The left ventricle has normal function. The left ventricle has no regional wall motion abnormalities. There is mild left ventricular hypertrophy. Left  ventricular diastolic parameters were normal. The average left ventricular global longitudinal strain is -14.8 %. The global longitudinal strain is abnormal.  2. Right ventricular systolic function is normal. The right ventricular  size is normal. Tricuspid regurgitation signal is inadequate for assessing PA pressure.  3. The mitral valve is normal in structure. Mild mitral valve regurgitation. No evidence of mitral stenosis.  4. The aortic valve is normal in structure. Aortic valve regurgitation is not visualized. No aortic stenosis is present.  5. The inferior vena cava is normal in size with greater than 50% respiratory variability, suggesting right atrial pressure of 3 mmHg. FINDINGS  Left Ventricle: Left ventricular ejection fraction, by estimation, is 55 to 60%. The left ventricle has normal function. The left ventricle has no regional wall motion abnormalities. The average left ventricular global longitudinal strain is -14.8 %. The global longitudinal strain is abnormal. The left ventricular internal cavity size was normal in size. There is mild left ventricular hypertrophy. Left ventricular diastolic parameters were normal. Right Ventricle: The right ventricular size is normal. No increase in right ventricular wall thickness. Right ventricular systolic function is normal. Tricuspid regurgitation signal is inadequate for assessing PA pressure. Left Atrium: Left atrial size was normal in size. Right Atrium: Right atrial size was normal in size. Pericardium: There is no evidence of pericardial effusion. Mitral Valve: The mitral valve is normal in structure. Mild mitral valve regurgitation. No evidence of mitral valve stenosis. MV peak gradient, 3.1 mmHg. The mean mitral valve gradient is 2.0 mmHg. Tricuspid Valve: The tricuspid valve is normal in structure. Tricuspid valve regurgitation is not demonstrated. No evidence of tricuspid stenosis. Aortic Valve: The aortic valve is normal in structure. Aortic valve  regurgitation is not visualized. No aortic stenosis is present. Aortic valve mean gradient measures 3.0 mmHg. Aortic valve peak gradient measures 5.3 mmHg. Aortic valve area, by VTI measures 2.92 cm. Pulmonic Valve: The pulmonic valve was normal in structure. Pulmonic valve regurgitation is not visualized. No evidence of pulmonic stenosis. Aorta: The aortic root is normal in size and structure. Venous: The inferior vena cava is normal in size with greater than 50% respiratory variability, suggesting right atrial pressure of 3 mmHg. IAS/Shunts: No atrial level shunt detected by color flow Doppler.  LEFT VENTRICLE PLAX 2D LVIDd:         5.20 cm  Diastology LVIDs:         3.80 cm  LV e' medial:    7.07 cm/s LV PW:         1.10 cm  LV E/e' medial:  10.8 LV IVS:        1.10 cm  LV e' lateral:   9.90 cm/s LVOT diam:     2.10 cm  LV E/e' lateral: 7.7 LV SV:         64 LV SV Index:   29       2D Longitudinal Strain LVOT Area:     3.46 cm 2D Strain GLS Avg:     -14.8 %  RIGHT VENTRICLE RV Basal diam:  3.70 cm TAPSE (M-mode): 2.8 cm LEFT ATRIUM             Index       RIGHT ATRIUM           Index LA diam:        3.80 cm 1.70 cm/m  RA Area:     16.10 cm LA Vol (A2C):   59.6 ml 26.70 ml/m RA Volume:   38.60 ml  17.29 ml/m LA Vol (A4C):   47.7 ml 21.37 ml/m LA Biplane Vol: 55.8 ml 24.99 ml/m  AORTIC VALVE  PULMONIC VALVE AV Area (Vmax):    2.97 cm    PV Vmax:       0.98 m/s AV Area (Vmean):   2.96 cm    PV Vmean:      71.400 cm/s AV Area (VTI):     2.92 cm    PV VTI:        0.211 m AV Vmax:           115.00 cm/s PV Peak grad:  3.8 mmHg AV Vmean:          80.800 cm/s PV Mean grad:  2.0 mmHg AV VTI:            0.218 m AV Peak Grad:      5.3 mmHg AV Mean Grad:      3.0 mmHg LVOT Vmax:         98.60 cm/s LVOT Vmean:        69.000 cm/s LVOT VTI:          0.184 m LVOT/AV VTI ratio: 0.84  AORTA Ao Root diam: 3.60 cm MITRAL VALVE MV Area (PHT): 3.81 cm    SHUNTS MV Area VTI:   2.91 cm    Systemic VTI:  0.18  m MV Peak grad:  3.1 mmHg    Systemic Diam: 2.10 cm MV Mean grad:  2.0 mmHg MV Vmax:       0.88 m/s MV Vmean:      61.5 cm/s MV Decel Time: 199 msec MV E velocity: 76.30 cm/s MV A velocity: 59.60 cm/s MV E/A ratio:  1.28 Kathlyn Sacramento MD Electronically signed by Kathlyn Sacramento MD Signature Date/Time: 01/23/2021/5:19:29 PM    Final    CT Angio Chest/Abd/Pel for Dissection W and/or W/WO  Result Date: 01/23/2021 CLINICAL DATA:  45 year old male with chest pain for 3 days. EXAM: CT ANGIOGRAPHY CHEST, ABDOMEN AND PELVIS TECHNIQUE: Chest CTA 06/17/2018.  CT Abdomen and Pelvis 05/07/2020, 08/31/2020. Multidetector CT imaging through the chest, abdomen and pelvis was performed using the standard protocol during bolus administration of intravenous contrast. Multiplanar reconstructed images and MIPs were obtained and reviewed to evaluate the vascular anatomy. CONTRAST:  160mL OMNIPAQUE IOHEXOL 350 MG/ML SOLN COMPARISON:  CTA chest 06/17/2018. CT Abdomen and Pelvis 08/31/2020 and earlier. FINDINGS: CTA CHEST FINDINGS Cardiovascular: Stable cardiac size at the upper limits of normal. No pericardial effusion. No calcified atherosclerosis in the chest. Negative thoracic aorta, no dissection or aneurysm. Central pulmonary arteries appear to be patent. Mediastinum/Nodes: Negative.  No lymphadenopathy. Lungs/Pleura: Major airways are patent. Lung volumes appear stable and within normal limits. Both lungs appear negative. No pleural effusion. Musculoskeletal: Stable, negative. Review of the MIP images confirms the above findings. CTA ABDOMEN AND PELVIS FINDINGS VASCULAR Negative for abdominal aortic aneurysm or dissection. Major arterial structures in the abdomen and pelvis are patent and appear normal aside from minimal atherosclerosis. Review of the MIP images confirms the above findings. NON-VASCULAR Hepatobiliary: Chronic severe back steatosis. Negative gallbladder. No bile duct enlargement. Pancreas: Negative. Spleen:  Negative. Adrenals/Urinary Tract: Bilateral adrenal nodularity is stable since 2019 compatible with benign adrenal adenomas (series 5, image 100). Both kidneys appear stable and within normal limits, small left renal upper pole cyst. No perinephric stranding or hydronephrosis. Renal enhancement is within normal limits. Decompressed ureters. Unremarkable urinary bladder. Stomach/Bowel: No dilated or inflamed large or small bowel. Normal appendix (series 5, image 146). There is perhaps mildly increased fluid in the colon. Stomach and duodenum appear negative. No free air or free fluid.  Lymphatic: No lymphadenopathy. Reproductive: Negative. Other: No pelvic free fluid. Musculoskeletal: Stable, negative. Review of the MIP images confirms the above findings. IMPRESSION: 1. Negative for aortic dissection or aneurysm. Minimal atherosclerosis. 2. No acute or inflammatory process identified in the chest, abdomen, and pelvis. 3. Chronic severe hepatic steatosis. 4. Chronic benign adrenal adenomas. Electronically Signed   By: Genevie Ann M.D.   On: 01/23/2021 07:30        Scheduled Meds: . aspirin  81 mg Oral Daily  . [START ON 01/25/2021] atorvastatin  80 mg Oral Daily  . carvedilol  3.125 mg Oral BID WC  . enoxaparin (LOVENOX) injection  0.5 mg/kg Subcutaneous Q24H  . folic acid  1 mg Oral Daily  . LORazepam  0-4 mg Intravenous Q6H   Followed by  . [START ON 01/25/2021] LORazepam  0-4 mg Intravenous Q12H  . multivitamin with minerals  1 tablet Oral Daily  . nicotine  21 mg Transdermal Daily  . pantoprazole  40 mg Oral BID  . sucralfate  1 g Oral QID  . thiamine  100 mg Oral Daily   Or  . thiamine  100 mg Intravenous Daily   Continuous Infusions:   LOS: 0 days    Time spent: 28 minutes    Sharen Hones, MD [facility]   To contact the attending provider between 7A-7P or the covering provider during after hours 7P-7A, please log into the web site [email or web address] and access using universal [facility] password for that web site. If you do not have the password, please call the hospital operator.  01/24/2021, 3:39 PM

## 2021-01-24 NOTE — Progress Notes (Signed)
Nutrition Brief Note  Patient identified on the Malnutrition Screening Tool (MST) Report  45 y.o. male with medical history significant of HTN, asthma, duodenitis and substance abuse who is admitted with chest pain and rectal bleeding.   Spoke with pt via phone. Pt reports fairly good appetite and oral intake pta. Pt NPO for stress test this morning but reports that he has completed this and has placed a lunch order. Per chart, pt is down 21lbs(8%) over the past 8 months; this is not significant.   Wt Readings from Last 15 Encounters:  01/24/21 108.3 kg  12/02/20 111.1 kg  08/31/20 114.8 kg  08/11/20 115.7 kg  06/14/20 117 kg  05/07/20 117.9 kg  12/26/19 122.5 kg  10/14/19 117 kg  10/10/19 117.9 kg  08/21/19 122 kg  07/12/19 117.9 kg  07/06/19 124.7 kg  04/13/19 119.3 kg  01/25/19 127 kg  12/08/18 129.7 kg    Body mass index is 35.25 kg/m. Patient meets criteria for obesity based on current BMI.   Labs and medications reviewed.   No nutrition interventions warranted at this time. If nutrition issues arise, please consult RD.   Koleen Distance MS, RD, LDN Please refer to [facility] for RD and/or RD on-call/weekend/after hours pager

## 2021-01-25 ENCOUNTER — Encounter
Admission: EM | Disposition: A | Discharge status: Home / Self Care | Payer: Self-pay | Source: Home / Self Care | Attending: Obstetrics and Gynecology

## 2021-01-25 DIAGNOSIS — F1010 Alcohol abuse, uncomplicated: Secondary | ICD-10-CM

## 2021-01-25 DIAGNOSIS — E119 Type 2 diabetes mellitus without complications: Secondary | ICD-10-CM

## 2021-01-25 DIAGNOSIS — R9439 Abnormal result of other cardiovascular function study: Secondary | ICD-10-CM

## 2021-01-25 DIAGNOSIS — R072 Precordial pain: Secondary | ICD-10-CM

## 2021-01-25 DIAGNOSIS — F1410 Cocaine abuse, uncomplicated: Secondary | ICD-10-CM

## 2021-01-25 DIAGNOSIS — E1165 Type 2 diabetes mellitus with hyperglycemia: Secondary | ICD-10-CM

## 2021-01-25 HISTORY — PX: LEFT HEART CATH AND CORONARY ANGIOGRAPHY: CATH118249

## 2021-01-25 LAB — CBC
HCT: 39 % (ref 39.0–52.0)
Hemoglobin: 12.8 g/dL — ABNORMAL LOW (ref 13.0–17.0)
MCH: 29.6 pg (ref 26.0–34.0)
MCHC: 32.8 g/dL (ref 30.0–36.0)
MCV: 90.1 fL (ref 80.0–100.0)
Platelets: 264 10*3/uL (ref 150–400)
RBC: 4.33 MIL/uL (ref 4.22–5.81)
RDW: 13.7 % (ref 11.5–15.5)
WBC: 7 10*3/uL (ref 4.0–10.5)
nRBC: 0 % (ref 0.0–0.2)

## 2021-01-25 LAB — LIPASE, BLOOD: Lipase: 56 U/L — ABNORMAL HIGH (ref 11–51)

## 2021-01-25 SURGERY — LEFT HEART CATH AND CORONARY ANGIOGRAPHY
Anesthesia: Moderate Sedation

## 2021-01-25 MED ORDER — SODIUM CHLORIDE 0.9 % IV SOLN: INTRAVENOUS | Status: DC

## 2021-01-25 MED ORDER — HEPARIN SODIUM (PORCINE) 1000 UNIT/ML IJ SOLN
INTRAMUSCULAR | Status: DC | PRN
  Administered 2021-01-25: 5000 [IU] via INTRAVENOUS

## 2021-01-25 MED ORDER — LIDOCAINE HCL (PF) 1 % IJ SOLN
INTRAMUSCULAR | Status: AC
  Filled 2021-01-25: qty 30

## 2021-01-25 MED ORDER — LABETALOL HCL 5 MG/ML IV SOLN: 10.0000 mg | INTRAVENOUS | Status: AC | PRN

## 2021-01-25 MED ORDER — FUROSEMIDE 10 MG/ML IJ SOLN
40.0000 mg | Freq: Once | INTRAMUSCULAR | Status: AC
  Administered 2021-01-25: 40 mg via INTRAVENOUS

## 2021-01-25 MED ORDER — SODIUM CHLORIDE 0.9 % IV SOLN: 250.0000 mL | INTRAVENOUS | Status: DC | PRN

## 2021-01-25 MED ORDER — SODIUM CHLORIDE 0.9% FLUSH: 3.0000 mL | INTRAVENOUS | Status: DC | PRN

## 2021-01-25 MED ORDER — ENOXAPARIN SODIUM 60 MG/0.6ML ~~LOC~~ SOLN
0.5000 mg/kg | SUBCUTANEOUS | Status: DC
  Administered 2021-01-26: 55 mg via SUBCUTANEOUS
  Filled 2021-01-25 (×2): qty 0.6

## 2021-01-25 MED ORDER — MIDAZOLAM HCL 2 MG/2ML IJ SOLN
INTRAMUSCULAR | Status: DC | PRN
  Administered 2021-01-25: 1 mg via INTRAVENOUS

## 2021-01-25 MED ORDER — VERAPAMIL HCL 2.5 MG/ML IV SOLN
INTRAVENOUS | Status: AC
  Filled 2021-01-25: qty 2

## 2021-01-25 MED ORDER — ISOSORBIDE MONONITRATE ER 30 MG PO TB24
30.0000 mg | ORAL_TABLET | Freq: Every day | ORAL | Status: DC
  Administered 2021-01-25 – 2021-01-26 (×2): 30 mg via ORAL
  Filled 2021-01-25 (×5): qty 1

## 2021-01-25 MED ORDER — VERAPAMIL HCL 2.5 MG/ML IV SOLN
INTRAVENOUS | Status: DC | PRN
  Administered 2021-01-25: 2.5 mg via INTRA_ARTERIAL

## 2021-01-25 MED ORDER — SODIUM CHLORIDE 0.9 % IV SOLN
INTRAVENOUS | Status: AC | PRN
  Administered 2021-01-25: 10 mL/h via INTRAVENOUS

## 2021-01-25 MED ORDER — FENTANYL CITRATE (PF) 100 MCG/2ML IJ SOLN
INTRAMUSCULAR | Status: DC | PRN
  Administered 2021-01-25: 50 ug via INTRAVENOUS

## 2021-01-25 MED ORDER — MIDAZOLAM HCL 2 MG/2ML IJ SOLN
INTRAMUSCULAR | Status: AC
  Filled 2021-01-25: qty 2

## 2021-01-25 MED ORDER — HEPARIN SODIUM (PORCINE) 1000 UNIT/ML IJ SOLN
INTRAMUSCULAR | Status: AC
  Filled 2021-01-25: qty 1

## 2021-01-25 MED ORDER — HYDRALAZINE HCL 20 MG/ML IJ SOLN
10.0000 mg | INTRAMUSCULAR | Status: AC | PRN
  Administered 2021-01-25: 10 mg via INTRAVENOUS

## 2021-01-25 MED ORDER — SODIUM CHLORIDE 0.9% FLUSH
3.0000 mL | Freq: Two times a day (BID) | INTRAVENOUS | Status: DC
Start: 2021-01-25 — End: 2021-01-26
  Administered 2021-01-25: 3 mL via INTRAVENOUS

## 2021-01-25 MED ORDER — MORPHINE SULFATE (PF) 4 MG/ML IV SOLN
2.0000 mg | INTRAVENOUS | Status: DC | PRN
  Administered 2021-01-25: 2 mg via INTRAVENOUS
  Filled 2021-01-25: qty 1

## 2021-01-25 MED ORDER — SODIUM CHLORIDE 0.9% FLUSH
3.0000 mL | Freq: Two times a day (BID) | INTRAVENOUS | Status: DC
  Administered 2021-01-25: 3 mL via INTRAVENOUS

## 2021-01-25 MED ORDER — LIDOCAINE HCL (PF) 1 % IJ SOLN
INTRAMUSCULAR | Status: DC | PRN
  Administered 2021-01-25: 2 mL

## 2021-01-25 MED ORDER — HEPARIN (PORCINE) IN NACL 1000-0.9 UT/500ML-% IV SOLN
INTRAVENOUS | Status: DC | PRN
  Administered 2021-01-25: 500 mL

## 2021-01-25 MED ORDER — ENOXAPARIN SODIUM 40 MG/0.4ML ~~LOC~~ SOLN: 40.0000 mg | SUBCUTANEOUS | Status: DC

## 2021-01-25 MED ORDER — HEPARIN (PORCINE) IN NACL 1000-0.9 UT/500ML-% IV SOLN
INTRAVENOUS | Status: AC
  Filled 2021-01-25: qty 1000

## 2021-01-25 MED ORDER — FUROSEMIDE 10 MG/ML IJ SOLN
INTRAMUSCULAR | Status: AC
  Filled 2021-01-25: qty 4

## 2021-01-25 MED ORDER — HYDRALAZINE HCL 20 MG/ML IJ SOLN
INTRAMUSCULAR | Status: AC
  Filled 2021-01-25: qty 1

## 2021-01-25 MED ORDER — IOHEXOL 300 MG/ML  SOLN
INTRAMUSCULAR | Status: DC | PRN
  Administered 2021-01-25: 30 mL

## 2021-01-25 MED ORDER — FENTANYL CITRATE (PF) 100 MCG/2ML IJ SOLN
INTRAMUSCULAR | Status: AC
  Filled 2021-01-25: qty 2

## 2021-01-25 SURGICAL SUPPLY — 9 items
CATH 5F 110X4 TIG (CATHETERS) ×1 IMPLANT
DEVICE RAD TR BAND REGULAR (VASCULAR PRODUCTS) ×1 IMPLANT
GLIDESHEATH SLEND SS 6F .021 (SHEATH) ×1 IMPLANT
GUIDEWIRE INQWIRE 1.5J.035X260 (WIRE) IMPLANT
INQWIRE 1.5J .035X260CM (WIRE) ×2
PACK CARDIAC CATH (CUSTOM PROCEDURE TRAY) ×2 IMPLANT
PROTECTION STATION PRESSURIZED (MISCELLANEOUS) ×2
SET ATX SIMPLICITY (MISCELLANEOUS) ×1 IMPLANT
STATION PROTECTION PRESSURIZED (MISCELLANEOUS) IMPLANT

## 2021-01-25 NOTE — Progress Notes (Signed)
PHARMACIST - PHYSICIAN COMMUNICATION  CONCERNING:  Enoxaparin (Lovenox) for DVT Prophylaxis    RECOMMENDATION: Patient was prescribed enoxaprin 40mg  q24 hours for VTE prophylaxis.   Filed Weights   01/24/21 0317 01/25/21 0522 01/25/21 1307  Weight: 108.3 kg (238 lb 11.2 oz) 112.3 kg (247 lb 8 oz) 112.3 kg (247 lb 9.2 oz)    Body mass index is 36.56 kg/m.  Estimated Creatinine Clearance: 141.5 mL/min (by C-G formula based on SCr of 0.84 mg/dL).   Based on [place] patient is candidate for enoxaparin 0.5mg /kg TBW SQ every 24 hours based on BMI being >30.  DESCRIPTION: Pharmacy has adjusted enoxaparin dose per [facility] policy.  Patient is now receiving enoxaparin 55 mg every 24 hours    Berta Minor, PharmD Clinical Pharmacist  01/25/2021 3:16 PM

## 2021-01-25 NOTE — Interval H&P Note (Signed)
History and Physical Interval Note:  01/25/2021 1:14 PM  Logan Lucas  has presented today for surgery, with the diagnosis of Non-ST elevation myocardial infarction.  The various methods of treatment have been discussed with the patient and family. After consideration of risks, benefits and other options for treatment, the patient has consented to  Procedure(s): LEFT HEART CATH AND CORONARY ANGIOGRAPHY (N/A) as a surgical intervention.  The patient's history has been reviewed, patient examined, no change in status, stable for surgery.  I have reviewed the patient's chart and labs.  Questions were answered to the patient's satisfaction.    Cath Lab Visit (complete for each Cath Lab visit)  Clinical Evaluation Leading to the Procedure:   ACS: Yes.    Non-ACS:  N/A  Logan Lucas

## 2021-01-25 NOTE — Progress Notes (Signed)
Progress Note  Patient Name: Logan Lucas Date of Encounter: 01/25/2021  [facility] Cardiologist: [place] for cardiac cath today. Patient had some sharp chest pain overnight, reproducible on palpation. Breathing stable.   Inpatient Medications    Scheduled Meds: . aspirin  81 mg Oral Daily  . atorvastatin  80 mg Oral Daily  . carvedilol  3.125 mg Oral BID WC  . enoxaparin (LOVENOX) injection  0.5 mg/kg Subcutaneous Q24H  . folic acid  1 mg Oral Daily  . LORazepam  0-4 mg Intravenous Q12H  . melatonin  5 mg Oral QHS  . multivitamin with minerals  1 tablet Oral Daily  . nicotine  21 mg Transdermal Daily  . pantoprazole  40 mg Oral BID  . sodium chloride flush  10 mL Intravenous Q12H  . sucralfate  1 g Oral QID  . thiamine  100 mg Oral Daily   Or  . thiamine  100 mg Intravenous Daily   Continuous Infusions: . [START ON 01/26/2021] sodium chloride 75 mL/hr at 01/25/21 0514   PRN Meds: acetaminophen, albuterol, dextromethorphan-guaiFENesin, hydrALAZINE, LORazepam **OR** LORazepam, morphine injection, nitroGLYCERIN, ondansetron (ZOFRAN) IV   Vital Signs    Vitals:   01/25/21 0435 01/25/21 0522 01/25/21 0737 01/25/21 1132  BP: 127/73  120/70 125/87  Pulse: 79  81 85  Resp: 20  20 18   Temp: 98.4 F (36.9 C)  98.5 F (36.9 C) 98.5 F (36.9 C)  TempSrc: Oral  Oral Oral  SpO2: 98%  98% 100%  Weight:  112.3 kg    Height:        Intake/Output Summary (Last 24 hours) at 01/25/2021 1158 Last data filed at 01/25/2021 1133 Gross per 24 hour  Intake 971.21 ml  Output 900 ml  Net 71.21 ml   Last 3 Weights 01/25/2021 01/24/2021 01/23/2021  Weight (lbs) 247 lb 8 oz 238 lb 11.2 oz 240 lb  Weight (kg) 112.265 kg 108.274 kg 108.863 kg      Telemetry    NSR, HR 70-80s, 3 beats NSVT - Personally Reviewed  ECG    No new tracing - Personally Reviewed  Physical Exam   GEN: No acute distress.   Neck: No JVD Cardiac: RRR, no murmurs, rubs, or gallops.   Respiratory: Clear to auscultation bilaterally. GI: Soft, nontender, non-distended  MS: No edema; No deformity. Neuro:  Nonfocal  Psych: Normal affect   Labs    High Sensitivity Troponin:   Recent Labs  Lab 01/23/21 0121 01/23/21 0346 01/23/21 0826 01/23/21 1147 01/23/21 1508  TROPONINIHS 19* 22* 18* 15 14      Chemistry Recent Labs  Lab 01/23/21 0121 01/24/21 0600  NA 135 139  K 3.9 3.9  CL 107 111  CO2 19* 23  GLUCOSE 132* 155*  BUN 7 9  CREATININE 0.86 0.84  CALCIUM 8.4* 8.3*  PROT 6.4*  --   ALBUMIN 3.6  --   AST 22  --   ALT 18  --   ALKPHOS 74  --   BILITOT 0.7  --   GFRNONAA >60 >60  ANIONGAP 9 5     Hematology Recent Labs  Lab 01/24/21 1727 01/24/21 2317 01/25/21 0442  WBC 7.5 8.0 7.0  RBC 4.45 4.36 4.33  HGB 13.0 12.7* 12.8*  HCT 40.1 39.2 39.0  MCV 90.1 89.9 90.1  MCH 29.2 29.1 29.6  MCHC 32.4 32.4 32.8  RDW 13.7 13.6 13.7  PLT 274 250 264  BNPNo results for input(s): BNP, PROBNP in the last 168 hours.   DDimer No results for input(s): DDIMER in the last 168 hours.   Radiology    NM Myocar Multi W/Spect W/Wall Motion / EF  Result Date: 01/24/2021  Abnormal, potentially high risk pharmacologic myocardial perfusion stress test.  The is a large in size, mild in severity, reversible defect involving the mid and apical anterior/anteroseptal segments as well as the apex. This may reflect ischemia, though an element of artifact cannot be excluded. This defect is new compared with prior study from 06/19/2018.  The left ventricular ejection fraction is moderately to severely reduced (QGS 28%, Siemens 44%). LVEF appears low normal to mildly reduced by visual estimation.  There is no significant coronary artery calcification. Hepatic steatosis is incidentally noted on the attenuation correction CT.    ECHOCARDIOGRAM COMPLETE  Result Date: 01/23/2021    ECHOCARDIOGRAM REPORT   Patient Name:   [name] Date of Exam: 01/23/2021 Medical  Rec #:  [id number]          Height:       69.0 in Accession #:    [id number]         Weight:       240.0 lb Date of Birth:  [birth year]         BSA:          2.232 m Patient Age:    25 years           BP:           119/91 mmHg Patient Gender: M                  HR:           72 bpm. Exam Location:  [facility] Procedure: 2D Echo, Color Doppler and Cardiac Doppler Indications:     R07.9 Chest Pain  History:         Patient has no prior history of Echocardiogram examinations.                  Risk Factors:Hypertension and Current Smoker.  Sonographer:     Charmayne Sheer RDCS (AE) Referring Phys:  [id number] Arvil Chaco Diagnosing Phys: Kathlyn Sacramento MD  Sonographer Comments: Global longitudinal strain was attempted. IMPRESSIONS  1. Left ventricular ejection fraction, by estimation, is 55 to 60%. The left ventricle has normal function. The left ventricle has no regional wall motion abnormalities. There is mild left ventricular hypertrophy. Left ventricular diastolic parameters were normal. The average left ventricular global longitudinal strain is -14.8 %. The global longitudinal strain is abnormal.  2. Right ventricular systolic function is normal. The right ventricular size is normal. Tricuspid regurgitation signal is inadequate for assessing PA pressure.  3. The mitral valve is normal in structure. Mild mitral valve regurgitation. No evidence of mitral stenosis.  4. The aortic valve is normal in structure. Aortic valve regurgitation is not visualized. No aortic stenosis is present.  5. The inferior vena cava is normal in size with greater than 50% respiratory variability, suggesting right atrial pressure of 3 mmHg. FINDINGS  Left Ventricle: Left ventricular ejection fraction, by estimation, is 55 to 60%. The left ventricle has normal function. The left ventricle has no regional wall motion abnormalities. The average left ventricular global longitudinal strain is -14.8 %. The global longitudinal strain is abnormal. The left  ventricular internal cavity size was normal in size. There is mild left ventricular hypertrophy. Left ventricular diastolic parameters were  normal. Right Ventricle: The right ventricular size is normal. No increase in right ventricular wall thickness. Right ventricular systolic function is normal. Tricuspid regurgitation signal is inadequate for assessing PA pressure. Left Atrium: Left atrial size was normal in size. Right Atrium: Right atrial size was normal in size. Pericardium: There is no evidence of pericardial effusion. Mitral Valve: The mitral valve is normal in structure. Mild mitral valve regurgitation. No evidence of mitral valve stenosis. MV peak gradient, 3.1 mmHg. The mean mitral valve gradient is 2.0 mmHg. Tricuspid Valve: The tricuspid valve is normal in structure. Tricuspid valve regurgitation is not demonstrated. No evidence of tricuspid stenosis. Aortic Valve: The aortic valve is normal in structure. Aortic valve regurgitation is not visualized. No aortic stenosis is present. Aortic valve mean gradient measures 3.0 mmHg. Aortic valve peak gradient measures 5.3 mmHg. Aortic valve area, by VTI measures 2.92 cm. Pulmonic Valve: The pulmonic valve was normal in structure. Pulmonic valve regurgitation is not visualized. No evidence of pulmonic stenosis. Aorta: The aortic root is normal in size and structure. Venous: The inferior vena cava is normal in size with greater than 50% respiratory variability, suggesting right atrial pressure of 3 mmHg. IAS/Shunts: No atrial level shunt detected by color flow Doppler.  LEFT VENTRICLE PLAX 2D LVIDd:         5.20 cm  Diastology LVIDs:         3.80 cm  LV e' medial:    7.07 cm/s LV PW:         1.10 cm  LV E/e' medial:  10.8 LV IVS:        1.10 cm  LV e' lateral:   9.90 cm/s LVOT diam:     2.10 cm  LV E/e' lateral: 7.7 LV SV:         64 LV SV Index:   29       2D Longitudinal Strain LVOT Area:     3.46 cm 2D Strain GLS Avg:     -14.8 %  RIGHT VENTRICLE RV Basal  diam:  3.70 cm TAPSE (M-mode): 2.8 cm LEFT ATRIUM             Index       RIGHT ATRIUM           Index LA diam:        3.80 cm 1.70 cm/m  RA Area:     16.10 cm LA Vol (A2C):   59.6 ml 26.70 ml/m RA Volume:   38.60 ml  17.29 ml/m LA Vol (A4C):   47.7 ml 21.37 ml/m LA Biplane Vol: 55.8 ml 24.99 ml/m  AORTIC VALVE                   PULMONIC VALVE AV Area (Vmax):    2.97 cm    PV Vmax:       0.98 m/s AV Area (Vmean):   2.96 cm    PV Vmean:      71.400 cm/s AV Area (VTI):     2.92 cm    PV VTI:        0.211 m AV Vmax:           115.00 cm/s PV Peak grad:  3.8 mmHg AV Vmean:          80.800 cm/s PV Mean grad:  2.0 mmHg AV VTI:            0.218 m AV Peak Grad:      5.3 mmHg AV  Mean Grad:      3.0 mmHg LVOT Vmax:         98.60 cm/s LVOT Vmean:        69.000 cm/s LVOT VTI:          0.184 m LVOT/AV VTI ratio: 0.84  AORTA Ao Root diam: 3.60 cm MITRAL VALVE MV Area (PHT): 3.81 cm    SHUNTS MV Area VTI:   2.91 cm    Systemic VTI:  0.18 m MV Peak grad:  3.1 mmHg    Systemic Diam: 2.10 cm MV Mean grad:  2.0 mmHg MV Vmax:       0.88 m/s MV Vmean:      61.5 cm/s MV Decel Time: 199 msec MV E velocity: 76.30 cm/s MV A velocity: 59.60 cm/s MV E/A ratio:  1.28 Kathlyn Sacramento MD Electronically signed by Kathlyn Sacramento MD Signature Date/Time: 01/23/2021/5:19:29 PM    Final     Cardiac Studies    Echo 01/24/21 1. Left ventricular ejection fraction, by estimation, is 55 to 60%. The  left ventricle has normal function. The left ventricle has no regional  wall motion abnormalities. There is mild left ventricular hypertrophy.  Left ventricular diastolic parameters  were normal. The average left ventricular global longitudinal strain is  -14.8 %. The global longitudinal strain is abnormal.  2. Right ventricular systolic function is normal. The right ventricular  size is normal. Tricuspid regurgitation signal is inadequate for assessing  PA pressure.  3. The mitral valve is normal in structure. Mild mitral valve   regurgitation. No evidence of mitral stenosis.  4. The aortic valve is normal in structure. Aortic valve regurgitation is  not visualized. No aortic stenosis is present.  5. The inferior vena cava is normal in size with greater than 50%  respiratory variability, suggesting right atrial pressure of 3 mmHg.   Myoview/lexiscan  Abnormal, potentially high risk pharmacologic myocardial perfusion stress test.  The is a large in size, mild in severity, reversible defect involving the mid and apical anterior/anteroseptal segments as well as the apex. This may reflect ischemia, though an element of artifact cannot be excluded. This defect is new compared with prior study from 06/19/2018.  The left ventricular ejection fraction is moderately to severely reduced (QGS 28%, Siemens 44%). LVEF appears low normal to mildly reduced by visual estimation.  There is no significant coronary artery calcification. Hepatic steatosis is incidentally noted on the attenuation correction CT.     Patient Profile     45 y.o. male with hx of current tobacco use, marijuana use, alcohol use, cocaine use, HTN, h/o of GIB who is being seen for chest pain.   Assessment & Plan    Atypical chest pain - Troponin minimally elevated and EKG without acute changes. Also had elevated BP and recent cocaine use - Patient underwent Myoview that was abnormal and plan for cardiac cath today - Chest CTA 2/28 with no significant CAD, and negative for dissection or aneurysm - Pain is reproducible on exam today - coreg started - continue Aspirin and statin.  - LDL came back at 93 and A1C 7.1>>diabetes treatment per IM - further recs per cath  HTN - continue coreg - Bps good  Hx of GIB ?BRBPR - H&H stable - plan for OP follow-up  Polysubstance abuse - cessation encouraged  For questions or updates, please contact [place] Please consult [email or web address] for contact info under        Signed, Cadence Ninfa Meeker,  PA-C  01/25/2021, 11:58 AM

## 2021-01-25 NOTE — Progress Notes (Signed)
PROGRESS NOTE    Logan Lucas  AOZ:[id number] DOB: Aug 30, 1976 DOA: 01/23/2021 PCP: Patient, No Pcp Per   Plan.  Chest pain Brief Narrative:  Logan Lucas is a 45 y.o. male with medical history significant of HTN, asthma, duodenitis, tobacco abuse, alcohol use, who presents with chest pain.  Nuclear stress test with possible high risk of large area of ischemia.  Pending decision for further work-up by cardiology.   Assessment & Plan:   Principal Problem:   Chest pain Active Problems:   Duodenitis   Asthma   Hypertension   Elevated troponin   Tobacco abuse   Alcohol use   Rectal bleeding   Abnormal stress test   Alcohol abuse   Cocaine abuse ([place])   T2DM (type 2 diabetes mellitus) ([place])  #  Chest pain. With positive nuclear stress. LHC 3/2 neg. TTE w diastolic dysfunction. Cocaine vasospasm is possible. CTA neg. CXR neg. Hx pancreatitis, says this pain is different - cardiology has added imdur, will give lasix x1 - f/u lipase  #  Essential hypertension. Continue home medicines per  #  Alcohol abuse Drinks close to a bottle of liquor a day, denies hx complicated withdrawal, does endorse hx of withdrawal - monitor w/ CIWA  # T2DM New dx, a1c 7.1 - outpt pcp f/u, needs to establish w/ pcp  # Rectal bleeding # GERD H wnl, seen by GI, advise outpt f/u - f/u h pylori  # Cocaine abuse - discussed dangers - f/u hcv    DVT prophylaxis: Lovenox Code Status: Full Family Communication:  Disposition Plan:  .   Status is: inpt  The patient will require care spanning > 2 midnights and should be moved to inpatient because: Inpatient level of care appropriate due to severity of illness  Dispo: The patient is from: Home              Anticipated d/c is to: Home              Patient currently is not medically stable to d/c.   Difficult to place patient No        I/O last 3 completed shifts: In: 2336.2 [P.O.:840; I.V.:1496.2] Out: 400  [Urine:400] Total I/O In: 480 [P.O.:480] Out: 1100 [Urine:1100]     Consultants:   Cardiology  Procedures: None  Antimicrobials: None Subjective: Right wrist pain after cath. Left chest pain improving. No vomiting.   Objective: Vitals:   01/25/21 1445 01/25/21 1500 01/25/21 1510 01/25/21 1515  BP: (!) 143/99 (!) 143/105 (!) 143/105 (!) 151/102  Pulse: 86 78  86  Resp: (!) 21 18  (!) 29  Temp:      TempSrc:      SpO2: 97% 97%  98%  Weight:      Height:        Intake/Output Summary (Last 24 hours) at 01/25/2021 1533 Last data filed at 01/25/2021 1515 Gross per 24 hour  Intake 1091.21 ml  Output 1100 ml  Net -8.79 ml   Filed Weights   01/24/21 0317 01/25/21 0522 01/25/21 1307  Weight: 108.3 kg 112.3 kg 112.3 kg    Examination:  General exam: Appears calm and comfortable  Respiratory system: Clear to auscultation. Respiratory effort normal. Cardiovascular system: S1 & S2 heard, RRR. No JVD, murmurs, rubs, gallops or clicks. No pedal edema. Gastrointestinal system: Abdomen is nondistended, soft and nontender. No organomegaly or masses felt. Normal bowel sounds heard. Central nervous system: Alert and oriented. No focal neurological deficits. Extremities:  Symmetric 5 x 5 power. Skin: No rashes, lesions or ulcers Psychiatry: Judgement and insight appear normal. Mood & affect appropriate.     Data Reviewed: I have personally reviewed following labs and imaging studies  CBC: Recent Labs  Lab 01/24/21 0600 01/24/21 1148 01/24/21 1727 01/24/21 2317 01/25/21 0442  WBC 5.9 6.3 7.5 8.0 7.0  HGB 13.4 14.1 13.0 12.7* 12.8*  HCT 40.7 42.3 40.1 39.2 39.0  MCV 89.8 88.5 90.1 89.9 90.1  PLT 249 255 274 250 675   Basic Metabolic Panel: Recent Labs  Lab 01/23/21 0121 01/24/21 0600  NA 135 139  K 3.9 3.9  CL 107 111  CO2 19* 23  GLUCOSE 132* 155*  BUN 7 9  CREATININE 0.86 0.84  CALCIUM 8.4* 8.3*   GFR: Estimated Creatinine Clearance: 141.5 mL/min (by C-G  formula based on SCr of 0.84 mg/dL). Liver Function Tests: Recent Labs  Lab 01/23/21 0121  AST 22  ALT 18  ALKPHOS 74  BILITOT 0.7  PROT 6.4*  ALBUMIN 3.6   No results for input(s): LIPASE, AMYLASE in the last 168 hours. No results for input(s): AMMONIA in the last 168 hours. Coagulation Profile: No results for input(s): INR, PROTIME in the last 168 hours. Cardiac Enzymes: No results for input(s): CKTOTAL, CKMB, CKMBINDEX, TROPONINI in the last 168 hours. BNP (last 3 results) No results for input(s): PROBNP in the last 8760 hours. HbA1C: Recent Labs    01/24/21 0600  HGBA1C 7.1*   CBG: No results for input(s): GLUCAP in the last 168 hours. Lipid Profile: Recent Labs    01/24/21 0600  CHOL 165  HDL 35*  LDLCALC 93  TRIG 187*  CHOLHDL 4.7   Thyroid Function Tests: No results for input(s): TSH, T4TOTAL, FREET4, T3FREE, THYROIDAB in the last 72 hours. Anemia Panel: No results for input(s): VITAMINB12, FOLATE, FERRITIN, TIBC, IRON, RETICCTPCT in the last 72 hours. Sepsis Labs: No results for input(s): PROCALCITON, LATICACIDVEN in the last 168 hours.  Recent Results (from the past 240 hour(s))  Resp Panel by RT-PCR (Flu A&B, Covid) Nasopharyngeal Swab     Status: None   Collection Time: 01/23/21  8:04 AM   Specimen: Nasopharyngeal Swab; Nasopharyngeal(NP) swabs in vial transport medium  Result Value Ref Range Status   SARS Coronavirus 2 by RT PCR NEGATIVE NEGATIVE Final    Comment: (NOTE) SARS-CoV-2 target nucleic acids are NOT DETECTED.  The SARS-CoV-2 RNA is generally detectable in upper respiratory specimens during the acute phase of infection. The lowest concentration of SARS-CoV-2 viral copies this assay can detect is 138 copies/mL. A negative result does not preclude SARS-Cov-2 infection and should not be used as the sole basis for treatment or other patient management decisions. A negative result may occur with  improper specimen collection/handling,  submission of specimen other than nasopharyngeal swab, presence of viral mutation(s) within the areas targeted by this assay, and inadequate number of viral copies(<138 copies/mL). A negative result must be combined with clinical observations, patient history, and epidemiological information. The expected result is Negative.  Fact Sheet for Patients:  EntrepreneurPulse.com.au  Fact Sheet for Healthcare Providers:  IncredibleEmployment.be  This test is no t yet approved or cleared by the [place] FDA and  has been authorized for detection and/or diagnosis of SARS-CoV-2 by FDA under an Emergency Use Authorization (EUA). This EUA will remain  in effect (meaning this test can be used) for the duration of the COVID-19 declaration under Section 564(b)(1) of the Act, 21 U.S.C.section 360bbb-3(b)(1), unless  the authorization is terminated  or revoked sooner.       Influenza A by PCR NEGATIVE NEGATIVE Final   Influenza B by PCR NEGATIVE NEGATIVE Final    Comment: (NOTE) The Xpert Xpress SARS-CoV-2/FLU/RSV plus assay is intended as an aid in the diagnosis of influenza from Nasopharyngeal swab specimens and should not be used as a sole basis for treatment. Nasal washings and aspirates are unacceptable for Xpert Xpress SARS-CoV-2/FLU/RSV testing.  Fact Sheet for Patients: EntrepreneurPulse.com.au  Fact Sheet for Healthcare Providers: IncredibleEmployment.be  This test is not yet approved or cleared by the [place] FDA and has been authorized for detection and/or diagnosis of SARS-CoV-2 by FDA under an Emergency Use Authorization (EUA). This EUA will remain in effect (meaning this test can be used) for the duration of the COVID-19 declaration under Section 564(b)(1) of the Act, 21 U.S.C. section 360bbb-3(b)(1), unless the authorization is terminated or revoked.  Performed at [facility], [address], [place], [place] [address]          Radiology Studies: CARDIAC CATHETERIZATION  Result Date: 01/25/2021 Conclusions: 1. No angiographically significant coronary artery disease. 2. Moderately to severely elevated left ventricular filling pressure (LVEDP 30-35 mmHg). Recommendations: 1. Continue medical therapy for acute HFpEF and chest pain.  We will add isosorbide mononitrate 30 mg daily and administer furosemide 40 mg IV x1. 2. Continue primary prevention including smoking, marijuana, and cocaine cessation. 3. No indication for antiplatelet therapy in the setting of absence of coronary artery disease and concern for recent GI bleed. Nelva Bush, MD [facility]   NM Myocar Multi W/Spect Tamela Oddi Motion / EF  Result Date: 01/24/2021  Abnormal, potentially high risk pharmacologic myocardial perfusion stress test.  The is a large in size, mild in severity, reversible defect involving the mid and apical anterior/anteroseptal segments as well as the apex. This may reflect ischemia, though an element of artifact cannot be excluded. This defect is new compared with prior study from 06/19/2018.  The left ventricular ejection fraction is moderately to severely reduced (QGS 28%, Siemens 44%). LVEF appears low normal to mildly reduced by visual estimation.  There is no significant coronary artery calcification. Hepatic steatosis is incidentally noted on the attenuation correction CT.         Scheduled Meds: . [MAR Hold] carvedilol  3.125 mg Oral BID WC  . [START ON 01/26/2021] enoxaparin (LOVENOX) injection  0.5 mg/kg Subcutaneous Q24H  . [MAR Hold] folic acid  1 mg Oral Daily  . furosemide      . hydrALAZINE      . isosorbide mononitrate  30 mg Oral Daily  . [MAR Hold] LORazepam  0-4 mg Intravenous Q12H  . [MAR Hold] melatonin  5 mg Oral QHS  . [MAR Hold] multivitamin with minerals  1 tablet Oral Daily  . [MAR Hold] nicotine  21 mg Transdermal Daily  . [MAR Hold] pantoprazole   40 mg Oral BID  . [MAR Hold] sodium chloride flush  10 mL Intravenous Q12H  . sodium chloride flush  3 mL Intravenous Q12H  . sodium chloride flush  3 mL Intravenous Q12H  . [MAR Hold] sucralfate  1 g Oral QID  . [MAR Hold] thiamine  100 mg Oral Daily   Or  . [MAR Hold] thiamine  100 mg Intravenous Daily   Continuous Infusions: . sodium chloride       LOS: 1 day    Time spent: 40 minutes    Desma Maxim, MD  [facility]   To contact the attending provider between 7A-7P or the covering provider during after hours 7P-7A, please log into the web site [email or web address] and access using universal [place] password for that web site. If you do not have the password, please call the hospital operator.  01/25/2021, 3:33 PM

## 2021-01-25 NOTE — Plan of Care (Signed)
  Problem: Education: Goal: Knowledge of General Education information will improve Description: Including pain rating scale, medication(s)/side effects and non-pharmacologic comfort measures 01/25/2021 1119 by Cristela Blue, RN Outcome: Progressing 01/25/2021 1119 by Cristela Blue, RN Outcome: Progressing   Problem: Health Behavior/Discharge Planning: Goal: Ability to manage health-related needs will improve 01/25/2021 1119 by Cristela Blue, RN Outcome: Progressing 01/25/2021 1119 by Cristela Blue, RN Outcome: Progressing   Problem: Clinical Measurements: Goal: Ability to maintain clinical measurements within normal limits will improve 01/25/2021 1119 by Cristela Blue, RN Outcome: Progressing 01/25/2021 1119 by Cristela Blue, RN Outcome: Progressing Goal: Will remain free from infection 01/25/2021 1119 by Cristela Blue, RN Outcome: Progressing 01/25/2021 1119 by Cristela Blue, RN Outcome: Progressing Goal: Diagnostic test results will improve 01/25/2021 1119 by Cristela Blue, RN Outcome: Progressing 01/25/2021 1119 by Cristela Blue, RN Outcome: Progressing Goal: Respiratory complications will improve 01/25/2021 1119 by Cristela Blue, RN Outcome: Progressing 01/25/2021 1119 by Cristela Blue, RN Outcome: Progressing Goal: Cardiovascular complication will be avoided 01/25/2021 1119 by Cristela Blue, RN Outcome: Progressing 01/25/2021 1119 by Cristela Blue, RN Outcome: Progressing   Problem: Activity: Goal: Risk for activity intolerance will decrease 01/25/2021 1119 by Cristela Blue, RN Outcome: Progressing 01/25/2021 1119 by Cristela Blue, RN Outcome: Progressing   Problem: Nutrition: Goal: Adequate nutrition will be maintained 01/25/2021 1119 by Cristela Blue, RN Outcome: Progressing 01/25/2021 1119 by Cristela Blue, RN Outcome: Progressing   Problem: Coping: Goal: Level of anxiety will decrease 01/25/2021 1119 by Cristela Blue, RN Outcome: Progressing 01/25/2021 1119 by  Cristela Blue, RN Outcome: Progressing   Problem: Elimination: Goal: Will not experience complications related to bowel motility 01/25/2021 1119 by Cristela Blue, RN Outcome: Progressing 01/25/2021 1119 by Cristela Blue, RN Outcome: Progressing Goal: Will not experience complications related to urinary retention 01/25/2021 1119 by Cristela Blue, RN Outcome: Progressing 01/25/2021 1119 by Cristela Blue, RN Outcome: Progressing   Problem: Pain Managment: Goal: General experience of comfort will improve 01/25/2021 1119 by Cristela Blue, RN Outcome: Progressing 01/25/2021 1119 by Cristela Blue, RN Outcome: Progressing   Problem: Safety: Goal: Ability to remain free from injury will improve 01/25/2021 1119 by Cristela Blue, RN Outcome: Progressing 01/25/2021 1119 by Cristela Blue, RN Outcome: Progressing   Problem: Skin Integrity: Goal: Risk for impaired skin integrity will decrease 01/25/2021 1119 by Cristela Blue, RN Outcome: Progressing 01/25/2021 1119 by Cristela Blue, RN Outcome: Progressing

## 2021-01-26 ENCOUNTER — Encounter: Payer: Self-pay | Admitting: Internal Medicine

## 2021-01-26 LAB — BASIC METABOLIC PANEL
Anion gap: 7 (ref 5–15)
BUN: 11 mg/dL (ref 6–20)
CO2: 24 mmol/L (ref 22–32)
Calcium: 8.6 mg/dL — ABNORMAL LOW (ref 8.9–10.3)
Chloride: 107 mmol/L (ref 98–111)
Creatinine, Ser: 0.91 mg/dL (ref 0.61–1.24)
GFR, Estimated: >60 mL/min (ref >60)
Glucose, Bld: 136 mg/dL — ABNORMAL HIGH (ref 70–99)
Potassium: 3.7 mmol/L (ref 3.5–5.1)
Sodium: 138 mmol/L (ref 135–145)

## 2021-01-26 LAB — HEPATITIS C ANTIBODY: HCV Ab: NONREACTIVE

## 2021-01-26 MED ORDER — NICOTINE 21 MG/24HR TD PT24: 21.0000 mg | MEDICATED_PATCH | Freq: Every day | TRANSDERMAL | 0 refills | Status: DC

## 2021-01-26 MED ORDER — ISOSORBIDE MONONITRATE ER 30 MG PO TB24: 30.0000 mg | ORAL_TABLET | Freq: Every day | ORAL | 1 refills | Status: DC

## 2021-01-26 MED ORDER — CARVEDILOL 3.125 MG PO TABS: 3.1250 mg | ORAL_TABLET | Freq: Two times a day (BID) | ORAL | 1 refills | Status: DC

## 2021-01-26 MED ORDER — LOSARTAN POTASSIUM 25 MG PO TABS: 25.0000 mg | ORAL_TABLET | Freq: Every day | ORAL | 11 refills | Status: DC

## 2021-01-26 NOTE — Plan of Care (Signed)
Pt ready for discharge IV and tele removed.  Discharge instructions reviewed with patient Time allowed for questions and concerns Denies any additional wants or needs at this time Call bell within reach. Will continue to monitor.    Problem: Education: Goal: Knowledge of General Education information will improve Description: Including pain rating scale, medication(s)/side effects and non-pharmacologic comfort measures Outcome: Adequate for Discharge   Problem: Health Behavior/Discharge Planning: Goal: Ability to manage health-related needs will improve Outcome: Adequate for Discharge   Problem: Clinical Measurements: Goal: Ability to maintain clinical measurements within normal limits will improve Outcome: Adequate for Discharge Goal: Will remain free from infection Outcome: Adequate for Discharge Goal: Diagnostic test results will improve Outcome: Adequate for Discharge Goal: Respiratory complications will improve Outcome: Adequate for Discharge Goal: Cardiovascular complication will be avoided Outcome: Adequate for Discharge   Problem: Activity: Goal: Risk for activity intolerance will decrease Outcome: Adequate for Discharge   Problem: Nutrition: Goal: Adequate nutrition will be maintained Outcome: Adequate for Discharge   Problem: Coping: Goal: Level of anxiety will decrease Outcome: Adequate for Discharge   Problem: Elimination: Goal: Will not experience complications related to bowel motility Outcome: Adequate for Discharge Goal: Will not experience complications related to urinary retention Outcome: Adequate for Discharge   Problem: Pain Managment: Goal: General experience of comfort will improve Outcome: Adequate for Discharge   Problem: Safety: Goal: Ability to remain free from injury will improve Outcome: Adequate for Discharge   Problem: Skin Integrity: Goal: Risk for impaired skin integrity will decrease Outcome: Adequate for Discharge

## 2021-01-26 NOTE — Discharge Instructions (Signed)

## 2021-01-26 NOTE — Discharge Summary (Signed)
[name] BWI:[id number] DOB: [birth year] DOA: 01/23/2021  PCP: Patient, No Pcp Per  Admit date: 01/23/2021 Discharge date: 01/26/2021  Time spent: 35 minutes  Recommendations for Outpatient Follow-up:  1. Establish with a PCP 2. Cardiology follow-up 1-2 weeks, will need BMP/CBC     Discharge Diagnoses:  Principal Problem:   Chest pain Active Problems:   Duodenitis   Asthma   Hypertension   Elevated troponin   Tobacco abuse   Alcohol use   Rectal bleeding   Abnormal stress test   Alcohol abuse   Cocaine abuse ([place])   T2DM (type 2 diabetes mellitus) ([place])   Discharge Condition: stable  Diet recommendation: heart healthy carb conscious  Filed Weights   01/25/21 0522 01/25/21 1307 01/26/21 0412  Weight: 112.3 kg 112.3 kg 111.6 kg    History of present illness:  Logan Lucas is a 45 y.o. male with medical history significant of HTN, asthma, duodenitis, tobacco abuse, alcohol use, who presents with chest pain.   Pt states that he has been having chest pain in the past 3 days, which is located in the left side of the chest, sharp and pressure-like, radiating to the left arm, 8 out of 10 severity. It is associated with mild shortness breath. He has mild cough. No fever or chills.  He said he has been admitted to the hospital in the past for chest pain work-up but they were not able to tell him what was wrong. Patient states that he had mild diarrhea yesterday which has resolved.  Currently no nausea, vomiting, diarrhea or abdominal pain.  No symptoms of UTI. He also reports that he has pain in his right elbow but that it has been present for weeks or maybe months with no history of trauma and he does not think it is related. He is able to move his elbow and use his arm without difficulty. Patient states that he drinks vodka, 2 or 3 times each week, 2-3 shots each time. He reports a history of bright red blood in the toilet. He states that he has noticed bright red  blood recently, as well as dark blood in the toilet. He is uncertain if he has had any melena.   ED Course: pt was found to have troponin level 19, 22, pending COVID-19 PCR, electrolytes renal function okay, temperature normal, blood pressure 145/107, heart rate 95, 77, RR 27, 24, oxygen saturation 95% on room air, chest x-ray negative.  CT angiogram is negative for dissection.  Patient is placed on progressive bed for observation.  Dr. Fletcher Anon of cardiology is consulted.  Hospital Course:  #  Chest pain. With positive nuclear stress. LHC 3/2 no CAD, elevated filling pressures. TTE w diastolic dysfunction. Cocaine vasospasm is possible. CTA neg. CXR neg. Hx pancreatitis, says this pain is different though lipase mildly elevated to 56 - cardiology f/u 1-2 weeks - started imdur, losartan, carvedilol  #  Essential hypertension. Continue above meds  #  Alcohol abuse Drinks close to a bottle of liquor a day, denies hx complicated withdrawal, does endorse hx of withdrawal - monitored with CIWA, did not score - needs to establish w/ a PCP  # T2DM New dx, a1c 7.1 - outpt pcp f/u, needs to establish w/ pcp  # Rectal bleeding # GERD H wnl, seen by GI, advise outpt f/u - outpt pcp vs GI f/u  # Cocaine abuse - discussed dangers. hcv/hiv neg   Procedures: LHC Conclusions: 1. No angiographically significant coronary artery  disease. 2. Moderately to severely elevated left ventricular filling pressure (LVEDP 30-35 mmHg).  Recommendations: 1. Continue medical therapy for acute HFpEF and chest pain.  We will add isosorbide mononitrate 30 mg daily and administer furosemide 40 mg IV x1. 2. Continue primary prevention including smoking, marijuana, and cocaine cessation. 3. No indication for antiplatelet therapy in the setting of absence of coronary artery disease and concern for recent GI bleed.    Consultations:  cardiology  Discharge Exam: Vitals:   01/26/21 0735 01/26/21 1131   BP: (!) 132/96 129/87  Pulse: 83 86  Resp: 19 19  Temp: 98 F (36.7 C) 98 F (36.7 C)  SpO2: 99% 97%    General exam: Appears calm and comfortable  Respiratory system: Clear to auscultation. Respiratory effort normal. Cardiovascular system: S1 & S2 heard, RRR. No JVD, murmurs, rubs, gallops or clicks. No pedal edema. Gastrointestinal system: Abdomen is nondistended, soft and nontender. No organomegaly or masses felt. Normal bowel sounds heard. Central nervous system: Alert and oriented. No focal neurological deficits. Extremities: Symmetric 5 x 5 power. Skin: No rashes, lesions or ulcers Psychiatry: Judgement and insight appear normal. Mood & affect appropriate.   Discharge Instructions   Discharge Instructions    Diet - low sodium heart healthy   Complete by: As directed    Increase activity slowly   Complete by: As directed      Allergies as of 01/26/2021   No Known Allergies     Medication List    STOP taking these medications   metoprolol tartrate 25 MG tablet Commonly known as: LOPRESSOR     TAKE these medications   carvedilol 3.125 MG tablet Commonly known as: COREG Take 1 tablet (3.125 mg total) by mouth 2 (two) times daily with a meal.   cyclobenzaprine 5 MG tablet Commonly known as: FLEXERIL Take 1-2 tablets (5-10 mg total) by mouth 3 (three) times daily as needed for muscle spasms.   dicyclomine 10 MG capsule Commonly known as: Bentyl Take 1 capsule (10 mg total) by mouth 4 (four) times daily for 14 days.   famotidine 20 MG tablet Commonly known as: PEPCID Take 1 tablet (20 mg total) by mouth 2 (two) times daily for 14 days.   isosorbide mononitrate 30 MG 24 hr tablet Commonly known as: IMDUR Take 1 tablet (30 mg total) by mouth daily. Start taking on: January 27, 2021   losartan 25 MG tablet Commonly known as: Cozaar Take 1 tablet (25 mg total) by mouth daily.   nicotine 21 mg/24hr patch Commonly known as: NICODERM CQ - dosed in mg/24  hours Place 1 patch (21 mg total) onto the skin daily. Start taking on: January 27, 2021   omeprazole 20 MG capsule Commonly known as: PriLOSEC Take 1 capsule (20 mg total) by mouth daily for 14 days.   oxyCODONE 5 MG immediate release tablet Commonly known as: Oxy IR/ROXICODONE Take 1 tablet (5 mg total) by mouth every 4 (four) hours as needed for moderate pain.   prochlorperazine 5 MG tablet Commonly known as: COMPAZINE Take 1 tablet (5 mg total) by mouth every 8 (eight) hours as needed (headache, nausea).   sucralfate 1 g tablet Commonly known as: Carafate Take 1 tablet (1 g total) by mouth 4 (four) times daily for 5 days.   SUMAtriptan 50 MG tablet Commonly known as: Imitrex Take 1 tablet (50 mg total) by mouth once as needed for migraine. May repeat in 2 hours if headache persists or recurs.  No Known Allergies  Follow-up Information    Theora Gianotti, NP. Schedule an appointment as soon as possible for a visit.   Specialties: Nurse Practitioner, Cardiology, Radiology Contact information: [place] [address] [phone]        [facility] Follow up.   Contact information: [place] [address] [phone]                The results of significant diagnostics from this hospitalization (including imaging, microbiology, ancillary and laboratory) are listed below for reference.    Significant Diagnostic Studies: DG Chest 2 View  Result Date: 01/23/2021 CLINICAL DATA:  Left-sided chest EXAM: CHEST - 2 VIEW COMPARISON:  August 11, 2020 FINDINGS: The heart size and mediastinal contours are within normal limits. Both lungs are clear. The visualized skeletal structures are unremarkable. IMPRESSION: No active cardiopulmonary disease. Electronically Signed   By: Logan Pair M.D.   On: 01/23/2021 00:37   CARDIAC CATHETERIZATION  Result Date: 01/25/2021 Conclusions: 1. No  angiographically significant coronary artery disease. 2. Moderately to severely elevated left ventricular filling pressure (LVEDP 30-35 mmHg). Recommendations: 1. Continue medical therapy for acute HFpEF and chest pain.  We will add isosorbide mononitrate 30 mg daily and administer furosemide 40 mg IV x1. 2. Continue primary prevention including smoking, marijuana, and cocaine cessation. 3. No indication for antiplatelet therapy in the setting of absence of coronary artery disease and concern for recent GI bleed. Nelva Bush, MD [facility]   NM Myocar Multi W/Spect Tamela Oddi Motion / EF  Result Date: 01/24/2021  Abnormal, potentially high risk pharmacologic myocardial perfusion stress test.  The is a large in size, mild in severity, reversible defect involving the mid and apical anterior/anteroseptal segments as well as the apex. This may reflect ischemia, though an element of artifact cannot be excluded. This defect is new compared with prior study from 06/19/2018.  The left ventricular ejection fraction is moderately to severely reduced (QGS 28%, Siemens 44%). LVEF appears low normal to mildly reduced by visual estimation.  There is no significant coronary artery calcification. Hepatic steatosis is incidentally noted on the attenuation correction CT.    ECHOCARDIOGRAM COMPLETE  Result Date: 01/23/2021    ECHOCARDIOGRAM REPORT   Patient Name:   Logan Lucas Date of Exam: 01/23/2021 Medical Rec #:  [id number]          Height:       69.0 in Accession #:    [id number]         Weight:       240.0 lb Date of Birth:  11-21-1976         BSA:          2.232 m Patient Age:    11 years           BP:           119/91 mmHg Patient Gender: M                  HR:           72 bpm. Exam Location:  [facility] Procedure: 2D Echo, Color Doppler and Cardiac Doppler Indications:     R07.9 Chest Pain  History:         Patient has no prior history of Echocardiogram examinations.                  Risk Factors:Hypertension  and Current Smoker.  Sonographer:  Charmayne Sheer RDCS (AE) Referring Phys:  [id number] Arvil Chaco Diagnosing Phys: Kathlyn Sacramento MD  Sonographer Comments: Global longitudinal strain was attempted. IMPRESSIONS  1. Left ventricular ejection fraction, by estimation, is 55 to 60%. The left ventricle has normal function. The left ventricle has no regional wall motion abnormalities. There is mild left ventricular hypertrophy. Left ventricular diastolic parameters were normal. The average left ventricular global longitudinal strain is -14.8 %. The global longitudinal strain is abnormal.  2. Right ventricular systolic function is normal. The right ventricular size is normal. Tricuspid regurgitation signal is inadequate for assessing PA pressure.  3. The mitral valve is normal in structure. Mild mitral valve regurgitation. No evidence of mitral stenosis.  4. The aortic valve is normal in structure. Aortic valve regurgitation is not visualized. No aortic stenosis is present.  5. The inferior vena cava is normal in size with greater than 50% respiratory variability, suggesting right atrial pressure of 3 mmHg. FINDINGS  Left Ventricle: Left ventricular ejection fraction, by estimation, is 55 to 60%. The left ventricle has normal function. The left ventricle has no regional wall motion abnormalities. The average left ventricular global longitudinal strain is -14.8 %. The global longitudinal strain is abnormal. The left ventricular internal cavity size was normal in size. There is mild left ventricular hypertrophy. Left ventricular diastolic parameters were normal. Right Ventricle: The right ventricular size is normal. No increase in right ventricular wall thickness. Right ventricular systolic function is normal. Tricuspid regurgitation signal is inadequate for assessing PA pressure. Left Atrium: Left atrial size was normal in size. Right Atrium: Right atrial size was normal in size. Pericardium: There is no evidence of  pericardial effusion. Mitral Valve: The mitral valve is normal in structure. Mild mitral valve regurgitation. No evidence of mitral valve stenosis. MV peak gradient, 3.1 mmHg. The mean mitral valve gradient is 2.0 mmHg. Tricuspid Valve: The tricuspid valve is normal in structure. Tricuspid valve regurgitation is not demonstrated. No evidence of tricuspid stenosis. Aortic Valve: The aortic valve is normal in structure. Aortic valve regurgitation is not visualized. No aortic stenosis is present. Aortic valve mean gradient measures 3.0 mmHg. Aortic valve peak gradient measures 5.3 mmHg. Aortic valve area, by VTI measures 2.92 cm. Pulmonic Valve: The pulmonic valve was normal in structure. Pulmonic valve regurgitation is not visualized. No evidence of pulmonic stenosis. Aorta: The aortic root is normal in size and structure. Venous: The inferior vena cava is normal in size with greater than 50% respiratory variability, suggesting right atrial pressure of 3 mmHg. IAS/Shunts: No atrial level shunt detected by color flow Doppler.  LEFT VENTRICLE PLAX 2D LVIDd:         5.20 cm  Diastology LVIDs:         3.80 cm  LV e' medial:    7.07 cm/s LV PW:         1.10 cm  LV E/e' medial:  10.8 LV IVS:        1.10 cm  LV e' lateral:   9.90 cm/s LVOT diam:     2.10 cm  LV E/e' lateral: 7.7 LV SV:         64 LV SV Index:   29       2D Longitudinal Strain LVOT Area:     3.46 cm 2D Strain GLS Avg:     -14.8 %  RIGHT VENTRICLE RV Basal diam:  3.70 cm TAPSE (M-mode): 2.8 cm LEFT ATRIUM  Index       RIGHT ATRIUM           Index LA diam:        3.80 cm 1.70 cm/m  RA Area:     16.10 cm LA Vol (A2C):   59.6 ml 26.70 ml/m RA Volume:   38.60 ml  17.29 ml/m LA Vol (A4C):   47.7 ml 21.37 ml/m LA Biplane Vol: 55.8 ml 24.99 ml/m  AORTIC VALVE                   PULMONIC VALVE AV Area (Vmax):    2.97 cm    PV Vmax:       0.98 m/s AV Area (Vmean):   2.96 cm    PV Vmean:      71.400 cm/s AV Area (VTI):     2.92 cm    PV VTI:         0.211 m AV Vmax:           115.00 cm/s PV Peak grad:  3.8 mmHg AV Vmean:          80.800 cm/s PV Mean grad:  2.0 mmHg AV VTI:            0.218 m AV Peak Grad:      5.3 mmHg AV Mean Grad:      3.0 mmHg LVOT Vmax:         98.60 cm/s LVOT Vmean:        69.000 cm/s LVOT VTI:          0.184 m LVOT/AV VTI ratio: 0.84  AORTA Ao Root diam: 3.60 cm MITRAL VALVE MV Area (PHT): 3.81 cm    SHUNTS MV Area VTI:   2.91 cm    Systemic VTI:  0.18 m MV Peak grad:  3.1 mmHg    Systemic Diam: 2.10 cm MV Mean grad:  2.0 mmHg MV Vmax:       0.88 m/s MV Vmean:      61.5 cm/s MV Decel Time: 199 msec MV E velocity: 76.30 cm/s MV A velocity: 59.60 cm/s MV E/A ratio:  1.28 Kathlyn Sacramento MD Electronically signed by Kathlyn Sacramento MD Signature Date/Time: 01/23/2021/5:19:29 PM    Final    CT Angio Chest/Abd/Pel for Dissection W and/or W/WO  Result Date: 01/23/2021 CLINICAL DATA:  45 year old male with chest pain for 3 days. EXAM: CT ANGIOGRAPHY CHEST, ABDOMEN AND PELVIS TECHNIQUE: Chest CTA 06/17/2018.  CT Abdomen and Pelvis 05/07/2020, 08/31/2020. Multidetector CT imaging through the chest, abdomen and pelvis was performed using the standard protocol during bolus administration of intravenous contrast. Multiplanar reconstructed images and MIPs were obtained and reviewed to evaluate the vascular anatomy. CONTRAST:  146mL OMNIPAQUE IOHEXOL 350 MG/ML SOLN COMPARISON:  CTA chest 06/17/2018. CT Abdomen and Pelvis 08/31/2020 and earlier. FINDINGS: CTA CHEST FINDINGS Cardiovascular: Stable cardiac size at the upper limits of normal. No pericardial effusion. No calcified atherosclerosis in the chest. Negative thoracic aorta, no dissection or aneurysm. Central pulmonary arteries appear to be patent. Mediastinum/Nodes: Negative.  No lymphadenopathy. Lungs/Pleura: Major airways are patent. Lung volumes appear stable and within normal limits. Both lungs appear negative. No pleural effusion. Musculoskeletal: Stable, negative. Review of the MIP images  confirms the above findings. CTA ABDOMEN AND PELVIS FINDINGS VASCULAR Negative for abdominal aortic aneurysm or dissection. Major arterial structures in the abdomen and pelvis are patent and appear normal aside from minimal atherosclerosis. Review of the MIP images confirms the above findings. NON-VASCULAR Hepatobiliary:  Chronic severe back steatosis. Negative gallbladder. No bile duct enlargement. Pancreas: Negative. Spleen: Negative. Adrenals/Urinary Tract: Bilateral adrenal nodularity is stable since 2019 compatible with benign adrenal adenomas (series 5, image 100). Both kidneys appear stable and within normal limits, small left renal upper pole cyst. No perinephric stranding or hydronephrosis. Renal enhancement is within normal limits. Decompressed ureters. Unremarkable urinary bladder. Stomach/Bowel: No dilated or inflamed large or small bowel. Normal appendix (series 5, image 146). There is perhaps mildly increased fluid in the colon. Stomach and duodenum appear negative. No free air or free fluid. Lymphatic: No lymphadenopathy. Reproductive: Negative. Other: No pelvic free fluid. Musculoskeletal: Stable, negative. Review of the MIP images confirms the above findings. IMPRESSION: 1. Negative for aortic dissection or aneurysm. Minimal atherosclerosis. 2. No acute or inflammatory process identified in the chest, abdomen, and pelvis. 3. Chronic severe hepatic steatosis. 4. Chronic benign adrenal adenomas. Electronically Signed   By: Genevie Ann M.D.   On: 01/23/2021 07:30    Microbiology: Recent Results (from the past 240 hour(s))  Resp Panel by RT-PCR (Flu A&B, Covid) Nasopharyngeal Swab     Status: None   Collection Time: 01/23/21  8:04 AM   Specimen: Nasopharyngeal Swab; Nasopharyngeal(NP) swabs in vial transport medium  Result Value Ref Range Status   SARS Coronavirus 2 by RT PCR NEGATIVE NEGATIVE Final    Comment: (NOTE) SARS-CoV-2 target nucleic acids are NOT DETECTED.  The SARS-CoV-2 RNA is  generally detectable in upper respiratory specimens during the acute phase of infection. The lowest concentration of SARS-CoV-2 viral copies this assay can detect is 138 copies/mL. A negative result does not preclude SARS-Cov-2 infection and should not be used as the sole basis for treatment or other patient management decisions. A negative result may occur with  improper specimen collection/handling, submission of specimen other than nasopharyngeal swab, presence of viral mutation(s) within the areas targeted by this assay, and inadequate number of viral copies(<138 copies/mL). A negative result must be combined with clinical observations, patient history, and epidemiological information. The expected result is Negative.  Fact Sheet for Patients:  EntrepreneurPulse.com.au  Fact Sheet for Healthcare Providers:  IncredibleEmployment.be  This test is no t yet approved or cleared by the [place] FDA and  has been authorized for detection and/or diagnosis of SARS-CoV-2 by FDA under an Emergency Use Authorization (EUA). This EUA will remain  in effect (meaning this test can be used) for the duration of the COVID-19 declaration under Section 564(b)(1) of the Act, 21 U.S.C.section 360bbb-3(b)(1), unless the authorization is terminated  or revoked sooner.       Influenza A by PCR NEGATIVE NEGATIVE Final   Influenza B by PCR NEGATIVE NEGATIVE Final    Comment: (NOTE) The Xpert Xpress SARS-CoV-2/FLU/RSV plus assay is intended as an aid in the diagnosis of influenza from Nasopharyngeal swab specimens and should not be used as a sole basis for treatment. Nasal washings and aspirates are unacceptable for Xpert Xpress SARS-CoV-2/FLU/RSV testing.  Fact Sheet for Patients: EntrepreneurPulse.com.au  Fact Sheet for Healthcare Providers: IncredibleEmployment.be  This test is not yet approved or cleared by the [place] FDA and has been authorized for detection and/or diagnosis of SARS-CoV-2 by FDA under an Emergency Use Authorization (EUA). This EUA will remain in effect (meaning this test can be used) for the duration of the COVID-19 declaration under Section 564(b)(1) of the Act, 21 U.S.C. section 360bbb-3(b)(1), unless the authorization is terminated or revoked.  Performed at [facility], [address] [place],  [place] [address]      Labs: Basic Metabolic Panel: Recent Labs  Lab 01/23/21 0121 01/24/21 0600 01/26/21 0445  NA 135 139 138  K 3.9 3.9 3.7  CL 107 111 107  CO2 19* 23 24  GLUCOSE 132* 155* 136*  BUN 7 9 11   CREATININE 0.86 0.84 0.91  CALCIUM 8.4* 8.3* 8.6*   Liver Function Tests: Recent Labs  Lab 01/23/21 0121  AST 22  ALT 18  ALKPHOS 74  BILITOT 0.7  PROT 6.4*  ALBUMIN 3.6   Recent Labs  Lab 01/25/21 1529  LIPASE 56*   No results for input(s): AMMONIA in the last 168 hours. CBC: Recent Labs  Lab 01/24/21 0600 01/24/21 1148 01/24/21 1727 01/24/21 2317 01/25/21 0442  WBC 5.9 6.3 7.5 8.0 7.0  HGB 13.4 14.1 13.0 12.7* 12.8*  HCT 40.7 42.3 40.1 39.2 39.0  MCV 89.8 88.5 90.1 89.9 90.1  PLT 249 255 274 250 264   Cardiac Enzymes: No results for input(s): CKTOTAL, CKMB, CKMBINDEX, TROPONINI in the last 168 hours. BNP: BNP (last 3 results) No results for input(s): BNP in the last 8760 hours.  ProBNP (last 3 results) No results for input(s): PROBNP in the last 8760 hours.  CBG: No results for input(s): GLUCAP in the last 168 hours.     Signed:  Desma Maxim MD.  [facility] 01/26/2021, 12:32 PM

## 2021-01-26 NOTE — Progress Notes (Addendum)
Progress Note  Patient Name: Logan Lucas Date of Encounter: 01/26/2021  Primary Cardiologist: Kathlyn Sacramento, MD  Subjective   Cardiac cath yesterday shows no significant CAD. Patient c/o chest pain 8/10 and right wrist pain - both reproducible w/ palpation.  Inpatient Medications    Scheduled Meds:  carvedilol  3.125 mg Oral BID WC   enoxaparin (LOVENOX) injection  0.5 mg/kg Subcutaneous J69C   folic acid  1 mg Oral Daily   isosorbide mononitrate  30 mg Oral Daily   LORazepam  0-4 mg Intravenous Q12H   melatonin  5 mg Oral QHS   multivitamin with minerals  1 tablet Oral Daily   nicotine  21 mg Transdermal Daily   pantoprazole  40 mg Oral BID   sodium chloride flush  10 mL Intravenous Q12H   sodium chloride flush  3 mL Intravenous Q12H   sodium chloride flush  3 mL Intravenous Q12H   sucralfate  1 g Oral QID   thiamine  100 mg Oral Daily   Or   thiamine  100 mg Intravenous Daily   Continuous Infusions:  sodium chloride     PRN Meds: sodium chloride, acetaminophen, albuterol, dextromethorphan-guaiFENesin, hydrALAZINE, LORazepam **OR** LORazepam, morphine injection, morphine injection, nitroGLYCERIN, ondansetron (ZOFRAN) IV, sodium chloride flush   Vital Signs    Vitals:   01/25/21 1639 01/25/21 2031 01/26/21 0412 01/26/21 0735  BP: (!) 146/98 117/81 111/84 (!) 132/96  Pulse: 84 89 75 83  Resp: 19 14 18 19   Temp: 97.8 F (36.6 C) 98.2 F (36.8 C) (!) 97.4 F (36.3 C) 98 F (36.7 C)  TempSrc: Oral Oral Axillary   SpO2: 100% 99% 97% 99%  Weight:   111.6 kg   Height:        Intake/Output Summary (Last 24 hours) at 01/26/2021 1032 Last data filed at 01/26/2021 1000 Gross per 24 hour  Intake 1160 ml  Output 2250 ml  Net -1090 ml   Filed Weights   01/25/21 0522 01/25/21 1307 01/26/21 0412  Weight: 112.3 kg 112.3 kg 111.6 kg    Physical Exam   GEN: Obese, in no acute distress.  HEENT: Grossly normal.  Neck: Supple, no JVD, carotid bruits, or  masses. Cardiac: RRR, no murmurs, rubs, or gallops. No clubbing, cyanosis, edema.  Radials 2+, DP/PT 2+ and equal bilaterally. Right radial cath site without erythema, swelling, bleeding. Bandage intact. No bruit auscultated.  Respiratory:  Respirations regular and unlabored, clear to auscultation bilaterally. GI: Soft, obese, nontender, nondistended, BS + x 4. MS: no deformity or atrophy. Skin: warm and dry, no rash. Neuro:  Strength and sensation are intact. Psych: AAOx3.  Normal affect.  Labs    Chemistry Recent Labs  Lab 01/23/21 0121 01/24/21 0600 01/26/21 0445  NA 135 139 138  K 3.9 3.9 3.7  CL 107 111 107  CO2 19* 23 24  GLUCOSE 132* 155* 136*  BUN 7 9 11   CREATININE 0.86 0.84 0.91  CALCIUM 8.4* 8.3* 8.6*  PROT 6.4*  --   --   ALBUMIN 3.6  --   --   AST 22  --   --   ALT 18  --   --   ALKPHOS 74  --   --   BILITOT 0.7  --   --   GFRNONAA >60 >60 >60  ANIONGAP 9 5 7      Hematology Recent Labs  Lab 01/24/21 1727 01/24/21 2317 01/25/21 0442  WBC 7.5 8.0 7.0  RBC 4.45  4.36 4.33  HGB 13.0 12.7* 12.8*  HCT 40.1 39.2 39.0  MCV 90.1 89.9 90.1  MCH 29.2 29.1 29.6  MCHC 32.4 32.4 32.8  RDW 13.7 13.6 13.7  PLT 274 250 264    Cardiac Enzymes  Recent Labs  Lab 01/23/21 0121 01/23/21 0346 01/23/21 0826 01/23/21 1147 01/23/21 1508  TROPONINIHS 19* 22* 18* 15 14      Lipids  Lab Results  Component Value Date   CHOL 165 01/24/2021   HDL 35 (L) 01/24/2021   LDLCALC 93 01/24/2021   TRIG 187 (H) 01/24/2021   CHOLHDL 4.7 01/24/2021    HbA1c  Lab Results  Component Value Date   HGBA1C 7.1 (H) 01/24/2021    Radiology    DG Chest 2 View  Result Date: 01/23/2021 CLINICAL DATA:  Left-sided chest EXAM: CHEST - 2 VIEW COMPARISON:  August 11, 2020 FINDINGS: The heart size and mediastinal contours are within normal limits. Both lungs are clear. The visualized skeletal structures are unremarkable. IMPRESSION: No active cardiopulmonary disease.  Electronically Signed   By: Prudencio Pair M.D.   On: 01/23/2021 00:37   CT Angio Chest/Abd/Pel for Dissection W and/or W/WO  Result Date: 01/23/2021 CLINICAL DATA:  45 year old male with chest pain for 3 days. EXAM: CT ANGIOGRAPHY CHEST, ABDOMEN AND PELVIS TECHNIQUE: Chest CTA 06/17/2018.  CT Abdomen and Pelvis 05/07/2020, 08/31/2020. Multidetector CT imaging through the chest, abdomen and pelvis was performed using the standard protocol during bolus administration of intravenous contrast. Multiplanar reconstructed images and MIPs were obtained and reviewed to evaluate the vascular anatomy. CONTRAST:  170mL OMNIPAQUE IOHEXOL 350 MG/ML SOLN COMPARISON:  CTA chest 06/17/2018. CT Abdomen and Pelvis 08/31/2020 and earlier. FINDINGS: CTA CHEST FINDINGS Cardiovascular: Stable cardiac size at the upper limits of normal. No pericardial effusion. No calcified atherosclerosis in the chest. Negative thoracic aorta, no dissection or aneurysm. Central pulmonary arteries appear to be patent. Mediastinum/Nodes: Negative.  No lymphadenopathy. Lungs/Pleura: Major airways are patent. Lung volumes appear stable and within normal limits. Both lungs appear negative. No pleural effusion. Musculoskeletal: Stable, negative. Review of the MIP images confirms the above findings. CTA ABDOMEN AND PELVIS FINDINGS VASCULAR Negative for abdominal aortic aneurysm or dissection. Major arterial structures in the abdomen and pelvis are patent and appear normal aside from minimal atherosclerosis. Review of the MIP images confirms the above findings. NON-VASCULAR Hepatobiliary: Chronic severe back steatosis. Negative gallbladder. No bile duct enlargement. Pancreas: Negative. Spleen: Negative. Adrenals/Urinary Tract: Bilateral adrenal nodularity is stable since 2019 compatible with benign adrenal adenomas (series 5, image 100). Both kidneys appear stable and within normal limits, small left renal upper pole cyst. No perinephric stranding or  hydronephrosis. Renal enhancement is within normal limits. Decompressed ureters. Unremarkable urinary bladder. Stomach/Bowel: No dilated or inflamed large or small bowel. Normal appendix (series 5, image 146). There is perhaps mildly increased fluid in the colon. Stomach and duodenum appear negative. No free air or free fluid. Lymphatic: No lymphadenopathy. Reproductive: Negative. Other: No pelvic free fluid. Musculoskeletal: Stable, negative. Review of the MIP images confirms the above findings. IMPRESSION: 1. Negative for aortic dissection or aneurysm. Minimal atherosclerosis. 2. No acute or inflammatory process identified in the chest, abdomen, and pelvis. 3. Chronic severe hepatic steatosis. 4. Chronic benign adrenal adenomas. Electronically Signed   By: Genevie Ann M.D.   On: 01/23/2021 07:30    Telemetry    RSR @ 90 bpm with frequent PACs -  Personally Reviewed  Cardiac Studies   Echo 01/23/2021 IMPRESSIONS  1. Left ventricular ejection fraction, by estimation, is 55 to 60%. The  left ventricle has normal function. The left ventricle has no regional  wall motion abnormalities. There is mild left ventricular hypertrophy.  Left ventricular diastolic parameters  were normal. The average left ventricular global longitudinal strain is  -14.8 %. The global longitudinal strain is abnormal.   2. Right ventricular systolic function is normal. The right ventricular  size is normal. Tricuspid regurgitation signal is inadequate for assessing  PA pressure.   3. The mitral valve is normal in structure. Mild mitral valve  regurgitation. No evidence of mitral stenosis.   4. The aortic valve is normal in structure. Aortic valve regurgitation is  not visualized. No aortic stenosis is present.   5. The inferior vena cava is normal in size with greater than 50%  respiratory variability, suggesting right atrial pressure of 3 mmHg.    Nuclear Stress Test 01/24/2021  Abnormal, potentially high risk  pharmacologic myocardial perfusion stress test. The is a large in size, mild in severity, reversible defect involving the mid and apical anterior/anteroseptal segments as well as the apex. This may reflect ischemia, though an element of artifact cannot be excluded. This defect is new compared with prior study from 06/19/2018. The left ventricular ejection fraction is moderately to severely reduced (QGS 28%, Siemens 44%). LVEF appears low normal to mildly reduced by visual estimation. There is no significant coronary artery calcification. Hepatic steatosis is incidentally noted on the attenuation correction CT.   Left Heart Cath 01/25/2021  Conclusions: No angiographically significant coronary artery disease. Moderately to severely elevated left ventricular filling pressure (LVEDP 30-35 mmHg).   Recommendations: Continue medical therapy for acute HFpEF and chest pain.  We will add isosorbide mononitrate 30 mg daily and administer furosemide 40 mg IV x1. Continue primary prevention including smoking, marijuana, and cocaine cessation. No indication for antiplatelet therapy in the setting of absence of coronary artery disease and concern for recent GI bleed.   Patient Profile     45 y.o. male with hx of current tobacco use, marijuana use, alcohol use, cocaine use, HTN, h/o of GIB who is being evaluated for chest pain, HFpEF.   Assessment & Plan    1. HFpEF: LVEF 55-60% by echo 01/23/21. History of hypertension not well controlled. Previously on metoprolol tartrate prior to admission and reports he would consistently have elevated BP at home. He does not have PCP. Was eating an unrestricted diet prior to admission. He received 1 dose of IV Lasix 40 mg after cath with good urine output. Appears euvolemic on exam. On carvedilol, prn hydralazine. Isosorbide mononitrate has been added for chest pain/poss coronary spasm in setting of cocaine use. No early satiety, PND, orthopnea. Will add losartan 25 mg  daily.  Advised good BP control, heart healthy, low sodium diet and exercise. Will see him in clinic on 3/14.  2. Hypertension: BP significantly elevated on admission, currently 129/87 . He was taking metoprolol tartrate at home, prescribed by ED provider, no PCP. He has been switched from metoprolol to carvedilol and started on isosorbide for chest pain/possible coronary spasm in the setting of cocaine use. Will continue these medications and add losartan 25 mg daily. Will see him as outpatient in 1-2 weeks for BP management and BMET.   3. Non-cardiac chest pain:  He reports pain is 8/29, worse with certain movement and with palpation. States it is difficult to get comfortable at night and sometimes he wakes up in pain.  Troponin  minimally elevated. LHC on 3/2 shows no significant CAD. GDMT for HFpEF and chest pain will include addition of isosorbide mononitrate 30 mg daily. Will continue ? blocker. Discussed MSK nature of c/p w/ him.   4. Polysubstance abuse: Complete cessation advised. He reports he was smoking less for about 2 weeks before his admission. He is having some dreams of craving drugs but these have been well managed with Ativan. He is not interested in receiving additional information regarding tobacco/alcohol/elicit drug cessation at this time.   5. History of GI bleed: H & H stable. There is no indication for anti-platelet or blood thinning medications at this time. Patient needs to get established with PCP. Will follow CBC as outpatient.   Signed, Murray Hodgkins, NP  01/26/2021, 10:32 AM     Patient was discharged before I had a chance to round Has f/u in clinic with Berge/end TGollan For questions or updates, please contact   Please consult [email or web address] for contact info under Cardiology/STEMI.

## 2021-02-05 NOTE — Progress Notes (Deleted)
Cardiology Office Note    Date:  02/05/2021   ID:  [name], DOB August 20, 1976, MRN [id number]  PCP:  Patient, No Pcp Per  Cardiologist:  Kathlyn Sacramento, MD  Electrophysiologist:  None   Chief Complaint: Hospital follow up  History of Present Illness:   Logan Lucas is a 45 y.o. male with history of HFpEF, recurrent GI bleeding of uncertain etiology, DM2, polysubstance use including cocaine, alcohol, and tobacco use, hepatic steatosis, HTN  He previously underwent echo during admission in 05/2018 for chest pain with negative troponins.  Echo showed an EF of 55-60%, no RWMA, normal LV diastolic function parameters, normal RVSF and ventricular cavity size, and trivial mitral regurgitation.  Nuclear stress test was low risk.  He was seen in the [facility] ED in 08/2020 and again in 11/2020 with GI bleeding without prior GI evaluation.  CT imaging in 08/2020 was without acute abnormalities.  He was treated in the ED with Carafate, Bentyl, and Pepcid.  He was advised to follow-up with GI.  He was admitted to the hospital from 2/28 to 3/3 for acute HFpEF and noncardiac chest pain.  He was noted to have a history of hypertension that was not well controlled and was eating a diet unrestricted and sodium.  Urine drug screen was positive for cocaine.  High-sensitivity troponin of 19 with a delta and peak of 22 subsequently downtrending.  CTA of the chest/abdomen/pelvis was negative for aortic dissection or aneurysm with minimal aortic atherosclerosis.  There was no acute or inflammatory process identified in the chest, abdomen, or pelvis.  Chronic severe hepatic steatosis was noted as well as chronic benign adrenal adenomas.  Echo showed an EF of 55 to 60%, no regional wall motion abnormalities, mild LVH, normal LV diastolic function parameters, normal RV systolic function and ventricular cavity size, mild mitral regurgitation.  He underwent Lexiscan MPI on 01/24/2021 which was abnormal and  potentially high risk with a large in size, mild in severity reversible defect involving the mid and apical anterior/anteroseptal segments as well as the apex.  These findings were felt to possibly reflect ischemia, though an element of artifact could not be excluded.  This defect was new when compared to study from 05/2018.  EF was moderately to severely reduced on MPI though normal by echo as outlined above.  There was no significant coronary artery calcification.  Hepatic steatosis was incidentally noted.  Given abnormal Myoview he underwent LHC on 01/25/2021 showed no angiographically significant CAD with moderately to severely elevated LVEDP of 30 to 35 mmHg.  Continued medical therapy for acute HFpEF and chest pain was recommended.  No indication for antiplatelet therapy in the setting of absence of coronary artery disease and with concern for recent GI bleed.  He was not interested in receiving additional information regarding substance abuse cessation.  Chest pain was reproducible to palpation indicating likely musculoskeletal etiology.  ***   Labs independently reviewed: 01/2021 - potassium 3.7, BUN 11, serum creatinine 0.91, Hgb 12.8, PLT 264, TC 165, TG 187, HDL 35, LDL 93, A1c 7.1 12/2020 - albumin 3.6, AST/ALT normal  Past Medical History:  Diagnosis Date  . Asthma   . GI bleed   . Hypertension     Past Surgical History:  Procedure Laterality Date  . LEFT HEART CATH AND CORONARY ANGIOGRAPHY N/A 01/25/2021   Procedure: LEFT HEART CATH AND CORONARY ANGIOGRAPHY;  Surgeon: Nelva Bush, MD;  Location: [facility];  Service: Cardiovascular;  Laterality: N/A;  .  none      Current Medications: No outpatient medications have been marked as taking for the 02/06/21 encounter (Appointment) with Rise Mu, PA-C.    Allergies:   Patient has no known allergies.   Social History   Socioeconomic History  . Marital status: Single    Spouse name: Not on file  . Number of  children: Not on file  . Years of education: Not on file  . Highest education level: Not on file  Occupational History  . Occupation: catering  Tobacco Use  . Smoking status: Current Every Day Smoker    Packs/day: 1.00    Years: 5.00    Pack years: 5.00    Types: Cigarettes  . Smokeless tobacco: Never Used  Vaping Use  . Vaping Use: Never used  Substance and Sexual Activity  . Alcohol use: Yes    Alcohol/week: 7.0 standard drinks    Types: 2 Cans of beer, 5 Shots of liquor per week    Comment: weekends  . Drug use: No    Frequency: 1.0 times per week  . Sexual activity: Yes    Birth control/protection: Rhythm  Other Topics Concern  . Not on file  Social History Narrative  . Not on file   Social Determinants of Health   Financial Resource Strain: Not on file  Food Insecurity: Not on file  Transportation Needs: Not on file  Physical Activity: Not on file  Stress: Not on file  Social Connections: Not on file     Family History:  The patient's family history includes Cancer in his father; Diabetes in his mother.  ROS:   ROS   EKGs/Labs/Other Studies Reviewed:    Studies reviewed were summarized above. The additional studies were reviewed today:  2D echo 05/2018 [facility]): - Left ventricle: The cavity size was normal. Wall thickness was  normal. Systolic function was normal. The estimated ejection  fraction was in the range of 55% to 60%. Wall motion was normal;  there were no regional wall motion abnormalities. Left  ventricular diastolic function parameters were normal.  - Aortic valve: Valve area (VTI): 2.83 cm^2. Valve area (Vmax):  3.19 cm^2. Valve area (Vmean): 2.87 cm^2.  - Mitral valve: Valve area by continuity equation (using LVOT  flow): 3.31 cm^2.  __________  2D echo 12/2020: 1. Left ventricular ejection fraction, by estimation, is 55 to 60%. The  left ventricle has normal function. The left ventricle has no regional  wall motion  abnormalities. There is mild left ventricular hypertrophy.  Left ventricular diastolic parameters  were normal. The average left ventricular global longitudinal strain is  -14.8 %. The global longitudinal strain is abnormal.  2. Right ventricular systolic function is normal. The right ventricular  size is normal. Tricuspid regurgitation signal is inadequate for assessing  PA pressure.  3. The mitral valve is normal in structure. Mild mitral valve  regurgitation. No evidence of mitral stenosis.  4. The aortic valve is normal in structure. Aortic valve regurgitation is  not visualized. No aortic stenosis is present.  5. The inferior vena cava is normal in size with greater than 50%  respiratory variability, suggesting right atrial pressure of 3 mmHg. ___________  LHC 01/2021: Conclusions: 1. No angiographically significant coronary artery disease. 2. Moderately to severely elevated left ventricular filling pressure (LVEDP 30-35 mmHg).  Recommendations: 1. Continue medical therapy for acute HFpEF and chest pain.  We will add isosorbide mononitrate 30 mg daily and administer furosemide 40 mg IV x1. 2.  Continue primary prevention including smoking, marijuana, and cocaine cessation. 3. No indication for antiplatelet therapy in the setting of absence of coronary artery disease and concern for recent GI bleed.    EKG:  EKG is ordered today.  The EKG ordered today demonstrates ***  Recent Labs: 01/23/2021: ALT 18 01/25/2021: Hemoglobin 12.8; Platelets 264 01/26/2021: BUN 11; Creatinine, Ser 0.91; Potassium 3.7; Sodium 138  Recent Lipid Panel    Component Value Date/Time   CHOL 165 01/24/2021 0600   TRIG 187 (H) 01/24/2021 0600   HDL 35 (L) 01/24/2021 0600   CHOLHDL 4.7 01/24/2021 0600   VLDL 37 01/24/2021 0600   LDLCALC 93 01/24/2021 0600    PHYSICAL EXAM:    VS:  There were no vitals taken for this visit.  BMI: There is no height or weight on file to calculate BMI.  Physical  Exam  Wt Readings from Last 3 Encounters:  01/26/21 246 lb 0.5 oz (111.6 kg)  12/02/20 245 lb (111.1 kg)  08/31/20 253 lb (114.8 kg)     ASSESSMENT & PLAN:   1. HFpEF:  2. Elevated troponin:  3. HTN: Blood pressure ***  4. Polysubstance abuse:  5. History of GI bleeding/hepatic steatosis:  6. ***  Disposition: F/u with Dr. Fletcher Anon or an APP in ***.   Medication Adjustments/Labs and Tests Ordered: Current medicines are reviewed at length with the patient today.  Concerns regarding medicines are outlined above. Medication changes, Labs and Tests ordered today are summarized above and listed in the Patient Instructions accessible in Encounters.   Signed, Christell Faith, PA-C 02/05/2021 9:27 AM     [facility] [address] [place] [address] [place], [place] [address] [phone]

## 2021-02-06 ENCOUNTER — Ambulatory Visit: Payer: Self-pay | Admitting: Physician Assistant

## 2021-02-07 ENCOUNTER — Encounter: Payer: Self-pay | Admitting: Physician Assistant

## 2022-05-24 ENCOUNTER — Encounter (HOSPITAL_COMMUNITY): Payer: Self-pay | Admitting: Emergency Medicine

## 2022-05-24 ENCOUNTER — Inpatient Hospital Stay (HOSPITAL_COMMUNITY)
Admission: EM | Admit: 2022-05-24 | Discharge: 2022-05-27 | DRG: 637 | Disposition: A | Discharge status: Home / Self Care | Payer: Self-pay | Attending: Internal Medicine | Admitting: Internal Medicine

## 2022-05-24 ENCOUNTER — Emergency Department (HOSPITAL_COMMUNITY): Payer: Self-pay

## 2022-05-24 ENCOUNTER — Other Ambulatory Visit: Payer: Self-pay

## 2022-05-24 DIAGNOSIS — Z886 Allergy status to analgesic agent status: Secondary | ICD-10-CM

## 2022-05-24 DIAGNOSIS — Z833 Family history of diabetes mellitus: Secondary | ICD-10-CM

## 2022-05-24 DIAGNOSIS — R739 Hyperglycemia, unspecified: Principal | ICD-10-CM

## 2022-05-24 DIAGNOSIS — I5032 Chronic diastolic (congestive) heart failure: Secondary | ICD-10-CM | POA: Diagnosis present

## 2022-05-24 DIAGNOSIS — E781 Pure hyperglyceridemia: Secondary | ICD-10-CM | POA: Diagnosis present

## 2022-05-24 DIAGNOSIS — K859 Acute pancreatitis without necrosis or infection, unspecified: Secondary | ICD-10-CM | POA: Diagnosis present

## 2022-05-24 DIAGNOSIS — R109 Unspecified abdominal pain: Secondary | ICD-10-CM

## 2022-05-24 DIAGNOSIS — E1110 Type 2 diabetes mellitus with ketoacidosis without coma: Principal | ICD-10-CM | POA: Diagnosis present

## 2022-05-24 DIAGNOSIS — F17210 Nicotine dependence, cigarettes, uncomplicated: Secondary | ICD-10-CM | POA: Diagnosis present

## 2022-05-24 DIAGNOSIS — J45909 Unspecified asthma, uncomplicated: Secondary | ICD-10-CM | POA: Diagnosis present

## 2022-05-24 DIAGNOSIS — Z79899 Other long term (current) drug therapy: Secondary | ICD-10-CM

## 2022-05-24 DIAGNOSIS — E1165 Type 2 diabetes mellitus with hyperglycemia: Secondary | ICD-10-CM | POA: Diagnosis present

## 2022-05-24 DIAGNOSIS — I110 Hypertensive heart disease with heart failure: Secondary | ICD-10-CM | POA: Diagnosis present

## 2022-05-24 LAB — URINALYSIS, ROUTINE W REFLEX MICROSCOPIC
Bacteria, UA: NONE SEEN
Bilirubin Urine: NEGATIVE
Glucose, UA: >=500 mg/dL — AB
Hgb urine dipstick: NEGATIVE
Ketones, ur: NEGATIVE mg/dL
Leukocytes,Ua: NEGATIVE
Nitrite: NEGATIVE
Protein, ur: NEGATIVE mg/dL
Specific Gravity, Urine: 1.031 — ABNORMAL HIGH (ref 1.005–1.030)
pH: 5 (ref 5.0–8.0)

## 2022-05-24 LAB — BASIC METABOLIC PANEL
Anion gap: 12 (ref 5–15)
BUN: 9 mg/dL (ref 6–20)
CO2: 22 mmol/L (ref 22–32)
Calcium: 9.8 mg/dL (ref 8.9–10.3)
Chloride: 99 mmol/L (ref 98–111)
Creatinine, Ser: 0.77 mg/dL (ref 0.61–1.24)
GFR, Estimated: >60 mL/min (ref >60)
Glucose, Bld: 172 mg/dL — ABNORMAL HIGH (ref 70–99)
Potassium: 3.9 mmol/L (ref 3.5–5.1)
Sodium: 133 mmol/L — ABNORMAL LOW (ref 135–145)

## 2022-05-24 LAB — COMPREHENSIVE METABOLIC PANEL
ALT: 30 U/L (ref 0–44)
AST: 18 U/L (ref 15–41)
Albumin: 4.3 g/dL (ref 3.5–5.0)
Alkaline Phosphatase: 134 U/L — ABNORMAL HIGH (ref 38–126)
Anion gap: 15 (ref 5–15)
BUN: 10 mg/dL (ref 6–20)
CO2: 19 mmol/L — ABNORMAL LOW (ref 22–32)
Calcium: 9.5 mg/dL (ref 8.9–10.3)
Chloride: 90 mmol/L — ABNORMAL LOW (ref 98–111)
Creatinine, Ser: 1.1 mg/dL (ref 0.61–1.24)
GFR, Estimated: >60 mL/min (ref >60)
Glucose, Bld: 788 mg/dL (ref 70–99)
Potassium: 4.4 mmol/L (ref 3.5–5.1)
Sodium: 124 mmol/L — ABNORMAL LOW (ref 135–145)
Total Bilirubin: 0.5 mg/dL (ref 0.3–1.2)
Total Protein: 7.1 g/dL (ref 6.5–8.1)

## 2022-05-24 LAB — CBC
HCT: 42.2 % (ref 39.0–52.0)
Hemoglobin: 13.8 g/dL (ref 13.0–17.0)
MCH: 27.3 pg (ref 26.0–34.0)
MCHC: 32.7 g/dL (ref 30.0–36.0)
MCV: 83.6 fL (ref 80.0–100.0)
Platelets: 270 10*3/uL (ref 150–400)
RBC: 5.05 MIL/uL (ref 4.22–5.81)
RDW: 12.5 % (ref 11.5–15.5)
WBC: 6.7 10*3/uL (ref 4.0–10.5)
nRBC: 0 % (ref 0.0–0.2)

## 2022-05-24 LAB — LIPID PANEL
Cholesterol: 399 mg/dL — ABNORMAL HIGH (ref 0–200)
LDL Cholesterol: UNDETERMINED mg/dL (ref 0–99)
Triglycerides: 2232 mg/dL — ABNORMAL HIGH (ref <150)
VLDL: UNDETERMINED mg/dL (ref 0–40)

## 2022-05-24 LAB — GLUCOSE, CAPILLARY
Glucose-Capillary: 211 mg/dL — ABNORMAL HIGH (ref 70–99)
Glucose-Capillary: 231 mg/dL — ABNORMAL HIGH (ref 70–99)
Glucose-Capillary: 185 mg/dL — ABNORMAL HIGH (ref 70–99)
Glucose-Capillary: 168 mg/dL — ABNORMAL HIGH (ref 70–99)
Glucose-Capillary: 199 mg/dL — ABNORMAL HIGH (ref 70–99)

## 2022-05-24 LAB — CBG MONITORING, ED
Glucose-Capillary: >600 mg/dL (ref 70–99)
Glucose-Capillary: 472 mg/dL — ABNORMAL HIGH (ref 70–99)
Glucose-Capillary: 282 mg/dL — ABNORMAL HIGH (ref 70–99)
Glucose-Capillary: 213 mg/dL — ABNORMAL HIGH (ref 70–99)

## 2022-05-24 LAB — TROPONIN I (HIGH SENSITIVITY)
Troponin I (High Sensitivity): 13 ng/L (ref <18)
Troponin I (High Sensitivity): 14 ng/L (ref <18)

## 2022-05-24 LAB — BETA-HYDROXYBUTYRIC ACID: Beta-Hydroxybutyric Acid: 0.27 mmol/L (ref 0.05–0.27)

## 2022-05-24 LAB — LIPASE, BLOOD: Lipase: 58 U/L — ABNORMAL HIGH (ref 11–51)

## 2022-05-24 MED ORDER — IOHEXOL 300 MG/ML  SOLN
100.0000 mL | Freq: Once | INTRAMUSCULAR | Status: AC | PRN
  Administered 2022-05-24: 100 mL via INTRAVENOUS

## 2022-05-24 MED ORDER — NICOTINE 14 MG/24HR TD PT24
14.0000 mg | MEDICATED_PATCH | Freq: Every day | TRANSDERMAL | Status: DC
  Administered 2022-05-24 – 2022-05-27 (×4): 14 mg via TRANSDERMAL
  Filled 2022-05-24 (×4): qty 1

## 2022-05-24 MED ORDER — LACTATED RINGERS IV BOLUS: 20.0000 mL/kg | Freq: Once | INTRAVENOUS | Status: DC

## 2022-05-24 MED ORDER — DEXTROSE 50 % IV SOLN: 0.0000 mL | INTRAVENOUS | Status: DC | PRN

## 2022-05-24 MED ORDER — LACTATED RINGERS IV SOLN: INTRAVENOUS | Status: DC

## 2022-05-24 MED ORDER — INSULIN REGULAR(HUMAN) IN NACL 100-0.9 UT/100ML-% IV SOLN
INTRAVENOUS | Status: DC
  Administered 2022-05-24: 19 [IU]/h via INTRAVENOUS
  Filled 2022-05-24: qty 100

## 2022-05-24 MED ORDER — RIVAROXABAN 10 MG PO TABS
10.0000 mg | ORAL_TABLET | Freq: Every day | ORAL | Status: DC
  Administered 2022-05-24 – 2022-05-27 (×4): 10 mg via ORAL
  Filled 2022-05-24 (×4): qty 1

## 2022-05-24 MED ORDER — INSULIN REGULAR(HUMAN) IN NACL 100-0.9 UT/100ML-% IV SOLN
INTRAVENOUS | Status: DC
  Administered 2022-05-25: 3.8 [IU]/h via INTRAVENOUS
  Filled 2022-05-24: qty 100

## 2022-05-24 MED ORDER — DEXTROSE IN LACTATED RINGERS 5 % IV SOLN: INTRAVENOUS | Status: DC

## 2022-05-24 MED ORDER — ACETAMINOPHEN 325 MG PO TABS
650.0000 mg | ORAL_TABLET | Freq: Four times a day (QID) | ORAL | Status: DC | PRN
Start: 2022-05-24 — End: 2022-05-27
  Administered 2022-05-24 – 2022-05-25 (×2): 650 mg via ORAL
  Filled 2022-05-24 (×2): qty 2

## 2022-05-24 MED ORDER — POLYETHYLENE GLYCOL 3350 17 G PO PACK
17.0000 g | PACK | Freq: Every day | ORAL | Status: DC
  Administered 2022-05-24 – 2022-05-25 (×2): 17 g via ORAL
  Filled 2022-05-24 (×4): qty 1

## 2022-05-24 MED ORDER — SENNOSIDES-DOCUSATE SODIUM 8.6-50 MG PO TABS: 1.0000 | ORAL_TABLET | Freq: Every evening | ORAL | Status: DC | PRN

## 2022-05-24 MED ORDER — ALUM & MAG HYDROXIDE-SIMETH 200-200-20 MG/5ML PO SUSP
30.0000 mL | Freq: Four times a day (QID) | ORAL | Status: DC | PRN
  Administered 2022-05-24: 30 mL via ORAL
  Filled 2022-05-24: qty 30

## 2022-05-24 MED ORDER — LIP MEDEX EX OINT
TOPICAL_OINTMENT | CUTANEOUS | Status: DC | PRN
  Filled 2022-05-24: qty 7

## 2022-05-24 NOTE — H&P (Signed)
Date: 05/24/2022               Patient Name:  Logan Lucas MRN: [id number]  DOB: Aug 11, 1976 Age / Sex: 46 y.o., male   PCP: Patient, No Pcp Per         Medical Service: Internal Medicine Teaching Service         Attending Physician: Dr. Velna Ochs, MD    First Contact: Dr. Farrel Gordon Pager: [phone]  Second Contact: Dr. Sanjuan Dame Pager: [phone]       After Hours (After 5p/  First Contact Pager: [phone]  weekends / holidays): Second Contact Pager: [phone]   Chief Complaint: abdominal pain  History of Present Illness: Logan Lucas is a 46 y.o. male with PMH of asthma who presents today with abdominal pain over the last 2 weeks. He has also had polyuria, polydipsia, and nausea without vomiting. He notes unintentional weight loss based on how he appears thinned out in the mirror and has had new constipation. He has had bothersome cramping in his hands and legs in this time frame and notes numbness occasionally in his LLE. He has also had new blurred vision and brain fog.   ED Course: CMP with Na 124 (corrected 135), Cl 90, CO2 19, glucose 788, alk phos 134, anion gap 15; CBC WNL; lipase 58; troponin 13; UA with glucose >500.  Meds: None Current Meds  Medication Sig   acetaminophen (TYLENOL) 500 MG tablet Take 500 mg by mouth every 6 (six) hours as needed for moderate pain.   ibuprofen (ADVIL) 200 MG tablet Take 200 mg by mouth every 6 (six) hours as needed for mild pain.     Allergies: Allergies as of 05/24/2022   (No Known Allergies)   Past Medical History:  Diagnosis Date   Asthma    GI bleed    Hypertension     Family History: Mom had diabetes. No other family history of endocrine conditions.  Social History: Patient has little support at home but is independent in ADLs/IADLs. He works a lot. He smokes 1/2 PPD, occasionally drinks alcohol, and smokes marijuana occasionally.   Review of Systems: A complete ROS was negative except as per  HPI.   Physical Exam: Blood pressure 130/75, pulse 94, temperature 98.6 F (37 C), temperature source Oral, resp. rate 16, SpO2 97 %. Constitutional:Resting comfortably in bed in no acute distress. Cardio:Regular rate and rhythm. No murmurs, rubs, gallops. Pulm:Clear to auscultation bilaterally. Normal work of breathing on room air. Abdomen:Soft, non-distended. Tender to palpation. EXB:MWUXLKGM for extremity edema.  Skin:Warm and dry. Neuro:Alert and oriented x3. Sensation is decreased to LLE compared to R. Psych:Pleasant mood and affect.  EKG: personally reviewed my interpretation is NSR with T wave inversions in inferolateral leads.  CT abdomen/pelvis:  1. No CT evidence for acute intra-abdominal or pelvic abnormality. 2. Hepatic steatosis. 3. Mild diverticular disease of the left colon without acute inflammatory process  Assessment & Plan by Problem: Principal Problem:   DKA (diabetic ketoacidosis) ([place])  DKA with new diabetes mellitus diagnosis Patient presents with acute onset of symptoms including polyuria, polydipsia, abdominal pain, nausea, weight loss, vision changes, extremity numbness. Initial labs revealed severely elevated glucose of 788 with decreased bicarb of 19 and anion gap of 15. Fortunately K was normal. UA showed glucose >500 but no ketones. Glucose is improving with Endotool, most recently 282. -Endotool until gap closed (=<12) x2; BMP q4h -Check beta-hydroxybutyric acid -HbA1c -NPO  Hx HFpEF Hx  HTN EKG reveals inverted T waves, unchanged from prior. K is WNL and troponins negative. He did report chest discomfort over the preceding weeks as well. He denies cardiac history however chart review reveals history of HTN and HFpEF and abnormal stress tests and LHC as recently as 01/2021. LHC, stress test, and echo from 2022 showed that he has no angiographically significant CAD, moderately to severely elevated LV filling pressure; LV EF 55-60% with mild hypertrophy  and no regional wall motion abnormalities and no abnormalities of valves noted. He also had stress testing and echo done in 2019 which showed that he had T wave inversion in II, III, aVF, and V6; LV EF 55-65%; normal wall motion. Previous recommendations included continued GDMT for HFpEF and chest pain, with addition of Imdur 30 mg daily and a single dose of lasix IV at that time. 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  -Repeat echocardiogram -Lipid panel -Telemetry  Dispo: Admit patient to Observation with expected length of stay less than 2 midnights.  Signed: Farrel Gordon, DO 05/24/2022, 5:51 PM  Pager: [phone] After 5pm on weekdays and 1pm on weekends: On Call pager: [phone]

## 2022-05-24 NOTE — Hospital Course (Addendum)
Severe symptomatic hyperglycemia Patient presented to [facility] with acute onset of symptoms including polyuria, polydipsia, abdominal pain, nausea, weight loss, vision changes, extremity numbness. Initial labs revealed severely elevated glucose of 788 with decreased bicarb of 19 and anion gap of 15. Fortunately K was normal. Urinalysis showed significant glucosuria but no ketonuria. Beta-hydroxybutyric acid negative. Patient was started on Endotool in Emergency Department and was able to transition off later that evening. Patient has new diagnosis of type II diabetes mellitus and was started on metformin and long acting insulin. On discharge patient was also started on SGLT2i given history of HFpEF. He is to follow-up with [facility] for further titration of medications as needed   2.  Suspected early pancreatitis, severe hypertriglyceridemia Patient initially arrived to [facility] with epigastric abdominal pain and persistent nausea. Lipase mildly elevated at 58, no radiological evidence of pancreatitis. During initial work-up, patient was found to have significant triglycerides >2,000, possibly contributing to early pancreatitis. While he was receiving IV fluids and insulin for hyperglycemia, triglycerides decreased to 1,200 overnight. Patient was counseled on low-fat diet and started on high-intensity statin and fibrate.  3. HFpEF During hospitalization, Echocardiogram was completed which showed.Marland KitchenMarland Kitchen

## 2022-05-24 NOTE — Progress Notes (Signed)
Pt received from ED alert appropriate and ambulatory.  Vitals obtained, CHG performed, tele applied and CCMD notified.  CBG taken and insulin gtt adjusted according endotool.  Primary team paged and verbal order for water and ice chips.  Pt comfortable with call bell in reach.  Will report to night RN

## 2022-05-24 NOTE — ED Triage Notes (Signed)
Patient complains of abdominal and chest pain that started last week, then also lightheadedness and blurred vision that stared earlier this week. Patient states symptoms are exacerbated by bending over. Patient also reports polyuria, is alert, oriented, speaking in complete sentences, and is in no apparent distress at this time.

## 2022-05-24 NOTE — ED Provider Notes (Signed)
[facility] EMERGENCY DEPARTMENT Provider Note   CSN: [id number] Arrival date & time: 05/24/22  1218     History  Chief Complaint  Patient presents with   Abdominal Pain    Logan Lucas is a 46 y.o. male.   Abdominal Pain   Patient has history of hypertension and asthma.  Patient admits to intermittent cocaine use and alcohol use but none recently.  Patient states he started having symptoms last week of some pain in his abdomen.  He was also having discomfort up into his chest.  The pain in his abdomen is diffuse in nature.  He started having polyuria and polydipsia.  He feels like his vision is off.  Patient has not had any recent fevers.  No vomiting or diarrhea  Home Medications Prior to Admission medications   Medication Sig Start Date End Date Taking? Authorizing Provider  carvedilol (COREG) 3.125 MG tablet Take 1 tablet (3.125 mg total) by mouth 2 (two) times daily with a meal. 01/26/21   Wouk, Ailene Rud, MD  cyclobenzaprine (FLEXERIL) 5 MG tablet Take 1-2 tablets (5-10 mg total) by mouth 3 (three) times daily as needed for muscle spasms. Patient not taking: Reported on 01/23/2021 12/26/19   Duanne Guess, PA-C  dicyclomine (BENTYL) 10 MG capsule Take 1 capsule (10 mg total) by mouth 4 (four) times daily for 14 days. 12/02/20 12/16/20  Triplett, Dessa Phi, FNP  famotidine (PEPCID) 20 MG tablet Take 1 tablet (20 mg total) by mouth 2 (two) times daily for 14 days. 12/02/20 12/16/20  Triplett, Johnette Abraham B, FNP  isosorbide mononitrate (IMDUR) 30 MG 24 hr tablet Take 1 tablet (30 mg total) by mouth daily. 01/27/21   Wouk, Ailene Rud, MD  losartan (COZAAR) 25 MG tablet Take 1 tablet (25 mg total) by mouth daily. 01/26/21 01/26/22  Wouk, Ailene Rud, MD  nicotine (NICODERM CQ - DOSED IN MG/24 HOURS) 21 mg/24hr patch Place 1 patch (21 mg total) onto the skin daily. 01/27/21   Wouk, Ailene Rud, MD  omeprazole (PRILOSEC) 20 MG capsule Take 1 capsule (20 mg total) by mouth daily  for 14 days. 08/31/20 09/14/20  Duffy Bruce, MD  oxyCODONE (OXY IR/ROXICODONE) 5 MG immediate release tablet Take 1 tablet (5 mg total) by mouth every 4 (four) hours as needed for moderate pain. Patient not taking: Reported on 01/23/2021 10/17/19   Swayze, Ava, DO  prochlorperazine (COMPAZINE) 5 MG tablet Take 1 tablet (5 mg total) by mouth every 8 (eight) hours as needed (headache, nausea). Patient not taking: Reported on 01/23/2021 08/31/20   Duffy Bruce, MD  sucralfate (CARAFATE) 1 g tablet Take 1 tablet (1 g total) by mouth 4 (four) times daily for 5 days. 12/02/20 12/07/20  Triplett, Johnette Abraham B, FNP  SUMAtriptan (IMITREX) 50 MG tablet Take 1 tablet (50 mg total) by mouth once as needed for migraine. May repeat in 2 hours if headache persists or recurs. Patient not taking: Reported on 01/23/2021 05/07/20 05/08/21  Cuthriell, Charline Bills, PA-C      Allergies    Patient has no known allergies.    Review of Systems   Review of Systems  Gastrointestinal:  Positive for abdominal pain.    Physical Exam Updated Vital Signs BP (!) 129/92 (BP Location: Right Arm)   Pulse 100   Temp 98.6 F (37 C) (Oral)   Resp 20   SpO2 97%  Physical Exam Vitals and nursing note reviewed.  Constitutional:      General: He  is not in acute distress.    Appearance: He is well-developed.  HENT:     Head: Normocephalic and atraumatic.     Right Ear: External ear normal.     Left Ear: External ear normal.  Eyes:     General: No scleral icterus.       Right eye: No discharge.        Left eye: No discharge.     Conjunctiva/sclera: Conjunctivae normal.  Neck:     Trachea: No tracheal deviation.  Cardiovascular:     Rate and Rhythm: Normal rate and regular rhythm.  Pulmonary:     Effort: Pulmonary effort is normal. No respiratory distress.     Breath sounds: Normal breath sounds. No stridor. No wheezing or rales.  Abdominal:     General: Bowel sounds are normal. There is no distension.     Palpations:  Abdomen is soft.     Tenderness: There is generalized abdominal tenderness. There is no guarding or rebound.  Musculoskeletal:        General: No tenderness or deformity.     Cervical back: Neck supple.  Skin:    General: Skin is warm and dry.     Findings: No rash.  Neurological:     General: No focal deficit present.     Mental Status: He is alert.     Cranial Nerves: No cranial nerve deficit (no facial droop, extraocular movements intact, no slurred speech).     Sensory: No sensory deficit.     Motor: No abnormal muscle tone or seizure activity.     Coordination: Coordination normal.  Psychiatric:        Mood and Affect: Mood normal.     ED Results / Procedures / Treatments   Labs (all labs ordered are listed, but only abnormal results are displayed) Labs Reviewed  LIPASE, BLOOD - Abnormal; Notable for the following components:      Result Value   Lipase 58 (*)    All other components within normal limits  COMPREHENSIVE METABOLIC PANEL - Abnormal; Notable for the following components:   Sodium 124 (*)    Chloride 90 (*)    CO2 19 (*)    Glucose, Bld 788 (*)    Alkaline Phosphatase 134 (*)    All other components within normal limits  URINALYSIS, ROUTINE W REFLEX MICROSCOPIC - Abnormal; Notable for the following components:   Color, Urine STRAW (*)    Specific Gravity, Urine 1.031 (*)    Glucose, UA >=500 (*)    All other components within normal limits  CBC  CBG MONITORING, ED  TROPONIN I (HIGH SENSITIVITY)  TROPONIN I (HIGH SENSITIVITY)    EKG EKG Interpretation  Date/Time:  Thursday May 24 2022 12:44:42 EDT Ventricular Rate:  97 PR Interval:  146 QRS Duration: 82 QT Interval:  346 QTC Calculation: 439 R Axis:   67 Text Interpretation: Normal sinus rhythm T wave abnormality, consider inferolateral ischemia Abnormal ECG When compared with ECG of 22-Jan-2021 23:55, No significant change since last tracing Confirmed by Dorie Rank [phone]) on 05/24/2022 2:20:11  PM  Radiology No results found.  Procedures .Critical Care  Performed by: Dorie Rank, MD Authorized by: Dorie Rank, MD   Critical care provider statement:    Critical care time (minutes):  30   Critical care was time spent personally by me on the following activities:  Development of treatment plan with patient or surrogate, discussions with consultants, evaluation of patient's response to treatment, examination  of patient, ordering and review of laboratory studies, ordering and review of radiographic studies, ordering and performing treatments and interventions, pulse oximetry, re-evaluation of patient's condition and review of old charts     Medications Ordered in ED Medications  insulin regular, human (MYXREDLIN) 100 units/ 100 mL infusion (19 Units/hr Intravenous New Bag/Given 05/24/22 1519)  lactated ringers infusion ( Intravenous New Bag/Given 05/24/22 1522)  dextrose 5 % in lactated ringers infusion (has no administration in time range)  dextrose 50 % solution 0-50 mL (has no administration in time range)  iohexol (OMNIPAQUE) 300 MG/ML solution 100 mL (100 mLs Intravenous Contrast Given 05/24/22 1557)    ED Course/ Medical Decision Making/ A&P Clinical Course as of 05/24/22 1558  Thu May 24, 2022  1557 Urinalysis, Routine w reflex microscopic(!) without signs of infection [JK]  1557 Troponin I (High Sensitivity) Initial troponin normal [JK]    Clinical Course User Index [JK] Dorie Rank, MD                           Medical Decision Making Differential during the this includes but not limited to pancreatitis hepatitis cholecystitis hyperglycemia  Problems Addressed: Abdominal pain, unspecified abdominal location: acute illness or injury Hyperglycemia: acute illness or injury that poses a threat to life or bodily functions  Amount and/or Complexity of Data Reviewed Labs: ordered.    Details: Labs notable for severe hyperglycemia.  Hyponatremia related to the  hyperglycemia.  No signs of anion gap metabolic acidosis Radiology: ordered.  Risk Prescription drug management. Decision regarding hospitalization.   Patient's work-up was notable for severe hyperglycemia.  Blood sugar in the 700s.  Patient does not have a known history of diabetes.  He was started on IV fluids and IV insulin.  Patient will require admission to the hospital for further treatment.  No signs of DKA or hyperosmolar syndrome at this time.  Patient with abdominal pain.  May be related to his hyperglycemia but CT scan has been ordered.  Images are currently pending.  Care turned over to Dr. Melina Copa pending CT scan and disposition but anticipate pt will need admission to the hospital for further treatment and evaluation of his new onset diabetes and extreme hyperglycemia        Final Clinical Impression(s) / ED Diagnoses Final diagnoses:  Hyperglycemia  Abdominal pain, unspecified abdominal location    Rx / DC Orders ED Discharge Orders     None         Dorie Rank, MD 05/24/22 1558

## 2022-05-24 NOTE — ED Notes (Signed)
Patient transported to CT 

## 2022-05-24 NOTE — ED Provider Notes (Signed)
Signout from Dr. Tomi Bamberger.  46 year old male history of hypertension asthma here with abdominal pain polyuria polydipsia blurry vision.  Found to have markedly elevated blood sugars.  He is currently on an insulin drip and pending a CT abdomen and pelvis.  Plan is for admission to medical service if no significant findings on CT. Physical Exam  BP (!) 129/92 (BP Location: Right Arm)   Pulse 100   Temp 98.6 F (37 C) (Oral)   Resp 20   SpO2 97%   Physical Exam  Procedures  Procedures Very well-appearing awake and alert.  Benign abdominal exam.  Reviewed reviewed of work-up with him.  He is agreeable to plan for admission for further management of his hyperglycemia.  Unassigned admission order placed. ED Course / MDM   Clinical Course as of 05/24/22 1604  Thu May 24, 2022  1557 Urinalysis, Routine w reflex microscopic(!) without signs of infection [JK]  1557 Troponin I (High Sensitivity) Initial troponin normal [JK]    Clinical Course User Index [JK] Dorie Rank, MD   Medical Decision Making Amount and/or Complexity of Data Reviewed Labs: ordered. Decision-making details documented in ED Course. Radiology: ordered.  Risk Prescription drug management. Decision regarding hospitalization.  Discussed with internal medicine teaching service who will evaluate the patient for admission.        Hayden Rasmussen, MD 05/25/22 [phone]

## 2022-05-25 ENCOUNTER — Other Ambulatory Visit (HOSPITAL_COMMUNITY): Payer: Self-pay

## 2022-05-25 ENCOUNTER — Observation Stay (HOSPITAL_COMMUNITY): Payer: Self-pay

## 2022-05-25 DIAGNOSIS — R079 Chest pain, unspecified: Secondary | ICD-10-CM

## 2022-05-25 DIAGNOSIS — E781 Pure hyperglyceridemia: Secondary | ICD-10-CM

## 2022-05-25 LAB — GLUCOSE, CAPILLARY
Glucose-Capillary: 167 mg/dL — ABNORMAL HIGH (ref 70–99)
Glucose-Capillary: 186 mg/dL — ABNORMAL HIGH (ref 70–99)
Glucose-Capillary: 195 mg/dL — ABNORMAL HIGH (ref 70–99)
Glucose-Capillary: 208 mg/dL — ABNORMAL HIGH (ref 70–99)
Glucose-Capillary: 228 mg/dL — ABNORMAL HIGH (ref 70–99)
Glucose-Capillary: 251 mg/dL — ABNORMAL HIGH (ref 70–99)
Glucose-Capillary: 265 mg/dL — ABNORMAL HIGH (ref 70–99)
Glucose-Capillary: 288 mg/dL — ABNORMAL HIGH (ref 70–99)
Glucose-Capillary: 305 mg/dL — ABNORMAL HIGH (ref 70–99)
Glucose-Capillary: 315 mg/dL — ABNORMAL HIGH (ref 70–99)

## 2022-05-25 LAB — BASIC METABOLIC PANEL
Anion gap: 9 (ref 5–15)
Anion gap: 13 (ref 5–15)
Anion gap: 11 (ref 5–15)
BUN: 8 mg/dL (ref 6–20)
BUN: 9 mg/dL (ref 6–20)
BUN: 8 mg/dL (ref 6–20)
CO2: 23 mmol/L (ref 22–32)
CO2: 24 mmol/L (ref 22–32)
CO2: 22 mmol/L (ref 22–32)
Calcium: 9.3 mg/dL (ref 8.9–10.3)
Calcium: 9.5 mg/dL (ref 8.9–10.3)
Calcium: 9.1 mg/dL (ref 8.9–10.3)
Chloride: 103 mmol/L (ref 98–111)
Chloride: 98 mmol/L (ref 98–111)
Chloride: 99 mmol/L (ref 98–111)
Creatinine, Ser: 0.7 mg/dL (ref 0.61–1.24)
Creatinine, Ser: 0.89 mg/dL (ref 0.61–1.24)
Creatinine, Ser: 0.79 mg/dL (ref 0.61–1.24)
GFR, Estimated: >60 mL/min (ref >60)
GFR, Estimated: >60 mL/min (ref >60)
GFR, Estimated: >60 mL/min (ref >60)
Glucose, Bld: 159 mg/dL — ABNORMAL HIGH (ref 70–99)
Glucose, Bld: 224 mg/dL — ABNORMAL HIGH (ref 70–99)
Glucose, Bld: 290 mg/dL — ABNORMAL HIGH (ref 70–99)
Potassium: 3.3 mmol/L — ABNORMAL LOW (ref 3.5–5.1)
Potassium: 3.6 mmol/L (ref 3.5–5.1)
Potassium: 3.7 mmol/L (ref 3.5–5.1)
Sodium: 135 mmol/L (ref 135–145)
Sodium: 135 mmol/L (ref 135–145)
Sodium: 132 mmol/L — ABNORMAL LOW (ref 135–145)

## 2022-05-25 LAB — ECHOCARDIOGRAM COMPLETE
Area-P 1/2: 3.03 cm2
Height: 69 in
S' Lateral: 3.5 cm
Single Plane A2C EF: 38.7 %
Weight: 3977.6 oz

## 2022-05-25 LAB — HEPATIC FUNCTION PANEL
ALT: 28 U/L (ref 0–44)
AST: 27 U/L (ref 15–41)
Albumin: 3.4 g/dL — ABNORMAL LOW (ref 3.5–5.0)
Alkaline Phosphatase: 109 U/L (ref 38–126)
Bilirubin, Direct: 0.1 mg/dL (ref 0.0–0.2)
Indirect Bilirubin: 0.6 mg/dL (ref 0.3–0.9)
Total Bilirubin: 0.7 mg/dL (ref 0.3–1.2)
Total Protein: 6.1 g/dL — ABNORMAL LOW (ref 6.5–8.1)

## 2022-05-25 LAB — CBC
HCT: 39.3 % (ref 39.0–52.0)
Hemoglobin: 13.6 g/dL (ref 13.0–17.0)
MCH: 28.5 pg (ref 26.0–34.0)
MCHC: 34.6 g/dL (ref 30.0–36.0)
MCV: 82.2 fL (ref 80.0–100.0)
Platelets: 233 10*3/uL (ref 150–400)
RBC: 4.78 MIL/uL (ref 4.22–5.81)
RDW: 12.3 % (ref 11.5–15.5)
WBC: 7 10*3/uL (ref 4.0–10.5)
nRBC: 0 % (ref 0.0–0.2)

## 2022-05-25 LAB — TRIGLYCERIDES: Triglycerides: 1188 mg/dL — ABNORMAL HIGH (ref <150)

## 2022-05-25 LAB — HIV ANTIBODY (ROUTINE TESTING W REFLEX): HIV Screen 4th Generation wRfx: NONREACTIVE

## 2022-05-25 MED ORDER — INSULIN GLARGINE-YFGN 100 UNIT/ML ~~LOC~~ SOLN
10.0000 [IU] | Freq: Once | SUBCUTANEOUS | Status: AC
  Administered 2022-05-25: 10 [IU] via SUBCUTANEOUS
  Filled 2022-05-25: qty 0.1

## 2022-05-25 MED ORDER — INSULIN ASPART 100 UNIT/ML IJ SOLN
0.0000 [IU] | INTRAMUSCULAR | Status: DC
  Administered 2022-05-25: 5 [IU] via SUBCUTANEOUS
  Administered 2022-05-25 (×2): 8 [IU] via SUBCUTANEOUS
  Administered 2022-05-25 (×2): 11 [IU] via SUBCUTANEOUS
  Administered 2022-05-26 (×3): 8 [IU] via SUBCUTANEOUS
  Administered 2022-05-26: 15 [IU] via SUBCUTANEOUS

## 2022-05-25 MED ORDER — FENOFIBRATE 54 MG PO TABS
54.0000 mg | ORAL_TABLET | Freq: Every day | ORAL | Status: DC
  Administered 2022-05-25 – 2022-05-27 (×3): 54 mg via ORAL
  Filled 2022-05-25 (×4): qty 1

## 2022-05-25 MED ORDER — INSULIN STARTER KIT- PEN NEEDLES (ENGLISH)
1.0000 | Freq: Once | Status: DC
Start: 2022-05-25 — End: 2022-05-27
  Filled 2022-05-25: qty 1

## 2022-05-25 MED ORDER — ACCU-CHEK SOFTCLIX LANCETS MISC
0 refills | Status: DC
  Filled 2022-05-25: qty 100, 25d supply, fill #0

## 2022-05-25 MED ORDER — EMPAGLIFLOZIN 10 MG PO TABS
10.0000 mg | ORAL_TABLET | Freq: Every day | ORAL | Status: DC
  Filled 2022-05-25: qty 1

## 2022-05-25 MED ORDER — FENOFIBRATE 54 MG PO TABS
54.0000 mg | ORAL_TABLET | Freq: Every day | ORAL | 2 refills | Status: DC
  Filled 2022-05-25: qty 30, 30d supply, fill #0
  Filled 2022-06-21 (×2): qty 30, 30d supply, fill #1

## 2022-05-25 MED ORDER — ATORVASTATIN CALCIUM 80 MG PO TABS
80.0000 mg | ORAL_TABLET | Freq: Every day | ORAL | 2 refills | Status: DC
  Filled 2022-05-25: qty 30, 30d supply, fill #0
  Filled 2022-06-21 (×2): qty 30, 30d supply, fill #1

## 2022-05-25 MED ORDER — HYDROXYZINE HCL 10 MG PO TABS
10.0000 mg | ORAL_TABLET | Freq: Three times a day (TID) | ORAL | Status: DC | PRN
  Administered 2022-05-25: 10 mg via ORAL
  Filled 2022-05-25 (×3): qty 1

## 2022-05-25 MED ORDER — ATORVASTATIN CALCIUM 80 MG PO TABS
80.0000 mg | ORAL_TABLET | Freq: Every day | ORAL | Status: DC
  Administered 2022-05-25 – 2022-05-27 (×3): 80 mg via ORAL
  Filled 2022-05-25 (×3): qty 1

## 2022-05-25 MED ORDER — GLUCOSE BLOOD VI STRP
ORAL_STRIP | 0 refills | Status: DC
  Filled 2022-05-25: qty 100, 25d supply, fill #0

## 2022-05-25 MED ORDER — BLOOD GLUCOSE MONITOR KIT
PACK | 0 refills | Status: DC
Start: 2022-05-25 — End: 2023-06-27
  Filled 2022-05-25: qty 1, 30d supply, fill #0

## 2022-05-25 MED ORDER — INSULIN GLARGINE-YFGN 100 UNIT/ML ~~LOC~~ SOPN
13.0000 [IU] | PEN_INJECTOR | Freq: Every day | SUBCUTANEOUS | 11 refills | Status: DC
  Filled 2022-05-25: qty 3, 23d supply, fill #0

## 2022-05-25 MED ORDER — METFORMIN HCL ER 500 MG PO TB24
500.0000 mg | ORAL_TABLET | Freq: Every day | ORAL | 2 refills | Status: DC
  Filled 2022-05-25: qty 30, 30d supply, fill #0

## 2022-05-25 MED ORDER — INSULIN GLARGINE-YFGN 100 UNIT/ML ~~LOC~~ SOLN
13.0000 [IU] | Freq: Every day | SUBCUTANEOUS | Status: DC
Start: 2022-05-25 — End: 2022-05-26
  Administered 2022-05-25 – 2022-05-26 (×2): 13 [IU] via SUBCUTANEOUS
  Filled 2022-05-25 (×2): qty 0.13

## 2022-05-25 MED ORDER — EMPAGLIFLOZIN 10 MG PO TABS
10.0000 mg | ORAL_TABLET | Freq: Every day | ORAL | 2 refills | Status: DC
  Filled 2022-05-25: qty 30, 30d supply, fill #0
  Filled 2022-06-21 (×2): qty 30, 30d supply, fill #1

## 2022-05-25 MED ORDER — METFORMIN HCL ER 500 MG PO TB24
500.0000 mg | ORAL_TABLET | Freq: Every day | ORAL | Status: DC
Start: 2022-05-26 — End: 2022-05-27
  Administered 2022-05-26 – 2022-05-27 (×2): 500 mg via ORAL
  Filled 2022-05-25 (×2): qty 1

## 2022-05-25 MED ORDER — INSULIN PEN NEEDLE 31G X 8 MM MISC
0 refills | Status: DC
  Filled 2022-05-25: qty 100, 25d supply, fill #0

## 2022-05-25 MED ORDER — INSULIN ASPART 100 UNIT/ML FLEXPEN
0.0000 [IU] | PEN_INJECTOR | SUBCUTANEOUS | 11 refills | Status: DC
  Filled 2022-05-25: qty 15, 20d supply, fill #0

## 2022-05-25 MED ORDER — LIVING WELL WITH DIABETES BOOK
Freq: Once | Status: DC
  Filled 2022-05-25: qty 1

## 2022-05-25 NOTE — Progress Notes (Addendum)
HD#0 SUBJECTIVE:  Patient Summary: Logan Lucas is a 46 y.o. with a pertinent PMH of asthma, HFpEF, and HTN, who presented with abdominal pain and admitted for severe symptomatic hyperglycemia.   Overnight Events: Patient successfully transitioned off Endotool.   Interim History: Patient feels better today but still is experiencing occasional muscle cramps, numbness in the LLE, and abdominal discomfort. He has had mild nausea with PO intake as well.  OBJECTIVE:  Vital Signs: Vitals:   05/25/22 0428 05/25/22 0614 05/25/22 0736 05/25/22 0844  BP: 118/78  127/81 131/84  Pulse: 74  83 87  Resp: '17  14 16  '$ Temp: 98.2 F (36.8 C)  97.8 F (36.6 C) 98 F (36.7 C)  TempSrc: Oral  Oral Oral  SpO2: 97%  99% 100%  Weight:  112.8 kg    Height:  '5\' 9"'$  (1.753 m)     Supplemental O2: Room Air SpO2: 100 %  Filed Weights   05/25/22 0614  Weight: 112.8 kg     Intake/Output Summary (Last 24 hours) at 05/25/2022 1135 Last data filed at 05/25/2022 0700 Gross per 24 hour  Intake 1764.81 ml  Output 380 ml  Net 1384.81 ml   Net IO Since Admission: 1,384.81 mL [05/25/22 1135]  Physical Exam: Constitutional:Resting comfortably in bed in no acute distress. Cardio:Regular rate and rhythm. No murmurs, rubs, gallops. Pulm:Clear to auscultation bilaterally. Normal work of breathing on room air. Abdomen:Soft, non-distended. Tender to palpation particularly over epigastrium PYP:[id number] for extremity edema.  Skin:Warm and dry. Neuro:Alert and oriented x3, no focal deficit noted. Psych:Pleasant mood and affect.  Patient Lines/Drains/Airways Status     Active Line/Drains/Airways     Name Placement date Placement time Site Days   Peripheral IV 05/24/22 20 G Right Antecubital 05/24/22  1456  Antecubital  1   Peripheral IV 05/24/22 20 G Anterior;Distal;Left;Upper Antecubital 05/24/22  1531  Antecubital  1             ASSESSMENT/PLAN:  Assessment: Principal Problem:   DKA  (diabetic ketoacidosis) ([place])   Plan: Severe symptomatic hyperglycemia Newly Diagnosed type 2 diabetes mellitus Patient was successfully transitioned off of Endotool overnight and has had continued closing of anion gap. Beta-hydroxybutyric acid was WNL and he had no evidence of ketonuria; with this new information it is less likely that he was in DKA. Blood sugars have fluctuated and will need tighter control. Given his new diagnosis of type 2 diabetes mellitus, history of HFpEF, and obesity, I believe he would be a great candidate for broader therapy with SGLT-2 inhibitor and GLP-1 agonist in addition to insulin. I hope that at some point once his disease is better controlled he can be transitioned off of insulin. HbA1c is still pending. Since transitioning off of Endotool, he has received 10 units Semglee with 16 units of SSI in addition since admission. -Will increase Semglee to 13 units daily. Titrate up daily as needed based on insulin requirements. -Start Jardiance 10 mg daily -Start metformin 500 mg daily -SSI -F/u HbA1c -Consult to diabetes educator  Acute pancreatitis, suspected Hypertriglyceridemia Patient reports continued abdominal pain in his epigastrium and bilateral lower quadrants. He has had persistent nausea as well with PO intake. Lipid panel obtained revealed total cholesterol 399 and remarkably elevated triglyceride level at 2,232; this was collected after patient had received several hours of insulin infusion and likely was higher prior to this therapy. 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 Lipase on admission minimally elevated at 58, however given this value combined with nausea/vomiting, abdominal pain, and significantly elevated triglyceride level, I worry that he may have acute  pancreatitis secondary to hypertriglyceridemia. Fortunately CT abdomen did not show signs of pancreatic inflammation. -Start atorvastatin 80 mg daily -Start fenofibrate 54 mg daily -Repeat lipid panel now and in 6-8 weeks -PO intake as tolerated.   Hx HFpEF Hx HTN Echocardiogram pending. Previously noted inverted T waves have improved on telemetry at this time. He denies chest pain and is not hypervolemic on exam.  -F/u echocardiogram -Start statin, fibrate therapy as above -Start SGLT-2, GLP-1 therapy as above -Telemetry  Best Practice: Diet: Diabetic diet IVF: None VTE: rivaroxaban (XARELTO) tablet 10 mg Start: 05/24/22 2000 Code: Full AB: None DISPO: Anticipated discharge in 1-2 days to Home pending Medical stability and New diabetic.  Signature: Farrel Gordon, D.O.  Internal Medicine Resident, PGY-1 [facility]  Pager: [phone] 11:35 AM, 05/25/2022   Please contact the on call pager after 5 pm and on weekends at [phone].

## 2022-05-25 NOTE — Progress Notes (Signed)
Inpatient Diabetes Program Recommendations  AACE/ADA: New Consensus Statement on Inpatient Glycemic Control (2015)  Target Ranges:  Prepandial:   less than 140 mg/dL      Peak postprandial:   less than 180 mg/dL (1-2 hours)      Critically ill patients:  140 - 180 mg/dL   Lab Results  Component Value Date   GLUCAP 305 (H) 05/25/2022   HGBA1C 7.1 (H) 01/24/2021    Review of Glycemic Control  Latest Reference Range & Units 05/25/22 02:07 05/25/22 03:05 05/25/22 04:04 05/25/22 05:10 05/25/22 07:51 05/25/22 12:10  Glucose-Capillary 70 - 99 mg/dL 167 (H) 186 (H) 251 (H) 228 (H) 315 (H) 305 (H)   Diabetes history: DM - New Diagnosis (although A1C was 7.1% in March of 2022)  Current orders for Inpatient glycemic control:  Novolog moderate q 4 hours Semglee 13 units daily Metformin XR  500 mg daily Inpatient Diabetes Program Recommendations:         Spoke with pt about new diagnosis.  Discussed A1C with him from 2022 (told him A1C is currently pending) and explained what an A1C is, basic pathophysiology of DM Type 2, basic home care, importance of checking CBGs and maintaining good CBG control to prevent long-term and short-term complications.  Reviewed signs and symptoms of hyperglycemia and hypoglycemia.  RNs to provide ongoing basic DM education at bedside with this patient.  Have ordered educational booklet, insulin starter kit, and DM videos. Discussed current need for insulin.       Gave patient "reli-on" glucose meter and showed him how to use.  Patient was able to demonstrate use of meter and lancet device and checked blood sugar while I was in the room.        Educated patient  on insulin pen use at home. Reviewed all steps if insulin pen including attachment of needle, 2-unit air shot, dialing up dose, giving injection, removing needle, disposal of sharps, storage of unused insulin, disposal of insulin etc. Patient able to provide successful return demonstration.  Also reviewed  troubleshooting with insulin pen.  MD to give patient Rxs for insulin pens and insulin pen needles.       Patient currently works at [employer] and is waiting to get insurance.  He walks to work daily (5 days a week).  We discussed storage of insulin and also hypoglycemia signs, symptoms and treatment.  He plans to tell his co-workers for safety at work.  Patient was very engaged in education.  Reviewed plate method and the importance of eliminating sugar from beverages.  Plan is for him to f/u at [facility] medicine [facility].    Thanks,  Adah Perl, RN, BC-ADM Inpatient Diabetes Coordinator Pager [phone]  (8a-5p)

## 2022-05-25 NOTE — Progress Notes (Signed)
  Echocardiogram 2D Echocardiogram has been performed.  Darlina Sicilian M 05/25/2022, 3:15 PM

## 2022-05-26 LAB — LDL CHOLESTEROL, DIRECT: Direct LDL: 73.4 mg/dL (ref 0–99)

## 2022-05-26 LAB — LIPID PANEL
Cholesterol: 298 mg/dL — ABNORMAL HIGH (ref 0–200)
HDL: 27 mg/dL — ABNORMAL LOW (ref >40)
LDL Cholesterol: UNDETERMINED mg/dL (ref 0–99)
Triglycerides: 1400 mg/dL — ABNORMAL HIGH (ref <150)
VLDL: UNDETERMINED mg/dL (ref 0–40)

## 2022-05-26 LAB — GLUCOSE, CAPILLARY
Glucose-Capillary: 246 mg/dL — ABNORMAL HIGH (ref 70–99)
Glucose-Capillary: 288 mg/dL — ABNORMAL HIGH (ref 70–99)
Glucose-Capillary: 292 mg/dL — ABNORMAL HIGH (ref 70–99)
Glucose-Capillary: 298 mg/dL — ABNORMAL HIGH (ref 70–99)
Glucose-Capillary: 299 mg/dL — ABNORMAL HIGH (ref 70–99)
Glucose-Capillary: 354 mg/dL — ABNORMAL HIGH (ref 70–99)

## 2022-05-26 MED ORDER — MELATONIN 3 MG PO TABS
3.0000 mg | ORAL_TABLET | Freq: Every evening | ORAL | Status: DC | PRN
Start: 2022-05-26 — End: 2022-05-27
  Administered 2022-05-26: 3 mg via ORAL
  Filled 2022-05-26: qty 1

## 2022-05-26 MED ORDER — INSULIN GLARGINE-YFGN 100 UNIT/ML ~~LOC~~ SOLN
20.0000 [IU] | Freq: Every day | SUBCUTANEOUS | Status: DC
  Administered 2022-05-27: 20 [IU] via SUBCUTANEOUS
  Filled 2022-05-26 (×2): qty 0.2

## 2022-05-26 MED ORDER — INSULIN ASPART 100 UNIT/ML IJ SOLN
0.0000 [IU] | Freq: Every day | INTRAMUSCULAR | Status: DC
  Administered 2022-05-26: 3 [IU] via SUBCUTANEOUS

## 2022-05-26 MED ORDER — INSULIN ASPART 100 UNIT/ML IJ SOLN
0.0000 [IU] | Freq: Three times a day (TID) | INTRAMUSCULAR | Status: DC
  Administered 2022-05-26: 5 [IU] via SUBCUTANEOUS
  Administered 2022-05-27 (×2): 8 [IU] via SUBCUTANEOUS

## 2022-05-26 MED ORDER — INSULIN ASPART 100 UNIT/ML IJ SOLN: 0.0000 [IU] | Freq: Three times a day (TID) | INTRAMUSCULAR | Status: DC

## 2022-05-26 NOTE — Progress Notes (Addendum)
Subjective:   Hospital day:2  Overnight event: Received diabetes counseling by diabetic coordinator yesterday.  No acute events overnight  Interim History: Patient was evaluated at the bedside laying comfortably in bed.  States his symptoms he came in with have improved but he is still have occasional abdominal discomfort and dizziness described as lightheadedness and room spinning. States he was overwhelmed with all of the new information and education he received about being a diabetic. He is concerned about going home today due to other new medications to be on his blood sugar being very high.  Objective:  Vital signs in last 24 hours: Vitals:   05/25/22 2006 05/26/22 0010 05/26/22 0359 05/26/22 0732  BP: 108/76 (!) 93/57 115/88 121/73  Pulse: 89 85 73 83  Resp: '20 17 17 17  '$ Temp: 98 F (36.7 C) 98.2 F (36.8 C) 97.9 F (36.6 C) 98.1 F (36.7 C)  TempSrc: Oral Oral Oral Oral  SpO2: 97% 98% 97% 97%  Weight:      Height:        Filed Weights   05/25/22 0614  Weight: 112.8 kg     Intake/Output Summary (Last 24 hours) at 05/26/2022 0737 Last data filed at 05/26/2022 4401 Gross per 24 hour  Intake 720 ml  Output 575 ml  Net 145 ml   Net IO Since Admission: 1,529.81 mL [05/26/22 0737]  Recent Labs    05/25/22 2005 05/26/22 0012 05/26/22 0358  GLUCAP 288* 354* 299*     Pertinent Labs:    Latest Ref Rng & Units 05/25/2022    8:16 AM 05/24/2022   12:55 PM 01/25/2021    4:42 AM  CBC  WBC 4.0 - 10.5 K/uL 7.0  6.7  7.0   Hemoglobin 13.0 - 17.0 g/dL 13.6  13.8  12.8   Hematocrit 39.0 - 52.0 % 39.3  42.2  39.0   Platelets 150 - 400 K/uL 233  270  264        Latest Ref Rng & Units 05/25/2022    8:16 AM 05/25/2022    4:38 AM 05/25/2022    1:14 AM  CMP  Glucose 70 - 99 mg/dL 290  224  159   BUN 6 - 20 mg/dL '8  9  8   '$ Creatinine 0.61 - 1.24 mg/dL 0.79  0.89  0.70   Sodium 135 - 145 mmol/L 132  135  135   Potassium 3.5 - 5.1 mmol/L 3.7  3.6  3.3   Chloride 98 - 111  mmol/L 99  98  103   CO2 22 - 32 mmol/L '22  24  23   '$ Calcium 8.9 - 10.3 mg/dL 9.1  9.5  9.3   Total Protein 6.5 - 8.1 g/dL 6.1     Total Bilirubin 0.3 - 1.2 mg/dL 0.7     Alkaline Phos 38 - 126 U/L 109     AST 15 - 41 U/L 27     ALT 0 - 44 U/L 28       Imaging: ECHOCARDIOGRAM COMPLETE  Result Date: 05/25/2022    ECHOCARDIOGRAM REPORT   Patient Name:   QUINTRELL BAZE Date of Exam: 05/25/2022 Medical Rec #:  [id number]          Height:       69.0 in Accession #:    [id number]         Weight:       248.6 lb Date of Birth:  Jul 08, 1976  BSA:          2.266 m Patient Age:    21 years           BP:           132/89 mmHg Patient Gender: M                  HR:           81 bpm. Exam Location:  Inpatient Procedure: 2D Echo, Cardiac Doppler and Color Doppler Indications:    Chest Pain R07.9  History:        Patient has prior history of Echocardiogram examinations, most                 recent 01/23/2021. CHF; Risk Factors:Hypertension and Diabetes.  Sonographer:    Darlina Sicilian RDCS Referring Phys: [id number] [place]  1. Abnormal septal motion . Left ventricular ejection fraction, by estimation, is 55%. The left ventricle has normal function. The left ventricle has no regional wall motion abnormalities. Left ventricular diastolic parameters are consistent with Grade I diastolic dysfunction (impaired relaxation).  2. Right ventricular systolic function is normal. The right ventricular size is normal.  3. The mitral valve is normal in structure. No evidence of mitral valve regurgitation. No evidence of mitral stenosis.  4. The aortic valve is tricuspid. Aortic valve regurgitation is not visualized. No aortic stenosis is present.  5. The inferior vena cava is normal in size with greater than 50% respiratory variability, suggesting right atrial pressure of 3 mmHg. FINDINGS  Left Ventricle: Abnormal septal motion. Left ventricular ejection fraction, by estimation, is 55%. The left ventricle  has normal function. The left ventricle has no regional wall motion abnormalities. The left ventricular internal cavity size was normal  in size. There is no left ventricular hypertrophy. Left ventricular diastolic parameters are consistent with Grade I diastolic dysfunction (impaired relaxation). Right Ventricle: The right ventricular size is normal. No increase in right ventricular wall thickness. Right ventricular systolic function is normal. Left Atrium: Left atrial size was normal in size. Right Atrium: Right atrial size was normal in size. Pericardium: There is no evidence of pericardial effusion. Mitral Valve: The mitral valve is normal in structure. No evidence of mitral valve regurgitation. No evidence of mitral valve stenosis. Tricuspid Valve: The tricuspid valve is normal in structure. Tricuspid valve regurgitation is not demonstrated. No evidence of tricuspid stenosis. Aortic Valve: The aortic valve is tricuspid. Aortic valve regurgitation is not visualized. No aortic stenosis is present. Pulmonic Valve: The pulmonic valve was normal in structure. Pulmonic valve regurgitation is not visualized. No evidence of pulmonic stenosis. Aorta: The aortic root is normal in size and structure. Venous: The inferior vena cava is normal in size with greater than 50% respiratory variability, suggesting right atrial pressure of 3 mmHg. IAS/Shunts: No atrial level shunt detected by color flow Doppler.  LEFT VENTRICLE PLAX 2D LVIDd:         5.40 cm      Diastology LVIDs:         3.50 cm      LV e' medial:    6.46 cm/s LV PW:         1.00 cm      LV E/e' medial:  8.0 LV IVS:        1.10 cm      LV e' lateral:   9.57 cm/s LVOT diam:     2.10 cm      LV E/e'  lateral: 5.4 LV SV:         55 LV SV Index:   24 LVOT Area:     3.46 cm  LV Volumes (MOD) LV vol d, MOD A2C: 101.0 ml LV vol d, MOD A4C: 106.0 ml LV vol s, MOD A2C: 61.9 ml LV SV MOD A2C:     39.1 ml LV SV MOD A4C:     106.0 ml RIGHT VENTRICLE RV S prime:     15.80 cm/s  TAPSE (M-mode): 2.0 cm LEFT ATRIUM             Index        RIGHT ATRIUM          Index LA diam:        3.10 cm 1.37 cm/m   RA Area:     6.86 cm LA Vol (A2C):   31.2 ml 13.77 ml/m  RA Volume:   11.30 ml 4.99 ml/m LA Vol (A4C):   39.1 ml 17.25 ml/m LA Biplane Vol: 35.7 ml 15.75 ml/m  AORTIC VALVE LVOT Vmax:   87.60 cm/s LVOT Vmean:  61.000 cm/s LVOT VTI:    0.159 m  AORTA Ao Root diam: 3.80 cm Ao Asc diam:  3.30 cm MITRAL VALVE MV Area (PHT): 3.03 cm    SHUNTS MV Decel Time: 250 msec    Systemic VTI:  0.16 m MV E velocity: 51.70 cm/s  Systemic Diam: 2.10 cm MV A velocity: 67.70 cm/s MV E/A ratio:  0.76 Jenkins Rouge MD Electronically signed by Jenkins Rouge MD Signature Date/Time: 05/25/2022/3:41:31 PM    Final     Physical Exam  General: Pleasant, well-appearing middle-age man laying in bed. No acute distress. CV: RRR. No m/r/g. No LE edema Pulmonary: Lungs CTAB. Normal effort. No wheezing or rales. Abdominal: Obese. Soft, nontender, nondistended. Normal bowel sounds. Extremities: 2+ distal pulses. Normal ROM. Skin: Warm and dry. No obvious rash or lesions. Neuro: A&Ox3. Moves all extremities. Normal sensation to gross touch.  Psych: Normal mood and affect  Assessment/Plan: Logan Lucas is a 46 y.o. male with hx of PMH of asthma, HFpEF, type 2 diabetes, and HTN, who presented with abdominal pain and admitted for severe symptomatic hyperglycemia   Principal Problem:   Severe hyperglycemia due to diabetes mellitus ([place])  Severe symptomatic hyperglycemia Newly Diagnosed type 2 diabetes mellitus Patient received diabetes education from diabetic educator yesterday.  He was provided a glucose meter and all meds have been sent to [facility] pending discharge. CBGs still elevated in the 260s to 350s in the last 24 hours.  He reports feeling overwhelmed with a new diagnosis of diabetes and management plans but states that his symptoms have improved. Current A1c is still pending, but chart review  showed patient had A1c of 7.1% in March 2022.  States he was not informed in the past about his diabetes diagnosis but reports a family history of diabetes.  -Increase Semglee to 20 units daily -Change SSI q4h to SSI QID, with meals and at bedtime -Continue Jardiance and metformin -Follow-up C-peptide -Pending discharge tomorrow.  Discharge medications have already been sent to [facility].  Plan to schedule hospital follow-up with [facility].  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  Triglyceride trending down.  Direct LDL normal at 73.  He has been tolerating p.o. intake. -Continue atorvastatin 80 mg  daily -Fenofibrate 54 mg daily -Repeat lipid panel in 6 to 8 weeks   Hx HFpEF Hx HTN Stable and euvolemic. Repeat echo shows EF 55%, G1DD but no valvular abnormalities. Blood pressure remained stable with SBP in the 110s to 130s. -Discontinue on telemetry -Continue Jardiance  Diet: CM IVF: None VTE: Xarelto CODE: Full  Prior to Admission Living Arrangement: Home Anticipated Discharge Location: Home Barriers to Discharge: Adequate stability Dispo: Anticipated discharge tomorrow morning  Signed: Lacinda Axon, MD 05/26/2022, 7:37 AM  Pager: [phone] Internal Medicine Teaching Service After 5pm on weekdays and 1pm on weekends: On Call pager: [phone]

## 2022-05-27 DIAGNOSIS — Z794 Long term (current) use of insulin: Secondary | ICD-10-CM

## 2022-05-27 DIAGNOSIS — E1165 Type 2 diabetes mellitus with hyperglycemia: Secondary | ICD-10-CM

## 2022-05-27 DIAGNOSIS — R109 Unspecified abdominal pain: Secondary | ICD-10-CM

## 2022-05-27 DIAGNOSIS — Z7984 Long term (current) use of oral hypoglycemic drugs: Secondary | ICD-10-CM

## 2022-05-27 LAB — COMPREHENSIVE METABOLIC PANEL
ALT: 27 U/L (ref 0–44)
AST: 18 U/L (ref 15–41)
Albumin: 3.1 g/dL — ABNORMAL LOW (ref 3.5–5.0)
Alkaline Phosphatase: 87 U/L (ref 38–126)
Anion gap: 8 (ref 5–15)
BUN: 13 mg/dL (ref 6–20)
CO2: 19 mmol/L — ABNORMAL LOW (ref 22–32)
Calcium: 8.6 mg/dL — ABNORMAL LOW (ref 8.9–10.3)
Chloride: 106 mmol/L (ref 98–111)
Creatinine, Ser: 0.75 mg/dL (ref 0.61–1.24)
GFR, Estimated: >60 mL/min (ref >60)
Glucose, Bld: 306 mg/dL — ABNORMAL HIGH (ref 70–99)
Potassium: 4.1 mmol/L (ref 3.5–5.1)
Sodium: 133 mmol/L — ABNORMAL LOW (ref 135–145)
Total Bilirubin: 0.7 mg/dL (ref 0.3–1.2)
Total Protein: 5.4 g/dL — ABNORMAL LOW (ref 6.5–8.1)

## 2022-05-27 LAB — TRIGLYCERIDES: Triglycerides: 707 mg/dL — ABNORMAL HIGH (ref <150)

## 2022-05-27 LAB — GLUCOSE, CAPILLARY: Glucose-Capillary: 276 mg/dL — ABNORMAL HIGH (ref 70–99)

## 2022-05-27 LAB — C-PEPTIDE: C-Peptide: 2.5 ng/mL (ref 1.1–4.4)

## 2022-05-27 LAB — MAGNESIUM: Magnesium: 1.7 mg/dL (ref 1.7–2.4)

## 2022-05-27 MED ORDER — GABAPENTIN 600 MG PO TABS
300.0000 mg | ORAL_TABLET | Freq: Three times a day (TID) | ORAL | Status: DC
  Administered 2022-05-27: 300 mg via ORAL
  Filled 2022-05-27: qty 1

## 2022-05-27 MED ORDER — HYDROXYZINE HCL 10 MG PO TABS
10.0000 mg | ORAL_TABLET | Freq: Three times a day (TID) | ORAL | 2 refills | Status: DC | PRN
  Filled 2022-05-27: qty 30, 10d supply, fill #0

## 2022-05-27 MED ORDER — FAMOTIDINE 20 MG PO TABS
20.0000 mg | ORAL_TABLET | Freq: Every day | ORAL | Status: DC
  Administered 2022-05-27: 20 mg via ORAL
  Filled 2022-05-27: qty 1

## 2022-05-27 MED ORDER — INSULIN GLARGINE-YFGN 100 UNIT/ML ~~LOC~~ SOPN
20.0000 [IU] | PEN_INJECTOR | Freq: Every day | SUBCUTANEOUS | 11 refills | Status: DC
  Filled 2022-05-27: qty 15, 75d supply, fill #0

## 2022-05-27 MED ORDER — GABAPENTIN 300 MG PO CAPS
300.0000 mg | ORAL_CAPSULE | Freq: Three times a day (TID) | ORAL | 5 refills | Status: DC
  Filled 2022-05-27: qty 90, 30d supply, fill #0
  Filled 2022-09-05: qty 90, 30d supply, fill #1
  Filled 2022-10-15: qty 90, 30d supply, fill #2

## 2022-05-27 MED ORDER — INSULIN STARTER KIT- PEN NEEDLES (ENGLISH)
1.0000 | Freq: Once | 0 refills | Status: AC
  Filled 2022-05-27: qty 1, 1d supply, fill #0

## 2022-05-27 MED ORDER — FAMOTIDINE 20 MG PO TABS: 20.0000 mg | ORAL_TABLET | Freq: Every day | ORAL | Status: DC

## 2022-05-27 MED ORDER — MELATONIN 3 MG PO TABS: 3.0000 mg | ORAL_TABLET | Freq: Every evening | ORAL | 0 refills | Status: DC | PRN

## 2022-05-27 NOTE — Discharge Summary (Signed)
Name: Logan Lucas MRN: [id number] DOB: 22-Mar-1976 46 y.o. PCP: Patient, No Pcp Per  Date of Admission: 05/24/2022 12:27 PM Date of Discharge:   05/27/2022 Attending Physician: Dr. Cain Sieve  Discharge Diagnosis: Principal Problem:   Severe hyperglycemia due to diabetes mellitus [facility])   Discharge Medications: Allergies as of 05/27/2022   No Known Allergies      Medication List     STOP taking these medications    carvedilol 3.125 MG tablet Commonly known as: COREG   cyclobenzaprine 5 MG tablet Commonly known as: FLEXERIL   dicyclomine 10 MG capsule Commonly known as: Bentyl   isosorbide mononitrate 30 MG 24 hr tablet Commonly known as: IMDUR   nicotine 21 mg/24hr patch Commonly known as: NICODERM CQ - dosed in mg/24 hours   omeprazole 20 MG capsule Commonly known as: PriLOSEC   oxyCODONE 5 MG immediate release tablet Commonly known as: Oxy IR/ROXICODONE   prochlorperazine 5 MG tablet Commonly known as: COMPAZINE   sucralfate 1 g tablet Commonly known as: Carafate   SUMAtriptan 50 MG tablet Commonly known as: Imitrex       TAKE these medications    Accu-Chek Guide test strip Generic drug: glucose blood Check your blood sugar before eating each morning and before each meal.   Accu-Chek Guide w/Device Kit Check your blood sugar before eating each morning and before each meal.   Accu-Chek Softclix Lancets lancets Check your blood sugar before eating each morning and before each meal.   acetaminophen 500 MG tablet Commonly known as: TYLENOL Take 500 mg by mouth every 6 (six) hours as needed for moderate pain.   atorvastatin 80 MG tablet Commonly known as: LIPITOR Take 1 tablet (80 mg total) by mouth daily.   famotidine 20 MG tablet Commonly known as: PEPCID Take 1 tablet (20 mg total) by mouth daily. Start taking on: May 28, 2022 What changed: when to take this   fenofibrate 54 MG tablet Take 1 tablet (54 mg total) by mouth daily.    Gabapentin 50 MG Tabs Take 300 mg by mouth 3 (three) times daily.   hydrOXYzine 10 MG tablet Commonly known as: ATARAX Take 1 tablet (10 mg total) by mouth 3 (three) times daily as needed for anxiety.   ibuprofen 200 MG tablet Commonly known as: ADVIL Take 200 mg by mouth every 6 (six) hours as needed for mild pain.   insulin glargine-yfgn 100 UNIT/ML Pen Commonly known as: SEMGLEE Inject 20 Units into the skin daily.   insulin starter kit- pen needles Misc 1 kit by Other route once for 1 dose.   Jardiance 10 MG Tabs tablet Generic drug: empagliflozin Take 1 tablet (10 mg total) by mouth daily.   losartan 25 MG tablet Commonly known as: Cozaar Take 1 tablet (25 mg total) by mouth daily.   melatonin 3 MG Tabs tablet Take 1 tablet (3 mg total) by mouth at bedtime as needed.   metFORMIN 500 MG 24 hr tablet Commonly known as: GLUCOPHAGE-XR Take 1 tablet (500 mg total) by mouth daily with breakfast.        Disposition and follow-up:   Mr.Zavion V Reliford was discharged from [facility] in Stable condition.  At the hospital follow up visit please address:  1.  Follow-up:  a.  Severe symptomatic hyperglycemia: New diagnosis of type 2 diabetes. Patient provided diabetic education and started on long-acting insulin, metformin and Jardiance. Started on gabapentin for possible neuropathic pain. Please ensure patient is following  the recommended plan and adjust insulin based on meter readings. He will need to apply for the orange card so he can have medical coverage.    b.  Epigastric pain/hypertriglyceridemia: Initially thought to be pancreatitis but gastritis also on the differential. Started on famotidine and advised to buy over-the-counter. Re-evaluate abdominal symptoms.   c.  HFpEF: Stable and euvolemic during hospitalization. Repeat echo was stable. He was not on any meds prior to this admission. Started on Jardiance. Ensure patient remains euvolemic  2.   Labs / imaging needed at time of follow-up: N/A  3.  Pending labs/ test needing follow-up: Hemoglobin A1c  Follow-up Appointments:   Hospital Course by problem list: Severe symptomatic hyperglycemia Patient presented to [facility] with acute onset of symptoms including polyuria, polydipsia, abdominal pain, nausea, weight loss, vision changes, extremity numbness. Initial labs revealed severely elevated glucose of 788 with decreased bicarb of 19 and anion gap of 15. Fortunately K was normal. Urinalysis showed significant glucosuria but no ketonuria. Beta-hydroxybutyric acid negative. Patient was started on Endotool in Emergency Department and was able to transition off later that evening. Patient has new diagnosis of type II diabetes mellitus and was started on metformin and long acting insulin. On chart review, patient had a A1c of 7.1% during hospitalization last year. Patient received diabetes education from diabetic coordinator and practiced injecting himself with insulin prior to discharge. Patient was started on SGLT2i, metformin, Semglee and gabapentin for his diabetes. He was instructed to check his blood sugars 3 times daily. He is to follow-up with [facility] for further titration of medications as needed.  2.  Epigastric pain, severe hypertriglyceridemia Patient initially arrived to [facility] with epigastric abdominal pain and persistent nausea. Lipase mildly elevated at 58, no radiological evidence of pancreatitis. During initial work-up, patient was found to have significant triglycerides >2,000 which was thought to be causing early pancreatitis. He received IV fluids and insulin and his triglycerides trended down to the 700s by day of discharge. Patient's abdominal pain could also be gastritis so he was started on famotidine and advised to buy it over-the-counter. Patient was counseled on low-fat diet and started on atorvastatin 80 mg and fenofibrate 54 mg daily.   3. HFpEF During hospitalization,  Echocardiogram was completed which showed EF 35%, grade 1 diastolic dysfunction but no valvular abnormalities.  He remained euvolemic throughout his hospital stay.  He was discharged on Jardiance 10 mg daily.    Subjective: Patient was evaluated at the bedside laying comfortably in bed. States they move him to a different room because they found ants in his previous room. States his blurry vision has improved but not completely resolved. His abdominal pain is still there but improved from when he was admitted. He also reports decreased sensation and pain on the medial aspect of his left foot. He is interested in practicing how to inject his insulin so he knows how to do it at home. Discussed plan to let RN allow him to inject his insulin today.  Also discussed plan for discharge later today with plan to follow-up with [facility] next week.  Discharge Vitals:   BP 111/60 (BP Location: Left Arm)   Pulse 80   Temp 98.2 F (36.8 C) (Oral)   Resp 18   Ht 5' 9"  (1.753 m)   Wt 112.8 kg   SpO2 96%   BMI 36.71 kg/m   General: Pleasant, well-appearing middle-age man laying in bed. No acute distress. CV: RRR. No m/r/g. No LE edema Pulmonary: Lungs  CTAB. Normal effort. No wheezing or rales. Abdominal: Obese. Soft. Mild generalized tenderness to palpation. Normal bowel sounds. Extremities: 2+ distal pulses. Normal ROM. Skin: Warm and dry. No obvious rash or lesions. Neuro: A&Ox3. Moves all extremities. Mildly decreased sensation to the medial aspect of left foot. Psych: Normal mood and affect  Pertinent Labs, Studies, and Procedures:     Latest Ref Rng & Units 05/25/2022    8:16 AM 05/24/2022   12:55 PM 01/25/2021    4:42 AM  CBC  WBC 4.0 - 10.5 K/uL 7.0  6.7  7.0   Hemoglobin 13.0 - 17.0 g/dL 13.6  13.8  12.8   Hematocrit 39.0 - 52.0 % 39.3  42.2  39.0   Platelets 150 - 400 K/uL 233  270  264        Latest Ref Rng & Units 05/27/2022    3:12 AM 05/25/2022    8:16 AM 05/25/2022    4:38 AM  CMP   Glucose 70 - 99 mg/dL 306  290  224   BUN 6 - 20 mg/dL 13  8  9    Creatinine 0.61 - 1.24 mg/dL 0.75  0.79  0.89   Sodium 135 - 145 mmol/L 133  132  135   Potassium 3.5 - 5.1 mmol/L 4.1  3.7  3.6   Chloride 98 - 111 mmol/L 106  99  98   CO2 22 - 32 mmol/L 19  22  24    Calcium 8.9 - 10.3 mg/dL 8.6  9.1  9.5   Total Protein 6.5 - 8.1 g/dL 5.4  6.1    Total Bilirubin 0.3 - 1.2 mg/dL 0.7  0.7    Alkaline Phos 38 - 126 U/L 87  109    AST 15 - 41 U/L 18  27    ALT 0 - 44 U/L 27  28      ECHOCARDIOGRAM COMPLETE  Result Date: 05/25/2022    ECHOCARDIOGRAM REPORT   Patient Name:   ACEN CRAUN Date of Exam: 05/25/2022 Medical Rec #:  [id number]          Height:       69.0 in Accession #:    [id number]         Weight:       248.6 lb Date of Birth:  1976/01/05         BSA:          2.266 m Patient Age:    52 years           BP:           132/89 mmHg Patient Gender: M                  HR:           81 bpm. Exam Location:  Inpatient Procedure: 2D Echo, Cardiac Doppler and Color Doppler Indications:    Chest Pain R07.9  History:        Patient has prior history of Echocardiogram examinations, most                 recent 01/23/2021. CHF; Risk Factors:Hypertension and Diabetes.  Sonographer:    Darlina Sicilian RDCS Referring Phys: [id number] [place]  1. Abnormal septal motion . Left ventricular ejection fraction, by estimation, is 55%. The left ventricle has normal function. The left ventricle has no regional wall motion abnormalities. Left ventricular diastolic parameters are consistent with Grade I diastolic dysfunction (impaired relaxation).  2. Right  ventricular systolic function is normal. The right ventricular size is normal.  3. The mitral valve is normal in structure. No evidence of mitral valve regurgitation. No evidence of mitral stenosis.  4. The aortic valve is tricuspid. Aortic valve regurgitation is not visualized. No aortic stenosis is present.  5. The inferior vena cava is  normal in size with greater than 50% respiratory variability, suggesting right atrial pressure of 3 mmHg. FINDINGS  Left Ventricle: Abnormal septal motion. Left ventricular ejection fraction, by estimation, is 55%. The left ventricle has normal function. The left ventricle has no regional wall motion abnormalities. The left ventricular internal cavity size was normal  in size. There is no left ventricular hypertrophy. Left ventricular diastolic parameters are consistent with Grade I diastolic dysfunction (impaired relaxation). Right Ventricle: The right ventricular size is normal. No increase in right ventricular wall thickness. Right ventricular systolic function is normal. Left Atrium: Left atrial size was normal in size. Right Atrium: Right atrial size was normal in size. Pericardium: There is no evidence of pericardial effusion. Mitral Valve: The mitral valve is normal in structure. No evidence of mitral valve regurgitation. No evidence of mitral valve stenosis. Tricuspid Valve: The tricuspid valve is normal in structure. Tricuspid valve regurgitation is not demonstrated. No evidence of tricuspid stenosis. Aortic Valve: The aortic valve is tricuspid. Aortic valve regurgitation is not visualized. No aortic stenosis is present. Pulmonic Valve: The pulmonic valve was normal in structure. Pulmonic valve regurgitation is not visualized. No evidence of pulmonic stenosis. Aorta: The aortic root is normal in size and structure. Venous: The inferior vena cava is normal in size with greater than 50% respiratory variability, suggesting right atrial pressure of 3 mmHg. IAS/Shunts: No atrial level shunt detected by color flow Doppler.  LEFT VENTRICLE PLAX 2D LVIDd:         5.40 cm      Diastology LVIDs:         3.50 cm      LV e' medial:    6.46 cm/s LV PW:         1.00 cm      LV E/e' medial:  8.0 LV IVS:        1.10 cm      LV e' lateral:   9.57 cm/s LVOT diam:     2.10 cm      LV E/e' lateral: 5.4 LV SV:         55 LV SV  Index:   24 LVOT Area:     3.46 cm  LV Volumes (MOD) LV vol d, MOD A2C: 101.0 ml LV vol d, MOD A4C: 106.0 ml LV vol s, MOD A2C: 61.9 ml LV SV MOD A2C:     39.1 ml LV SV MOD A4C:     106.0 ml RIGHT VENTRICLE RV S prime:     15.80 cm/s TAPSE (M-mode): 2.0 cm LEFT ATRIUM             Index        RIGHT ATRIUM          Index LA diam:        3.10 cm 1.37 cm/m   RA Area:     6.86 cm LA Vol (A2C):   31.2 ml 13.77 ml/m  RA Volume:   11.30 ml 4.99 ml/m LA Vol (A4C):   39.1 ml 17.25 ml/m LA Biplane Vol: 35.7 ml 15.75 ml/m  AORTIC VALVE LVOT Vmax:   87.60 cm/s LVOT Vmean:  61.000 cm/s  LVOT VTI:    0.159 m  AORTA Ao Root diam: 3.80 cm Ao Asc diam:  3.30 cm MITRAL VALVE MV Area (PHT): 3.03 cm    SHUNTS MV Decel Time: 250 msec    Systemic VTI:  0.16 m MV E velocity: 51.70 cm/s  Systemic Diam: 2.10 cm MV A velocity: 67.70 cm/s MV E/A ratio:  0.76 Jenkins Rouge MD Electronically signed by Jenkins Rouge MD Signature Date/Time: 05/25/2022/3:41:31 PM    Final    CT ABDOMEN PELVIS W CONTRAST  Result Date: 05/24/2022 CLINICAL DATA:  Left lower quadrant pain EXAM: CT ABDOMEN AND PELVIS WITH CONTRAST TECHNIQUE: Multidetector CT imaging of the abdomen and pelvis was performed using the standard protocol following bolus administration of intravenous contrast. RADIATION DOSE REDUCTION: This exam was performed according to the departmental dose-optimization program which includes automated exposure control, adjustment of the mA and/or kV according to patient size and/or use of iterative reconstruction technique. CONTRAST:  135m OMNIPAQUE IOHEXOL 300 MG/ML  SOLN COMPARISON:  CT 08/31/2020, 01/23/2021, CT 06/17/2018 FINDINGS: Lower chest: No acute abnormality. Hepatobiliary: Hepatic steatosis. No calcified gallstone or biliary dilatation Pancreas: Unremarkable. No pancreatic ductal dilatation or surrounding inflammatory changes. Spleen: Normal in size without focal abnormality. 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 Kidneys show no hydronephrosis. Small cyst in the midpole of the left kidney, no follow-up imaging is recommended. The bladder is unremarkable Stomach/Bowel: Stomach is within normal limits. Appendix appears normal. No evidence of bowel wall thickening, distention, or inflammatory changes. Mild diverticular disease of the left colon without acute inflammatory process. Vascular/Lymphatic: Mild atherosclerosis. No aneurysm. No suspicious lymph nodes. Reproductive: Prostate is unremarkable. Other: Negative for pelvic effusion or free air. Musculoskeletal: No acute or significant osseous findings. IMPRESSION: 1. No CT evidence for acute intra-abdominal or pelvic abnormality. 2. Hepatic steatosis. 3. Mild diverticular disease of the left colon without acute inflammatory process Electronically Signed   By: KDonavan FoilM.D.   On: 05/24/2022 16:05     Discharge Instructions: Mr. BDorko  [facility] [place] [facility] sugars and stomach pain and was diagnosed with Type 2 diabetes. We have started you on medications to manage your diabetes. We want you to do your part and decrease the amount of sugary foods, simple carbs and sugary drinks in your diet. We are discharging you home now that you are doing better. Please follow the following instructions.   1) Inject 20 units of insulin every morning and check your blood sugar 3 times daily 2)Follow up with the Internal medicine clinic next week for a hospital follow up 3) For the diabetes, we have started you on Metformin 500 mg daily and Jardiance 10 mg daily 4) for your high cholesterol, we have started you on atorvastatin 80 mg daily and fenofibrate 54 mg daily 5) For your stomach pain, we want you to take over-the-counter famotidine or omeprazole daily 6) For your foot pain, have started you on gabapentin  300 mg 3 times daily 7) For your anxiety, we have started you on hydroxyzine 10 mg three times daily as needed for anxiety  Take care,  Dr. PLinwood Dibbles MD, MPH    Signed: ALacinda Axon MD 05/27/2022, 6:51 AM   Pager: [phone]

## 2022-05-27 NOTE — Discharge Instructions (Addendum)
Mr. Basaldua,  It was a pleasure taking care of you at [place] were admitted for very high blood sugars and stomach pain and was diagnosed with Type 2 diabetes. We have started you on medications to manage your diabetes. We want you to do your part and decrease the amount of sugary foods, simple carbs and sugary drinks in your diet. We are discharging you home now that you are doing better. Please follow the following instructions.   1) Inject 20 units of insulin every morning and check your blood sugar 3 times daily 2)Follow up with the Internal medicine clinic next week for a hospital follow up 3) For the diabetes, we have started you on Metformin 500 mg daily and Jardiance 10 mg daily 4) for your high cholesterol, we have started you on atorvastatin 80 mg daily and fenofibrate 54 mg daily 5) For your stomach pain, we want you to take over-the-counter famotidine or omeprazole daily 6) For your foot pain, have started you on gabapentin 300 mg 3 times daily 7) For your anxiety, we have started you on hydroxyzine 10 mg three times daily as needed for anxiety  Take care,  Dr. Linwood Dibbles, MD, MPH

## 2022-05-27 NOTE — Progress Notes (Signed)
CBG at 11:47 - 282.  Patient used his own meter due to teaching.  NT witness.

## 2022-05-28 ENCOUNTER — Other Ambulatory Visit (HOSPITAL_COMMUNITY): Payer: Self-pay

## 2022-05-28 LAB — HEMOGLOBIN A1C
Hgb A1c MFr Bld: >15.5 % — ABNORMAL HIGH (ref 4.8–5.6)
Mean Plasma Glucose: >398 mg/dL

## 2022-06-05 ENCOUNTER — Ambulatory Visit (INDEPENDENT_AMBULATORY_CARE_PROVIDER_SITE_OTHER): Payer: Self-pay | Admitting: Student

## 2022-06-05 ENCOUNTER — Other Ambulatory Visit (HOSPITAL_COMMUNITY): Payer: Self-pay

## 2022-06-05 ENCOUNTER — Encounter: Payer: Self-pay | Admitting: Student

## 2022-06-05 ENCOUNTER — Other Ambulatory Visit: Payer: Self-pay

## 2022-06-05 VITALS — BP 132/76 | HR 81 | Temp 98.2°F | Ht 69.0 in | Wt 270.9 lb

## 2022-06-05 DIAGNOSIS — I10 Essential (primary) hypertension: Secondary | ICD-10-CM

## 2022-06-05 DIAGNOSIS — E781 Pure hyperglyceridemia: Secondary | ICD-10-CM

## 2022-06-05 DIAGNOSIS — Z794 Long term (current) use of insulin: Secondary | ICD-10-CM

## 2022-06-05 DIAGNOSIS — E1142 Type 2 diabetes mellitus with diabetic polyneuropathy: Secondary | ICD-10-CM

## 2022-06-05 DIAGNOSIS — F419 Anxiety disorder, unspecified: Secondary | ICD-10-CM

## 2022-06-05 DIAGNOSIS — F17210 Nicotine dependence, cigarettes, uncomplicated: Secondary | ICD-10-CM

## 2022-06-05 MED ORDER — METFORMIN HCL ER 500 MG PO TB24
500.0000 mg | ORAL_TABLET | Freq: Two times a day (BID) | ORAL | 2 refills | Status: DC
  Filled 2022-06-05 – 2022-06-21 (×2): qty 60, 30d supply, fill #0
  Filled 2022-07-26: qty 60, 30d supply, fill #1
  Filled 2022-09-05: qty 60, 30d supply, fill #2

## 2022-06-05 MED ORDER — INSULIN GLARGINE 100 UNIT/ML SOLOSTAR PEN
25.0000 [IU] | PEN_INJECTOR | Freq: Every day | SUBCUTANEOUS | 11 refills | Status: DC
  Filled 2022-06-05: qty 6, 24d supply, fill #0
  Filled 2022-07-06: qty 6, 24d supply, fill #1
  Filled 2022-09-05: qty 6, 24d supply, fill #2
  Filled 2022-10-15: qty 6, 24d supply, fill #3

## 2022-06-05 MED ORDER — HYDROXYZINE HCL 25 MG PO TABS
25.0000 mg | ORAL_TABLET | Freq: Three times a day (TID) | ORAL | 1 refills | Status: DC | PRN
  Filled 2022-06-05: qty 90, 30d supply, fill #0
  Filled 2022-07-26: qty 90, 30d supply, fill #1

## 2022-06-05 MED ORDER — INSULIN ASPART 100 UNIT/ML FLEXPEN
10.0000 [IU] | PEN_INJECTOR | Freq: Three times a day (TID) | SUBCUTANEOUS | 3 refills | Status: DC
  Filled 2022-06-05: qty 9, 30d supply, fill #0
  Filled 2022-07-06: qty 9, 30d supply, fill #1
  Filled 2022-09-05: qty 9, 30d supply, fill #2
  Filled 2022-10-15: qty 9, 30d supply, fill #3

## 2022-06-05 NOTE — Assessment & Plan Note (Signed)
Patient was given hydroxyzine 10 mg 3 times daily as needed for anxiety.  Patient said that this not helping.  Will increase the dose to 25 mg.

## 2022-06-05 NOTE — Assessment & Plan Note (Addendum)
Patient was found to have triglyceride level in the 2000 on admission with abdominal pain.  He was started on IV insulin and fluids which resulted in improvement of triglyceride to 700.  There is a suspicion of pancreatitis but not very clear.  His LDL was only 73.  He was started on atorvastatin and fenofibrate.  Denies any side effects from this medication.  -Continue atorvastatin 80 mg and fenofibrate 54 mg daily -Recheck lipid panel at next visit

## 2022-06-05 NOTE — Assessment & Plan Note (Signed)
Blood pressure is well controlled at 132/76.  -No medication initiated.

## 2022-06-05 NOTE — Patient Instructions (Signed)
Mr. Logan Lucas,  It was a pleasure seeing you in the clinic today.  Here is a summary what we talked about:  1.  Type 2 diabetes: Your blood sugar is looking much better.  However you still have some low values from injecting insulin without eating a meal.  -Please inject 25 units of long-acting insulin (Lantus) daily -Please inject 10 units of short acting insulin (NovoLog) 3 times a day with meals.  Do not give NovoLog if you skip a meal.  Do not give NovoLog at bedtime. -Continue Jardiance 10 mg daily -Increase metformin to 500 mg twice daily. -You can take gabapentin 1 tablet at bedtime to avoid sedating effects. -Please reach out to your eye doctor for a diabetic eye exam.  2.  Please continue atorvastatin and fenofibrate for your high triglyceride level.  Please return in 3 months for repeat A1c and blood work.  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  Take care,   Dr. Alfonse Spruce

## 2022-06-05 NOTE — Assessment & Plan Note (Addendum)
This is a new diagnosis.  Patient was hospitalized in June for severe symptomatic hyperglycemia.  His CBG at that time was 788.  Fortunately there was no evidence of DKA (negative beta hydroxy and negative ketonuria).  His A1c was found to be greater than 15.  At discharge he was started on metformin 500 mg daily, Jardiance 10 mg daily and Semglee 20 units daily.  No GLP-1 for suspicion of pancreatitis.  Patient reports compliant with these medications.  He however also taking NovoLog 15 units 3 times a day with meals and at bedtime.  NovoLog was not found in the discharge medication list?  Patient reports several events of hypoglycemia with CBG in the 70s due to giving insulin without meals.  His most recent event with today when he administered NovoLog without meal.  His glucometer report reviewed several low values in the 70 during mid days.  His fasting glucose is elevated from 170-295.  -Increase long-acting insulin to 25 units daily.  Switch to Lantus to be covered under the IM program. -Reduce short acting insulin NovoLog to 10 units 3 times daily with meals.  No bedtime correction. -Patient is advised to do not administer insulin when skipping a meal. -Continue Jardiance 10 mg daily -Increase metformin to 500 mg twice daily -Foot exam performed today -Patient will reach out to [facility] or [facility] for an eye exam. -Check urine microalbumin/creatinine -Patient is advised to take gabapentin 300 mg tablet at bedtime only to avoid sedating effects. -A1c in 3 months

## 2022-06-05 NOTE — Progress Notes (Signed)
CC: Hospital follow-up  HPI:  [name] is a 46 y.o. with past medical history of new diagnosis of diabetes who present to the clinic today from his recent hospitalization for hyperglycemia.  Please see problem based charting for detail  Past Medical History:  Diagnosis Date   Asthma    GI bleed    Hypertension    Review of Systems:  per HPI  Physical Exam:  Vitals:   06/05/22 1401  BP: 132/76  Pulse: 81  Temp: 98.2 F (36.8 C)  TempSrc: Oral  SpO2: 100%  Weight: 270 lb 14.4 oz (122.9 kg)  Height: '5\' 9"'$  (1.753 m)   Physical Exam Constitutional:      General: He is not in acute distress.    Appearance: He is not ill-appearing.  HENT:     Head: Normocephalic.  Eyes:     General:        Right eye: No discharge.        Left eye: No discharge.     Conjunctiva/sclera: Conjunctivae normal.  Cardiovascular:     Rate and Rhythm: Normal rate and regular rhythm.  Pulmonary:     Effort: Pulmonary effort is normal. No respiratory distress.     Breath sounds: Normal breath sounds. No wheezing.  Abdominal:     General: Bowel sounds are normal. There is no distension.     Palpations: Abdomen is soft.     Tenderness: There is no abdominal tenderness.  Musculoskeletal:        General: Normal range of motion.  Skin:    General: Skin is warm.     Comments: +2 pedis pulses palpated bilaterally.  No obvious open wound noted.  Neurological:     Mental Status: He is alert and oriented to person, place, and time.  Psychiatric:        Mood and Affect: Mood normal.      Assessment & Plan:   See Encounters Tab for problem based charting.  Hypertension Blood pressure is well controlled at 132/76.  -No medication initiated.  T2DM (type 2 diabetes mellitus) ([place]) This is a new diagnosis.  Patient was hospitalized in June for severe symptomatic hyperglycemia.  His CBG at that time was 788.  Fortunately there was no evidence of DKA (negative beta hydroxy and  negative ketonuria).  His A1c was found to be greater than 15.  At discharge he was started on metformin 500 mg daily, Jardiance 10 mg daily and Semglee 20 units daily.  No GLP-1 for suspicion of pancreatitis.  Patient reports compliant with these medications.  He however also taking NovoLog 15 units 3 times a day with meals and at bedtime.  NovoLog was not found in the discharge medication list?  Patient reports several events of hypoglycemia with CBG in the 70s due to giving insulin without meals.  His most recent event with today when he administered NovoLog without meal.  His glucometer report reviewed several low values in the 70 during mid days.  His fasting glucose is elevated from 170-295.  -Increase long-acting insulin to 25 units daily.  Switch to Lantus to be covered under the IM program. -Reduce short acting insulin NovoLog to 10 units 3 times daily with meals.  No bedtime correction. -Patient is advised to do not administer insulin when skipping a meal. -Continue Jardiance 10 mg daily -Increase metformin to 500 mg twice daily -Foot exam performed today -Patient will reach out to [facility] or [facility] for an eye exam. -  Check urine microalbumin/creatinine -Patient is advised to take gabapentin 300 mg tablet at bedtime only to avoid sedating effects. -A1c in 3 months   Hypertriglyceridemia Patient was found to have triglyceride level in the 2000 on admission with abdominal pain.  He was started on IV insulin and fluids which resulted in improvement of triglyceride to 700.  There is a suspicion of pancreatitis but not very clear.  His LDL was only 73.  He was started on atorvastatin and fenofibrate.  Denies any side effects from this medication.  -Continue atorvastatin 80 mg and fenofibrate 54 mg daily -Recheck lipid panel at next visit  Anxiety Patient was given hydroxyzine 10 mg 3 times daily as needed for anxiety.  Patient said that this not helping.  Will increase the dose  to 25 mg.   Patient discussed with Dr. Dareen Piano

## 2022-06-06 ENCOUNTER — Other Ambulatory Visit (HOSPITAL_COMMUNITY): Payer: Self-pay

## 2022-06-06 LAB — MICROALBUMIN / CREATININE URINE RATIO
Creatinine, Urine: 139.5 mg/dL
Microalb/Creat Ratio: 4 mg/g creat (ref 0–29)
Microalbumin, Urine: 5.2 ug/mL

## 2022-06-06 NOTE — Progress Notes (Signed)
Internal Medicine Clinic Attending  Case discussed with Dr. Nguyen  At the time of the visit.  We reviewed the resident's history and exam and pertinent patient test results.  I agree with the assessment, diagnosis, and plan of care documented in the resident's note. 

## 2022-06-07 ENCOUNTER — Other Ambulatory Visit (HOSPITAL_COMMUNITY): Payer: Self-pay

## 2022-06-12 ENCOUNTER — Other Ambulatory Visit (HOSPITAL_COMMUNITY): Payer: Self-pay

## 2022-06-21 ENCOUNTER — Other Ambulatory Visit (HOSPITAL_COMMUNITY): Payer: Self-pay

## 2022-06-21 ENCOUNTER — Other Ambulatory Visit: Payer: Self-pay | Admitting: Internal Medicine

## 2022-06-21 MED ORDER — FENOFIBRATE 54 MG PO TABS
54.0000 mg | ORAL_TABLET | Freq: Every day | ORAL | 2 refills | Status: DC
Start: 2022-06-21 — End: 2022-10-15
  Filled 2022-06-21: qty 30, 30d supply, fill #0
  Filled 2022-07-26: qty 30, 30d supply, fill #1
  Filled 2022-09-05: qty 30, 30d supply, fill #2

## 2022-06-21 MED ORDER — PENTIPS 31G X 8 MM MISC
0 refills | Status: DC
Start: 2022-06-21 — End: 2024-07-17
  Filled 2022-06-21: qty 100, 25d supply, fill #0

## 2022-06-21 MED ORDER — ATORVASTATIN CALCIUM 80 MG PO TABS
80.0000 mg | ORAL_TABLET | Freq: Every day | ORAL | 2 refills | Status: DC
  Filled 2022-06-26: qty 30, 30d supply, fill #0
  Filled 2022-07-26 (×2): qty 30, 30d supply, fill #1
  Filled 2022-09-05: qty 30, 30d supply, fill #2

## 2022-06-21 MED ORDER — EMPAGLIFLOZIN 10 MG PO TABS
10.0000 mg | ORAL_TABLET | Freq: Every day | ORAL | 2 refills | Status: DC
Start: 2022-06-21 — End: 2022-10-15
  Filled 2022-06-21: qty 30, 30d supply, fill #0
  Filled 2022-07-26: qty 30, 30d supply, fill #1
  Filled 2022-09-05: qty 30, 30d supply, fill #2

## 2022-06-21 NOTE — Addendum Note (Signed)
Addended by: Riesa Pope on: 06/21/2022 01:54 PM   Modules accepted: Orders

## 2022-06-22 ENCOUNTER — Other Ambulatory Visit (HOSPITAL_COMMUNITY): Payer: Self-pay

## 2022-06-26 ENCOUNTER — Other Ambulatory Visit (HOSPITAL_COMMUNITY): Payer: Self-pay

## 2022-07-06 ENCOUNTER — Other Ambulatory Visit (HOSPITAL_COMMUNITY): Payer: Self-pay

## 2022-07-10 ENCOUNTER — Emergency Department (HOSPITAL_BASED_OUTPATIENT_CLINIC_OR_DEPARTMENT_OTHER): Payer: Self-pay

## 2022-07-10 ENCOUNTER — Encounter (HOSPITAL_COMMUNITY): Payer: Self-pay | Admitting: Emergency Medicine

## 2022-07-10 ENCOUNTER — Emergency Department (HOSPITAL_COMMUNITY)
Admission: EM | Admit: 2022-07-10 | Discharge: 2022-07-11 | Disposition: A | Discharge status: Home / Self Care | Payer: Self-pay | Attending: Emergency Medicine | Admitting: Emergency Medicine

## 2022-07-10 ENCOUNTER — Other Ambulatory Visit: Payer: Self-pay

## 2022-07-10 DIAGNOSIS — M549 Dorsalgia, unspecified: Secondary | ICD-10-CM | POA: Insufficient documentation

## 2022-07-10 DIAGNOSIS — R1084 Generalized abdominal pain: Secondary | ICD-10-CM | POA: Insufficient documentation

## 2022-07-10 DIAGNOSIS — M7661 Achilles tendinitis, right leg: Secondary | ICD-10-CM | POA: Insufficient documentation

## 2022-07-10 DIAGNOSIS — M79604 Pain in right leg: Secondary | ICD-10-CM

## 2022-07-10 DIAGNOSIS — M79671 Pain in right foot: Secondary | ICD-10-CM | POA: Insufficient documentation

## 2022-07-10 DIAGNOSIS — G8929 Other chronic pain: Secondary | ICD-10-CM

## 2022-07-10 LAB — CBC WITH DIFFERENTIAL/PLATELET
Abs Immature Granulocytes: 0.04 10*3/uL (ref 0.00–0.07)
Basophils Absolute: 0 10*3/uL (ref 0.0–0.1)
Basophils Relative: 0 %
Eosinophils Absolute: 0.4 10*3/uL (ref 0.0–0.5)
Eosinophils Relative: 3 %
HCT: 44.7 % (ref 39.0–52.0)
Hemoglobin: 13.7 g/dL (ref 13.0–17.0)
Immature Granulocytes: 0 %
Lymphocytes Relative: 29 %
Lymphs Abs: 3.3 10*3/uL (ref 0.7–4.0)
MCH: 26.2 pg (ref 26.0–34.0)
MCHC: 30.6 g/dL (ref 30.0–36.0)
MCV: 85.5 fL (ref 80.0–100.0)
Monocytes Absolute: 0.7 10*3/uL (ref 0.1–1.0)
Monocytes Relative: 6 %
Neutro Abs: 6.8 10*3/uL (ref 1.7–7.7)
Neutrophils Relative %: 62 %
Platelets: 293 10*3/uL (ref 150–400)
RBC: 5.23 MIL/uL (ref 4.22–5.81)
RDW: 13.5 % (ref 11.5–15.5)
WBC: 11.2 10*3/uL — ABNORMAL HIGH (ref 4.0–10.5)
nRBC: 0 % (ref 0.0–0.2)

## 2022-07-10 LAB — COMPREHENSIVE METABOLIC PANEL
ALT: 17 U/L (ref 0–44)
AST: 16 U/L (ref 15–41)
Albumin: 4.3 g/dL (ref 3.5–5.0)
Alkaline Phosphatase: 95 U/L (ref 38–126)
Anion gap: 9 (ref 5–15)
BUN: 12 mg/dL (ref 6–20)
CO2: 23 mmol/L (ref 22–32)
Calcium: 9.5 mg/dL (ref 8.9–10.3)
Chloride: 108 mmol/L (ref 98–111)
Creatinine, Ser: 1.02 mg/dL (ref 0.61–1.24)
GFR, Estimated: >60 mL/min (ref >60)
Glucose, Bld: 110 mg/dL — ABNORMAL HIGH (ref 70–99)
Potassium: 4.1 mmol/L (ref 3.5–5.1)
Sodium: 140 mmol/L (ref 135–145)
Total Bilirubin: 0.3 mg/dL (ref 0.3–1.2)
Total Protein: 7.4 g/dL (ref 6.5–8.1)

## 2022-07-10 LAB — LIPASE, BLOOD: Lipase: 55 U/L — ABNORMAL HIGH (ref 11–51)

## 2022-07-10 NOTE — Progress Notes (Signed)
Lower extremity venous right study completed.  Preliminary results relayed to PA in triage.  See CV Proc for preliminary results report.   Darlin Coco, RDMS, RVT

## 2022-07-10 NOTE — ED Provider Triage Note (Signed)
Emergency Medicine Provider Triage Evaluation Note  Logan Lucas , a 46 y.o. male  was evaluated in triage.  Pt complains of abdominal pain.  States is in the lower abdomen and moves to the back bilaterally.  Denies any dysuria, hematuria, nausea, vomiting.  He is also having right atraumatic leg pain which is worse posteriorly.  No chest pain or shortness of breath.  Denies any recent travel or surgery.  Patient works at [employer]  Review of Systems  Per HPI  Physical Exam  BP (!) 130/93   Pulse 82   Temp 98.4 F (36.9 C) (Oral)   Resp 14   SpO2 100%  Gen:   Awake, no distress   Resp:  Normal effort  MSK:   Moves extremities without difficulty  Other:  DP PT 2+ bilaterally.  Posterior calf tenderness to right lower limb although roughly symmetric bilaterally.  Lower abdominal tenderness without rigidity or guarding.  Medical Decision Making  Medically screening exam initiated at 4:29 PM.  Appropriate orders placed.  Logan Lucas was informed that the remainder of the evaluation will be completed by another provider, this initial triage assessment does not replace that evaluation, and the importance of remaining in the ED until their evaluation is complete.  Abdominal labs, DVT rule out.   Logan Raring, PA-C 07/10/22 1630

## 2022-07-10 NOTE — ED Triage Notes (Signed)
Pt presents with abdominal pain and also back pain and leg pain with achilles tendon pain on the right.   No n/v/d or shob.

## 2022-07-11 ENCOUNTER — Emergency Department (HOSPITAL_COMMUNITY): Payer: Self-pay

## 2022-07-11 MED ORDER — IOHEXOL 300 MG/ML  SOLN
100.0000 mL | Freq: Once | INTRAMUSCULAR | Status: AC | PRN
  Administered 2022-07-11: 100 mL via INTRAVENOUS

## 2022-07-11 MED ORDER — CELECOXIB 200 MG PO CAPS: 200.0000 mg | ORAL_CAPSULE | Freq: Two times a day (BID) | ORAL | 0 refills | Status: DC

## 2022-07-11 NOTE — ED Provider Notes (Signed)
[place] EMERGENCY DEPARTMENT Provider Note   CSN: [id number] Arrival date & time: 07/10/22  1434     History  Chief Complaint  Patient presents with   Abdominal Pain   Back Pain   Leg Pain    Logan Lucas is a 46 y.o. male who presents emergency department with multiple complaints.  Patient complains of pain across his lower abdomen states he has had pain for the past 2 years.  He complains that in the past he has had episodes of diverticulitis and feels like it might be that but he also thinks it might be his pancreas and "just want to find out what is going on socket have some, surgery to make it stop."  Currently his pain is normal.  He denies fevers chills nausea vomiting or diarrhea.  Patient is also having pain in his right foot and Achilles tendon.  He had a DVT study earlier per triage provider that is negative for acute DVT.  Patient complains of pain in his Achilles tendon which is worse with ambulation better with rest.  He states that his whole leg is aching.  Has not taken any medication for pain relief  The history is provided by the patient. No language interpreter was used.  Abdominal Pain      Home Medications Prior to Admission medications   Medication Sig Start Date End Date Taking? Authorizing Provider  Accu-Chek Softclix Lancets lancets Check your blood sugar before eating each morning and before each meal. 05/25/22   Farrel Gordon, DO  acetaminophen (TYLENOL) 500 MG tablet Take 500 mg by mouth every 6 (six) hours as needed for moderate pain.    [provider]  atorvastatin (LIPITOR) 80 MG tablet Take 1 tablet (80 mg total) by mouth daily. 06/21/22   Riesa Pope, MD  blood glucose meter kit and supplies KIT Check your blood sugar before eating each morning and before each meal. 05/25/22   Farrel Gordon, DO  empagliflozin (JARDIANCE) 10 MG TABS tablet Take 1 tablet (10 mg total) by mouth daily. 06/21/22   Riesa Pope, MD  famotidine (PEPCID) 20 MG tablet Take 1 tablet (20 mg total) by mouth daily. 05/28/22   Lacinda Axon, MD  fenofibrate 54 MG tablet Take 1 tablet (54 mg total) by mouth daily. 06/21/22   Katsadouros, Vasilios, MD  gabapentin (NEURONTIN) 300 MG capsule Take 1 capsule (300 mg total) by mouth 3 (three) times daily. 05/27/22   Lacinda Axon, MD  glucose blood test strip Check your blood sugar before eating each morning and before each meal. 05/25/22   Farrel Gordon, DO  hydrOXYzine (ATARAX) 25 MG tablet Take 1 tablet (25 mg total) by mouth 3 (three) times daily as needed. 06/05/22   Gaylan Gerold, DO  ibuprofen (ADVIL) 200 MG tablet Take 200 mg by mouth every 6 (six) hours as needed for mild pain.    [provider]  insulin aspart (NOVOLOG) 100 UNIT/ML FlexPen Inject 10 Units into the skin 3 (three) times daily with meals. 06/05/22   Gaylan Gerold, DO  insulin glargine (LANTUS) 100 UNIT/ML Solostar Pen Inject 25 Units into the skin daily. 06/05/22   Gaylan Gerold, DO  Insulin Pen Needle (PENTIPS) 31G X 8 MM MISC Check your blood sugar before eating each morning and before each meal. 06/21/22   Gaylan Gerold, DO  losartan (COZAAR) 25 MG tablet Take 1 tablet (25 mg total) by mouth daily. 01/26/21 01/26/22  Wouk, Ailene Rud,  MD  melatonin 3 MG TABS tablet Take 1 tablet (3 mg total) by mouth at bedtime as needed. 05/27/22   Lacinda Axon, MD  metFORMIN (GLUCOPHAGE-XR) 500 MG 24 hr tablet Take 1 tablet (500 mg total) by mouth 2 (two) times daily. 06/05/22 09/03/22  Gaylan Gerold, DO      Allergies    Patient has no known allergies.    Review of Systems   Review of Systems  Gastrointestinal:  Positive for abdominal pain.    Physical Exam Updated Vital Signs BP 127/84 (BP Location: Right Arm)   Pulse 73   Temp 97.6 F (36.4 C) (Oral)   Resp 17   SpO2 98%  Physical Exam Vitals and nursing note reviewed.  Constitutional:      General: He is not in acute distress.     Appearance: He is well-developed. He is not diaphoretic.  HENT:     Head: Normocephalic and atraumatic.  Eyes:     General: No scleral icterus.    Conjunctiva/sclera: Conjunctivae normal.  Cardiovascular:     Rate and Rhythm: Normal rate and regular rhythm.     Heart sounds: Normal heart sounds.  Pulmonary:     Effort: Pulmonary effort is normal. No respiratory distress.     Breath sounds: Normal breath sounds.  Abdominal:     Palpations: Abdomen is soft.     Tenderness: There is generalized abdominal tenderness.  Musculoskeletal:     Cervical back: Normal range of motion and neck supple.  Skin:    General: Skin is warm and dry.  Neurological:     Mental Status: He is alert.  Psychiatric:        Behavior: Behavior normal.     ED Results / Procedures / Treatments   Labs (all labs ordered are listed, but only abnormal results are displayed) Labs Reviewed  CBC WITH DIFFERENTIAL/PLATELET - Abnormal; Notable for the following components:      Result Value   WBC 11.2 (*)    All other components within normal limits  COMPREHENSIVE METABOLIC PANEL - Abnormal; Notable for the following components:   Glucose, Bld 110 (*)    All other components within normal limits  LIPASE, BLOOD - Abnormal; Notable for the following components:   Lipase 55 (*)    All other components within normal limits  URINALYSIS, ROUTINE W REFLEX MICROSCOPIC    EKG None  Radiology VAS [place] LOWER EXTREMITY VENOUS (DVT) (7a-7p)  Result Date: 07/10/2022  Lower Venous DVT Study Patient Name:  Logan Lucas  Date of Exam:   07/10/2022 Medical Rec #: [id number]           Accession #:    [id number] Date of Birth: Jul 16, 1976          Patient Gender: M Patient Age:   89 years Exam Location:  [facility] Procedure:      VAS [place] LOWER EXTREMITY VENOUS (DVT) Referring Phys: HALEY SAGE --------------------------------------------------------------------------------  Indications: Pain in right leg.  Comparison  Study: No prior studies. Performing Technologist: Darlin Coco RDMS, RVT  Examination Guidelines: A complete evaluation includes B-mode imaging, spectral Doppler, color Doppler, and power Doppler as needed of all accessible portions of each vessel. Bilateral testing is considered an integral part of a complete examination. Limited examinations for reoccurring indications may be performed as noted. The reflux portion of the exam is performed with the patient in reverse Trendelenburg.  +---------+---------------+---------+-----------+----------+--------------+ RIGHT    CompressibilityPhasicitySpontaneityPropertiesThrombus Aging +---------+---------------+---------+-----------+----------+--------------+ CFV  Full           Yes      Yes                                 +---------+---------------+---------+-----------+----------+--------------+ SFJ      Full                                                        +---------+---------------+---------+-----------+----------+--------------+ FV Prox  Full                                                        +---------+---------------+---------+-----------+----------+--------------+ FV Mid   Full                                                        +---------+---------------+---------+-----------+----------+--------------+ FV DistalFull                                                        +---------+---------------+---------+-----------+----------+--------------+ PFV      Full                                                        +---------+---------------+---------+-----------+----------+--------------+ POP      Full           Yes      Yes                                 +---------+---------------+---------+-----------+----------+--------------+ PTV      Full                                                        +---------+---------------+---------+-----------+----------+--------------+ PERO     Full                                                         +---------+---------------+---------+-----------+----------+--------------+   +----+---------------+---------+-----------+----------+--------------+ LEFTCompressibilityPhasicitySpontaneityPropertiesThrombus Aging +----+---------------+---------+-----------+----------+--------------+ CFV Full           Yes      Yes                                 +----+---------------+---------+-----------+----------+--------------+  Summary: RIGHT: - There is no evidence of deep vein thrombosis in the lower extremity.  - No cystic structure found in the popliteal fossa.  LEFT: - No evidence of common femoral vein obstruction.  *See table(s) above for measurements and observations. Electronically signed by Harold Barban MD on 07/10/2022 at 8:11:16 PM.    Final     Procedures Procedures    Medications Ordered in ED Medications - No data to display  ED Course/ Medical Decision Making/ A&P Clinical Course as of 07/11/22 0454  Wed Jul 11, 2022  0341 Had a long discussion with the patient about his lab results, chronic abdominal pain however he is extremely insistent that he needs a CT scan. [AH]    Clinical Course User Index [AH] Margarita Mail, PA-C                           Medical Decision Making Patient here with abdominal pain.  The differential diagnosis for generalized abdominal pain includes, but is not limited to AAA, gastroenteritis, appendicitis, Bowel obstruction, Bowel perforation. Gastroparesis, DKA, Hernia, Inflammatory bowel disease, mesenteric ischemia, pancreatitis, peritonitis SBP, volvulus.  After review of all data points patient does not appear to have any acute cause of his abdominal pain.  Also having leg pain, no evidence of DVT likely Achilles tendinitis.  I reviewed all labs which are reassuring.  I have reviewed the patient's CT images and I have interpreted them.  No acute findings to explain the patient's  abdominal pain today.  We will have the patient follow-up with his primary care physician and podiatry.  Patient appears otherwise appropriate for discharge.  Amount and/or Complexity of Data Reviewed Labs: ordered. Radiology: ordered and independent interpretation performed.  Risk Prescription drug management.          Final Clinical Impression(s) / ED Diagnoses Final diagnoses:  None    Rx / DC Orders ED Discharge Orders     None         Margarita Mail, PA-C 07/11/22 0540    Orpah Greek, MD 07/11/22 [phone]

## 2022-07-11 NOTE — Discharge Instructions (Signed)
Your CT scan showed not acute abnormality. You will need to see  your primary care doctor.  You will also need to follow up with the podiatrist regarding your achilles pain.  Get help right away if: Your pain does not go away as soon as your health care provider told you to expect. You cannot stop vomiting. Your pain is only in areas of the abdomen, such as the right side or the left lower portion of the abdomen. Pain on the right side could be caused by appendicitis. You have bloody or black stools, or stools that look like tar. You have severe pain, cramping, or bloating in your abdomen. You have signs of dehydration, such as: Dark urine, very little urine, or no urine. Cracked lips. Dry mouth. Sunken eyes. Sleepiness. Weakness. You have trouble breathing or chest pain.

## 2022-07-26 ENCOUNTER — Other Ambulatory Visit (HOSPITAL_COMMUNITY): Payer: Self-pay

## 2022-07-31 ENCOUNTER — Encounter (HOSPITAL_COMMUNITY): Payer: Self-pay | Admitting: Emergency Medicine

## 2022-07-31 ENCOUNTER — Inpatient Hospital Stay (HOSPITAL_COMMUNITY)
Admission: EM | Admit: 2022-07-31 | Discharge: 2022-08-04 | DRG: 392 | Disposition: A | Discharge status: Home / Self Care | Payer: Self-pay | Attending: Internal Medicine | Admitting: Internal Medicine

## 2022-07-31 ENCOUNTER — Emergency Department (HOSPITAL_COMMUNITY): Payer: Self-pay

## 2022-07-31 DIAGNOSIS — Z79899 Other long term (current) drug therapy: Secondary | ICD-10-CM

## 2022-07-31 DIAGNOSIS — E781 Pure hyperglyceridemia: Secondary | ICD-10-CM | POA: Diagnosis present

## 2022-07-31 DIAGNOSIS — T402X5A Adverse effect of other opioids, initial encounter: Secondary | ICD-10-CM | POA: Diagnosis not present

## 2022-07-31 DIAGNOSIS — Z833 Family history of diabetes mellitus: Secondary | ICD-10-CM

## 2022-07-31 DIAGNOSIS — E1140 Type 2 diabetes mellitus with diabetic neuropathy, unspecified: Secondary | ICD-10-CM | POA: Diagnosis present

## 2022-07-31 DIAGNOSIS — K5792 Diverticulitis of intestine, part unspecified, without perforation or abscess without bleeding: Principal | ICD-10-CM | POA: Diagnosis present

## 2022-07-31 DIAGNOSIS — E1165 Type 2 diabetes mellitus with hyperglycemia: Secondary | ICD-10-CM | POA: Diagnosis present

## 2022-07-31 DIAGNOSIS — F419 Anxiety disorder, unspecified: Secondary | ICD-10-CM | POA: Diagnosis present

## 2022-07-31 DIAGNOSIS — K5732 Diverticulitis of large intestine without perforation or abscess without bleeding: Principal | ICD-10-CM | POA: Diagnosis present

## 2022-07-31 DIAGNOSIS — Z7984 Long term (current) use of oral hypoglycemic drugs: Secondary | ICD-10-CM

## 2022-07-31 DIAGNOSIS — L299 Pruritus, unspecified: Secondary | ICD-10-CM | POA: Diagnosis not present

## 2022-07-31 DIAGNOSIS — E7800 Pure hypercholesterolemia, unspecified: Secondary | ICD-10-CM | POA: Diagnosis present

## 2022-07-31 DIAGNOSIS — E669 Obesity, unspecified: Secondary | ICD-10-CM | POA: Diagnosis present

## 2022-07-31 DIAGNOSIS — F17210 Nicotine dependence, cigarettes, uncomplicated: Secondary | ICD-10-CM | POA: Diagnosis present

## 2022-07-31 DIAGNOSIS — Z794 Long term (current) use of insulin: Secondary | ICD-10-CM

## 2022-07-31 LAB — CBC WITH DIFFERENTIAL/PLATELET
Abs Immature Granulocytes: 0.05 10*3/uL (ref 0.00–0.07)
Basophils Absolute: 0 10*3/uL (ref 0.0–0.1)
Basophils Relative: 0 %
Eosinophils Absolute: 0.1 10*3/uL (ref 0.0–0.5)
Eosinophils Relative: 1 %
HCT: 42.1 % (ref 39.0–52.0)
Hemoglobin: 13.4 g/dL (ref 13.0–17.0)
Immature Granulocytes: 0 %
Lymphocytes Relative: 16 %
Lymphs Abs: 2.3 10*3/uL (ref 0.7–4.0)
MCH: 26.6 pg (ref 26.0–34.0)
MCHC: 31.8 g/dL (ref 30.0–36.0)
MCV: 83.7 fL (ref 80.0–100.0)
Monocytes Absolute: 1.4 10*3/uL — ABNORMAL HIGH (ref 0.1–1.0)
Monocytes Relative: 10 %
Neutro Abs: 10.4 10*3/uL — ABNORMAL HIGH (ref 1.7–7.7)
Neutrophils Relative %: 73 %
Platelets: 282 10*3/uL (ref 150–400)
RBC: 5.03 MIL/uL (ref 4.22–5.81)
RDW: 13.6 % (ref 11.5–15.5)
WBC: 14.3 10*3/uL — ABNORMAL HIGH (ref 4.0–10.5)
nRBC: 0 % (ref 0.0–0.2)

## 2022-07-31 LAB — COMPREHENSIVE METABOLIC PANEL
ALT: 11 U/L (ref 0–44)
AST: 11 U/L — ABNORMAL LOW (ref 15–41)
Albumin: 3.7 g/dL (ref 3.5–5.0)
Alkaline Phosphatase: 85 U/L (ref 38–126)
Anion gap: 8 (ref 5–15)
BUN: 11 mg/dL (ref 6–20)
CO2: 25 mmol/L (ref 22–32)
Calcium: 9.2 mg/dL (ref 8.9–10.3)
Chloride: 103 mmol/L (ref 98–111)
Creatinine, Ser: 1.07 mg/dL (ref 0.61–1.24)
GFR, Estimated: >60 mL/min (ref >60)
Glucose, Bld: 167 mg/dL — ABNORMAL HIGH (ref 70–99)
Potassium: 3.5 mmol/L (ref 3.5–5.1)
Sodium: 136 mmol/L (ref 135–145)
Total Bilirubin: 0.9 mg/dL (ref 0.3–1.2)
Total Protein: 6.8 g/dL (ref 6.5–8.1)

## 2022-07-31 LAB — URINALYSIS, ROUTINE W REFLEX MICROSCOPIC
Bacteria, UA: NONE SEEN
Bilirubin Urine: NEGATIVE
Glucose, UA: >=500 mg/dL — AB
Ketones, ur: NEGATIVE mg/dL
Leukocytes,Ua: NEGATIVE
Nitrite: NEGATIVE
Protein, ur: NEGATIVE mg/dL
Specific Gravity, Urine: 1.028 (ref 1.005–1.030)
pH: 5 (ref 5.0–8.0)

## 2022-07-31 LAB — LIPASE, BLOOD: Lipase: 53 U/L — ABNORMAL HIGH (ref 11–51)

## 2022-07-31 MED ORDER — ONDANSETRON HCL 4 MG PO TABS
4.0000 mg | ORAL_TABLET | Freq: Four times a day (QID) | ORAL | Status: DC | PRN
  Administered 2022-08-03 – 2022-08-04 (×3): 4 mg via ORAL
  Filled 2022-07-31 (×3): qty 1

## 2022-07-31 MED ORDER — ACETAMINOPHEN 650 MG RE SUPP
650.0000 mg | Freq: Three times a day (TID) | RECTAL | Status: DC
  Filled 2022-07-31 (×6): qty 1

## 2022-07-31 MED ORDER — GABAPENTIN 300 MG PO CAPS
300.0000 mg | ORAL_CAPSULE | Freq: Three times a day (TID) | ORAL | Status: DC
  Administered 2022-08-01: 300 mg via ORAL
  Filled 2022-07-31 (×7): qty 1

## 2022-07-31 MED ORDER — KETOROLAC TROMETHAMINE 30 MG/ML IJ SOLN
30.0000 mg | Freq: Four times a day (QID) | INTRAMUSCULAR | Status: DC | PRN
  Administered 2022-08-01 – 2022-08-02 (×4): 30 mg via INTRAVENOUS
  Filled 2022-07-31 (×5): qty 1

## 2022-07-31 MED ORDER — ACETAMINOPHEN 500 MG PO TABS
1000.0000 mg | ORAL_TABLET | Freq: Three times a day (TID) | ORAL | Status: DC
  Administered 2022-08-01 – 2022-08-04 (×10): 1000 mg via ORAL
  Filled 2022-07-31 (×11): qty 2

## 2022-07-31 MED ORDER — OXYCODONE HCL 5 MG PO TABS
5.0000 mg | ORAL_TABLET | Freq: Four times a day (QID) | ORAL | Status: DC | PRN
  Administered 2022-08-01: 5 mg via ORAL
  Filled 2022-07-31 (×2): qty 1

## 2022-07-31 MED ORDER — HYDROMORPHONE HCL 1 MG/ML IJ SOLN: 1.0000 mg | INTRAMUSCULAR | Status: DC | PRN

## 2022-07-31 MED ORDER — INSULIN GLARGINE-YFGN 100 UNIT/ML ~~LOC~~ SOLN
12.0000 [IU] | Freq: Every day | SUBCUTANEOUS | Status: DC
  Administered 2022-08-01 – 2022-08-03 (×3): 12 [IU] via SUBCUTANEOUS
  Filled 2022-07-31 (×5): qty 0.12

## 2022-07-31 MED ORDER — ONDANSETRON 4 MG PO TBDP
4.0000 mg | ORAL_TABLET | Freq: Once | ORAL | Status: AC
  Administered 2022-07-31: 4 mg via ORAL
  Filled 2022-07-31: qty 1

## 2022-07-31 MED ORDER — HYDROXYZINE HCL 25 MG PO TABS
25.0000 mg | ORAL_TABLET | Freq: Three times a day (TID) | ORAL | Status: DC | PRN
  Filled 2022-07-31 (×2): qty 1

## 2022-07-31 MED ORDER — ONDANSETRON HCL 4 MG/2ML IJ SOLN
4.0000 mg | Freq: Once | INTRAMUSCULAR | Status: AC
  Administered 2022-07-31: 4 mg via INTRAVENOUS
  Filled 2022-07-31: qty 2

## 2022-07-31 MED ORDER — PIPERACILLIN-TAZOBACTAM 3.375 G IVPB
3.3750 g | Freq: Four times a day (QID) | INTRAVENOUS | Status: DC
Start: 2022-08-01 — End: 2022-07-31

## 2022-07-31 MED ORDER — ATORVASTATIN CALCIUM 80 MG PO TABS
80.0000 mg | ORAL_TABLET | Freq: Every day | ORAL | Status: DC
  Administered 2022-08-01 – 2022-08-04 (×4): 80 mg via ORAL
  Filled 2022-07-31 (×5): qty 1

## 2022-07-31 MED ORDER — ACETAMINOPHEN 650 MG RE SUPP: 650.0000 mg | Freq: Four times a day (QID) | RECTAL | Status: DC | PRN

## 2022-07-31 MED ORDER — IOHEXOL 350 MG/ML SOLN
100.0000 mL | Freq: Once | INTRAVENOUS | Status: AC | PRN
  Administered 2022-07-31: 100 mL via INTRAVENOUS

## 2022-07-31 MED ORDER — ENOXAPARIN SODIUM 60 MG/0.6ML IJ SOSY
60.0000 mg | PREFILLED_SYRINGE | INTRAMUSCULAR | Status: DC
  Administered 2022-08-01 – 2022-08-04 (×4): 60 mg via SUBCUTANEOUS
  Filled 2022-07-31 (×4): qty 0.6

## 2022-07-31 MED ORDER — HYDROMORPHONE HCL 1 MG/ML IJ SOLN: 0.5000 mg | INTRAMUSCULAR | Status: DC | PRN

## 2022-07-31 MED ORDER — OXYCODONE HCL 5 MG PO TABS: 5.0000 mg | ORAL_TABLET | Freq: Four times a day (QID) | ORAL | Status: DC | PRN

## 2022-07-31 MED ORDER — FENOFIBRATE 54 MG PO TABS
54.0000 mg | ORAL_TABLET | Freq: Every day | ORAL | Status: DC
  Administered 2022-08-01 – 2022-08-04 (×4): 54 mg via ORAL
  Filled 2022-07-31 (×4): qty 1

## 2022-07-31 MED ORDER — SODIUM CHLORIDE 0.9 % IV BOLUS
1000.0000 mL | Freq: Once | INTRAVENOUS | Status: AC
  Administered 2022-07-31: 1000 mL via INTRAVENOUS

## 2022-07-31 MED ORDER — INSULIN ASPART 100 UNIT/ML IJ SOLN
0.0000 [IU] | Freq: Three times a day (TID) | INTRAMUSCULAR | Status: DC
  Administered 2022-08-03 (×2): 2 [IU] via SUBCUTANEOUS

## 2022-07-31 MED ORDER — EMPAGLIFLOZIN 10 MG PO TABS: 10.0000 mg | ORAL_TABLET | Freq: Every day | ORAL | Status: DC

## 2022-07-31 MED ORDER — ONDANSETRON HCL 4 MG/2ML IJ SOLN
4.0000 mg | Freq: Four times a day (QID) | INTRAMUSCULAR | Status: DC | PRN
  Administered 2022-08-02 – 2022-08-03 (×2): 4 mg via INTRAVENOUS
  Filled 2022-07-31 (×2): qty 2

## 2022-07-31 MED ORDER — HYDROMORPHONE HCL 1 MG/ML IJ SOLN
1.0000 mg | Freq: Once | INTRAMUSCULAR | Status: AC
  Administered 2022-07-31: 1 mg via INTRAVENOUS
  Filled 2022-07-31: qty 1

## 2022-07-31 MED ORDER — PIPERACILLIN-TAZOBACTAM 3.375 G IVPB
3.3750 g | Freq: Three times a day (TID) | INTRAVENOUS | Status: DC
  Administered 2022-08-01 – 2022-08-03 (×7): 3.375 g via INTRAVENOUS
  Filled 2022-07-31 (×7): qty 50

## 2022-07-31 MED ORDER — INSULIN GLARGINE-YFGN 100 UNIT/ML ~~LOC~~ SOLN: 10.0000 [IU] | Freq: Every day | SUBCUTANEOUS | Status: DC

## 2022-07-31 MED ORDER — ACETAMINOPHEN 325 MG PO TABS: 650.0000 mg | ORAL_TABLET | Freq: Four times a day (QID) | ORAL | Status: DC | PRN

## 2022-07-31 MED ORDER — OXYCODONE-ACETAMINOPHEN 5-325 MG PO TABS
1.0000 | ORAL_TABLET | Freq: Once | ORAL | Status: AC
  Administered 2022-07-31: 1 via ORAL
  Filled 2022-07-31: qty 1

## 2022-07-31 MED ORDER — HYDROMORPHONE HCL 1 MG/ML IJ SOLN
1.0000 mg | INTRAMUSCULAR | Status: DC | PRN
  Administered 2022-08-02 – 2022-08-03 (×5): 1 mg via INTRAVENOUS
  Filled 2022-07-31 (×5): qty 1

## 2022-07-31 MED ORDER — PIPERACILLIN-TAZOBACTAM 3.375 G IVPB 30 MIN
3.3750 g | Freq: Once | INTRAVENOUS | Status: AC
  Administered 2022-07-31: 3.375 g via INTRAVENOUS
  Filled 2022-07-31: qty 50

## 2022-07-31 MED ORDER — LACTATED RINGERS IV BOLUS
1000.0000 mL | Freq: Once | INTRAVENOUS | Status: AC
  Administered 2022-08-01: 1000 mL via INTRAVENOUS

## 2022-07-31 NOTE — ED Provider Notes (Signed)
[facility] EMERGENCY DEPARTMENT Provider Note   CSN: [id number] Arrival date & time: 07/31/22  1438     History  Chief Complaint  Patient presents with   Abdominal Pain    Logan Lucas is a 46 y.o. male.  HPI 46 year old male presents with lower abdominal pain.  Started 2 days ago.  Is primarily there all the time but then worsens at random times and feels like a twisting.  It is left lower quadrant.  He had a couple episodes of vomiting and some diarrhea.  He is not sure if there was blood in the stool though notes it was dark.  He has had some chills but no obvious fever.  No urinary symptoms.  He was given pain medicine and nausea medicine in the triage area but states that the pain is coming back.  Home Medications Prior to Admission medications   Medication Sig Start Date End Date Taking? Authorizing Provider  Accu-Chek Softclix Lancets lancets Check your blood sugar before eating each morning and before each meal. 05/25/22   Farrel Gordon, DO  acetaminophen (TYLENOL) 500 MG tablet Take 500 mg by mouth every 6 (six) hours as needed for moderate pain.    [provider]  atorvastatin (LIPITOR) 80 MG tablet Take 1 tablet (80 mg total) by mouth daily. 06/21/22   Riesa Pope, MD  blood glucose meter kit and supplies KIT Check your blood sugar before eating each morning and before each meal. 05/25/22   Farrel Gordon, DO  celecoxib (CELEBREX) 200 MG capsule Take 1 capsule (200 mg total) by mouth 2 (two) times daily. 07/11/22   Harris, Vernie Shanks, PA-C  empagliflozin (JARDIANCE) 10 MG TABS tablet Take 1 tablet (10 mg total) by mouth daily. 06/21/22   Riesa Pope, MD  famotidine (PEPCID) 20 MG tablet Take 1 tablet (20 mg total) by mouth daily. 05/28/22   Lacinda Axon, MD  fenofibrate 54 MG tablet Take 1 tablet (54 mg total) by mouth daily. 06/21/22   Katsadouros, Vasilios, MD  gabapentin (NEURONTIN) 300 MG capsule Take 1 capsule (300 mg total)  by mouth 3 (three) times daily. 05/27/22   Lacinda Axon, MD  glucose blood test strip Check your blood sugar before eating each morning and before each meal. 05/25/22   Farrel Gordon, DO  hydrOXYzine (ATARAX) 25 MG tablet Take 1 tablet (25 mg total) by mouth 3 (three) times daily as needed. 06/05/22   Gaylan Gerold, DO  ibuprofen (ADVIL) 200 MG tablet Take 200 mg by mouth every 6 (six) hours as needed for mild pain.    [provider]  insulin aspart (NOVOLOG) 100 UNIT/ML FlexPen Inject 10 Units into the skin 3 (three) times daily with meals. 06/05/22   Gaylan Gerold, DO  insulin glargine (LANTUS) 100 UNIT/ML Solostar Pen Inject 25 Units into the skin daily. 06/05/22   Gaylan Gerold, DO  Insulin Pen Needle (PENTIPS) 31G X 8 MM MISC Check your blood sugar before eating each morning and before each meal. 06/21/22   Gaylan Gerold, DO  losartan (COZAAR) 25 MG tablet Take 1 tablet (25 mg total) by mouth daily. 01/26/21 01/26/22  Wouk, Ailene Rud, MD  melatonin 3 MG TABS tablet Take 1 tablet (3 mg total) by mouth at bedtime as needed. 05/27/22   Lacinda Axon, MD  metFORMIN (GLUCOPHAGE-XR) 500 MG 24 hr tablet Take 1 tablet (500 mg total) by mouth 2 (two) times daily. 06/05/22 09/03/22  Gaylan Gerold, DO  Allergies    Patient has no known allergies.    Review of Systems   Review of Systems  Constitutional:  Positive for chills. Negative for fever.  Gastrointestinal:  Positive for abdominal pain, diarrhea and vomiting. Negative for blood in stool.  Genitourinary:  Negative for dysuria.  Musculoskeletal:  Negative for back pain.    Physical Exam Updated Vital Signs BP 118/74   Pulse 87   Temp 99.3 F (37.4 C) (Oral)   Resp 18   Ht 5' 9" (1.753 m)   Wt 122 kg   SpO2 98%   BMI 39.72 kg/m  Physical Exam Vitals and nursing note reviewed.  Constitutional:      Appearance: He is well-developed. He is not ill-appearing or diaphoretic.  HENT:     Head: Normocephalic and atraumatic.   Cardiovascular:     Rate and Rhythm: Normal rate and regular rhythm.     Heart sounds: Normal heart sounds.  Pulmonary:     Effort: Pulmonary effort is normal.     Breath sounds: Normal breath sounds.  Abdominal:     Palpations: Abdomen is soft.     Tenderness: There is abdominal tenderness in the suprapubic area and left lower quadrant.  Skin:    General: Skin is warm and dry.  Neurological:     Mental Status: He is alert.     ED Results / Procedures / Treatments   Labs (all labs ordered are listed, but only abnormal results are displayed) Labs Reviewed  CBC WITH DIFFERENTIAL/PLATELET - Abnormal; Notable for the following components:      Result Value   WBC 14.3 (*)    Neutro Abs 10.4 (*)    Monocytes Absolute 1.4 (*)    All other components within normal limits  COMPREHENSIVE METABOLIC PANEL - Abnormal; Notable for the following components:   Glucose, Bld 167 (*)    AST 11 (*)    All other components within normal limits  LIPASE, BLOOD - Abnormal; Notable for the following components:   Lipase 53 (*)    All other components within normal limits  URINALYSIS, ROUTINE W REFLEX MICROSCOPIC - Abnormal; Notable for the following components:   Glucose, UA >=500 (*)    Hgb urine dipstick SMALL (*)    All other components within normal limits    EKG None  Radiology CT ABDOMEN PELVIS W CONTRAST  Result Date: 07/31/2022 CLINICAL DATA:  Left lower quadrant abdominal pain. EXAM: CT ABDOMEN AND PELVIS WITH CONTRAST TECHNIQUE: Multidetector CT imaging of the abdomen and pelvis was performed using the standard protocol following bolus administration of intravenous contrast. RADIATION DOSE REDUCTION: This exam was performed according to the departmental dose-optimization program which includes automated exposure control, adjustment of the mA and/or kV according to patient size and/or use of iterative reconstruction technique. CONTRAST:  158m OMNIPAQUE IOHEXOL 350 MG/ML SOLN  COMPARISON:  CT abdomen pelvis dated 07/11/2022. FINDINGS: Lower chest: The visualized lung bases are clear. No intra-abdominal free air or free fluid. Hepatobiliary: Fatty liver. No biliary dilatation. The gallbladder is unremarkable. Pancreas: Unremarkable. No pancreatic ductal dilatation or surrounding inflammatory changes. Spleen: Normal in size without focal abnormality. Adrenals/Urinary Tract: Indeterminate bilateral adrenal nodules similar to prior CT. There is a 2 cm left renal upper pole cyst. There is no hydronephrosis on either side. The visualized ureters and urinary bladder appear unremarkable. Stomach/Bowel: There is sigmoid diverticulosis. There is active inflammatory changes centered at a sigmoid diverticula consistent with acute diverticulitis. No diverticular abscess or perforation.  Inflammatory fluid extends along the left pelvic sidewall into the pelvis. There is no bowel obstruction. The appendix is normal. Vascular/Lymphatic: Mild atherosclerotic calcification of the aorta. The IVC is unremarkable. No portal venous gas. There is no adenopathy. Reproductive: The prostate and seminal vesicles are grossly unremarkable. No pelvic mass. Other: None Musculoskeletal: No acute or significant osseous findings. IMPRESSION: 1. Acute sigmoid diverticulitis. No diverticular abscess or perforation. 2. Fatty liver. 3. Aortic Atherosclerosis (ICD10-I70.0). Electronically Signed   By: Anner Crete M.D.   On: 07/31/2022 21:55    Procedures Procedures    Medications Ordered in ED Medications  piperacillin-tazobactam (ZOSYN) IVPB 3.375 g (3.375 g Intravenous New Bag/Given 07/31/22 2215)  oxyCODONE-acetaminophen (PERCOCET/ROXICET) 5-325 MG per tablet 1 tablet (1 tablet Oral Given 07/31/22 1527)  ondansetron (ZOFRAN-ODT) disintegrating tablet 4 mg (4 mg Oral Given 07/31/22 1527)  HYDROmorphone (DILAUDID) injection 1 mg (1 mg Intravenous Given 07/31/22 2158)  ondansetron (ZOFRAN) injection 4 mg (4 mg  Intravenous Given 07/31/22 2158)  sodium chloride 0.9 % bolus 1,000 mL (1,000 mLs Intravenous New Bag/Given 07/31/22 2202)  iohexol (OMNIPAQUE) 350 MG/ML injection 100 mL (100 mLs Intravenous Contrast Given 07/31/22 2129)    ED Course/ Medical Decision Making/ A&P                           Medical Decision Making Risk Prescription drug management. Decision regarding hospitalization.   Labs show mild hyperglycemia and leukocytosis of 14. CT images viewed/interpreted by myself - shows diverticulitis without abscess. Has a fair amount of inflammation however. He is reporting poor pain control. Thus will admit to internal medicine teaching service for pain control, IV antibiotics. He has a history of diabetes but glucose is only mildly elevated.         Final Clinical Impression(s) / ED Diagnoses Final diagnoses:  None    Rx / DC Orders ED Discharge Orders     None         Sherwood Gambler, MD 07/31/22 2245

## 2022-07-31 NOTE — ED Triage Notes (Signed)
Pt endorses LLQ abd pain since Sunday that radiates to his groin. Endorses n/v/d.

## 2022-07-31 NOTE — ED Provider Triage Note (Signed)
Emergency Medicine Provider Triage Evaluation Note  Logan Lucas , a 46 y.o. male  was evaluated in triage.  Pt complains of left lower quadrant abdominal pain for the past 3 days.  History of diverticulitis.  Patient reports associated nausea, vomiting and diarrhea.  No fevers or chills.  No urinary symptoms.  Is taken no medications for symptoms prior to arrival.  Patient reports red blood in his stool.  Review of Systems  Positive: Left lower quadrant abdominal pain with associated nausea, vomiting, diarrhea Negative: Fever, chills, urinary symptoms  Physical Exam  BP (!) 147/80   Pulse (!) 101   Temp 99.1 F (37.3 C) (Oral)   Resp 18   Ht '5\' 9"'$  (1.753 m)   Wt 122 kg   SpO2 97%   BMI 39.72 kg/m  Gen:   Awake, no distress   Resp:  Normal effort  MSK:   Moves extremities without difficulty  Other:  Focal left lower quadrant pain with guarding.  No rebound or peritonitis.  Bowel sounds are present.  Heart regular rate and rhythm.  Lungs clear to auscultation bilaterally.  Medical Decision Making  Medically screening exam initiated at 3:20 PM.  Appropriate orders placed.  Logan Lucas was informed that the remainder of the evaluation will be completed by another provider, this initial triage assessment does not replace that evaluation, and the importance of remaining in the ED until their evaluation is complete.  Patient presents for left lower quadrant abdominal pain.  Vital signs are reassuring.  Patient is tender to palpation left lower quadrant suspect likely diverticulitis given history of same.  Will need lab work and imaging which is indicated.  These are pending at this time.  Patient provided pain medication and antiemetics.   Doristine Devoid, PA-C 07/31/22 1523

## 2022-07-31 NOTE — H&P (Signed)
Date: 07/31/2022               Patient Name:  Logan Lucas MRN: [id number]  DOB: November 08, 1976 Age / Sex: 46 y.o., male   PCP: Patient, No Pcp Per         Medical Service: Internal Medicine Teaching Service         Attending Physician: Dr. Lottie Mussel, MD    First Contact: {RESIDENTNAMES2023:19197::"Jonathan White, Sharon, DO","Emily Marlou Sa, DO","Greylon Elliot Gurney, MD","Ghalib Humphrey Rolls, MD","Katie Masters, Tito Dine, [place], [place], MD","Amar Posey Pronto, DO","Michael Markus Jarvis DO","Saad Nooruddin, MD","Areeg Florence, DO","Prosper Coy Saunas, [place], MD","Vasili Katsadouros, DO","Jessica Lisabeth Devoid, [place] Demarus Latterell, MD","Tavien Mapp, MD","Tyler Stann Mainland, MD","Sri Jodell Cipro, MD"}      Pager: {RESIDENTNAMES2023:19197::"[phone]","RA [phone]","IR [phone]","OF [phone]","[phone]","KM [phone]","SA [phone]","JG [phone]","MM [phone]","[id number]","MZ [phone]","SN [phone]","NT [phone]","PA [phone]","PB [phone]","VK [phone]","JL [phone]","QN [phone]","[id number]","TM [phone]","[id number]","SS [phone]"}      Second Contact: {RESIDENTNAMES2023:19197::"Jonathan White, MD","Rayann Tish Frederickson, DO","Greylon Elliot Gurney, MD","Ghalib Humphrey Rolls, MD","Katie Masters, Tito Dine, MD","Joseph Charlett Nose, MD","Amar Posey Pronto, DO","Michael Sophronia Simas Nooruddin, MD","Areeg [place], DO","Prosper Coy Saunas, [place], MD","Vasili Katsadouros, DO","Jessica Lisabeth Devoid, MD","Quan Parthenia Ames, MD","Tavien Mapp, MD","Tyler Stann Mainland, MD","Sri Jodell Cipro, MD"}      Pager: {RESIDENTNAMES2023:19197::"[phone]","RA [phone]","ED [phone]","GG [phone]","GK [phone]","KM [phone]","SA [phone]","JG [phone]","MM [phone]","[id number]","MZ [phone]","SN [phone]","AR [phone]","PA [phone]","PB [phone]","VK [phone]","JL [phone]","QN [phone]","[id number]","TM [phone]","[id number]","SS [phone]"}            After Hours (After 5p/  First Contact Pager: [phone]  weekends / holidays): Second Contact Pager: [phone]   SUBJECTIVE   Chief Complaint: ***  History of Present Illness: ***  ED Course: Upon arrival to the ED, patient's vital signs were normal. Physical exam demonstrated abdominal tenderness in suprapubic area and LLQ. Laboratory testing notable for Gluc 167, WBC 14.3, and lipase 53. Abdominal CT scan revealed acute uncomplicated sigmoid diverticulitis without evidence of abscess, perforation, or fistula.   Meds:  No outpatient medications have been marked as taking for the 07/31/22 encounter [facility] Encounter).    Past Medical History  Past Surgical History:  Procedure Laterality Date   LEFT HEART CATH AND CORONARY ANGIOGRAPHY N/A 01/25/2021   Procedure: LEFT HEART CATH AND CORONARY ANGIOGRAPHY;  Surgeon: Nelva Bush, MD;  Location: [place] [facility];  Service: Cardiovascular;  Laterality: N/A;   none      Social:  Lives With: Occupation: Support: Level of Function: PCP: Substances:  Family History: ***  Allergies: Allergies as of 07/31/2022   (No Known Allergies)    Review of Systems: A complete ROS was negative except as per HPI.   OBJECTIVE:   Physical Exam: Blood pressure 118/74, pulse 87, temperature 99.3 F (37.4 C), temperature source Oral, resp. rate 18, height '5\' 9"'$  (1.753 m), weight 122 kg, SpO2 98 %.   General:   awake and alert, lying comfortably in bed, cooperative, not in acute distress Skin:   warm and dry, intact without any obvious lesions or scars, no rashes or lesions  Head:   normocephalic and atraumatic, oral mucosa moist with good dentition, no lymphadenopathy Eyes:   extraocular movements intact, conjunctivae pink, pupils round and reactive to light, no periorbital swelling or scleral icterus Ears:   pinnae normal, no discharge or external lesions  Nose:   symmetrical and without mucosal inflammation, no external lesions  or discharge Lungs:   normal respiratory effort, breathing unlabored, symmetrical chest rise, no crackles or wheezing Cardiac:   regular rate and rhythm, normal S1 and S2, capillary refill 2-3 seconds, no pitting edema Abdomen:   soft and non-distended, normoactive bowel sounds present in all four  quadrants, no guarding or palpable masses Musculoskeletal:   full range of motion in joints, motor strength 5 /5 in all four extremities, no obvious deformities or joint tenderness Neurologic:   oriented to person-place-time, moving all extremities, sensation to light touch and pinprick sensation intact, no facial droop Psychiatric:   mood and affect normal, intelligible speech   Labs: CBC    Component Value Date/Time   WBC 14.3 (H) 07/31/2022 1532   RBC 5.03 07/31/2022 1532   HGB 13.4 07/31/2022 1532   HGB 15.3 07/25/2014 1105   HCT 42.1 07/31/2022 1532   HCT 47.4 07/25/2014 1105   PLT 282 07/31/2022 1532   PLT 256 07/25/2014 1105   MCV 83.7 07/31/2022 1532   MCV 87 07/25/2014 1105   MCH 26.6 07/31/2022 1532   MCHC 31.8 07/31/2022 1532   RDW 13.6 07/31/2022 1532   RDW 15.2 (H) 07/25/2014 1105   LYMPHSABS 2.3 07/31/2022 1532   LYMPHSABS 2.1 07/25/2014 1105   MONOABS 1.4 (H) 07/31/2022 1532   MONOABS 0.9 07/25/2014 1105   EOSABS 0.1 07/31/2022 1532   EOSABS 0.2 07/25/2014 1105   BASOSABS 0.0 07/31/2022 1532   BASOSABS 0.1 07/25/2014 1105     CMP     Component Value Date/Time   NA 136 07/31/2022 1532   NA 143 07/25/2014 1105   K 3.5 07/31/2022 1532   K 4.0 07/25/2014 1105   CL 103 07/31/2022 1532   CL 108 (H) 07/25/2014 1105   CO2 25 07/31/2022 1532   CO2 23 07/25/2014 1105   GLUCOSE 167 (H) 07/31/2022 1532   GLUCOSE 76 07/25/2014 1105   BUN 11 07/31/2022 1532   BUN 8 07/25/2014 1105   CREATININE 1.07 07/31/2022 1532   CREATININE 1.26 07/25/2014 1105   CALCIUM 9.2 07/31/2022 1532   CALCIUM 8.8 07/25/2014 1105   PROT 6.8 07/31/2022 1532   PROT 7.7 02/10/2014 0521    ALBUMIN 3.7 07/31/2022 1532   ALBUMIN 4.2 02/10/2014 0521   AST 11 (L) 07/31/2022 1532   AST 31 02/10/2014 0521   ALT 11 07/31/2022 1532   ALT 35 02/10/2014 0521   ALKPHOS 85 07/31/2022 1532   ALKPHOS 112 02/10/2014 0521   BILITOT 0.9 07/31/2022 1532   BILITOT 0.3 02/10/2014 0521   GFRNONAA >60 07/31/2022 1532   GFRNONAA >60 07/25/2014 1105   GFRAA >60 08/11/2020 0115   GFRAA >60 07/25/2014 1105    Imaging:    EKG: None   ASSESSMENT & PLAN:   Assessment & Plan by Problem: Principal Problem:   Acute diverticulitis Active Problems:   Uncontrolled type 2 diabetes mellitus with hyperglycemia ([place])   Hypertriglyceridemia   Anxiety   Logan Lucas is a 46 y.o. person living with a history of diverticulosis, diabetes, hypertriglyceridemia, and anxiety who presented with *** and admitted for acute sigmoid diverticulitis on hospital day 0   #Acute uncomplicated sigmoid diverticulitis #Diverticulosis Patient presented to [facility] in 04-2020 with worsening abdominal pain where CT revealed several left colonic diverticula. During hospitalization in 07-2020, he was given Augmentin for suspected diverticulitis. Upon arrival to the [facility] ED on 9-5, laboratory testing was notable for leukocytosis with neutrophilic predominance and slightly elevated lipase. Abdominal CT scan revealed acute uncomplicated sigmoid diverticulitis--no abscess, perforation, or fistula noted. Given mildly elevated lipase, pancreatitis was considered, however, CT was negative for pancreatic inflammatory changes and ductal dilation. Similarly, appendix and gallbladder both appeared normal on CT. Patient admitted to IMTS primarily for pain control. > Lactated ringer bolus > Intravenous  piperacillin-tazobactam q6 > NPO for bowel rest, advance diet as tolerated > Acetaminophen '1000mg'$  q8 for mild, IV ketorolac '30mg'$  IV q6 PRN for moderate, oxycodone '5mg'$  q6 PRN for severe, Dilaudid '1mg'$  q3 PRN for breakthrough pain   #Type  II diabetes Patient's A1C in 01-2021 was 7.1 and >15.5 in 04-2022. After his hospitalization for severe symptomatic hyperglycemia without evidence of DKA in 04-2022, he was prescribed insulin glargine 25U daily, insulin aspart 10U q8 with meals, empagliflozin '10mg'$  daily, and metformin '500mg'$  q12 in the outpatient setting. MicroAlb-Cr ratio was 4 in 7-11 in the clinic. Gabapentin??? Adherence??? > Glargine insulin 12U daily, given NPO will start half of home dose > Moderate sliding scale insulin > Trend CBGs   #Hypertriglyceridemia #Hypercholesterolemia Patient has a history of elevated lipids dating back to 2019. He currently takes fenofibrate and atorvastatin. Adherence??? In 05-2022, lipid panel notable for total cholesterol 298, HDL 27, and triglycerides 1400. Most recent triglyceride level was 707. > Atorvastatin '80mg'$  q24 and fenofibrate '54mg'$  q24   #Anxiety Patient has psychiatric history of mild anxiety dating back to 2017 per electronic medical record. He manages this at home with hydroxyzine '25mg'$  as needed. > Hydroxyzine '25mg'$  q8 PRN     Diet: {NAMES:3044014::"Normal","Heart Healthy","Carb-Modified","Renal","Carb/Renal","NPO","TPN","Tube Feeds"} VTE: {NAMES:3044014::"Heparin","Enoxaparin","SCDs","NOAC","None"} IVF: {NAMES:3044014::"None","NS","1/2 NS","LR","D5","D10"},{NAMES:3044014::"None","10cc/hr","25cc/hr","50cc/hr","75cc/hr","100cc/hr","110cc/hr","125cc/hr","Bolus"} Code: {NAMES:3044014::"Full","DNR","DNI","DNR/DNI","Comfort Care","Unknown"}  Prior to Admission Living Arrangement: {NAMES:3044014::"Home, living ***","SNF, ***","Homeless","***"} Anticipated Discharge Location: {NAMES:3044014::"Home","SNF","CIR","***"} Barriers to Discharge: ***  Dispo: Admit patient to {STATUS:3044014::"Observation with expected length of stay less than 2 midnights.","Inpatient with expected length of stay greater than 2 midnights."}  Signed: Serita Butcher, MD Internal Medicine Resident  PGY-1  07/31/2022, 11:38 PM

## 2022-07-31 NOTE — Hospital Course (Addendum)
Logan Lucas is a 46 y.o. with a pertinent PMH of diverticulosis, type 2 diabetes, hypertriglyceridemia, anxiety who presented with concerns of abdominal pain, nausea, and vomiting.  Patient is admitted for further work-up and treatment of acute sigmoid diverticulitis.   #Acute uncomplicated sigmoid diverticulitis -3-day history of nausea, vomiting, and loose stools with left lower quadrant abdominal pain with possible dark stools -History of diverticulitis back in September 2021 for suspected diverticulitis and was treated with oral Augmentin -Initial vital signs were stable with no fevers, tachycardia, or tachypnea noted -Given history of diverticulosis and clinical symptoms, CT abdomen obtained -07/31/2022 CT abdomen showing acute sigmoid diverticulitis with no diverticular abscess or perforation present -Initial labs showing elevated white blood cell count at 14.3, mildly elevated lipase of 57 -07/31/22 hemoglobin stable at 13.4 -Patient initially made NPO,  started on Zosyn, and started on acetaminophen, ketorolac, oxycodone, and Dilaudid for breakthrough pain -Patient continued to get better, and hemoglobin continued to be stable.  White count trended down during hospitalization -Pain was managed with Dilaudid, Toradol, and Tylenol -Patient was on Zosyn for 3 days, and was clinically improving and was transitioned to Cipro and Flagyl.  Patient completed course in hospital and will not be discharged on any antibiotics. 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 in hospital, patient was on 12 units long-acting insulin with sliding scale and home meds  were held -Patient glucose ranged from 89-118 during hospital course -Patient to be discharged on home regimen of empagliflozin 10 mg daily, home metformin 500 mg twice daily, home glargine 25 units daily, and home NovoLog 10 units with meals   #Hypertriglyceridemia #Hypercholesterolemia -Patient has a longstanding history of hypertriglyceridemia and cholesteremia -Repeat triglycerides during hospital was 110 -Most recent lipid panel was on 05/26/2022 showing elevated cholesterol of 298, HDL 27 -Patient was continued on home meds of atorvastatin 80 mg daily and fenofibrate 54 mg daily -Patient to be discharged on home atorvastatin 80 mg daily and fenofibrate 54 mg daily    #Anxiety -Patient has a history of anxiety -Patient had no concerns for anxiety during hospitalization -Patient received home regimen of hydroxyzine 25 mg every 8 hours as needed during hospital stay  #Nicotine dependence -Patient reports smoking half a pack per day for last 25 years  -While in the hospital, patient was given nicotine patch to use during his stay -Patient had no concerns with this during hospital stay

## 2022-08-01 ENCOUNTER — Encounter (HOSPITAL_COMMUNITY): Payer: Self-pay | Admitting: Student

## 2022-08-01 ENCOUNTER — Other Ambulatory Visit: Payer: Self-pay

## 2022-08-01 DIAGNOSIS — E1140 Type 2 diabetes mellitus with diabetic neuropathy, unspecified: Secondary | ICD-10-CM

## 2022-08-01 LAB — CBC WITH DIFFERENTIAL/PLATELET
Abs Immature Granulocytes: 0.05 10*3/uL (ref 0.00–0.07)
Basophils Absolute: 0 10*3/uL (ref 0.0–0.1)
Basophils Relative: 0 %
Eosinophils Absolute: 0.2 10*3/uL (ref 0.0–0.5)
Eosinophils Relative: 2 %
HCT: 36.8 % — ABNORMAL LOW (ref 39.0–52.0)
Hemoglobin: 11.6 g/dL — ABNORMAL LOW (ref 13.0–17.0)
Immature Granulocytes: 1 %
Lymphocytes Relative: 19 %
Lymphs Abs: 1.9 10*3/uL (ref 0.7–4.0)
MCH: 26.5 pg (ref 26.0–34.0)
MCHC: 31.5 g/dL (ref 30.0–36.0)
MCV: 84.2 fL (ref 80.0–100.0)
Monocytes Absolute: 1 10*3/uL (ref 0.1–1.0)
Monocytes Relative: 11 %
Neutro Abs: 6.6 10*3/uL (ref 1.7–7.7)
Neutrophils Relative %: 67 %
Platelets: 225 10*3/uL (ref 150–400)
RBC: 4.37 MIL/uL (ref 4.22–5.81)
RDW: 13.4 % (ref 11.5–15.5)
WBC: 9.8 10*3/uL (ref 4.0–10.5)
nRBC: 0 % (ref 0.0–0.2)

## 2022-08-01 LAB — BASIC METABOLIC PANEL
Anion gap: 5 (ref 5–15)
BUN: 10 mg/dL (ref 6–20)
CO2: 22 mmol/L (ref 22–32)
Calcium: 7.7 mg/dL — ABNORMAL LOW (ref 8.9–10.3)
Chloride: 111 mmol/L (ref 98–111)
Creatinine, Ser: 1.04 mg/dL (ref 0.61–1.24)
GFR, Estimated: >60 mL/min (ref >60)
Glucose, Bld: 111 mg/dL — ABNORMAL HIGH (ref 70–99)
Potassium: 3.8 mmol/L (ref 3.5–5.1)
Sodium: 138 mmol/L (ref 135–145)

## 2022-08-01 LAB — GLUCOSE, CAPILLARY
Glucose-Capillary: 97 mg/dL (ref 70–99)
Glucose-Capillary: 118 mg/dL — ABNORMAL HIGH (ref 70–99)
Glucose-Capillary: 89 mg/dL (ref 70–99)

## 2022-08-01 LAB — CBG MONITORING, ED
Glucose-Capillary: 116 mg/dL — ABNORMAL HIGH (ref 70–99)
Glucose-Capillary: 143 mg/dL — ABNORMAL HIGH (ref 70–99)

## 2022-08-01 MED ORDER — HYDROMORPHONE HCL 2 MG PO TABS
2.0000 mg | ORAL_TABLET | Freq: Four times a day (QID) | ORAL | Status: DC | PRN
  Administered 2022-08-01 – 2022-08-04 (×6): 2 mg via ORAL
  Filled 2022-08-01 (×7): qty 1

## 2022-08-01 MED ORDER — NICOTINE 14 MG/24HR TD PT24
14.0000 mg | MEDICATED_PATCH | Freq: Every day | TRANSDERMAL | Status: DC
  Administered 2022-08-01 – 2022-08-04 (×4): 14 mg via TRANSDERMAL
  Filled 2022-08-01 (×4): qty 1

## 2022-08-01 MED ORDER — HYDROMORPHONE HCL 2 MG PO TABS
2.0000 mg | ORAL_TABLET | Freq: Once | ORAL | Status: AC
  Administered 2022-08-01: 2 mg via ORAL
  Filled 2022-08-01: qty 1

## 2022-08-01 NOTE — ED Notes (Signed)
Clear liquid tray ordered 

## 2022-08-01 NOTE — ED Notes (Signed)
MD at bedside. 

## 2022-08-01 NOTE — ED Notes (Signed)
PT reports he now has pain after eating clear liquid diet.

## 2022-08-01 NOTE — ED Notes (Addendum)
Secure chat sent to attending requesting nicotine patch and po dilaudid  Angelyn Punt

## 2022-08-01 NOTE — Progress Notes (Signed)
HD#0 Subjective:   Summary: This is a 46 year old male with a pertinent PMH of diverticulosis, type 2 diabetes, hypertriglyceridemia, anxiety who presented with concerns of abdominal pain, nausea, and vomiting.  Patient is admitted for further work-up and treatment of acute sigmoid diverticulitis.  Overnight Events: No overnight events  Patient reports that he still having left lower quadrant abdominal pain.  He states moving makes it worse.  He states that he still does not feel like eating currently.  He denies any bowel movements.  Denies any chest pain, shortness of breath, fever, or chills.  He denies any rigors.  Patient reports that he will request a diet, when he feels like he is ready.  He states that the oxycodone gave him, made him very itchy, but he denies any rash or hives.  Patient reports that he has had a GI bleed in the past, but denies any history of EGD or colonoscopy.  He denies any recent use of ibuprofen, naproxen, Goody powder, or BC powder.  Objective:  Vital signs in last 24 hours: Vitals:   08/01/22 0551 08/01/22 0958 08/01/22 1004 08/01/22 1412  BP: 122/62 111/74  123/73  Pulse: 77 74  71  Resp: '18 18  18  '$ Temp: 99 F (37.2 C)  98 F (36.7 C) 97.9 F (36.6 C)  TempSrc:   Oral Oral  SpO2: 97% 97%  97%  Weight:      Height:       Supplemental O2: Room Air SpO2: 97 %   Physical Exam:  Constitutional: Well-appearing, resting in bed in no acute distress HENT: normocephalic atraumatic Cardiovascular: regular rate and rhythm, no m/r/g Pulmonary/Chest: normal work of breathing on room air, lungs clear to auscultation bilaterally Abdominal: Soft, mildly distended, with normoactive bowel sounds.  Tenderness noted to epigastric region, and left lower quadrant.  No rebound or guarding. MSK: normal bulk and tone Psych: Normal mood and affect Filed Weights   07/31/22 1445  Weight: 122 kg     Intake/Output Summary (Last 24 hours) at 08/01/2022 1440 Last  data filed at 07/31/2022 2359 Gross per 24 hour  Intake 257.86 ml  Output --  Net 257.86 ml   Net IO Since Admission: 257.86 mL [08/01/22 1440]  Pertinent Labs:    Latest Ref Rng & Units 08/01/2022    5:34 AM 07/31/2022    3:32 PM 07/10/2022    4:47 PM  CBC  WBC 4.0 - 10.5 K/uL 9.8  14.3  11.2   Hemoglobin 13.0 - 17.0 g/dL 11.6  13.4  13.7   Hematocrit 39.0 - 52.0 % 36.8  42.1  44.7   Platelets 150 - 400 K/uL 225  282  293        Latest Ref Rng & Units 08/01/2022    5:34 AM 07/31/2022    3:32 PM 07/10/2022    4:47 PM  CMP  Glucose 70 - 99 mg/dL 111  167  110   BUN 6 - 20 mg/dL '10  11  12   '$ Creatinine 0.61 - 1.24 mg/dL 1.04  1.07  1.02   Sodium 135 - 145 mmol/L 138  136  140   Potassium 3.5 - 5.1 mmol/L 3.8  3.5  4.1   Chloride 98 - 111 mmol/L 111  103  108   CO2 22 - 32 mmol/L '22  25  23   '$ Calcium 8.9 - 10.3 mg/dL 7.7  9.2  9.5   Total Protein 6.5 - 8.1 g/dL  6.8  7.4   Total Bilirubin 0.3 - 1.2 mg/dL  0.9  0.3   Alkaline Phos 38 - 126 U/L  85  95   AST 15 - 41 U/L  11  16   ALT 0 - 44 U/L  11  17     Imaging: CT ABDOMEN PELVIS [place] CONTRAST  Result Date: 07/31/2022 CLINICAL DATA:  Left lower quadrant abdominal pain. EXAM: CT ABDOMEN AND PELVIS WITH CONTRAST TECHNIQUE: Multidetector CT imaging of the abdomen and pelvis was performed using the standard protocol following bolus administration of intravenous contrast. RADIATION DOSE REDUCTION: This exam was performed according to the departmental dose-optimization program which includes automated exposure control, adjustment of the mA and/or kV according to patient size and/or use of iterative reconstruction technique. CONTRAST:  139m OMNIPAQUE IOHEXOL 350 MG/ML SOLN COMPARISON:  CT abdomen pelvis dated 07/11/2022. FINDINGS: Lower chest: The visualized lung bases are clear. No intra-abdominal free air or free fluid. Hepatobiliary: Fatty liver. No biliary dilatation. The gallbladder is unremarkable. Pancreas: Unremarkable. No pancreatic  ductal dilatation or surrounding inflammatory changes. Spleen: Normal in size without focal abnormality. Adrenals/Urinary Tract: Indeterminate bilateral adrenal nodules similar to prior CT. There is a 2 cm left renal upper pole cyst. There is no hydronephrosis on either side. The visualized ureters and urinary bladder appear unremarkable. Stomach/Bowel: There is sigmoid diverticulosis. There is active inflammatory changes centered at a sigmoid diverticula consistent with acute diverticulitis. No diverticular abscess or perforation. Inflammatory fluid extends along the left pelvic sidewall into the pelvis. There is no bowel obstruction. The appendix is normal. Vascular/Lymphatic: Mild atherosclerotic calcification of the aorta. The IVC is unremarkable. No portal venous gas. There is no adenopathy. Reproductive: The prostate and seminal vesicles are grossly unremarkable. No pelvic mass. Other: None Musculoskeletal: No acute or significant osseous findings. IMPRESSION: 1. Acute sigmoid diverticulitis. No diverticular abscess or perforation. 2. Fatty liver. 3. Aortic Atherosclerosis (ICD10-I70.0). Electronically Signed   By: AAnner CreteM.D.   On: 07/31/2022 21:55    Assessment/Plan:   Principal Problem:   Acute diverticulitis Active Problems:   Uncontrolled type 2 diabetes mellitus [place] hyperglycemia [place]   Hypertriglyceridemia   Anxiety   Patient Summary: [name] a 46y.o. with a pertinent PMH of diverticulosis, type 2 diabetes, hypertriglyceridemia, anxiety who presented with concerns of abdominal pain, nausea, and vomiting.  Patient is admitted for further work-up and treatment of acute sigmoid diverticulitis.  #Acute uncomplicated sigmoid diverticulitis -3-day history of nausea, vomiting, and loose stools with left lower quadrant abdominal pain -Patient also reported having dark stools -Consider upper GI bleed, small bowel obstruction, gastroenteritis, gastritis, enterocolitis,  diverticulitis, pancreatitis  -History of diverticulitis back in September 2021 for suspected diverticulitis and was treated with oral Augmentin -Initial vital signs were stable with no fevers, tachycardia, or tachypnea noted -Given history of diverticulosis and clinical symptoms, CT abdomen obtained -07/31/2022 CT abdomen showing acute sigmoid diverticulitis with no diverticular abscess or perforation present, -Initial labs showing elevated white blood cell count at 14.3, mildly elevated lipase of 57 -07/31/22 hemoglobin stable at 13.4 -Patient initially made NPO,  started on Zosyn, and started on acetaminophen, ketorolac, oxycodone, and Dilaudid for breakthrough pain -On exam today, patient had left lower quadrant tenderness as well as epigastric tenderness, and patient had reaction to oxycodone that caused pruritus -Hemoglobin trending down to 11.6 today, will monitor -White count trending down to 9.8 today -Stop oxycodone, continue Dilaudid 2 mg every 6 hours as needed, continue Toradol 30 mg every 6  hours as needed, continue Tylenol 1000 mg every 8 hours -Continue Zofran 4 mg every 6 hours as needed -Zosyn day 2 -Increase diet as tolerated -Triglycerides pending -Continue to monitor CBC, and trend temperature  #Type II diabetes mellitus #Neuropathy, 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 -Patient has a past medical history of diabetes -Patient was on home glargine 25 units daily, NovoLog 10 units with meals, empagliflozin 10 mg daily, and metformin 500 mg twice daily -Most recent A1c 01/2022 was >15.5  -Patient had recent hospitalization with symptomatic hyperglycemia without DKA on 04/2022 -Initial glucose on presentation on CMP 167 -Given patient was n.p.o., 12 units long-acting insulin with sliding scale started, holding all other home meds -Hold long-acting if glucose less than 100 -As patient increases his diet, home meds will be restarted -Continue 12 units long-acting insulin  at bedtime  -Continue to monitor blood sugars, if less than 100, hold long-acting insulin at bedtime -Hold all other home meds -A1c pending   #Hypertriglyceridemia #Hypercholesterolemia -Patient has a history of elevated lipids dating back to 2019 -Most recent lipid panel on 05/26/2022 showing elevated cholesterol of 298, HDL 27, triglycerides 1400 -Repeat triglyceride on 05/27/2022 was 707 -Patient on home atorvastatin 80 mg daily and fenofibrate 54 mg daily -Continue home meds -Triglycerides pending   #Anxiety -Patient has a history of anxiety -Patient not concerned for anxiety today -Continue home regimen of hydroxyzine 25 mg every 8 hours as needed  #Nicotine dependence -Patient reports smoking half a pack per day for last 25 years  -Continue nicotine patch   Diet: Clear liquid IVF: N/A VTE: Enoxaparin Code: Full  Dispo: Anticipated discharge to Home in 2 days pending clinical improvement.   [facility] Resident PGY-1 [phone] Please contact the on call pager after 5 pm and on weekends at [phone].

## 2022-08-01 NOTE — ED Notes (Signed)
Pt requesting clear liquids.

## 2022-08-02 LAB — CBC WITH DIFFERENTIAL/PLATELET
Abs Immature Granulocytes: 0.02 10*3/uL (ref 0.00–0.07)
Basophils Absolute: 0 10*3/uL (ref 0.0–0.1)
Basophils Relative: 0 %
Eosinophils Absolute: 0.3 10*3/uL (ref 0.0–0.5)
Eosinophils Relative: 4 %
HCT: 41.4 % (ref 39.0–52.0)
Hemoglobin: 13.1 g/dL (ref 13.0–17.0)
Immature Granulocytes: 0 %
Lymphocytes Relative: 19 %
Lymphs Abs: 1.4 10*3/uL (ref 0.7–4.0)
MCH: 26.5 pg (ref 26.0–34.0)
MCHC: 31.6 g/dL (ref 30.0–36.0)
MCV: 83.6 fL (ref 80.0–100.0)
Monocytes Absolute: 0.5 10*3/uL (ref 0.1–1.0)
Monocytes Relative: 7 %
Neutro Abs: 5 10*3/uL (ref 1.7–7.7)
Neutrophils Relative %: 70 %
Platelets: 267 10*3/uL (ref 150–400)
RBC: 4.95 MIL/uL (ref 4.22–5.81)
RDW: 13.3 % (ref 11.5–15.5)
WBC: 7.2 10*3/uL (ref 4.0–10.5)
nRBC: 0 % (ref 0.0–0.2)

## 2022-08-02 LAB — BASIC METABOLIC PANEL
Anion gap: 8 (ref 5–15)
BUN: 10 mg/dL (ref 6–20)
CO2: 24 mmol/L (ref 22–32)
Calcium: 8.6 mg/dL — ABNORMAL LOW (ref 8.9–10.3)
Chloride: 103 mmol/L (ref 98–111)
Creatinine, Ser: 1.04 mg/dL (ref 0.61–1.24)
GFR, Estimated: >60 mL/min (ref >60)
Glucose, Bld: 107 mg/dL — ABNORMAL HIGH (ref 70–99)
Potassium: 3.5 mmol/L (ref 3.5–5.1)
Sodium: 135 mmol/L (ref 135–145)

## 2022-08-02 LAB — GLUCOSE, CAPILLARY
Glucose-Capillary: 117 mg/dL — ABNORMAL HIGH (ref 70–99)
Glucose-Capillary: 104 mg/dL — ABNORMAL HIGH (ref 70–99)
Glucose-Capillary: 108 mg/dL — ABNORMAL HIGH (ref 70–99)

## 2022-08-02 LAB — HEMOGLOBIN A1C
Hgb A1c MFr Bld: 9 % — ABNORMAL HIGH (ref 4.8–5.6)
Mean Plasma Glucose: 211.6 mg/dL

## 2022-08-02 LAB — TRIGLYCERIDES: Triglycerides: 110 mg/dL (ref <150)

## 2022-08-02 NOTE — Progress Notes (Signed)
  Progress Note   Date: 08/02/2022  Patient Name: Logan Lucas        MRN#: [id number]  Review the patient's clinical findings supports the diagnosis of:   Obesity

## 2022-08-02 NOTE — Progress Notes (Addendum)
HD#1 Subjective:   Summary: This is a 46 year old male with a pertinent PMH of diverticulosis, type 2 diabetes, hypertriglyceridemia, anxiety who presented with concerns of abdominal pain, nausea, and vomiting.  Patient is admitted for further work-up and treatment of acute sigmoid diverticulitis.  Overnight Events: No overnight events  Patient is resting comfortably in bed upon my exam.  He states that he has been able to tolerate liquids today with no concerns.  He states that last night, he tried doing some clear liquids, and still had some mild pain.  He was eating some clear liquids upon my exam.  He states that he will try to order some soup today, stating that he did not want chicken broth for breakfast.  He denies any nausea, vomiting, or diarrhea.  He does report feeling better and that the Dilaudid helps him.  He denies any fevers or chills.  Objective:  Vital signs in last 24 hours: Vitals:   08/01/22 1940 08/01/22 2334 08/02/22 0537 08/02/22 0740  BP: 114/76 114/73 116/68 132/87  Pulse: 66 66 73 68  Resp: '15 15 18 16  '$ Temp: (!) 97.5 F (36.4 C) 97.7 F (36.5 C) 98.1 F (36.7 C) 98.6 F (37 C)  TempSrc: Oral Oral Oral Oral  SpO2: 96% 97% 100% 100%  Weight:      Height:       Supplemental O2: Room Air SpO2: 100 %   Physical Exam:  Constitutional: Well-appearing, resting in bed in no acute distress HENT: normocephalic atraumatic Cardiovascular: regular rate and rhythm, no m/r/g Pulmonary/Chest: normal work of breathing on room air, lungs clear to auscultation bilaterally Abdominal: Soft, mildly distended, with normoactive bowel sounds.  Tenderness noted to left lower quadrant with no rebound or guarding MSK: normal bulk and tone Psych: Normal mood and affect Filed Weights   07/31/22 1445  Weight: 122 kg     Intake/Output Summary (Last 24 hours) at 08/02/2022 1451 Last data filed at 08/02/2022 0857 Gross per 24 hour  Intake 740 ml  Output 1500 ml  Net -760  ml   Net IO Since Admission: -502.14 mL [08/02/22 1451]  Pertinent Labs:    Latest Ref Rng & Units 08/02/2022    5:02 AM 08/01/2022    5:34 AM 07/31/2022    3:32 PM  CBC  WBC 4.0 - 10.5 K/uL 7.2  9.8  14.3   Hemoglobin 13.0 - 17.0 g/dL 13.1  11.6  13.4   Hematocrit 39.0 - 52.0 % 41.4  36.8  42.1   Platelets 150 - 400 K/uL 267  225  282        Latest Ref Rng & Units 08/02/2022    5:02 AM 08/01/2022    5:34 AM 07/31/2022    3:32 PM  CMP  Glucose 70 - 99 mg/dL 107  111  167   BUN 6 - 20 mg/dL '10  10  11   '$ Creatinine 0.61 - 1.24 mg/dL 1.04  1.04  1.07   Sodium 135 - 145 mmol/L 135  138  136   Potassium 3.5 - 5.1 mmol/L 3.5  3.8  3.5   Chloride 98 - 111 mmol/L 103  111  103   CO2 22 - 32 mmol/L '24  22  25   '$ Calcium 8.9 - 10.3 mg/dL 8.6  7.7  9.2   Total Protein 6.5 - 8.1 g/dL   6.8   Total Bilirubin 0.3 - 1.2 mg/dL   0.9   Alkaline Phos 38 - 126  U/L   85   AST 15 - 41 U/L   11   ALT 0 - 44 U/L   11     Imaging: No results found.  Assessment/Plan:   Principal Problem:   Acute diverticulitis Active Problems:   Uncontrolled type 2 diabetes mellitus with hyperglycemia ([place])   Hypertriglyceridemia   Anxiety   Patient Summary: Logan Lucas is a 46 y.o. with a pertinent PMH of diverticulosis, type 2 diabetes, hypertriglyceridemia, anxiety who presented with concerns of abdominal pain, nausea, and vomiting.  Patient is admitted for further work-up and treatment of acute sigmoid diverticulitis.  #Acute uncomplicated sigmoid diverticulitis -3-day history of nausea, vomiting, and loose stools with left lower quadrant abdominal pain -Patient also reported having dark stools -Consider upper GI bleed, small bowel obstruction, gastroenteritis, gastritis, enterocolitis, diverticulitis, pancreatitis  -History of diverticulitis back in September 2021 for suspected diverticulitis and was treated with oral Augmentin -Initial vital signs were stable with no fevers, tachycardia, or  tachypnea noted -Given history of diverticulosis and clinical symptoms, CT abdomen obtained -07/31/2022 CT abdomen showing acute sigmoid diverticulitis with no diverticular abscess or perforation present -Initial labs showing elevated white blood cell count at 14.3, mildly elevated lipase of 57 -07/31/22 hemoglobin stable at 13.4 -Patient initially made NPO,  started on Zosyn, and started on acetaminophen, ketorolac, oxycodone, and Dilaudid for breakthrough pain -On exam today, patient has mild left lower quadrant tenderness, improved from yesterday.  No epigastric tenderness today.  -Hemoglobin at 13.1 today. -White count trending down to 7.2 today -Continue pain regimen with Dilaudid 2 mg every 6 hours as needed, continue Toradol 30 mg every 6 hours as needed, continue Tylenol 1000 mg every 8 hours -Zosyn day 3, consider changing to oral antibiotics as soon as patient can tolerate p.o. 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 -Patient has a past medical history of diabetes -Patient was on home glargine 25 units daily, NovoLog 10 units with meals, empagliflozin 10 mg daily, and metformin 500 mg twice daily -Most recent A1c 01/2022 was >15.5  -Patient had recent hospitalization with symptomatic hyperglycemia without DKA on 04/2022 -Initial glucose on presentation on CMP 167 -Given patient was n.p.o., 12 units long-acting insulin with sliding scale started, holding all other home meds -Glucose ranging from 89-116 -08/02/22 Repeat A1c 9.0 -Continue 12 units long-acting insulin at bedtime with sliding scale, hold if glucose less than 100 -Continue to monitor blood glucose levels -Continue to hold home empagliflozin 10 mg daily, and metformin 500 mg twice daily    #Hypertriglyceridemia #Hypercholesterolemia -Patient has a history of elevated lipids dating back to 2019 -Most recent lipid panel on 05/26/2022 showing elevated cholesterol of 298, HDL 27, triglycerides 1400 -Repeat triglyceride on 05/27/2022 was 707 -08/02/2022 triglycerides 110 -Patient on home atorvastatin 80 mg daily and fenofibrate 54 mg daily -Continue home meds during hospitalization   #Anxiety -Patient has a history of anxiety -Patient not concerned for anxiety today -Continue home regimen of hydroxyzine 25 mg every 8 hours as needed  #Nicotine dependence -Patient reports smoking half pack a day for the past 25 years -Continue nicotine patch   Diet: Regular diet, advance as tolerated IVF: N/A VTE: Enoxaparin Code: Full  Dispo: Anticipated discharge to Home in 1 days pending clinical improvement.   [facility] Resident PGY-1  [phone] Please contact the on call pager after 5 pm and on weekends at [phone].

## 2022-08-03 ENCOUNTER — Other Ambulatory Visit (HOSPITAL_COMMUNITY): Payer: Self-pay

## 2022-08-03 LAB — BASIC METABOLIC PANEL
Anion gap: 10 (ref 5–15)
BUN: 9 mg/dL (ref 6–20)
CO2: 22 mmol/L (ref 22–32)
Calcium: 9.1 mg/dL (ref 8.9–10.3)
Chloride: 106 mmol/L (ref 98–111)
Creatinine, Ser: 0.96 mg/dL (ref 0.61–1.24)
GFR, Estimated: >60 mL/min (ref >60)
Glucose, Bld: 113 mg/dL — ABNORMAL HIGH (ref 70–99)
Potassium: 4.4 mmol/L (ref 3.5–5.1)
Sodium: 138 mmol/L (ref 135–145)

## 2022-08-03 LAB — GLUCOSE, CAPILLARY
Glucose-Capillary: 91 mg/dL (ref 70–99)
Glucose-Capillary: 124 mg/dL — ABNORMAL HIGH (ref 70–99)
Glucose-Capillary: 90 mg/dL (ref 70–99)
Glucose-Capillary: 132 mg/dL — ABNORMAL HIGH (ref 70–99)
Glucose-Capillary: 99 mg/dL (ref 70–99)

## 2022-08-03 MED ORDER — METRONIDAZOLE 500 MG PO TABS
500.0000 mg | ORAL_TABLET | Freq: Three times a day (TID) | ORAL | Status: DC
  Administered 2022-08-03 – 2022-08-04 (×4): 500 mg via ORAL
  Filled 2022-08-03 (×4): qty 1

## 2022-08-03 MED ORDER — NICOTINE 14 MG/24HR TD PT24
14.0000 mg | MEDICATED_PATCH | Freq: Every day | TRANSDERMAL | 0 refills | Status: DC
  Filled 2022-08-03: qty 28, 28d supply, fill #0

## 2022-08-03 MED ORDER — ONDANSETRON 4 MG PO TBDP
4.0000 mg | ORAL_TABLET | Freq: Three times a day (TID) | ORAL | 0 refills | Status: DC | PRN
  Filled 2022-08-03: qty 20, 7d supply, fill #0

## 2022-08-03 MED ORDER — HYDROMORPHONE HCL 2 MG PO TABS
2.0000 mg | ORAL_TABLET | Freq: Four times a day (QID) | ORAL | 0 refills | Status: DC | PRN
  Filled 2022-08-03: qty 12, 3d supply, fill #0

## 2022-08-03 MED ORDER — CIPROFLOXACIN HCL 500 MG PO TABS
500.0000 mg | ORAL_TABLET | Freq: Two times a day (BID) | ORAL | Status: DC
  Administered 2022-08-03 – 2022-08-04 (×3): 500 mg via ORAL
  Filled 2022-08-03 (×7): qty 1

## 2022-08-03 NOTE — Progress Notes (Signed)
Mobility Specialist Progress Note:   08/03/22 1109  Mobility  Activity Refused mobility   Pt refused mobility d/t feeling lethargic and not sleeping well. Will f/u as able.    Logan Lucas Mobility Specialist-Acute Rehab Secure Chat only

## 2022-08-03 NOTE — Progress Notes (Signed)
HD#2 Subjective:   Summary: This is a 46 year old male with a pertinent PMH of diverticulosis, type 2 diabetes, hypertriglyceridemia, anxiety who presented with concerns of abdominal pain, nausea, and vomiting.  Patient is admitted for further work-up and treatment of acute sigmoid diverticulitis.  Overnight Events: No overnight events  Patient is resting comfortably in bed upon my exam.  He states that he has been able to tolerate liquids today with no concerns.  He states that he feels that he is not ready yet to go. He thinks that 1 more day would be beneficial to him. He tried some cream of chicken soup today and it caused him to be Nauseous. He denies any fever or chills. He still does report some nausea but denies any vomiting. He also reports having some left lower quadrant abdominal pain.   Objective:  Vital signs in last 24 hours: Vitals:   08/02/22 0537 08/02/22 0740 08/02/22 1955 08/03/22 0700  BP: 116/68 132/87 125/82 130/78  Pulse: 73 68 71 87  Resp: '18 16 16 16  '$ Temp: 98.1 F (36.7 C) 98.6 F (37 C) 98.3 F (36.8 C) 98.9 F (37.2 C)  TempSrc: Oral Oral Oral Oral  SpO2: 100% 100% 99% 98%  Weight:      Height:       Supplemental O2: Room Air SpO2: 98 %   Physical Exam:  Constitutional: Well-appearing, resting in bed in no acute distress HENT: normocephalic atraumatic Cardiovascular: regular rate and rhythm, no m/r/g Pulmonary/Chest: normal work of breathing on room air, lungs clear to auscultation bilaterally Abdominal: Soft, non-distended with tenderness noted to left lower quadrant with no rebound or guarding MSK: normal bulk and tone Psych: Normal mood and affect Filed Weights   07/31/22 1445  Weight: 122 kg     Intake/Output Summary (Last 24 hours) at 08/03/2022 1438 Last data filed at 08/03/2022 1214 Gross per 24 hour  Intake 417 ml  Output 400 ml  Net 17 ml   Net IO Since Admission: -485.14 mL [08/03/22 1438]  Pertinent Labs:    Latest Ref  Rng & Units 08/02/2022    5:02 AM 08/01/2022    5:34 AM 07/31/2022    3:32 PM  CBC  WBC 4.0 - 10.5 K/uL 7.2  9.8  14.3   Hemoglobin 13.0 - 17.0 g/dL 13.1  11.6  13.4   Hematocrit 39.0 - 52.0 % 41.4  36.8  42.1   Platelets 150 - 400 K/uL 267  225  282        Latest Ref Rng & Units 08/03/2022    7:06 AM 08/02/2022    5:02 AM 08/01/2022    5:34 AM  CMP  Glucose 70 - 99 mg/dL 113  107  111   BUN 6 - 20 mg/dL '9  10  10   '$ Creatinine 0.61 - 1.24 mg/dL 0.96  1.04  1.04   Sodium 135 - 145 mmol/L 138  135  138   Potassium 3.5 - 5.1 mmol/L 4.4  3.5  3.8   Chloride 98 - 111 mmol/L 106  103  111   CO2 22 - 32 mmol/L '22  24  22   '$ Calcium 8.9 - 10.3 mg/dL 9.1  8.6  7.7     Imaging: No results found.  Assessment/Plan:   Principal Problem:   Acute diverticulitis Active Problems:   Uncontrolled type 2 diabetes mellitus with hyperglycemia ([place])   Hypertriglyceridemia   Anxiety   Patient Summary: Logan Lucas is a  46 y.o. with a pertinent PMH of diverticulosis, type 2 diabetes, hypertriglyceridemia, anxiety who presented with concerns of abdominal pain, nausea, and vomiting.  Patient is admitted for further work-up and treatment of acute sigmoid diverticulitis.  #Acute uncomplicated sigmoid diverticulitis -3-day history of nausea, vomiting, and loose stools with left lower quadrant abdominal pain -Patient also reported having dark stools -Consider upper GI bleed, small bowel obstruction, gastroenteritis, gastritis, enterocolitis, diverticulitis, pancreatitis  -History of diverticulitis back in September 2021 for suspected diverticulitis and was treated with oral Augmentin -Initial vital signs were stable with no fevers, tachycardia, or tachypnea noted -Given history of diverticulosis and clinical symptoms, CT abdomen obtained -07/31/2022 CT abdomen showing acute sigmoid diverticulitis with no diverticular abscess or perforation present -Initial labs showing elevated white blood cell count at  14.3, mildly elevated lipase of 57 -07/31/22 hemoglobin stable at 13.4 -Patient initially made NPO,  started on Zosyn, and started on acetaminophen, ketorolac, oxycodone, and Dilaudid for breakthrough pain -On exam today, patient has mild left lower quadrant tenderness, improved from yesterday.  No epigastric tenderness today.  -Hemoglobin at 13.1 on 08/02/22 -White count trending down to 7.2 on 08/02/22 -Continue pain regimen with Dilaudid 2 mg P.O -Discontinue zosyn and start ciprofloxacin '500mg'$  q12h and Flagyl Q8h for one more day to total 5 day course. 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 -Patient has a past medical history of diabetes -Patient was on home glargine 25 units daily, NovoLog 10 units with meals, empagliflozin 10 mg daily, and metformin 500 mg twice daily -Most recent A1c 01/2022 was >15.5  -Patient had recent hospitalization with symptomatic hyperglycemia without DKA on 04/2022 -Initial glucose on presentation on CMP 167 -Given patient was n.p.o., 12 units long-acting insulin with sliding scale started, holding all other home meds -Glucose ranging from 91-124 -08/02/22 Repeat A1c 9.0 -Continue 12 units long-acting insulin at bedtime with sliding scale, hold if glucose less than 100 -Continue to monitor blood glucose levels -Continue to hold home empagliflozin 10 mg daily, and metformin 500 mg twice daily -Continue home gabapentin 300 mg TID    #Hypertriglyceridemia #Hypercholesterolemia -Patient has a history of elevated lipids dating back to 2019 -Most recent lipid panel on 05/26/2022 showing elevated cholesterol of 298, HDL 27, triglycerides 1400 -Repeat triglyceride on 05/27/2022 was 707 -08/02/2022 triglycerides 110 -Patient on home atorvastatin 80 mg daily and fenofibrate 54 mg daily -Continue home meds during hospitalization   #Anxiety -Patient has a history of anxiety -Patient not  concerned for anxiety today -Continue home regimen of hydroxyzine 25 mg every 8 hours as needed  #Nicotine dependence -Patient reports smoking half pack a day for the past 25 years -Continue nicotine patch   Diet: Regular diet, advance as tolerated IVF: N/A VTE: Enoxaparin Code: Full  Dispo: Anticipated discharge to Home in 1 days pending clinical improvement.   [facility] Resident PGY-1 [phone] Please contact the on call pager after 5 pm and on weekends at [phone].

## 2022-08-03 NOTE — Discharge Instructions (Addendum)
Logan Lucas It was a pleasure taking care of you at [facility] were admitted for acute diverticulitis and treated with antibiotics as well as bowel rest.  We are discharging you home now that you are doing better. Please follow the following instructions.   1) Regarding your diverticulitis, we have finished a 5-day course of antibiotics in the hospital.  We have given your bowel some rest, and you were able to slowly restart your diet.  Please continue to slowly restart your diet, and increase fiber in your diet.  To help with this, we have written for some pain medicine for you to take outpatient to help with the recovery.  2) Regarding your diabetes, you were stopped on your home regimen during the hospital stay given that you were not able to eat.  On discharge, I want you to resume your home medication regimen with empagliflozin 10 mg daily, home metformin 500 mg twice daily, home glargine 25 units daily, and home NovoLog 10 units with meals.  3) Regarding your elevated cholesterol and triglycerides, please continue taking your home atorvastatin 80 mg daily and fenofibrate 54 mg daily.  4) I have scheduled you with an appointment for hospital follow-up at the internal medicine center with Dr. Linward Natal. This appointment is on 08/14/2022 at 3:15 PM.  Please show up to your appointment.    Take care,  Dr. Leigh Aurora, DO

## 2022-08-03 NOTE — TOC Initial Note (Signed)
Transition of Care [facility]) - Initial/Assessment Note    Patient Details  Name: Logan Lucas MRN: [id number] Date of Birth: 11/05/1976  Transition of Care [facility]) CM/SW Contact:    Curlene Labrum, RN Phone Number: 08/03/2022, 4:26 PM  Clinical Narrative:                 Cm met with the patient at the bedside to discuss transitions of care to home.  The patient currently lives with his sister and walks to his job at [employer].  The patient was provided with 3 bus tickets at the bedside for transportation home from the hospital and to get to follow up appointments.  Discharge medications to be provided through [facility] - locked up in the main pharmacy to be delivered on day of discharge to the patient's hospital room.  CAGE assessment completed since the patient has active use of Marijuana - Substance counseling provided in the discharge instructions.  Patient smokes and patient provided with smoking cessation in discharge instructions.  CM will follow the patient for likely discharge to home - patient given [place] bus passes (3) to get home from hospital and to PCP follow up.  Expected Discharge Plan: Home/Self Care Barriers to Discharge: Continued Medical Work up   Patient Goals and CMS Choice Patient states their goals for this hospitalization and ongoing recovery are:: To get better and go home CMS Medicare.gov Compare Post Acute Care list provided to:: Patient Choice offered to / list presented to : Patient  Expected Discharge Plan and Services Expected Discharge Plan: Home/Self Care   Discharge Planning Services: CM Consult   Living arrangements for the past 2 months: Apartment                                      Prior Living Arrangements/Services Living arrangements for the past 2 months: Apartment Lives with:: Siblings (LIves with sister) Patient language and need for interpreter reviewed:: Yes Do you feel safe going back to the place  where you live?: Yes      Need for Family Participation in Patient Care: Yes (Comment) Care giver support system in place?: Yes (comment)   Criminal Activity/Legal Involvement Pertinent to Current Situation/Hospitalization: No - Comment as needed  Activities of Daily Living Home Assistive Devices/Equipment: None ADL Screening (condition at time of admission) Patient's cognitive ability adequate to safely complete daily activities?: Yes Is the patient deaf or have difficulty hearing?: No Does the patient have difficulty seeing, even when wearing glasses/contacts?: No Does the patient have difficulty concentrating, remembering, or making decisions?: No Patient able to express need for assistance with ADLs?: Yes Does the patient have difficulty dressing or bathing?: No Independently performs ADLs?: Yes (appropriate for developmental age) Does the patient have difficulty walking or climbing stairs?: No Weakness of Legs: None Weakness of Arms/Hands: None  Permission Sought/Granted Permission sought to share information with : Case Manager, Family Supports, PCP Permission granted to share information with : Yes, Verbal Permission Granted     Permission granted to share info w AGENCY: PCP follow up at clinic, [facility] pharmacy for discharge medications  Permission granted to share info w Relationship: sister -     Emotional Assessment Appearance:: Appears stated age Attitude/Demeanor/Rapport: Gracious Affect (typically observed): Accepting Orientation: : Oriented to Self, Oriented to Place, Oriented to  Time, Oriented to Situation Alcohol / Substance Use: Illicit Drugs, Tobacco Use Psych  Involvement: No (comment)  Admission diagnosis:  Acute diverticulitis [K57.92] Patient Active Problem List   Diagnosis Date Noted   Acute diverticulitis 07/31/2022   Hypertriglyceridemia 06/05/2022   Anxiety 06/05/2022   Severe hyperglycemia due to diabetes mellitus ([place]) 05/24/2022   Cocaine abuse  ([place]) 01/25/2021   Uncontrolled type 2 diabetes mellitus with hyperglycemia ([place]) 01/25/2021   Abnormal stress test    Alcohol use 01/23/2021   Rectal bleeding 01/23/2021   Asthma    Hypertension    Tobacco abuse    Duodenitis 10/14/2019   PCP:  Patient, No Pcp Per Pharmacy:   [facility] [address] [place] - [address] [place] [address] Phone: [phone] Fax: [phone]  [facility] [address]), [place] - [place]  [address] Phone: [phone] Fax: [phone]  [id number] - [place], [place] [address] [place] [address] Phone: [phone] Fax: [phone]  [facility] [address] [place] [address] Phone: [phone] Fax: [place] [address] [place] [address] Phone: [phone] Fax: [phone]     Social Determinants of Health (SDOH) Interventions    Readmission Risk Interventions    08/03/2022    4:22 PM  Readmission Risk Prevention Plan  Post Dischage Appt Complete  Medication Screening Complete  Transportation Screening Complete

## 2022-08-03 NOTE — TOC CAGE-AID Note (Signed)
Transition of Care [facility]) - CAGE-AID Screening   Patient Details  Name: Logan Lucas MRN: [id number] Date of Birth: 25-Feb-1976  Transition of Care [facility]) CM/SW Contact:    Curlene Labrum, RN Phone Number: 08/03/2022, 4:22 PM   Clinical Narrative: CAGE completed for current marijuana use and OP counseling placed in discharge instructions.   CAGE-AID Screening:    Have You Ever Felt You Ought to Cut Down on Your Drinking or Drug Use?: No Have People Annoyed You By Critizing Your Drinking Or Drug Use?: No Have You Felt Bad Or Guilty About Your Drinking Or Drug Use?: No Have You Ever Had a Drink or Used Drugs First Thing In The Morning to Steady Your Nerves or to Get Rid of a Hangover?: No CAGE-AID Score: 0     Substance abuse interventions: Patient Counseling, Educational Materials

## 2022-08-04 LAB — GLUCOSE, CAPILLARY
Glucose-Capillary: 112 mg/dL — ABNORMAL HIGH (ref 70–99)
Glucose-Capillary: 111 mg/dL — ABNORMAL HIGH (ref 70–99)
Glucose-Capillary: 112 mg/dL — ABNORMAL HIGH (ref 70–99)

## 2022-08-04 NOTE — Plan of Care (Signed)

## 2022-08-04 NOTE — Discharge Summary (Addendum)
Name: Logan Lucas MRN: [id number] DOB: 12-24-1975 46 y.o. PCP: Patient, No Pcp Per  Date of Admission: 07/31/2022  2:42 PM Date of Discharge: 08/04/2022 Attending Physician: Dr. Daryll Drown  Discharge Diagnosis: Principal Problem:   Acute diverticulitis Active Problems:   Uncontrolled type 2 diabetes mellitus with hyperglycemia ([place])   Hypertriglyceridemia   Anxiety    Discharge Medications: Allergies as of 08/04/2022       Reactions   Oxycodone Itching        Medication List     STOP taking these medications    celecoxib 200 MG capsule Commonly known as: CeleBREX       TAKE these medications    Accu-Chek Guide test strip Generic drug: glucose blood Check your blood sugar before eating each morning and before each meal.   Accu-Chek Guide w/Device Kit Check your blood sugar before eating each morning and before each meal.   Accu-Chek Softclix Lancets lancets Check your blood sugar before eating each morning and before each meal.   atorvastatin 80 MG tablet Commonly known as: LIPITOR Take 1 tablet (80 mg total) by mouth daily.   famotidine 20 MG tablet Commonly known as: PEPCID Take 1 tablet (20 mg total) by mouth daily.   fenofibrate 54 MG tablet Take 1 tablet (54 mg total) by mouth daily.   gabapentin 300 MG capsule Commonly known as: NEURONTIN Take 1 capsule (300 mg total) by mouth 3 (three) times daily. What changed:  when to take this reasons to take this   HYDROmorphone 2 MG tablet Commonly known as: DILAUDID Take 1 tablet (2 mg total) by mouth every 6 (six) hours as needed for severe pain.   hydrOXYzine 25 MG tablet Commonly known as: ATARAX Take 1 tablet (25 mg total) by mouth 3 (three) times daily as needed. What changed: reasons to take this   Jardiance 10 MG Tabs tablet Generic drug: empagliflozin Take 1 tablet (10 mg total) by mouth daily.   Lantus SoloStar 100 UNIT/ML Solostar Pen Generic drug: insulin glargine Inject 25  Units into the skin daily.   losartan 25 MG tablet Commonly known as: Cozaar Take 1 tablet (25 mg total) by mouth daily.   melatonin 3 MG Tabs tablet Take 1 tablet (3 mg total) by mouth at bedtime as needed. What changed: reasons to take this   metFORMIN 500 MG 24 hr tablet Commonly known as: GLUCOPHAGE-XR Take 1 tablet (500 mg total) by mouth 2 (two) times daily.   nicotine 14 mg/24hr patch Commonly known as: NICODERM CQ - dosed in mg/24 hours Place 1 patch (14 mg total) onto the skin daily.   NovoLOG FlexPen 100 UNIT/ML FlexPen Generic drug: insulin aspart Inject 10 Units into the skin 3 (three) times daily with meals.   ondansetron 4 MG disintegrating tablet Commonly known as: ZOFRAN-ODT Take 1 tablet (4 mg total) by mouth every 8 (eight) hours as needed for nausea or vomiting.   Unifine Pentips 31G X 8 MM Misc Generic drug: Insulin Pen Needle Check your blood sugar before eating each morning and before each meal.        Disposition and follow-up:   Mr.Turon V Lantry was discharged from [facility] in Good condition.  At the hospital follow up visit please address:  1.  Follow-up:  a.  Diverticulitis: Patient treated with 5-day course of antibiotics during hospital stay.  Patient discharged on Dilaudid 2 mg every 6 hours for the next 3 days, and Zofran for nausea  4 mg as needed.  Follow-up for resolution of nausea vomiting and abdominal pain.  Follow-up CBC.  Consider referral to GI for colonoscopy in 6 to 8 weeks    b.  Type 2 diabetes mellitus: Patient resumed on home 25 units Lantus, NovoLog 10 units with meals, empagliflozin 10 mg daily, and metformin 500 mg twice daily.  A1c during hospitalization was 9.0.  Continue to follow-up outpatient to manage blood sugars   c.  Hypertriglyceridemia and hypercholesterolemia: Triglycerides in hospital 110.  Patient on home atorvastatin 80 mg daily and fenofibrate 54 mg daily.  Continue to manage  outpatient.  2.  Labs / imaging needed at time of follow-up: CBC, and will likely need colonoscopy in 6-8 weeks   3.  Pending labs/ test needing follow-up: N/A  4.  Medication Changes  1) Patient given home oral Dilaudid 2 mg every 6 hours for the next 3 days  2) patient given home oral Zofran 4 mg as needed  Follow-up Appointments:  Follow-up Information     Linward Natal, MD. Go on 08/14/2022.   Why: Please go to the internal medicine center for your appointment on 08/14/2022 at 3:15 PM for hospital follow-up. Contact information: [place] [address] [place] by problem list: KENNER LEWAN is a 46 y.o. with a pertinent PMH of diverticulosis, type 2 diabetes, hypertriglyceridemia, anxiety who presented with concerns of abdominal pain, nausea, and vomiting.  Patient is admitted for further work-up and treatment of acute sigmoid diverticulitis.   #Acute uncomplicated sigmoid diverticulitis -3-day history of nausea, vomiting, and loose stools with left lower quadrant abdominal pain with possible dark stools -History of diverticulitis back in September 2021 for suspected diverticulitis and was treated with oral Augmentin -Initial vital signs were stable with no fevers, tachycardia, or tachypnea noted -Given history of diverticulosis and clinical symptoms, CT abdomen obtained -07/31/2022 CT abdomen showing acute sigmoid diverticulitis with no diverticular abscess or perforation present -Initial labs showing elevated white blood cell count at 14.3, mildly elevated lipase of 57 -07/31/22 hemoglobin stable at 13.4 -Patient initially made NPO,  started on Zosyn, and started on acetaminophen, ketorolac, oxycodone, and Dilaudid for breakthrough pain -Patient continued to get better, and hemoglobin continued to be stable.  White count trended down during hospitalization -Pain was managed with Dilaudid, Toradol, and  Tylenol -Patient was on Zosyn for 3 days, and was clinically improving and was transitioned to Cipro and Flagyl.  Patient completed course in hospital and will not be discharged on any antibiotics. 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 in hospital, patient was on 12 units long-acting insulin with sliding scale and home meds were held -Patient glucose ranged from 89-118 during hospital course -Patient to be discharged on home regimen of empagliflozin 10 mg daily, home metformin 500 mg twice daily, home glargine 25 units daily, and home NovoLog 10 units with meals   #Hypertriglyceridemia #Hypercholesterolemia -Patient has a longstanding history of hypertriglyceridemia and cholesteremia -Repeat triglycerides during hospital was 110 -Most recent lipid panel was on 05/26/2022 showing elevated cholesterol of 298, HDL 27 -Patient  was continued on home meds of atorvastatin 80 mg daily and fenofibrate 54 mg daily -Patient to be discharged on home atorvastatin 80 mg daily and fenofibrate 54 mg daily    #Anxiety -Patient has a history of anxiety -Patient had no concerns for anxiety during hospitalization -Patient received home regimen of hydroxyzine 25 mg every 8 hours as needed during hospital stay  #Nicotine dependence -Patient reports smoking half a pack per day for last 25 years  -While in the hospital, patient was given nicotine patch to use during his stay -Patient had no concerns with this during hospital stay       Discharge Exam:   BP 128/72 (BP Location: Right Arm)   Pulse 74   Temp 98.4 F (36.9 C) (Oral)   Resp 15   Ht _0  (1.753 m)    Wt 122 kg   SpO2 99%   BMI 39.72 kg/m  Constitutional: Patient resting comfortably in bed upon my exam in no acute distress HENT: normocephalic atraumatic  Eyes: conjunctiva non-erythematous Pulmonary/Chest: normal work of breathing on room air Abdominal: Abdominal exam deferred by patient MSK: normal bulk and tone  Pertinent Labs, Studies, and Procedures:     Latest Ref Rng & Units 08/02/2022    5:02 AM 08/01/2022    5:34 AM 07/31/2022    3:32 PM  CBC  WBC 4.0 - 10.5 K/uL 7.2  9.8  14.3   Hemoglobin 13.0 - 17.0 g/dL 13.1  11.6  13.4   Hematocrit 39.0 - 52.0 % 41.4  36.8  42.1   Platelets 150 - 400 K/uL 267  225  282        Latest Ref Rng & Units 08/03/2022    7:06 AM 08/02/2022    5:02 AM 08/01/2022    5:34 AM  CMP  Glucose 70 - 99 mg/dL 113  107  111   BUN 6 - 20 mg/dL _1 Creatinine 0.61 - 1.24 mg/dL 0.96  1.04  1.04   Sodium 135 - 145 mmol/L 138  135  138   Potassium 3.5 - 5.1 mmol/L 4.4  3.5  3.8   Chloride 98 - 111 mmol/L 106  103  111   CO2 22 - 32 mmol/L _2 Calcium 8.9 - 10.3 mg/dL 9.1  8.6  7.7     CT ABDOMEN PELVIS W CONTRAST  Result Date: 07/31/2022 CLINICAL DATA:  Left lower quadrant abdominal pain. EXAM: CT ABDOMEN AND PELVIS WITH CONTRAST TECHNIQUE: Multidetector CT imaging of the abdomen and pelvis was performed using the standard protocol following bolus administration of intravenous contrast. RADIATION DOSE REDUCTION: This exam was performed according to the departmental dose-optimization program which includes automated exposure control, adjustment of the mA and/or kV according to patient size and/or use of iterative reconstruction technique. CONTRAST:  155m OMNIPAQUE IOHEXOL 350 MG/ML SOLN COMPARISON:  CT abdomen pelvis dated 07/11/2022. FINDINGS: Lower chest: The visualized lung bases are clear. No intra-abdominal free air or free fluid. Hepatobiliary: Fatty liver. No biliary dilatation. The gallbladder is unremarkable. Pancreas: Unremarkable. No  pancreatic ductal dilatation or surrounding inflammatory changes. Spleen: Normal in size without focal abnormality. Adrenals/Urinary Tract: Indeterminate bilateral adrenal nodules similar to prior CT. There is a 2 cm left renal upper pole cyst. There is no hydronephrosis on either side. The visualized ureters and urinary bladder appear unremarkable. Stomach/Bowel: There is sigmoid diverticulosis. There is active inflammatory changes centered at a sigmoid diverticula consistent with  acute diverticulitis. No diverticular abscess or perforation. Inflammatory fluid extends along the left pelvic sidewall into the pelvis. There is no bowel obstruction. The appendix is normal. Vascular/Lymphatic: Mild atherosclerotic calcification of the aorta. The IVC is unremarkable. No portal venous gas. There is no adenopathy. Reproductive: The prostate and seminal vesicles are grossly unremarkable. No pelvic mass. Other: None Musculoskeletal: No acute or significant osseous findings. IMPRESSION: 1. Acute sigmoid diverticulitis. No diverticular abscess or perforation. 2. Fatty liver. 3. Aortic Atherosclerosis (ICD10-I70.0). Electronically Signed   By: Anner Crete M.D.   On: 07/31/2022 21:55     Discharge Instructions:  Payne Garske It was a pleasure taking care of you at [facility] were admitted for acute diverticulitis and treated with antibiotics as well as bowel rest. Please follow the following instructions.   1) Regarding your diverticulitis, we have finished a 5-day course of antibiotics in the hospital.  We have given your bowel some rest, and you were able to slowly restart your diet.  Please continue to slowly restart your diet, and increase fiber in your diet.  To help with this, we have written for some pain medicine for you to take outpatient to help with the recovery.  We have also written you for some nausea medicine for you to take outpatient to help with nausea.  2) Regarding your  diabetes, you were stopped on your home regimen during the hospital stay given that you were not able to eat.  On discharge, I want you to resume your home medication regimen with empagliflozin 10 mg daily, home metformin 500 mg twice daily, home glargine 25 units daily, and home NovoLog 10 units with meals.  3) Regarding your elevated cholesterol and triglycerides, please continue taking your home atorvastatin 80 mg daily and fenofibrate 54 mg daily.  4) I have scheduled you with an appointment for hospital follow-up at the internal medicine center with Dr. Linward Natal. This appointment is on 08/14/2022 at 3:15 PM.  Please show up to your appointment.    Take care,  Dr. Leigh Aurora, DO   Signed: Leigh Aurora, DO 08/04/2022, 4:16 PM   Pager: [phone]

## 2022-09-05 ENCOUNTER — Other Ambulatory Visit (HOSPITAL_COMMUNITY): Payer: Self-pay

## 2022-10-15 ENCOUNTER — Other Ambulatory Visit: Payer: Self-pay | Admitting: Student

## 2022-10-15 ENCOUNTER — Other Ambulatory Visit (HOSPITAL_COMMUNITY): Payer: Self-pay

## 2022-10-15 MED ORDER — EMPAGLIFLOZIN 10 MG PO TABS
10.0000 mg | ORAL_TABLET | Freq: Every day | ORAL | 3 refills | Status: DC
  Filled 2022-10-15: qty 30, 30d supply, fill #0

## 2022-10-15 MED ORDER — FENOFIBRATE 54 MG PO TABS
54.0000 mg | ORAL_TABLET | Freq: Every day | ORAL | 3 refills | Status: DC
  Filled 2022-10-15: qty 30, 30d supply, fill #0

## 2022-10-15 MED ORDER — ATORVASTATIN CALCIUM 80 MG PO TABS
80.0000 mg | ORAL_TABLET | Freq: Every day | ORAL | 3 refills | Status: DC
  Filled 2022-10-15: qty 30, 30d supply, fill #0

## 2022-10-16 ENCOUNTER — Other Ambulatory Visit (HOSPITAL_COMMUNITY): Payer: Self-pay

## 2022-10-23 ENCOUNTER — Other Ambulatory Visit: Payer: Self-pay | Admitting: Student

## 2022-10-23 ENCOUNTER — Other Ambulatory Visit (HOSPITAL_COMMUNITY): Payer: Self-pay

## 2022-10-23 DIAGNOSIS — Z794 Long term (current) use of insulin: Secondary | ICD-10-CM

## 2022-10-23 NOTE — Telephone Encounter (Signed)
Last seen in 07/2022.  Message to [employer] to schedule appointment in the Clinics.

## 2022-10-25 ENCOUNTER — Other Ambulatory Visit (HOSPITAL_COMMUNITY): Payer: Self-pay

## 2022-10-25 MED ORDER — METFORMIN HCL ER 500 MG PO TB24
500.0000 mg | ORAL_TABLET | Freq: Two times a day (BID) | ORAL | 2 refills | Status: DC
  Filled 2022-10-25: qty 60, 30d supply, fill #0

## 2022-11-02 ENCOUNTER — Other Ambulatory Visit (HOSPITAL_COMMUNITY): Payer: Self-pay

## 2023-02-27 ENCOUNTER — Emergency Department (HOSPITAL_COMMUNITY)
Admission: EM | Admit: 2023-02-27 | Discharge: 2023-02-28 | Disposition: A | Discharge status: Home / Self Care | Payer: Self-pay | Attending: Emergency Medicine | Admitting: Emergency Medicine

## 2023-02-27 ENCOUNTER — Encounter (HOSPITAL_COMMUNITY): Payer: Self-pay | Admitting: Emergency Medicine

## 2023-02-27 DIAGNOSIS — R1084 Generalized abdominal pain: Secondary | ICD-10-CM | POA: Insufficient documentation

## 2023-02-27 DIAGNOSIS — R197 Diarrhea, unspecified: Secondary | ICD-10-CM | POA: Insufficient documentation

## 2023-02-27 DIAGNOSIS — D72829 Elevated white blood cell count, unspecified: Secondary | ICD-10-CM | POA: Insufficient documentation

## 2023-02-27 DIAGNOSIS — E871 Hypo-osmolality and hyponatremia: Secondary | ICD-10-CM | POA: Insufficient documentation

## 2023-02-27 DIAGNOSIS — E876 Hypokalemia: Secondary | ICD-10-CM | POA: Insufficient documentation

## 2023-02-27 DIAGNOSIS — R7989 Other specified abnormal findings of blood chemistry: Secondary | ICD-10-CM

## 2023-02-27 DIAGNOSIS — Z794 Long term (current) use of insulin: Secondary | ICD-10-CM | POA: Insufficient documentation

## 2023-02-27 DIAGNOSIS — R112 Nausea with vomiting, unspecified: Secondary | ICD-10-CM | POA: Insufficient documentation

## 2023-02-27 LAB — CBC WITH DIFFERENTIAL/PLATELET
Abs Immature Granulocytes: 0.07 10*3/uL (ref 0.00–0.07)
Basophils Absolute: 0 10*3/uL (ref 0.0–0.1)
Basophils Relative: 0 %
Eosinophils Absolute: 0.3 10*3/uL (ref 0.0–0.5)
Eosinophils Relative: 2 %
HCT: 50.8 % (ref 39.0–52.0)
Hemoglobin: 16.5 g/dL (ref 13.0–17.0)
Immature Granulocytes: 1 %
Lymphocytes Relative: 22 %
Lymphs Abs: 2.9 10*3/uL (ref 0.7–4.0)
MCH: 26.6 pg (ref 26.0–34.0)
MCHC: 32.5 g/dL (ref 30.0–36.0)
MCV: 81.9 fL (ref 80.0–100.0)
Monocytes Absolute: 0.9 10*3/uL (ref 0.1–1.0)
Monocytes Relative: 7 %
Neutro Abs: 9 10*3/uL — ABNORMAL HIGH (ref 1.7–7.7)
Neutrophils Relative %: 68 %
Platelets: 367 10*3/uL (ref 150–400)
RBC: 6.2 MIL/uL — ABNORMAL HIGH (ref 4.22–5.81)
RDW: 14 % (ref 11.5–15.5)
WBC: 13.1 10*3/uL — ABNORMAL HIGH (ref 4.0–10.5)
nRBC: 0 % (ref 0.0–0.2)

## 2023-02-27 LAB — COMPREHENSIVE METABOLIC PANEL
ALT: 37 U/L (ref 0–44)
AST: 22 U/L (ref 15–41)
Albumin: 3.9 g/dL (ref 3.5–5.0)
Alkaline Phosphatase: 118 U/L (ref 38–126)
Anion gap: 10 (ref 5–15)
BUN: 14 mg/dL (ref 6–20)
CO2: 19 mmol/L — ABNORMAL LOW (ref 22–32)
Calcium: 8.8 mg/dL — ABNORMAL LOW (ref 8.9–10.3)
Chloride: 102 mmol/L (ref 98–111)
Creatinine, Ser: 1.52 mg/dL — ABNORMAL HIGH (ref 0.61–1.24)
GFR, Estimated: 58 mL/min — ABNORMAL LOW (ref >60)
Glucose, Bld: 179 mg/dL — ABNORMAL HIGH (ref 70–99)
Potassium: 3.4 mmol/L — ABNORMAL LOW (ref 3.5–5.1)
Sodium: 131 mmol/L — ABNORMAL LOW (ref 135–145)
Total Bilirubin: 0.6 mg/dL (ref 0.3–1.2)
Total Protein: 7.3 g/dL (ref 6.5–8.1)

## 2023-02-27 LAB — LIPASE, BLOOD: Lipase: 39 U/L (ref 11–51)

## 2023-02-27 NOTE — ED Triage Notes (Signed)
Pt reports diarrhea and stomach cramping- worse than when he had last bout of diverticulitus. Pain started last night and worsenig today.

## 2023-02-28 ENCOUNTER — Emergency Department (HOSPITAL_COMMUNITY): Payer: Self-pay

## 2023-02-28 ENCOUNTER — Emergency Department (HOSPITAL_COMMUNITY)
Admission: EM | Admit: 2023-02-28 | Discharge: 2023-03-01 | Disposition: A | Discharge status: Home / Self Care | Payer: Self-pay | Attending: Emergency Medicine | Admitting: Emergency Medicine

## 2023-02-28 ENCOUNTER — Other Ambulatory Visit: Payer: Self-pay

## 2023-02-28 ENCOUNTER — Encounter (HOSPITAL_COMMUNITY): Payer: Self-pay

## 2023-02-28 DIAGNOSIS — R451 Restlessness and agitation: Secondary | ICD-10-CM | POA: Insufficient documentation

## 2023-02-28 DIAGNOSIS — R197 Diarrhea, unspecified: Secondary | ICD-10-CM | POA: Insufficient documentation

## 2023-02-28 DIAGNOSIS — I10 Essential (primary) hypertension: Secondary | ICD-10-CM | POA: Insufficient documentation

## 2023-02-28 DIAGNOSIS — R1084 Generalized abdominal pain: Secondary | ICD-10-CM | POA: Insufficient documentation

## 2023-02-28 DIAGNOSIS — Z794 Long term (current) use of insulin: Secondary | ICD-10-CM | POA: Insufficient documentation

## 2023-02-28 DIAGNOSIS — Z1152 Encounter for screening for COVID-19: Secondary | ICD-10-CM | POA: Insufficient documentation

## 2023-02-28 DIAGNOSIS — J45909 Unspecified asthma, uncomplicated: Secondary | ICD-10-CM | POA: Insufficient documentation

## 2023-02-28 DIAGNOSIS — Z7984 Long term (current) use of oral hypoglycemic drugs: Secondary | ICD-10-CM | POA: Insufficient documentation

## 2023-02-28 DIAGNOSIS — Z79899 Other long term (current) drug therapy: Secondary | ICD-10-CM | POA: Insufficient documentation

## 2023-02-28 DIAGNOSIS — R112 Nausea with vomiting, unspecified: Secondary | ICD-10-CM | POA: Insufficient documentation

## 2023-02-28 LAB — CBC WITH DIFFERENTIAL/PLATELET
Abs Immature Granulocytes: 0.03 10*3/uL (ref 0.00–0.07)
Basophils Absolute: 0 10*3/uL (ref 0.0–0.1)
Basophils Relative: 0 %
Eosinophils Absolute: 0.2 10*3/uL (ref 0.0–0.5)
Eosinophils Relative: 3 %
HCT: 54.2 % — ABNORMAL HIGH (ref 39.0–52.0)
Hemoglobin: 16.4 g/dL (ref 13.0–17.0)
Immature Granulocytes: 0 %
Lymphocytes Relative: 22 %
Lymphs Abs: 1.8 10*3/uL (ref 0.7–4.0)
MCH: 25.9 pg — ABNORMAL LOW (ref 26.0–34.0)
MCHC: 30.3 g/dL (ref 30.0–36.0)
MCV: 85.5 fL (ref 80.0–100.0)
Monocytes Absolute: 0.7 10*3/uL (ref 0.1–1.0)
Monocytes Relative: 9 %
Neutro Abs: 5.3 10*3/uL (ref 1.7–7.7)
Neutrophils Relative %: 66 %
Platelets: 342 10*3/uL (ref 150–400)
RBC: 6.34 MIL/uL — ABNORMAL HIGH (ref 4.22–5.81)
RDW: 14.4 % (ref 11.5–15.5)
WBC: 8.1 10*3/uL (ref 4.0–10.5)
nRBC: 0 % (ref 0.0–0.2)

## 2023-02-28 LAB — RESP PANEL BY RT-PCR (RSV, FLU A&B, COVID)  RVPGX2
Influenza A by PCR: NEGATIVE
Influenza B by PCR: NEGATIVE
Resp Syncytial Virus by PCR: NEGATIVE
SARS Coronavirus 2 by RT PCR: NEGATIVE

## 2023-02-28 LAB — GASTROINTESTINAL PANEL BY PCR, STOOL (REPLACES STOOL CULTURE)
Adenovirus F40/41: NOT DETECTED
Astrovirus: NOT DETECTED
Campylobacter species: NOT DETECTED
Cryptosporidium: NOT DETECTED
Cyclospora cayetanensis: NOT DETECTED
Entamoeba histolytica: NOT DETECTED
Enteroaggregative E coli (EAEC): NOT DETECTED
Enteropathogenic E coli (EPEC): DETECTED — AB
Enterotoxigenic E coli (ETEC): NOT DETECTED
Giardia lamblia: NOT DETECTED
Norovirus GI/GII: NOT DETECTED
Plesimonas shigelloides: NOT DETECTED
Rotavirus A: NOT DETECTED
Salmonella species: NOT DETECTED
Sapovirus (I, II, IV, and V): DETECTED — AB
Shiga like toxin producing E coli (STEC): NOT DETECTED
Shigella/Enteroinvasive E coli (EIEC): NOT DETECTED
Vibrio cholerae: NOT DETECTED
Vibrio species: NOT DETECTED
Yersinia enterocolitica: NOT DETECTED

## 2023-02-28 LAB — COMPREHENSIVE METABOLIC PANEL
ALT: 34 U/L (ref 0–44)
AST: 18 U/L (ref 15–41)
Albumin: 3.7 g/dL (ref 3.5–5.0)
Alkaline Phosphatase: 110 U/L (ref 38–126)
Anion gap: 13 (ref 5–15)
BUN: 14 mg/dL (ref 6–20)
CO2: 15 mmol/L — ABNORMAL LOW (ref 22–32)
Calcium: 8.5 mg/dL — ABNORMAL LOW (ref 8.9–10.3)
Chloride: 106 mmol/L (ref 98–111)
Creatinine, Ser: 1.1 mg/dL (ref 0.61–1.24)
GFR, Estimated: >60 mL/min (ref >60)
Glucose, Bld: 193 mg/dL — ABNORMAL HIGH (ref 70–99)
Potassium: 3.8 mmol/L (ref 3.5–5.1)
Sodium: 134 mmol/L — ABNORMAL LOW (ref 135–145)
Total Bilirubin: 0.7 mg/dL (ref 0.3–1.2)
Total Protein: 6.9 g/dL (ref 6.5–8.1)

## 2023-02-28 LAB — LIPASE, BLOOD: Lipase: 37 U/L (ref 11–51)

## 2023-02-28 LAB — LACTIC ACID, PLASMA: Lactic Acid, Venous: 1.3 mmol/L (ref 0.5–1.9)

## 2023-02-28 MED ORDER — MORPHINE SULFATE (PF) 4 MG/ML IV SOLN
4.0000 mg | Freq: Once | INTRAVENOUS | Status: AC
  Administered 2023-02-28: 4 mg via INTRAVENOUS
  Filled 2023-02-28: qty 1

## 2023-02-28 MED ORDER — LOPERAMIDE HCL 2 MG PO CAPS
4.0000 mg | ORAL_CAPSULE | Freq: Once | ORAL | Status: AC
Start: 2023-02-28 — End: 2023-02-28
  Administered 2023-02-28: 4 mg via ORAL
  Filled 2023-02-28: qty 2

## 2023-02-28 MED ORDER — LOPERAMIDE HCL 2 MG PO CAPS: 2.0000 mg | ORAL_CAPSULE | Freq: Four times a day (QID) | ORAL | 0 refills | Status: DC | PRN

## 2023-02-28 MED ORDER — ONDANSETRON 4 MG PO TBDP: 4.0000 mg | ORAL_TABLET | Freq: Three times a day (TID) | ORAL | 0 refills | Status: DC | PRN

## 2023-02-28 MED ORDER — SODIUM CHLORIDE 0.9 % IV BOLUS
1000.0000 mL | Freq: Once | INTRAVENOUS | Status: AC
  Administered 2023-02-28: 1000 mL via INTRAVENOUS

## 2023-02-28 MED ORDER — ONDANSETRON HCL 4 MG/2ML IJ SOLN
4.0000 mg | Freq: Once | INTRAMUSCULAR | Status: AC
  Administered 2023-02-28: 4 mg via INTRAVENOUS
  Filled 2023-02-28: qty 2

## 2023-02-28 MED ORDER — DICYCLOMINE HCL 10 MG/ML IM SOLN
20.0000 mg | Freq: Once | INTRAMUSCULAR | Status: AC
  Administered 2023-02-28: 20 mg via INTRAMUSCULAR
  Filled 2023-02-28: qty 2

## 2023-02-28 MED ORDER — DICYCLOMINE HCL 20 MG PO TABS: 20.0000 mg | ORAL_TABLET | Freq: Two times a day (BID) | ORAL | 0 refills | Status: DC

## 2023-02-28 MED ORDER — HYDROMORPHONE HCL 1 MG/ML IJ SOLN
0.5000 mg | Freq: Once | INTRAMUSCULAR | Status: AC
  Administered 2023-02-28: 0.5 mg via INTRAVENOUS
  Filled 2023-02-28: qty 1

## 2023-02-28 MED ORDER — KETOROLAC TROMETHAMINE 15 MG/ML IJ SOLN
15.0000 mg | Freq: Once | INTRAMUSCULAR | Status: AC
  Administered 2023-02-28: 15 mg via INTRAVENOUS
  Filled 2023-02-28: qty 1

## 2023-02-28 MED ORDER — IOHEXOL 350 MG/ML SOLN
100.0000 mL | Freq: Once | INTRAVENOUS | Status: AC | PRN
  Administered 2023-02-28: 100 mL via INTRAVENOUS

## 2023-02-28 NOTE — ED Triage Notes (Signed)
Pt to the ed from home via ems with a CC abd with n/v/dx 3 days. Pt relays he was seen last night and giving meds and sent home. Pt relays he is having chills without fever, denies, sob, dizziness, loc,Cpor amy other s/s at this time.

## 2023-02-28 NOTE — Discharge Instructions (Addendum)
Please read and follow all provided instructions.  Your diagnoses today include:  1. Nausea vomiting and diarrhea   2. Elevated serum creatinine     Tests performed today include: Blood cell counts and platelets: infection fighting cells were a little high Kidney and liver function tests: Kidney function was a little weak possibly due to dehydration Pancreas function test (called lipase) CT scan of the abdomen and pelvis, shows diarrhea, no diverticulitis today Vital signs. See below for your results today.   Medications prescribed:  Zofran (ondansetron) - for nausea and vomiting  Imodium - medication for diarrhea  Take any prescribed medications only as directed.  Home care instructions:  Follow any educational materials contained in this packet.  Follow-up instructions: Please follow-up with your primary care provider in the next 5-7 days for further evaluation of your symptoms and recheck of your kidney function  Return instructions:  SEEK IMMEDIATE MEDICAL ATTENTION IF: The pain does not go away or becomes severe  A temperature above 101F develops  Repeated vomiting occurs (multiple episodes)  The pain becomes localized to portions of the abdomen. The right side could possibly be appendicitis. In an adult, the left lower portion of the abdomen could be colitis or diverticulitis.  Blood is being passed in stools or vomit (bright red or black tarry stools)  You develop chest pain, difficulty breathing, dizziness or fainting, or become confused, poorly responsive, or inconsolable (young children) If you have any other emergent concerns regarding your health  Additional Information: Abdominal (belly) pain can be caused by many things. Your caregiver performed an examination and possibly ordered blood/urine tests and imaging (CT scan, x-rays, ultrasound). Many cases can be observed and treated at home after initial evaluation in the emergency department. Even though you are being  discharged home, abdominal pain can be unpredictable. Therefore, you need a repeated exam if your pain does not resolve, returns, or worsens. Most patients with abdominal pain don't have to be admitted to the hospital or have surgery, but serious problems like appendicitis and gallbladder attacks can start out as nonspecific pain. Many abdominal conditions cannot be diagnosed in one visit, so follow-up evaluations are very important.  Your vital signs today were: BP 138/83 (BP Location: Right Arm)   Pulse 82   Temp 98.4 F (36.9 C) (Oral)   Resp 12   SpO2 98%  If your blood pressure (bp) was elevated above 135/85 this visit, please have this repeated by your doctor within one month. --------------

## 2023-02-28 NOTE — ED Provider Notes (Signed)
[name] Provider Note   CSN: [id number] Arrival date & time: 02/28/23  1641     History  Chief Complaint  Patient presents with   Abdominal Pain    Logan Lucas is a 47 y.o. male with a past medical history significant for hypertension, asthma, history of GI bleed, alcohol use, polysubstance abuse who presents to the ED due to persistent nausea, vomiting, and diarrhea associated with diffuse abdominal pain. Denies melena, hematochezia, and hematemesis. Patient seen early this morning with same symptoms where a CT abdomen was performed which was negative for any acute abnormalities.  Patient states his symptoms improved however, returned and have worsened.  Patient denies fever, chest pain, and shortness of breath.  Admits to decreased urine output however, denies dysuria.  No sick contacts.  Previous history of diverticulitis.  No recent antibiotics.  No recent travel.  Denies ingestion of undercooked foods.   History obtained from patient and past medical records. No interpreter used during encounter.       Home Medications Prior to Admission medications   Medication Sig Start Date End Date Taking? Authorizing Provider  dicyclomine (BENTYL) 20 MG tablet Take 1 tablet (20 mg total) by mouth 2 (two) times daily. 02/28/23  Yes Jatasia Gundrum C, PA-C  ondansetron (ZOFRAN-ODT) 4 MG disintegrating tablet Take 1 tablet (4 mg total) by mouth every 8 (eight) hours as needed for nausea or vomiting. 02/28/23  Yes Suzy Bouchard, PA-C  Accu-Chek Softclix Lancets lancets Check your blood sugar before eating each morning and before each meal. 05/25/22   Farrel Gordon, DO  atorvastatin (LIPITOR) 80 MG tablet Take 1 tablet (80 mg total) by mouth daily. 10/15/22   Atway, Jeananne Rama, DO  blood glucose meter kit and supplies KIT Check your blood sugar before eating each morning and before each meal. 05/25/22   Farrel Gordon, DO  empagliflozin (JARDIANCE)  10 MG TABS tablet Take 1 tablet (10 mg total) by mouth daily. 10/15/22   Atway, Rayann N, DO  famotidine (PEPCID) 20 MG tablet Take 1 tablet (20 mg total) by mouth daily. 05/28/22   Lacinda Axon, MD  fenofibrate 54 MG tablet Take 1 tablet (54 mg total) by mouth daily. 10/15/22   Atway, Rayann N, DO  gabapentin (NEURONTIN) 300 MG capsule Take 1 capsule (300 mg total) by mouth 3 (three) times daily. Patient taking differently: Take 300 mg by mouth daily as needed. 05/27/22   Lacinda Axon, MD  glucose blood test strip Check your blood sugar before eating each morning and before each meal. 05/25/22   Farrel Gordon, DO  HYDROmorphone (DILAUDID) 2 MG tablet Take 1 tablet (2 mg total) by mouth every 6 (six) hours as needed for severe pain. 08/03/22   Lacinda Axon, MD  hydrOXYzine (ATARAX) 25 MG tablet Take 1 tablet (25 mg total) by mouth 3 (three) times daily as needed. Patient taking differently: Take 25 mg by mouth 3 (three) times daily as needed for anxiety. 06/05/22   Gaylan Gerold, DO  insulin aspart (NOVOLOG) 100 UNIT/ML FlexPen Inject 10 Units into the skin 3 (three) times daily with meals. 06/05/22   Gaylan Gerold, DO  insulin glargine (LANTUS) 100 UNIT/ML Solostar Pen Inject 25 Units into the skin daily. 06/05/22   Gaylan Gerold, DO  Insulin Pen Needle (PENTIPS) 31G X 8 MM MISC Check your blood sugar before eating each morning and before each meal. 06/21/22   Gaylan Gerold, DO  loperamide (IMODIUM) 2 MG capsule Take 1 capsule (2 mg total) by mouth 4 (four) times daily as needed for diarrhea or loose stools. 02/28/23   Carlisle Cater, PA-C  losartan (COZAAR) 25 MG tablet Take 1 tablet (25 mg total) by mouth daily. 01/26/21 08/01/22  Wouk, Ailene Rud, MD  melatonin 3 MG TABS tablet Take 1 tablet (3 mg total) by mouth at bedtime as needed. Patient taking differently: Take 3 mg by mouth at bedtime as needed (sleep). 05/27/22   Lacinda Axon, MD  metFORMIN (GLUCOPHAGE-XR) 500 MG 24 hr tablet Take 1  tablet (500 mg total) by mouth 2 (two) times daily. 10/25/22   Gaylan Gerold, DO  nicotine (NICODERM CQ - DOSED IN MG/24 HOURS) 14 mg/24hr patch Place 1 patch (14 mg total) onto the skin daily. 08/04/22   Lacinda Axon, MD  ondansetron (ZOFRAN-ODT) 4 MG disintegrating tablet Take 1 tablet (4 mg total) by mouth every 8 (eight) hours as needed for nausea or vomiting. 02/28/23   Carlisle Cater, PA-C      Allergies    Oxycodone    Review of Systems   Review of Systems  Constitutional:  Positive for chills. Negative for fever.  Respiratory:  Negative for shortness of breath.   Cardiovascular:  Negative for chest pain.  Gastrointestinal:  Positive for abdominal pain, diarrhea, nausea and vomiting.  Genitourinary:  Negative for dysuria.    Physical Exam Updated Vital Signs BP (!) 116/100   Pulse 97   Temp 98.4 F (36.9 C) (Oral)   Resp 20   Ht 5\' 9"  (1.753 m)   Wt 122 kg   SpO2 98%   BMI 39.72 kg/m  Physical Exam Vitals and nursing note reviewed.  Constitutional:      General: He is not in acute distress.    Appearance: He is not ill-appearing.  HENT:     Head: Normocephalic.  Eyes:     Pupils: Pupils are equal, round, and reactive to light.  Cardiovascular:     Rate and Rhythm: Normal rate and regular rhythm.     Pulses: Normal pulses.     Heart sounds: Normal heart sounds. No murmur heard.    No friction rub. No gallop.  Pulmonary:     Effort: Pulmonary effort is normal.     Breath sounds: Normal breath sounds.  Abdominal:     General: Abdomen is flat. There is no distension.     Palpations: Abdomen is soft.     Tenderness: There is abdominal tenderness. There is no guarding or rebound.     Comments: Diffuse tenderness. No rebound or guarding.   Musculoskeletal:        General: Normal range of motion.     Cervical back: Neck supple.  Skin:    General: Skin is warm and dry.  Neurological:     General: No focal deficit present.     Mental Status: He is alert.   Psychiatric:        Mood and Affect: Mood normal.        Behavior: Behavior normal.     ED Results / Procedures / Treatments   Labs (all labs ordered are listed, but only abnormal results are displayed) Labs Reviewed  CBC WITH DIFFERENTIAL/PLATELET - Abnormal; Notable for the following components:      Result Value   RBC 6.34 (*)    HCT 54.2 (*)    MCH 25.9 (*)    All other components within normal limits  COMPREHENSIVE METABOLIC PANEL - Abnormal; Notable for the following components:   Sodium 134 (*)    CO2 15 (*)    Glucose, Bld 193 (*)    Calcium 8.5 (*)    All other components within normal limits  RESP PANEL BY RT-PCR (RSV, FLU A&B, COVID)  RVPGX2  LIPASE, BLOOD  URINALYSIS, ROUTINE W REFLEX MICROSCOPIC    EKG EKG Interpretation  Date/Time:  Thursday February 28 2023 16:51:56 EDT Ventricular Rate:  87 PR Interval:  149 QRS Duration: 88 QT Interval:  359 QTC Calculation: 432 R Axis:   52 Text Interpretation: Sinus rhythm Borderline T abnormalities, inferior leads Confirmed by Dene Gentry [phone]) on 02/28/2023 5:25:44 PM  Radiology CT ABDOMEN PELVIS W CONTRAST  Result Date: 02/28/2023 CLINICAL DATA:  Abdominal pain EXAM: CT ABDOMEN AND PELVIS WITH CONTRAST TECHNIQUE: Multidetector CT imaging of the abdomen and pelvis was performed using the standard protocol following bolus administration of intravenous contrast. RADIATION DOSE REDUCTION: This exam was performed according to the departmental dose-optimization program which includes automated exposure control, adjustment of the mA and/or kV according to patient size and/or use of iterative reconstruction technique. CONTRAST:  169mL OMNIPAQUE IOHEXOL 350 MG/ML SOLN COMPARISON:  CT abdomen pelvis dated 07/31/2022. FINDINGS: Lower chest: The visualized lung bases are clear. No intra-abdominal free air or free fluid. Hepatobiliary: No focal liver abnormality is seen. No gallstones, gallbladder wall thickening, or biliary  dilatation. Pancreas: Unremarkable. No pancreatic ductal dilatation or surrounding inflammatory changes. Spleen: Normal in size without focal abnormality. Adrenals/Urinary Tract: Bilateral adrenal nodules similar to prior CT. Small left renal cysts. There is no hydronephrosis on either side. The visualized ureters and urinary bladder appear unremarkable. Stomach/Bowel: There is sigmoid diverticulosis without active inflammatory changes. There is loose stool throughout the colon consistent with diarrheal state. Correlation with clinical exam and stool cultures recommended. There is no bowel obstruction or active inflammation. The appendix is normal. Vascular/Lymphatic: The abdominal aorta and IVC are unremarkable. No portal venous gas. There is no adenopathy. Reproductive: The prostate and seminal vesicles are grossly unremarkable. No pelvic mass Other: None Musculoskeletal: No acute or significant osseous findings. IMPRESSION: 1. Diarrheal state. Correlation with clinical exam and stool cultures recommended. No bowel obstruction. Normal appendix. 2. Sigmoid diverticulosis. Electronically Signed   By: Anner Crete M.D.   On: 02/28/2023 03:49    Procedures Procedures    Medications Ordered in ED Medications  sodium chloride 0.9 % bolus 1,000 mL (0 mLs Intravenous Stopped 02/28/23 2042)  ondansetron (ZOFRAN) injection 4 mg (4 mg Intravenous Given 02/28/23 1707)  morphine (PF) 4 MG/ML injection 4 mg (4 mg Intravenous Given 02/28/23 1751)  ketorolac (TORADOL) 15 MG/ML injection 15 mg (15 mg Intravenous Given 02/28/23 1952)  dicyclomine (BENTYL) injection 20 mg (20 mg Intramuscular Given 02/28/23 2154)  sodium chloride 0.9 % bolus 1,000 mL (1,000 mLs Intravenous New Bag/Given 02/28/23 2154)    ED Course/ Medical Decision Making/ A&P Clinical Course as of 02/28/23 2211  Thu Feb 28, 2023  1849 Reassessed patient at bedside.  Patient resting comfortably in bed.  Admits to some improvement in abdominal pain after  morphine.  Awaiting labs. [CA]  2105 Patient informed RN he is unable to give urine because he has not urinated in 2 days. Bladder scan performed which demonstrated 32mL.  [CA]    Clinical Course User Index [CA] Suzy Bouchard, PA-C  Medical Decision Making Amount and/or Complexity of Data Reviewed Independent Historian: EMS External Data Reviewed: notes. Labs: ordered. Decision-making details documented in ED Course. ECG/medicine tests: ordered and independent interpretation performed. Decision-making details documented in ED Course.  Risk Prescription drug management.   This patient presents to the ED for concern of N,V,D abdominal pain, this involves an extensive number of treatment options, and is a complaint that carries with it a high risk of complications and morbidity.  The differential diagnosis includes gastroenteritis, appendicitis, diverticulitis, dissection, etc  47 year old male presents to the ED due to nausea, vomiting, diarrhea x 2 days associated with abdominal pain.  Seen in the ED earlier this morning where a CT abdomen was performed which was negative for any acute abnormalities.  Patient states symptoms have worsened.  No previous abdominal operations.  No recent travel or antibiotic use.  Upon arrival, patient afebrile, not tachycardic or hypoxic.  Patient in no acute distress.  Diffuse abdominal tenderness without rebound or guarding.  Will hold off on repeat imaging given patient had a CT scan a few hours earlier which was negative for any acute abnormalities.  Abdominal labs ordered.  IV fluids and Zofran given. GI panel from earlier today positive for Sapovirus and E. coli. Suspect symptoms related to gastroenteritis.   Lipase normal.  Doubt pancreatitis.  CBC reassuring.  No leukocytosis.  Normal hemoglobin.  CMP significant for hyperglycemia 193.  No anion gap.  Doubt DKA.  Normal LFTs.  Normal renal function.  RVP negative.  EKG  demonstrates normal sinus rhythm with nonspecific T wave abnormalities.  Low suspicion for atypical ACS.  9:18 PM reassessed patient at bedside.  Patient became extremely agitated once I said his labs were reassuring with improvement from early this morning.  Patient said he was told earlier that he had issues with his kidney.  Creatinine has improved from earlier today.  Patient states he needs to be admitted for his abdominal pain.  CT abdomen negative for any evidence of acute abdomen.  Patient then notes that he is unable to pick up his prescription tonight and did not pick up his prescriptions this morning from previous provider. I offered to send prescriptions to a 24 hour pharmacy. Patient also notes he has been unable to urinate for 2 days. See note above. Will give 1 more L IVFs and if unable to urinate will perform in and out cath. No further episodes of emesis while here in the ED.  Patient handed off to Dr. Francia Greaves pending reassessment and UA after IVFs and bentyl.          Final Clinical Impression(s) / ED Diagnoses Final diagnoses:  Generalized abdominal pain  Nausea vomiting and diarrhea    Rx / DC Orders ED Discharge Orders          Ordered    dicyclomine (BENTYL) 20 MG tablet  2 times daily        02/28/23 2211    ondansetron (ZOFRAN-ODT) 4 MG disintegrating tablet  Every 8 hours PRN        02/28/23 2211              Suzy Bouchard, PA-C 02/28/23 2211    Valarie Merino, MD 02/28/23 [phone]

## 2023-02-28 NOTE — ED Provider Notes (Signed)
[facility] Provider Note   CSN: [id number] Arrival date & time: 02/27/23  2043     History  Chief Complaint  Patient presents with   Abdominal Pain    Logan Lucas is a 47 y.o. male.  Patient with history of diverticulitis, no abdominal surgeries --presents to the emergency department with abdominal pain and diarrhea that started late in the evening on 02/26/2023.  Patient has had frequent episodes of watery diarrhea without blood.  Color is yellow to green.  States that it is mostly liquid and gas.  He reports decreased urination.  Symptoms feel different than when he had diverticulitis last year.  He has generalized abdominal pain.  No chest pain or shortness of breath.  He has felt hot and cold but has not measured his temperature and has not had any shaking chills.  He denies recent travels.  He denies recent antibiotic use.  He denies suspicious food or water exposures.       Home Medications Prior to Admission medications   Medication Sig Start Date End Date Taking? Authorizing Provider  Accu-Chek Softclix Lancets lancets Check your blood sugar before eating each morning and before each meal. 05/25/22   Farrel Gordon, DO  atorvastatin (LIPITOR) 80 MG tablet Take 1 tablet (80 mg total) by mouth daily. 10/15/22   Atway, Jeananne Rama, DO  blood glucose meter kit and supplies KIT Check your blood sugar before eating each morning and before each meal. 05/25/22   Farrel Gordon, DO  empagliflozin (JARDIANCE) 10 MG TABS tablet Take 1 tablet (10 mg total) by mouth daily. 10/15/22   Atway, Rayann N, DO  famotidine (PEPCID) 20 MG tablet Take 1 tablet (20 mg total) by mouth daily. 05/28/22   Lacinda Axon, MD  fenofibrate 54 MG tablet Take 1 tablet (54 mg total) by mouth daily. 10/15/22   Atway, Rayann N, DO  gabapentin (NEURONTIN) 300 MG capsule Take 1 capsule (300 mg total) by mouth 3 (three) times daily. Patient taking differently: Take 300 mg  by mouth daily as needed. 05/27/22   Lacinda Axon, MD  glucose blood test strip Check your blood sugar before eating each morning and before each meal. 05/25/22   Farrel Gordon, DO  HYDROmorphone (DILAUDID) 2 MG tablet Take 1 tablet (2 mg total) by mouth every 6 (six) hours as needed for severe pain. 08/03/22   Lacinda Axon, MD  hydrOXYzine (ATARAX) 25 MG tablet Take 1 tablet (25 mg total) by mouth 3 (three) times daily as needed. Patient taking differently: Take 25 mg by mouth 3 (three) times daily as needed for anxiety. 06/05/22   Gaylan Gerold, DO  insulin aspart (NOVOLOG) 100 UNIT/ML FlexPen Inject 10 Units into the skin 3 (three) times daily with meals. 06/05/22   Gaylan Gerold, DO  insulin glargine (LANTUS) 100 UNIT/ML Solostar Pen Inject 25 Units into the skin daily. 06/05/22   Gaylan Gerold, DO  Insulin Pen Needle (PENTIPS) 31G X 8 MM MISC Check your blood sugar before eating each morning and before each meal. 06/21/22   Gaylan Gerold, DO  losartan (COZAAR) 25 MG tablet Take 1 tablet (25 mg total) by mouth daily. 01/26/21 08/01/22  Wouk, Ailene Rud, MD  melatonin 3 MG TABS tablet Take 1 tablet (3 mg total) by mouth at bedtime as needed. Patient taking differently: Take 3 mg by mouth at bedtime as needed (sleep). 05/27/22   Lacinda Axon, MD  metFORMIN (GLUCOPHAGE-XR) 500  MG 24 hr tablet Take 1 tablet (500 mg total) by mouth 2 (two) times daily. 10/25/22   Gaylan Gerold, DO  nicotine (NICODERM CQ - DOSED IN MG/24 HOURS) 14 mg/24hr patch Place 1 patch (14 mg total) onto the skin daily. 08/04/22   Lacinda Axon, MD  ondansetron (ZOFRAN-ODT) 4 MG disintegrating tablet Take 1 tablet (4 mg total) by mouth every 8 (eight) hours as needed for nausea or vomiting. 08/03/22   Lacinda Axon, MD      Allergies    Oxycodone    Review of Systems   Review of Systems  Physical Exam Updated Vital Signs BP 115/73 (BP Location: Right Arm)   Pulse 87   Temp 98.4 F (36.9 C) (Oral)   Resp 17    SpO2 98%  Physical Exam Vitals and nursing note reviewed.  Constitutional:      General: He is not in acute distress.    Appearance: He is well-developed.  HENT:     Head: Normocephalic and atraumatic.  Eyes:     General:        Right eye: No discharge.        Left eye: No discharge.     Conjunctiva/sclera: Conjunctivae normal.  Cardiovascular:     Rate and Rhythm: Normal rate and regular rhythm.     Heart sounds: Normal heart sounds.  Pulmonary:     Effort: Pulmonary effort is normal.     Breath sounds: Normal breath sounds.  Abdominal:     Palpations: Abdomen is soft.     Tenderness: There is generalized abdominal tenderness. There is no guarding or rebound. Negative signs include Murphy's sign and McBurney's sign.  Musculoskeletal:     Cervical back: Normal range of motion and neck supple.  Skin:    General: Skin is warm and dry.  Neurological:     Mental Status: He is alert.     ED Results / Procedures / Treatments   Labs (all labs ordered are listed, but only abnormal results are displayed) Labs Reviewed  COMPREHENSIVE METABOLIC PANEL - Abnormal; Notable for the following components:      Result Value   Sodium 131 (*)    Potassium 3.4 (*)    CO2 19 (*)    Glucose, Bld 179 (*)    Creatinine, Ser 1.52 (*)    Calcium 8.8 (*)    GFR, Estimated 58 (*)    All other components within normal limits  CBC WITH DIFFERENTIAL/PLATELET - Abnormal; Notable for the following components:   WBC 13.1 (*)    RBC 6.20 (*)    Neutro Abs 9.0 (*)    All other components within normal limits  GASTROINTESTINAL PANEL BY PCR, STOOL (REPLACES STOOL CULTURE)  LIPASE, BLOOD  LACTIC ACID, PLASMA    EKG None  Radiology CT ABDOMEN PELVIS W CONTRAST  Result Date: 02/28/2023 CLINICAL DATA:  Abdominal pain EXAM: CT ABDOMEN AND PELVIS WITH CONTRAST TECHNIQUE: Multidetector CT imaging of the abdomen and pelvis was performed using the standard protocol following bolus administration of  intravenous contrast. RADIATION DOSE REDUCTION: This exam was performed according to the departmental dose-optimization program which includes automated exposure control, adjustment of the mA and/or kV according to patient size and/or use of iterative reconstruction technique. CONTRAST:  132mL OMNIPAQUE IOHEXOL 350 MG/ML SOLN COMPARISON:  CT abdomen pelvis dated 07/31/2022. FINDINGS: Lower chest: The visualized lung bases are clear. No intra-abdominal free air or free fluid. Hepatobiliary: No focal liver abnormality is seen. No  gallstones, gallbladder wall thickening, or biliary dilatation. Pancreas: Unremarkable. No pancreatic ductal dilatation or surrounding inflammatory changes. Spleen: Normal in size without focal abnormality. Adrenals/Urinary Tract: Bilateral adrenal nodules similar to prior CT. Small left renal cysts. There is no hydronephrosis on either side. The visualized ureters and urinary bladder appear unremarkable. Stomach/Bowel: There is sigmoid diverticulosis without active inflammatory changes. There is loose stool throughout the colon consistent with diarrheal state. Correlation with clinical exam and stool cultures recommended. There is no bowel obstruction or active inflammation. The appendix is normal. Vascular/Lymphatic: The abdominal aorta and IVC are unremarkable. No portal venous gas. There is no adenopathy. Reproductive: The prostate and seminal vesicles are grossly unremarkable. No pelvic mass Other: None Musculoskeletal: No acute or significant osseous findings. IMPRESSION: 1. Diarrheal state. Correlation with clinical exam and stool cultures recommended. No bowel obstruction. Normal appendix. 2. Sigmoid diverticulosis. Electronically Signed   By: Anner Crete M.D.   On: 02/28/2023 03:49    Procedures Procedures    Medications Ordered in ED Medications  sodium chloride 0.9 % bolus 1,000 mL (has no administration in time range)  HYDROmorphone (DILAUDID) injection 0.5 mg (has  no administration in time range)  ondansetron (ZOFRAN) injection 4 mg (has no administration in time range)    ED Course/ Medical Decision Making/ A&P    Patient seen and examined. History obtained directly from patient.  I reviewed patient's previous hospitalization notes for diverticulitis.  Work-up including labs, imaging, EKG ordered in triage, if performed, were reviewed.    Labs/EKG: Independently reviewed and interpreted.  This included: CBC with elevated white blood cell count of 13.1, hemoglobin high normal at 16.5 indicating degree of dehydration; CMP with mildly low sodium at 131, mildly low potassium at 3.4, glucose 179 with normal anion gap, bicarb low at 19, creatinine elevated from baseline at 1.52 with a BUN of 14; lipase normal.  Lactate added due to low bicarb and to evaluate for the possibility of ischemic colitis.  Imaging: CT was ordered in triage and is pending.  Medications/Fluids: Ordered: IV fluid bolus, Dilaudid IV 0.5 mg, Zofran IV 4 mg.  Most recent vital signs reviewed and are as follows: BP 115/73 (BP Location: Right Arm)   Pulse 87   Temp 98.4 F (36.9 C) (Oral)   Resp 17   SpO2 98%   Initial impression: Generalized abdominal pain with diarrhea   6:52 AM Reassessment performed. Patient appears stable.  He had an episode of vomiting shortly after CT, no recurrent symptoms since then.  He is tolerating fluids.  Labs personally reviewed and interpreted including: Lactate was normal  Imaging personally visualized and interpreted including: CT with diarrheal illness, no other significant findings, no recurrent diverticulitis.  Reviewed pertinent lab work and imaging with patient at bedside. Questions answered.   GI stool studies were ordered and during monitoring after vomiting and p.o. challenge, patient did not have any additional stooling.  Most current vital signs reviewed and are as follows: BP 138/83 (BP Location: Right Arm)   Pulse 82   Temp  98.4 F (36.9 C) (Oral)   Resp 12   SpO2 98%   Plan: Discharge to home.   Prescriptions written for: Imodium, Zofran  Other home care instructions discussed: Maintain good hydration  ED return instructions discussed: The patient was urged to return to the Emergency Department immediately with worsening of current symptoms, worsening abdominal pain, persistent vomiting, blood noted in stools, fever, or any other concerns. The patient verbalized understanding.   Follow-up  instructions discussed: Patient encouraged to follow-up with their PCP in 3 days for symptom recheck and recheck of kidney function.                            Medical Decision Making Amount and/or Complexity of Data Reviewed Labs: ordered. Radiology: ordered.  Risk Prescription drug management.   For this patient's complaint of abdominal pain, the following conditions were considered on the differential diagnosis: gastritis/PUD, enteritis/duodenitis, appendicitis, cholelithiasis/cholecystitis, cholangitis, pancreatitis, ruptured viscus, colitis, diverticulitis, small/large bowel obstruction, proctitis, cystitis, pyelonephritis, ureteral colic, aortic dissection, aortic aneurysm. In women, ectopic pregnancy, pelvic inflammatory disease, ovarian cysts, and tubo-ovarian abscess were also considered. Atypical chest etiologies were also considered including ACS, PE, and pneumonia.  Main concern with his current diarrhea is dehydration, evidenced by hyponatremia, bump in serum creatinine.  Blood pressures have been normal.  Lactate was normal.  Patient was treated with IV fluids.  He did have 1 episode of vomiting, treated with additional antiemetics.  Vomiting improved.  Unable to obtain stool studies during period of observation as patient did not have any further diarrhea.  At this time, feel that symptom control and maintaining good hydration at home is indicated with close follow-up and return if worsening.  No focal  findings on exam and no signs of a surgical abdomen at this time.   The patient's vital signs, pertinent lab work and imaging were reviewed and interpreted as discussed in the ED course. Hospitalization was considered for further testing, treatments, or serial exams/observation. However as patient is well-appearing, has a stable exam, and reassuring studies today, I do not feel that they warrant admission at this time. This plan was discussed with the patient who verbalizes agreement and comfort with this plan and seems reliable and able to return to the Emergency Department with worsening or changing symptoms.           Final Clinical Impression(s) / ED Diagnoses Final diagnoses:  Nausea vomiting and diarrhea  Elevated serum creatinine    Rx / DC Orders ED Discharge Orders          Ordered    ondansetron (ZOFRAN-ODT) 4 MG disintegrating tablet  Every 8 hours PRN        02/28/23 0518    loperamide (IMODIUM) 2 MG capsule  4 times daily PRN        02/28/23 0518              Carlisle Cater, PA-C 02/28/23 [id number]    Quintella Reichert, MD 02/28/23 [id number]

## 2023-03-01 LAB — URINALYSIS, ROUTINE W REFLEX MICROSCOPIC
Bacteria, UA: NONE SEEN
Bilirubin Urine: NEGATIVE
Glucose, UA: NEGATIVE mg/dL
Hgb urine dipstick: NEGATIVE
Ketones, ur: 20 mg/dL — AB
Leukocytes,Ua: NEGATIVE
Nitrite: NEGATIVE
Protein, ur: 30 mg/dL — AB
Specific Gravity, Urine: 1.032 — ABNORMAL HIGH (ref 1.005–1.030)
pH: 5 (ref 5.0–8.0)

## 2023-03-01 NOTE — ED Notes (Signed)
Cab voucher given. [employer] #[id number] est $17

## 2023-03-01 NOTE — Discharge Instructions (Signed)
Return for any problem.  ?

## 2023-03-01 NOTE — ED Provider Notes (Signed)
Patient seen at prior EDP.  Patient is taking p.o. well.  Screening labs obtained are without significant abnormality.  Symptoms experienced by the patient are most likely secondary to viral gastroenteritis.  Importance of close follow-up stressed.  Strict return precautions given and understood.   Wynetta Fines, MD 03/01/23 [phone]

## 2023-06-27 ENCOUNTER — Emergency Department (HOSPITAL_COMMUNITY)
Admission: EM | Admit: 2023-06-27 | Discharge: 2023-06-27 | Disposition: A | Discharge status: Home / Self Care | Payer: Self-pay | Attending: Emergency Medicine | Admitting: Emergency Medicine

## 2023-06-27 ENCOUNTER — Other Ambulatory Visit: Payer: Self-pay

## 2023-06-27 ENCOUNTER — Other Ambulatory Visit (HOSPITAL_COMMUNITY): Payer: Self-pay

## 2023-06-27 ENCOUNTER — Encounter (HOSPITAL_COMMUNITY): Payer: Self-pay | Admitting: *Deleted

## 2023-06-27 DIAGNOSIS — D72829 Elevated white blood cell count, unspecified: Secondary | ICD-10-CM | POA: Insufficient documentation

## 2023-06-27 DIAGNOSIS — R739 Hyperglycemia, unspecified: Secondary | ICD-10-CM

## 2023-06-27 DIAGNOSIS — Z7984 Long term (current) use of oral hypoglycemic drugs: Secondary | ICD-10-CM | POA: Insufficient documentation

## 2023-06-27 DIAGNOSIS — E1165 Type 2 diabetes mellitus with hyperglycemia: Secondary | ICD-10-CM | POA: Insufficient documentation

## 2023-06-27 DIAGNOSIS — G629 Polyneuropathy, unspecified: Secondary | ICD-10-CM | POA: Insufficient documentation

## 2023-06-27 DIAGNOSIS — Z794 Long term (current) use of insulin: Secondary | ICD-10-CM | POA: Insufficient documentation

## 2023-06-27 DIAGNOSIS — I10 Essential (primary) hypertension: Secondary | ICD-10-CM | POA: Insufficient documentation

## 2023-06-27 DIAGNOSIS — J45909 Unspecified asthma, uncomplicated: Secondary | ICD-10-CM | POA: Insufficient documentation

## 2023-06-27 HISTORY — DX: Type 2 diabetes mellitus without complications: E11.9

## 2023-06-27 LAB — URINALYSIS, ROUTINE W REFLEX MICROSCOPIC
Bilirubin Urine: NEGATIVE
Glucose, UA: NEGATIVE mg/dL
Hgb urine dipstick: NEGATIVE
Ketones, ur: NEGATIVE mg/dL
Leukocytes,Ua: NEGATIVE
Nitrite: NEGATIVE
Protein, ur: NEGATIVE mg/dL
Specific Gravity, Urine: 1.012 (ref 1.005–1.030)
pH: 5 (ref 5.0–8.0)

## 2023-06-27 LAB — COMPREHENSIVE METABOLIC PANEL
ALT: 16 U/L (ref 0–44)
AST: 13 U/L — ABNORMAL LOW (ref 15–41)
Albumin: 3.4 g/dL — ABNORMAL LOW (ref 3.5–5.0)
Alkaline Phosphatase: 80 U/L (ref 38–126)
Anion gap: 13 (ref 5–15)
BUN: 12 mg/dL (ref 6–20)
CO2: 20 mmol/L — ABNORMAL LOW (ref 22–32)
Calcium: 8.7 mg/dL — ABNORMAL LOW (ref 8.9–10.3)
Chloride: 102 mmol/L (ref 98–111)
Creatinine, Ser: 1.11 mg/dL (ref 0.61–1.24)
GFR, Estimated: >60 mL/min (ref >60)
Glucose, Bld: 146 mg/dL — ABNORMAL HIGH (ref 70–99)
Potassium: 3.4 mmol/L — ABNORMAL LOW (ref 3.5–5.1)
Sodium: 135 mmol/L (ref 135–145)
Total Bilirubin: 0.4 mg/dL (ref 0.3–1.2)
Total Protein: 6 g/dL — ABNORMAL LOW (ref 6.5–8.1)

## 2023-06-27 LAB — I-STAT VENOUS BLOOD GAS, ED
Acid-base deficit: 1 mmol/L (ref 0.0–2.0)
Bicarbonate: 22.9 mmol/L (ref 20.0–28.0)
Calcium, Ion: 1.09 mmol/L — ABNORMAL LOW (ref 1.15–1.40)
HCT: 43 % (ref 39.0–52.0)
Hemoglobin: 14.6 g/dL (ref 13.0–17.0)
O2 Saturation: 85 %
Potassium: 3.4 mmol/L — ABNORMAL LOW (ref 3.5–5.1)
Sodium: 139 mmol/L (ref 135–145)
TCO2: 24 mmol/L (ref 22–32)
pCO2, Ven: 35.8 mmHg — ABNORMAL LOW (ref 44–60)
pH, Ven: 7.414 (ref 7.25–7.43)
pO2, Ven: 49 mmHg — ABNORMAL HIGH (ref 32–45)

## 2023-06-27 LAB — CBC
HCT: 46.2 % (ref 39.0–52.0)
Hemoglobin: 14.7 g/dL (ref 13.0–17.0)
MCH: 26.4 pg (ref 26.0–34.0)
MCHC: 31.8 g/dL (ref 30.0–36.0)
MCV: 82.9 fL (ref 80.0–100.0)
Platelets: 308 10*3/uL (ref 150–400)
RBC: 5.57 MIL/uL (ref 4.22–5.81)
RDW: 13.7 % (ref 11.5–15.5)
WBC: 11.1 10*3/uL — ABNORMAL HIGH (ref 4.0–10.5)
nRBC: 0 % (ref 0.0–0.2)

## 2023-06-27 LAB — CBG MONITORING, ED: Glucose-Capillary: 162 mg/dL — ABNORMAL HIGH (ref 70–99)

## 2023-06-27 LAB — LIPASE, BLOOD: Lipase: 46 U/L (ref 11–51)

## 2023-06-27 MED ORDER — GLUCOSE BLOOD VI STRP
ORAL_STRIP | 0 refills | Status: DC
  Filled 2023-06-27: qty 50, 17d supply, fill #0
  Filled 2023-06-27: qty 100, 30d supply, fill #0

## 2023-06-27 MED ORDER — PANTOPRAZOLE SODIUM 40 MG IV SOLR
40.0000 mg | Freq: Once | INTRAVENOUS | Status: AC
  Administered 2023-06-27: 40 mg via INTRAVENOUS
  Filled 2023-06-27: qty 10

## 2023-06-27 MED ORDER — ONDANSETRON HCL 4 MG/2ML IJ SOLN
4.0000 mg | Freq: Once | INTRAMUSCULAR | Status: AC
  Administered 2023-06-27: 4 mg via INTRAVENOUS
  Filled 2023-06-27: qty 2

## 2023-06-27 MED ORDER — TRUEPLUS LANCETS 28G MISC
0 refills | Status: DC
  Filled 2023-06-27: qty 100, 30d supply, fill #0
  Filled 2023-06-27: qty 100, 34d supply, fill #0

## 2023-06-27 MED ORDER — BLOOD GLUCOSE MONITOR KIT
PACK | 0 refills | Status: DC
  Filled 2023-06-27 (×2): qty 1, 30d supply, fill #0

## 2023-06-27 MED ORDER — GABAPENTIN 300 MG PO CAPS
300.0000 mg | ORAL_CAPSULE | Freq: Three times a day (TID) | ORAL | 5 refills | Status: DC
  Filled 2023-06-27 (×2): qty 90, 30d supply, fill #0

## 2023-06-27 MED ORDER — TRUEDRAW LANCING DEVICE MISC
0 refills | Status: DC
  Filled 2023-06-27: qty 1, 30d supply, fill #0

## 2023-06-27 MED ORDER — SODIUM CHLORIDE 0.9 % IV BOLUS
1000.0000 mL | Freq: Once | INTRAVENOUS | Status: AC
  Administered 2023-06-27: 1000 mL via INTRAVENOUS

## 2023-06-27 NOTE — ED Triage Notes (Addendum)
Pt c/o extremity pins, needles, and pain "I think my neuropathy is acting up". Pt is diabetic, not taking his meds (metfomin, insulin), c/o blurry vision. Upper abdominal pain for several days.

## 2023-06-27 NOTE — ED Provider Notes (Signed)
[facility] DEPARTMENT AT [facility] Provider Note   CSN: [id number] Arrival date & time: 06/27/23  0715     History  Chief complaint: Neuropathy pain, abdominal discomfort  Logan Lucas is a 47 y.o. male.  HPI   Patient has a history of asthma hypertension GI bleed, diabetes.  Patient states he has not been taking his diabetes medication for a couple of months.  Patient states since that time he has been having issues with his neuropathy acting up.  Patient is having a pins-and-needles sensation in both his upper and lower extremities.  Home Medications Prior to Admission medications   Medication Sig Start Date End Date Taking? Authorizing Provider  Accu-Chek Softclix Lancets lancets Check your blood sugar before eating each morning and before each meal. 05/25/22   Champ Mungo, DO  atorvastatin (LIPITOR) 80 MG tablet Take 1 tablet (80 mg total) by mouth daily. 10/15/22   Atway, Derwood Kaplan, DO  blood glucose meter kit and supplies KIT Check your blood sugar before eating each morning and before each meal. 05/25/22   Champ Mungo, DO  dicyclomine (BENTYL) 20 MG tablet Take 1 tablet (20 mg total) by mouth 2 (two) times daily. 02/28/23   Mannie Stabile, PA-C  empagliflozin (JARDIANCE) 10 MG TABS tablet Take 1 tablet (10 mg total) by mouth daily. 10/15/22   Atway, Rayann N, DO  famotidine (PEPCID) 20 MG tablet Take 1 tablet (20 mg total) by mouth daily. 05/28/22   Steffanie Rainwater, MD  fenofibrate 54 MG tablet Take 1 tablet (54 mg total) by mouth daily. 10/15/22   Atway, Rayann N, DO  gabapentin (NEURONTIN) 300 MG capsule Take 1 capsule (300 mg total) by mouth 3 (three) times daily. Patient taking differently: Take 300 mg by mouth daily as needed. 05/27/22   Steffanie Rainwater, MD  glucose blood test strip Check your blood sugar before eating each morning and before each meal. 05/25/22   Champ Mungo, DO  HYDROmorphone (DILAUDID) 2 MG tablet Take 1 tablet (2 mg total)  by mouth every 6 (six) hours as needed for severe pain. 08/03/22   Steffanie Rainwater, MD  hydrOXYzine (ATARAX) 25 MG tablet Take 1 tablet (25 mg total) by mouth 3 (three) times daily as needed. Patient taking differently: Take 25 mg by mouth 3 (three) times daily as needed for anxiety. 06/05/22   Doran Stabler, DO  insulin aspart (NOVOLOG) 100 UNIT/ML FlexPen Inject 10 Units into the skin 3 (three) times daily with meals. 06/05/22   Doran Stabler, DO  insulin glargine (LANTUS) 100 UNIT/ML Solostar Pen Inject 25 Units into the skin daily. 06/05/22   Doran Stabler, DO  Insulin Pen Needle (PENTIPS) 31G X 8 MM MISC Check your blood sugar before eating each morning and before each meal. 06/21/22   Doran Stabler, DO  loperamide (IMODIUM) 2 MG capsule Take 1 capsule (2 mg total) by mouth 4 (four) times daily as needed for diarrhea or loose stools. 02/28/23   Renne Crigler, PA-C  losartan (COZAAR) 25 MG tablet Take 1 tablet (25 mg total) by mouth daily. 01/26/21 08/01/22  Wouk, Wilfred Curtis, MD  melatonin 3 MG TABS tablet Take 1 tablet (3 mg total) by mouth at bedtime as needed. Patient taking differently: Take 3 mg by mouth at bedtime as needed (sleep). 05/27/22   Steffanie Rainwater, MD  metFORMIN (GLUCOPHAGE-XR) 500 MG 24 hr tablet Take 1 tablet (500 mg total) by mouth 2 (two) times daily. 10/25/22  Doran Stabler, DO  nicotine (NICODERM CQ - DOSED IN MG/24 HOURS) 14 mg/24hr patch Place 1 patch (14 mg total) onto the skin daily. 08/04/22   Steffanie Rainwater, MD  ondansetron (ZOFRAN-ODT) 4 MG disintegrating tablet Take 1 tablet (4 mg total) by mouth every 8 (eight) hours as needed for nausea or vomiting. 02/28/23   Renne Crigler, PA-C  ondansetron (ZOFRAN-ODT) 4 MG disintegrating tablet Take 1 tablet (4 mg total) by mouth every 8 (eight) hours as needed for nausea or vomiting. 02/28/23   Mannie Stabile, PA-C      Allergies    Oxycodone    Review of Systems   Review of Systems  Physical Exam Updated Vital  Signs BP (!) 121/107 (BP Location: Right Arm)   Pulse 79   Temp 98.6 F (37 C) (Oral)   Resp 16   SpO2 100%  Physical Exam Vitals and nursing note reviewed.  Constitutional:      General: He is not in acute distress.    Appearance: He is well-developed.  HENT:     Head: Normocephalic and atraumatic.     Right Ear: External ear normal.     Left Ear: External ear normal.  Eyes:     General: No scleral icterus.       Right eye: No discharge.        Left eye: No discharge.     Conjunctiva/sclera: Conjunctivae normal.  Neck:     Trachea: No tracheal deviation.  Cardiovascular:     Rate and Rhythm: Normal rate and regular rhythm.  Pulmonary:     Effort: Pulmonary effort is normal. No respiratory distress.     Breath sounds: Normal breath sounds. No stridor. No wheezing or rales.  Abdominal:     General: Bowel sounds are normal. There is no distension.     Palpations: Abdomen is soft.     Tenderness: There is no abdominal tenderness. There is no guarding or rebound.  Musculoskeletal:        General: No tenderness or deformity.     Cervical back: Neck supple.  Skin:    General: Skin is warm and dry.     Findings: No rash.  Neurological:     General: No focal deficit present.     Mental Status: He is alert.     Cranial Nerves: No cranial nerve deficit, dysarthria or facial asymmetry.     Sensory: No sensory deficit.     Motor: No abnormal muscle tone or seizure activity.     Coordination: Coordination normal.  Psychiatric:        Mood and Affect: Mood normal.     ED Results / Procedures / Treatments   Labs (all labs ordered are listed, but only abnormal results are displayed) Labs Reviewed  LIPASE, BLOOD  COMPREHENSIVE METABOLIC PANEL  CBC  URINALYSIS, ROUTINE W REFLEX MICROSCOPIC  CBG MONITORING, ED  I-STAT VENOUS BLOOD GAS, ED    EKG None  Radiology No results found.  Procedures Procedures    Medications Ordered in ED Medications  sodium chloride 0.9  % bolus 1,000 mL (has no administration in time range)  ondansetron (ZOFRAN) injection 4 mg (has no administration in time range)  pantoprazole (PROTONIX) injection 40 mg (has no administration in time range)    ED Course/ Medical Decision Making/ A&P Clinical Course as of 06/27/23 1145  Thu Jun 27, 2023  0840 His blood gas does not show any evidence of acidosis [JK]  0952 Metabolic panel  without significant abnormalities.  CBC shows slight elevation white blood cell count [JK]    Clinical Course User Index [JK] Linwood Dibbles, MD                                 Medical Decision Making Problems Addressed: Hyperglycemia: chronic illness or injury Neuropathy: chronic illness or injury with exacerbation, progression, or side effects of treatment  Amount and/or Complexity of Data Reviewed Labs: ordered. Decision-making details documented in ED Course.  Risk OTC drugs. Prescription drug management.   Patient presented to ED for evaluation of neuropathic type pain as well as possible abnormal blood sugar.  Patient states he has not been taking any of his medications.  Patient does have history of diabetes.  He is also supposed to be on gabapentin.  ED workup reassuring.  Abdominal exam is benign.  He has no focal tenderness.  Doubt pancreatitis hepatitis cholecystitis appendicitis.  Patient does have mild hyperglycemia but has no acidosis and his blood sugar is only 146.  No signs of any acute complications with his diabetes.  Patient had not been taking his medication or monitoring his blood sugar.  With his blood sugar 146 I am not sure that he needs to be on diabetic medications at this time.  Will have him follow-up with a primary care doctor to review that.  He is having some neuropathic type pain and had been taking gabapentin in the past.  He does not have any evidence of infection or neurovascular compromise.  Will refill his gabapentin and stressed importance of follow-up with a primary  care doctor.  Evaluation and diagnostic testing in the emergency department does not suggest an emergent condition requiring admission or immediate intervention beyond what has been performed at this time.  The patient is safe for discharge and has been instructed to return immediately for worsening symptoms, change in symptoms or any other concerns.        Final Clinical Impression(s) / ED Diagnoses Final diagnoses:  None    Rx / DC Orders ED Discharge Orders     None         Linwood Dibbles, MD 06/27/23 1147

## 2023-06-27 NOTE — Discharge Instructions (Addendum)
Follow-up with your primary care doctor to check on your blood sugar and to discuss your diabetes regimen.  Take the gabapentin to help with your neuropathy.

## 2023-06-27 NOTE — ED Notes (Signed)
Pt given urinal at this time 

## 2023-06-29 ENCOUNTER — Other Ambulatory Visit (HOSPITAL_COMMUNITY): Payer: Self-pay

## 2023-07-03 ENCOUNTER — Other Ambulatory Visit (HOSPITAL_COMMUNITY): Payer: Self-pay

## 2023-07-23 ENCOUNTER — Emergency Department (HOSPITAL_COMMUNITY)
Admission: EM | Admit: 2023-07-23 | Discharge: 2023-07-24 | Disposition: A | Discharge status: Home / Self Care | Payer: Self-pay | Attending: Emergency Medicine | Admitting: Emergency Medicine

## 2023-07-23 ENCOUNTER — Emergency Department (HOSPITAL_COMMUNITY): Payer: Self-pay

## 2023-07-23 ENCOUNTER — Other Ambulatory Visit: Payer: Self-pay

## 2023-07-23 DIAGNOSIS — R519 Headache, unspecified: Secondary | ICD-10-CM | POA: Insufficient documentation

## 2023-07-23 DIAGNOSIS — I110 Hypertensive heart disease with heart failure: Secondary | ICD-10-CM | POA: Insufficient documentation

## 2023-07-23 DIAGNOSIS — H538 Other visual disturbances: Secondary | ICD-10-CM | POA: Insufficient documentation

## 2023-07-23 DIAGNOSIS — E119 Type 2 diabetes mellitus without complications: Secondary | ICD-10-CM | POA: Insufficient documentation

## 2023-07-23 DIAGNOSIS — Z79899 Other long term (current) drug therapy: Secondary | ICD-10-CM | POA: Insufficient documentation

## 2023-07-23 DIAGNOSIS — I509 Heart failure, unspecified: Secondary | ICD-10-CM | POA: Insufficient documentation

## 2023-07-23 DIAGNOSIS — R531 Weakness: Secondary | ICD-10-CM | POA: Insufficient documentation

## 2023-07-23 DIAGNOSIS — Z794 Long term (current) use of insulin: Secondary | ICD-10-CM | POA: Insufficient documentation

## 2023-07-23 DIAGNOSIS — M5412 Radiculopathy, cervical region: Secondary | ICD-10-CM | POA: Insufficient documentation

## 2023-07-23 DIAGNOSIS — Z7984 Long term (current) use of oral hypoglycemic drugs: Secondary | ICD-10-CM | POA: Insufficient documentation

## 2023-07-23 DIAGNOSIS — R55 Syncope and collapse: Secondary | ICD-10-CM | POA: Insufficient documentation

## 2023-07-23 DIAGNOSIS — R42 Dizziness and giddiness: Secondary | ICD-10-CM | POA: Insufficient documentation

## 2023-07-23 DIAGNOSIS — R209 Unspecified disturbances of skin sensation: Secondary | ICD-10-CM | POA: Insufficient documentation

## 2023-07-23 LAB — BASIC METABOLIC PANEL
Anion gap: 8 (ref 5–15)
BUN: 12 mg/dL (ref 6–20)
CO2: 24 mmol/L (ref 22–32)
Calcium: 8.3 mg/dL — ABNORMAL LOW (ref 8.9–10.3)
Chloride: 104 mmol/L (ref 98–111)
Creatinine, Ser: 1.07 mg/dL (ref 0.61–1.24)
GFR, Estimated: >60 mL/min (ref >60)
Glucose, Bld: 138 mg/dL — ABNORMAL HIGH (ref 70–99)
Potassium: 3.5 mmol/L (ref 3.5–5.1)
Sodium: 136 mmol/L (ref 135–145)

## 2023-07-23 LAB — URINALYSIS, ROUTINE W REFLEX MICROSCOPIC
Bacteria, UA: NONE SEEN
Bilirubin Urine: NEGATIVE
Glucose, UA: NEGATIVE mg/dL
Hgb urine dipstick: NEGATIVE
Ketones, ur: NEGATIVE mg/dL
Nitrite: NEGATIVE
Protein, ur: NEGATIVE mg/dL
Specific Gravity, Urine: 1.021 (ref 1.005–1.030)
pH: 5 (ref 5.0–8.0)

## 2023-07-23 LAB — CBC
HCT: 43.1 % (ref 39.0–52.0)
Hemoglobin: 13.3 g/dL (ref 13.0–17.0)
MCH: 25.8 pg — ABNORMAL LOW (ref 26.0–34.0)
MCHC: 30.9 g/dL (ref 30.0–36.0)
MCV: 83.7 fL (ref 80.0–100.0)
Platelets: 276 10*3/uL (ref 150–400)
RBC: 5.15 MIL/uL (ref 4.22–5.81)
RDW: 14.3 % (ref 11.5–15.5)
WBC: 9 10*3/uL (ref 4.0–10.5)
nRBC: 0 % (ref 0.0–0.2)

## 2023-07-23 NOTE — ED Notes (Signed)
Assumed care of patient here c/o syncopy 3 days ago while at bus stop. Patient reports since that episode he has been having a constant headache with blurred vision and numbness and tingling and weakness to right arm. Patient a/o x 4 respirations even and non labored . Pt denies hitting head during episode. Vs wnl

## 2023-07-23 NOTE — ED Triage Notes (Signed)
Patient reports passing out at the bus stop three days ago and when he woke up his vision was blurry and he's had a headache and intermittent L arm tingling and pain since then.

## 2023-07-24 ENCOUNTER — Emergency Department (HOSPITAL_COMMUNITY): Payer: Self-pay

## 2023-07-24 NOTE — ED Notes (Signed)
Patient given [employer] pass

## 2023-07-24 NOTE — ED Notes (Signed)
Patient currently asleep in bed eyes closed respirations even and non labored easily aroused to light touch

## 2023-07-24 NOTE — ED Provider Notes (Signed)
[place] EMERGENCY DEPARTMENT AT [facility] Provider Note   CSN: [id number] Arrival date & time: 07/23/23  1745     History  Chief Complaint  Patient presents with   Loss of Consciousness   Tingling    Logan Lucas is a 47 y.o. male.  The history is provided by the patient.  Patient with history of hypertension and diabetes, heart failure with preserved EF presents with multiple complaints.  Patient reports around 3 days ago he was at a bus stop and felt very overheated as he had been outside for several hours.  He reports he was sweating profusely and felt lightheaded.  He reports the next thing he knew he woke up on the ground.  He reports since that time he is been having numbness throughout the left side of his body.  He also reports weakness on the left upper and lower extremity.  He reports headache and blurred vision.  No active chest pain or shortness of breath.  Patient reports he has not had access to his daily medications for quite some time. Patient denies any traumatic injuries from the syncopal episode    Home Medications Prior to Admission medications   Medication Sig Start Date End Date Taking? Authorizing Provider  TRUEplus Lancets 28G MISC Check your blood sugar before eating each morning and before each meal. 06/27/23   Linwood Dibbles, MD  atorvastatin (LIPITOR) 80 MG tablet Take 1 tablet (80 mg total) by mouth daily. Patient not taking: Reported on 06/27/2023 10/15/22   Chauncey Mann, DO  blood glucose meter kit and supplies KIT Check your blood sugar before eating each morning and before each meal. 06/27/23   Linwood Dibbles, MD  dicyclomine (BENTYL) 20 MG tablet Take 1 tablet (20 mg total) by mouth 2 (two) times daily. Patient not taking: Reported on 06/27/2023 02/28/23   Mannie Stabile, PA-C  empagliflozin (JARDIANCE) 10 MG TABS tablet Take 1 tablet (10 mg total) by mouth daily. Patient not taking: Reported on 06/27/2023 10/15/22   Atway, Rayann N, DO   famotidine (PEPCID) 20 MG tablet Take 1 tablet (20 mg total) by mouth daily. Patient not taking: Reported on 06/27/2023 05/28/22   Steffanie Rainwater, MD  fenofibrate 54 MG tablet Take 1 tablet (54 mg total) by mouth daily. Patient not taking: Reported on 06/27/2023 10/15/22   Atway, Rayann N, DO  gabapentin (NEURONTIN) 300 MG capsule Take 1 capsule (300 mg total) by mouth 3 (three) times daily. 06/27/23   Linwood Dibbles, MD  glucose blood test strip Check your blood sugar before eating each morning and before each meal. 06/27/23   Linwood Dibbles, MD  HYDROmorphone (DILAUDID) 2 MG tablet Take 1 tablet (2 mg total) by mouth every 6 (six) hours as needed for severe pain. Patient not taking: Reported on 06/27/2023 08/03/22   Steffanie Rainwater, MD  hydrOXYzine (ATARAX) 25 MG tablet Take 1 tablet (25 mg total) by mouth 3 (three) times daily as needed. Patient not taking: Reported on 06/27/2023 06/05/22   Doran Stabler, DO  insulin aspart (NOVOLOG) 100 UNIT/ML FlexPen Inject 10 Units into the skin 3 (three) times daily with meals. Patient not taking: Reported on 06/27/2023 06/05/22   Doran Stabler, DO  insulin glargine (LANTUS) 100 UNIT/ML Solostar Pen Inject 25 Units into the skin daily. Patient not taking: Reported on 06/27/2023 06/05/22   Doran Stabler, DO  Insulin Pen Needle (PENTIPS) 31G X 8 MM MISC Check your blood sugar before eating each  morning and before each meal. Patient not taking: Reported on 06/27/2023 06/21/22   Doran Stabler, DO  Lancet Devices (TRUEDRAW LANCING DEVICE) MISC Use as directed 06/27/23   Linwood Dibbles, MD  loperamide (IMODIUM) 2 MG capsule Take 1 capsule (2 mg total) by mouth 4 (four) times daily as needed for diarrhea or loose stools. Patient not taking: Reported on 06/27/2023 02/28/23   Renne Crigler, PA-C  losartan (COZAAR) 25 MG tablet Take 1 tablet (25 mg total) by mouth daily. Patient not taking: Reported on 06/27/2023 01/26/21 08/01/22  Wouk, Wilfred Curtis, MD  melatonin 3 MG TABS tablet Take 1 tablet (3 mg  total) by mouth at bedtime as needed. Patient not taking: Reported on 06/27/2023 05/27/22   Steffanie Rainwater, MD  metFORMIN (GLUCOPHAGE-XR) 500 MG 24 hr tablet Take 1 tablet (500 mg total) by mouth 2 (two) times daily. Patient not taking: Reported on 06/27/2023 10/25/22   Doran Stabler, DO  nicotine (NICODERM CQ - DOSED IN MG/24 HOURS) 14 mg/24hr patch Place 1 patch (14 mg total) onto the skin daily. Patient not taking: Reported on 06/27/2023 08/04/22   Steffanie Rainwater, MD  ondansetron (ZOFRAN-ODT) 4 MG disintegrating tablet Take 1 tablet (4 mg total) by mouth every 8 (eight) hours as needed for nausea or vomiting. Patient not taking: Reported on 06/27/2023 02/28/23   Renne Crigler, PA-C  ondansetron (ZOFRAN-ODT) 4 MG disintegrating tablet Take 1 tablet (4 mg total) by mouth every 8 (eight) hours as needed for nausea or vomiting. Patient not taking: Reported on 06/27/2023 02/28/23   Mannie Stabile, PA-C      Allergies    Oxycodone    Review of Systems   Review of Systems  Eyes:  Positive for visual disturbance.  Cardiovascular:  Negative for chest pain.  Neurological:  Positive for weakness and headaches.    Physical Exam Updated Vital Signs BP 118/79 (BP Location: Right Arm)   Pulse 66   Temp 98.1 F (36.7 C)   Resp 16   Ht 1.753 m (5\' 9" )   Wt 117.9 kg   SpO2 100%   BMI 38.40 kg/m  Physical Exam CONSTITUTIONAL: Well developed/well nourished HEAD: Normocephalic/atraumatic EYES: EOMI/PERRL, no nystagmus,  no ptosis ENMT: Mucous membranes moist NECK: supple no meningeal signs, mild left cervical paraspinal tenderness CV: S1/S2 noted, no murmurs/rubs/gallops noted LUNGS: Lungs are clear to auscultation bilaterally, no apparent distress ABDOMEN: soft, nontender, no rebound or guarding NEURO:Awake/alert, face symmetric, no arm drift Decreased handgrip on the left Mild left lower extremity drift is noted Patient reports numbness throughout his left arm and left leg. Aside from  left facial numbness, no other cranial nerve deficit Gait normal without ataxia No past pointing EXTREMITIES: pulses normalx4, full ROM, no deformities SKIN: warm, color normal PSYCH: no abnormalities of mood noted  ED Results / Procedures / Treatments   Labs (all labs ordered are listed, but only abnormal results are displayed) Labs Reviewed  BASIC METABOLIC PANEL - Abnormal; Notable for the following components:      Result Value   Glucose, Bld 138 (*)    Calcium 8.3 (*)    All other components within normal limits  CBC - Abnormal; Notable for the following components:   MCH 25.8 (*)    All other components within normal limits  URINALYSIS, ROUTINE W REFLEX MICROSCOPIC - Abnormal; Notable for the following components:   Leukocytes,Ua TRACE (*)    All other components within normal limits    EKG EKG Interpretation Date/Time:  Tuesday July 23 2023 17:59:05 EDT Ventricular Rate:  79 PR Interval:  156 QRS Duration:  84 QT Interval:  384 QTC Calculation: 440 R Axis:   43  Text Interpretation: Sinus rhythm with occasional Premature ventricular complexes Otherwise normal ECG Confirmed by Zadie Rhine ([id number]) on 07/23/2023 11:56:04 PM  Radiology MR BRAIN WO CONTRAST  Result Date: 07/24/2023 CLINICAL DATA:  Recent syncopal episode. Headache with blurry vision. EXAM: MRI HEAD WITHOUT CONTRAST MRI CERVICAL SPINE WITHOUT CONTRAST TECHNIQUE: Multiplanar, multiecho pulse sequences of the brain and surrounding structures, and cervical spine, to include the craniocervical junction and cervicothoracic junction, were obtained without intravenous contrast. COMPARISON:  None Available. FINDINGS: MRI HEAD FINDINGS Brain: No acute infarct, mass effect or extra-axial collection. No acute or chronic hemorrhage. Normal white matter signal, parenchymal volume and CSF spaces. The midline structures are normal. Vascular: Major flow voids are preserved. Skull and upper cervical spine: Normal  calvarium and skull base. Visualized upper cervical spine and soft tissues are normal. Sinuses/Orbits:No paranasal sinus fluid levels or advanced mucosal thickening. No mastoid or middle ear effusion. Normal orbits. MRI CERVICAL SPINE FINDINGS Alignment: Physiologic. Vertebrae: No fracture, evidence of discitis, or bone lesion. Cord: Normal signal and morphology. Posterior Fossa, vertebral arteries, paraspinal tissues: Negative. Disc levels: C1-2: Unremarkable. C2-3: Normal disc space and facet joints. There is no spinal canal stenosis. No neural foraminal stenosis. C3-4: Normal disc space and facet joints. There is no spinal canal stenosis. No neural foraminal stenosis. C4-5: Left asymmetric disc bulge. There is no spinal canal stenosis. Moderate left neural foraminal stenosis. C5-6: Small disc bulge with bilateral uncovertebral hypertrophy. There is no spinal canal stenosis. Moderate bilateral neural foraminal stenosis. C6-7: Left asymmetric disc bulge with uncovertebral hypertrophy. There is no spinal canal stenosis. Severe left neural foraminal stenosis. C7-T1: Normal disc space and facet joints. There is no spinal canal stenosis. No neural foraminal stenosis. IMPRESSION: 1. Normal MRI of the brain. 2. 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 3. Moderate left C4-5 and bilateral C5-6 neural foraminal stenosis. 4. No spinal canal stenosis. Electronically Signed   By: Deatra Robinson M.D.   On: 07/24/2023 02:17   MR Cervical Spine Wo Contrast  Result Date: 07/24/2023 CLINICAL DATA:  Recent syncopal episode. Headache with blurry vision. EXAM: MRI HEAD WITHOUT CONTRAST MRI CERVICAL SPINE WITHOUT CONTRAST TECHNIQUE: Multiplanar, multiecho pulse sequences of the brain and surrounding structures, and cervical spine, to include the craniocervical junction and cervicothoracic junction, were obtained without intravenous contrast. COMPARISON:  None Available. FINDINGS: MRI HEAD FINDINGS Brain: No acute infarct, mass  effect or extra-axial collection. No acute or chronic hemorrhage. Normal white matter signal, parenchymal volume and CSF spaces. The midline structures are normal. Vascular: Major flow voids are preserved. Skull and upper cervical spine: Normal calvarium and skull base. Visualized upper cervical spine and soft tissues are normal. Sinuses/Orbits:No paranasal sinus fluid levels or advanced mucosal thickening. No mastoid or middle ear effusion. Normal orbits. MRI CERVICAL SPINE FINDINGS Alignment: Physiologic. Vertebrae: No fracture, evidence of discitis, or bone lesion. Cord: Normal signal and morphology. Posterior Fossa, vertebral arteries, paraspinal tissues: Negative. Disc levels: C1-2: Unremarkable. C2-3: Normal disc space and facet joints. There is no spinal canal stenosis. No neural foraminal stenosis. C3-4: Normal disc space and facet joints. There is no spinal canal stenosis. No neural foraminal stenosis. C4-5: Left asymmetric disc bulge. There is no spinal canal stenosis. Moderate left neural foraminal stenosis. C5-6: Small disc bulge with bilateral uncovertebral hypertrophy. There is no spinal canal stenosis. Moderate  bilateral neural foraminal stenosis. C6-7: Left asymmetric disc bulge with uncovertebral hypertrophy. There is no spinal canal stenosis. Severe left neural foraminal stenosis. C7-T1: Normal disc space and facet joints. There is no spinal canal stenosis. No neural foraminal stenosis. IMPRESSION: 1. Normal MRI of the brain. 2. 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 3. Moderate left C4-5 and bilateral C5-6 neural foraminal stenosis. 4. No spinal canal stenosis. Electronically Signed   By: Deatra Robinson M.D.   On: 07/24/2023 02:17   CT HEAD WO CONTRAST  Result Date: 07/23/2023 CLINICAL DATA:  Headaches, initial encounter EXAM: CT HEAD WITHOUT CONTRAST TECHNIQUE: Contiguous axial images were obtained from the base of the skull through the vertex without intravenous contrast. RADIATION DOSE  REDUCTION: This exam was performed according to the departmental dose-optimization program which includes automated exposure control, adjustment of the mA and/or kV according to patient size and/or use of iterative reconstruction technique. COMPARISON:  08/10/2005 FINDINGS: Brain: No evidence of acute infarction, hemorrhage, hydrocephalus, extra-axial collection or mass lesion/mass effect. Vascular: No hyperdense vessel or unexpected calcification. Skull: Normal. Negative for fracture or focal lesion. Sinuses/Orbits: No acute finding. Other: None. IMPRESSION: No acute intracranial abnormality noted. Electronically Signed   By: Alcide Clever M.D.   On: 07/23/2023 19:05    Procedures Procedures    Medications Ordered in ED Medications - No data to display  ED Course/ Medical Decision Making/ A&P Clinical Course as of 07/24/23 0240  Wed Jul 24, 2023  0121 Patient reports he had a syncopal episode around 3 days ago after extended heat exposure.  Since that time he had left numbness and weakness.  He has some sensory deficit on today's exam with mild decreased left hand grip with left leg weakness.  CT head reviewed and negative.  Will obtain MRI head and C-spine Overall labs are reassuring  patient will need to establish with a PCP [DW]  0239 MRI negative for acute stroke.  Patient does have foraminal stenosis which could be contributing to some of his numbness and likely radiculopathy.  Suspect his initial syncopal episode was due to heat exposure.  Patient will need to follow-up as an outpatient with a PCP to reestablish and start his medications.  Patient is safe for outpatient management [DW]    Clinical Course User Index [DW] Zadie Rhine, MD                                 Medical Decision Making Amount and/or Complexity of Data Reviewed Labs: ordered. Radiology: ordered.   This patient presents to the ED for concern of weakness/numbness, this involves an extensive number of  treatment options, and is a complaint that carries with it a high risk of complications and morbidity.  The differential diagnosis includes but is not limited to CVA, intracranial hemorrhage, acute coronary syndrome, renal failure, urinary tract infection, electrolyte disturbance, pneumonia    Comorbidities that complicate the patient evaluation: Patient's presentation is complicated by their history of hypertension, diabetes, heart failure  Social Determinants of Health: Patient's lack of prescription access and impaired access to primary care  increases the complexity of managing their presentation  Additional history obtained: Records reviewed  previous cardiology notes reviewed  Lab Tests: I Ordered, and personally interpreted labs.  The pertinent results include: Mild hyperglycemia  Imaging Studies ordered: I ordered imaging studies including CT scan head   I independently visualized and interpreted imaging which showed no acute findings  I agree with the radiologist interpretation  Reevaluation: After the interventions noted above, I reevaluated the patient and found that they have :improved  Complexity of problems addressed: Patient's presentation is most consistent with  acute presentation with potential threat to life or bodily function  Disposition: After consideration of the diagnostic results and the patient's response to treatment,  I feel that the patent would benefit from discharge   .           Final Clinical Impression(s) / ED Diagnoses Final diagnoses:  Syncope and collapse  Cervical radiculopathy    Rx / DC Orders ED Discharge Orders     None         Zadie Rhine, MD 07/24/23 [id number]

## 2023-08-30 ENCOUNTER — Encounter (HOSPITAL_COMMUNITY): Payer: Self-pay

## 2023-08-30 ENCOUNTER — Emergency Department (HOSPITAL_COMMUNITY)
Admission: EM | Admit: 2023-08-30 | Discharge: 2023-08-30 | Discharge status: Against Medical Advice | Payer: Self-pay | Attending: Emergency Medicine | Admitting: Emergency Medicine

## 2023-08-30 ENCOUNTER — Other Ambulatory Visit: Payer: Self-pay

## 2023-08-30 DIAGNOSIS — M79652 Pain in left thigh: Secondary | ICD-10-CM | POA: Insufficient documentation

## 2023-08-30 DIAGNOSIS — R1010 Upper abdominal pain, unspecified: Secondary | ICD-10-CM | POA: Insufficient documentation

## 2023-08-30 DIAGNOSIS — Z5329 Procedure and treatment not carried out because of patient's decision for other reasons: Secondary | ICD-10-CM | POA: Insufficient documentation

## 2023-08-30 DIAGNOSIS — Z794 Long term (current) use of insulin: Secondary | ICD-10-CM | POA: Insufficient documentation

## 2023-08-30 LAB — CBC
HCT: 44 % (ref 39.0–52.0)
Hemoglobin: 14 g/dL (ref 13.0–17.0)
MCH: 27.2 pg (ref 26.0–34.0)
MCHC: 31.8 g/dL (ref 30.0–36.0)
MCV: 85.4 fL (ref 80.0–100.0)
Platelets: 251 10*3/uL (ref 150–400)
RBC: 5.15 MIL/uL (ref 4.22–5.81)
RDW: 14.1 % (ref 11.5–15.5)
WBC: 7.5 10*3/uL (ref 4.0–10.5)
nRBC: 0 % (ref 0.0–0.2)

## 2023-08-30 LAB — COMPREHENSIVE METABOLIC PANEL
ALT: 19 U/L (ref 0–44)
AST: 17 U/L (ref 15–41)
Albumin: 3.3 g/dL — ABNORMAL LOW (ref 3.5–5.0)
Alkaline Phosphatase: 68 U/L (ref 38–126)
Anion gap: 9 (ref 5–15)
BUN: 13 mg/dL (ref 6–20)
CO2: 22 mmol/L (ref 22–32)
Calcium: 8.7 mg/dL — ABNORMAL LOW (ref 8.9–10.3)
Chloride: 107 mmol/L (ref 98–111)
Creatinine, Ser: 0.99 mg/dL (ref 0.61–1.24)
GFR, Estimated: >60 mL/min (ref >60)
Glucose, Bld: 172 mg/dL — ABNORMAL HIGH (ref 70–99)
Potassium: 3.6 mmol/L (ref 3.5–5.1)
Sodium: 138 mmol/L (ref 135–145)
Total Bilirubin: 0.4 mg/dL (ref 0.3–1.2)
Total Protein: 5.8 g/dL — ABNORMAL LOW (ref 6.5–8.1)

## 2023-08-30 LAB — LIPASE, BLOOD: Lipase: 45 U/L (ref 11–51)

## 2023-08-30 MED ORDER — FAMOTIDINE 20 MG PO TABS
20.0000 mg | ORAL_TABLET | Freq: Once | ORAL | Status: AC
  Administered 2023-08-30: 20 mg via ORAL
  Filled 2023-08-30: qty 1

## 2023-08-30 MED ORDER — ALUM & MAG HYDROXIDE-SIMETH 200-200-20 MG/5ML PO SUSP
30.0000 mL | Freq: Once | ORAL | Status: AC
  Administered 2023-08-30: 30 mL via ORAL
  Filled 2023-08-30: qty 30

## 2023-08-30 MED ORDER — ONDANSETRON 4 MG PO TBDP
8.0000 mg | ORAL_TABLET | Freq: Once | ORAL | Status: AC
  Administered 2023-08-30: 8 mg via ORAL
  Filled 2023-08-30: qty 2

## 2023-08-30 MED ORDER — ACETAMINOPHEN 500 MG PO TABS
1000.0000 mg | ORAL_TABLET | Freq: Once | ORAL | Status: AC
  Administered 2023-08-30: 1000 mg via ORAL
  Filled 2023-08-30: qty 2

## 2023-08-30 NOTE — ED Provider Notes (Signed)
[place] EMERGENCY DEPARTMENT AT [facility] Provider Note   CSN: [id number] Arrival date & time: 08/30/23  1246     History {Add pertinent medical, surgical, social history, OB history to HPI:1} Chief Complaint  Patient presents with   Abdominal Pain    Logan Lucas is a 47 y.o. male.  Patient c/o upper abdominal pain in the past week. Dull pain, upper abd, non radiating, without specific exacerbating or alleviating factors.  Intermittent nausea, no vomiting. Having normal bms. No abd distension. No lower abd pain. No scrotal or testicular pain. No back/flank pain. No dysuria, hematuria or gu c/o. No chest pain or sob. Denies cough or fever.  Denies hx pancreatitis, pud or gallstones. Also notes intermittent sharp shooting pain to left thigh, feels occasionally at night, lasts seconds. No leg swelling, skin changes or rash. No low back pain or radicular pain. No associated numbness/weakness. No current pain to area.   The history is provided by the patient and medical records.  Abdominal Pain Associated symptoms: no chest pain, no constipation, no cough, no diarrhea, no dysuria, no fever, no hematuria, no nausea, no shortness of breath and no vomiting        Home Medications Prior to Admission medications   Medication Sig Start Date End Date Taking? Authorizing Provider  TRUEplus Lancets 28G MISC Check your blood sugar before eating each morning and before each meal. 06/27/23   Linwood Dibbles, MD  atorvastatin (LIPITOR) 80 MG tablet Take 1 tablet (80 mg total) by mouth daily. Patient not taking: Reported on 06/27/2023 10/15/22   Chauncey Mann, DO  blood glucose meter kit and supplies KIT Check your blood sugar before eating each morning and before each meal. 06/27/23   Linwood Dibbles, MD  dicyclomine (BENTYL) 20 MG tablet Take 1 tablet (20 mg total) by mouth 2 (two) times daily. Patient not taking: Reported on 06/27/2023 02/28/23   Mannie Stabile, PA-C  empagliflozin  (JARDIANCE) 10 MG TABS tablet Take 1 tablet (10 mg total) by mouth daily. Patient not taking: Reported on 06/27/2023 10/15/22   Atway, Rayann N, DO  famotidine (PEPCID) 20 MG tablet Take 1 tablet (20 mg total) by mouth daily. Patient not taking: Reported on 06/27/2023 05/28/22   Steffanie Rainwater, MD  fenofibrate 54 MG tablet Take 1 tablet (54 mg total) by mouth daily. Patient not taking: Reported on 06/27/2023 10/15/22   Atway, Rayann N, DO  gabapentin (NEURONTIN) 300 MG capsule Take 1 capsule (300 mg total) by mouth 3 (three) times daily. 06/27/23   Linwood Dibbles, MD  glucose blood test strip Check your blood sugar before eating each morning and before each meal. 06/27/23   Linwood Dibbles, MD  HYDROmorphone (DILAUDID) 2 MG tablet Take 1 tablet (2 mg total) by mouth every 6 (six) hours as needed for severe pain. Patient not taking: Reported on 06/27/2023 08/03/22   Steffanie Rainwater, MD  hydrOXYzine (ATARAX) 25 MG tablet Take 1 tablet (25 mg total) by mouth 3 (three) times daily as needed. Patient not taking: Reported on 06/27/2023 06/05/22   Doran Stabler, DO  insulin aspart (NOVOLOG) 100 UNIT/ML FlexPen Inject 10 Units into the skin 3 (three) times daily with meals. Patient not taking: Reported on 06/27/2023 06/05/22   Doran Stabler, DO  insulin glargine (LANTUS) 100 UNIT/ML Solostar Pen Inject 25 Units into the skin daily. Patient not taking: Reported on 06/27/2023 06/05/22   Doran Stabler, DO  Insulin Pen Needle (PENTIPS) 31G X 8  MM MISC Check your blood sugar before eating each morning and before each meal. Patient not taking: Reported on 06/27/2023 06/21/22   Doran Stabler, DO  Lancet Devices (TRUEDRAW LANCING DEVICE) MISC Use as directed 06/27/23   Linwood Dibbles, MD  loperamide (IMODIUM) 2 MG capsule Take 1 capsule (2 mg total) by mouth 4 (four) times daily as needed for diarrhea or loose stools. Patient not taking: Reported on 06/27/2023 02/28/23   Renne Crigler, PA-C  losartan (COZAAR) 25 MG tablet Take 1 tablet (25 mg total)  by mouth daily. Patient not taking: Reported on 06/27/2023 01/26/21 08/01/22  Wouk, Wilfred Curtis, MD  melatonin 3 MG TABS tablet Take 1 tablet (3 mg total) by mouth at bedtime as needed. Patient not taking: Reported on 06/27/2023 05/27/22   Steffanie Rainwater, MD  metFORMIN (GLUCOPHAGE-XR) 500 MG 24 hr tablet Take 1 tablet (500 mg total) by mouth 2 (two) times daily. Patient not taking: Reported on 06/27/2023 10/25/22   Doran Stabler, DO  nicotine (NICODERM CQ - DOSED IN MG/24 HOURS) 14 mg/24hr patch Place 1 patch (14 mg total) onto the skin daily. Patient not taking: Reported on 06/27/2023 08/04/22   Steffanie Rainwater, MD  ondansetron (ZOFRAN-ODT) 4 MG disintegrating tablet Take 1 tablet (4 mg total) by mouth every 8 (eight) hours as needed for nausea or vomiting. Patient not taking: Reported on 06/27/2023 02/28/23   Renne Crigler, PA-C  ondansetron (ZOFRAN-ODT) 4 MG disintegrating tablet Take 1 tablet (4 mg total) by mouth every 8 (eight) hours as needed for nausea or vomiting. Patient not taking: Reported on 06/27/2023 02/28/23   Mannie Stabile, PA-C      Allergies    Oxycodone    Review of Systems   Review of Systems  Constitutional:  Negative for fever.  Respiratory:  Negative for cough and shortness of breath.   Cardiovascular:  Negative for chest pain.  Gastrointestinal:  Positive for abdominal pain. Negative for blood in stool, constipation, diarrhea, nausea and vomiting.  Genitourinary:  Negative for dysuria, flank pain, hematuria and testicular pain.  Musculoskeletal:  Negative for back pain and neck pain.  Skin:  Negative for rash.  Neurological:  Negative for weakness, numbness and headaches.  Hematological:  Does not bruise/bleed easily.  Psychiatric/Behavioral:  Negative for confusion.     Physical Exam Updated Vital Signs BP (!) 126/92 (BP Location: Left Arm)   Pulse 65   Temp 97.8 F (36.6 C)   Resp 16   Ht 1.753 m (5\' 9" )   Wt 117.9 kg   SpO2 100%   BMI 38.38 kg/m   Physical Exam Vitals and nursing note reviewed.  Constitutional:      Appearance: Normal appearance. He is well-developed.  HENT:     Head: Atraumatic.     Nose: Nose normal.     Mouth/Throat:     Mouth: Mucous membranes are moist.  Eyes:     General: No scleral icterus.    Conjunctiva/sclera: Conjunctivae normal.  Neck:     Trachea: No tracheal deviation.  Cardiovascular:     Rate and Rhythm: Normal rate and regular rhythm.     Pulses: Normal pulses.     Heart sounds: Normal heart sounds. No murmur heard.    No friction rub. No gallop.  Pulmonary:     Effort: Pulmonary effort is normal. No accessory muscle usage or respiratory distress.     Breath sounds: Normal breath sounds.  Abdominal:     General: Bowel sounds are  normal. There is no distension.     Palpations: Abdomen is soft. There is no mass.     Tenderness: There is no abdominal tenderness. There is no guarding.     Hernia: No hernia is present.  Genitourinary:    Comments: No cva tenderness. Musculoskeletal:        General: No swelling or tenderness.     Cervical back: Normal range of motion.     Comments: Good rom left hip, knee without pain. Fem and distal pulses palp. No sts noted. No LLE edema.   Skin:    General: Skin is warm and dry.     Findings: No rash.  Neurological:     Mental Status: He is alert.     Comments: Alert, speech clear. Motor/sens grossly intact bil. Steady gait.   Psychiatric:        Mood and Affect: Mood normal.     ED Results / Procedures / Treatments   Labs (all labs ordered are listed, but only abnormal results are displayed) Results for orders placed or performed during the hospital encounter of 08/30/23  Lipase, blood  Result Value Ref Range   Lipase 45 11 - 51 U/L  Comprehensive metabolic panel  Result Value Ref Range   Sodium 138 135 - 145 mmol/L   Potassium 3.6 3.5 - 5.1 mmol/L   Chloride 107 98 - 111 mmol/L   CO2 22 22 - 32 mmol/L   Glucose, Bld 172 (H) 70 - 99  mg/dL   BUN 13 6 - 20 mg/dL   Creatinine, Ser 1.61 0.61 - 1.24 mg/dL   Calcium 8.7 (L) 8.9 - 10.3 mg/dL   Total Protein 5.8 (L) 6.5 - 8.1 g/dL   Albumin 3.3 (L) 3.5 - 5.0 g/dL   AST 17 15 - 41 U/L   ALT 19 0 - 44 U/L   Alkaline Phosphatase 68 38 - 126 U/L   Total Bilirubin 0.4 0.3 - 1.2 mg/dL   GFR, Estimated >09 >60 mL/min   Anion gap 9 5 - 15  CBC  Result Value Ref Range   WBC 7.5 4.0 - 10.5 K/uL   RBC 5.15 4.22 - 5.81 MIL/uL   Hemoglobin 14.0 13.0 - 17.0 g/dL   HCT 45.4 09.8 - 11.9 %   MCV 85.4 80.0 - 100.0 fL   MCH 27.2 26.0 - 34.0 pg   MCHC 31.8 30.0 - 36.0 g/dL   RDW 14.7 82.9 - 56.2 %   Platelets 251 150 - 400 K/uL   nRBC 0.0 0.0 - 0.2 %      EKG None  Radiology No results found.  Procedures Procedures  {Document cardiac monitor, telemetry assessment procedure when appropriate:1}  Medications Ordered in ED Medications  ondansetron (ZOFRAN-ODT) disintegrating tablet 8 mg (8 mg Oral Given 08/30/23 1858)  alum & mag hydroxide-simeth (MAALOX/MYLANTA) 200-200-20 MG/5ML suspension 30 mL (30 mLs Oral Given 08/30/23 1856)  famotidine (PEPCID) tablet 20 mg (20 mg Oral Given 08/30/23 1857)  acetaminophen (TYLENOL) tablet 1,000 mg (1,000 mg Oral Given 08/30/23 1856)    ED Course/ Medical Decision Making/ A&P   {   Click here for ABCD2, HEART and other calculatorsREFRESH Note before signing :1}                              Medical Decision Making Amount and/or Complexity of Data Reviewed Labs: ordered.  Risk OTC drugs. Prescription drug management.  Labs ordered/sent.  Differential diagnosis includes pancreatitis, pud, gastritis, etc. Dispo decision including potential need for admission considered - will get labs and imaging and reassess.   Reviewed nursing notes and prior charts for additional history. External reports reviewed.   Labs reviewed/interpreted by me - wbc and hgb normal. Lipase normal. Lytes normal.   Acetaminophen, pepcid, maalox for symptom  relief.   Prior CT reviewed/interpreted by me - several cts with no acute abd process.   Given recurrent upper abd pain, several prior cts for abd pain with acute upper abd process, etc, ?possible CHS as cause of recurrent pain. Also possible gastritis, other causes.   Abd exam benign, soft, non tender. Vitals normal.  Pt currently appears stable for d/c.   Rec close pcp f/u.  Return precautions provided.     {Document critical care time when appropriate:1} {Document review of labs and clinical decision tools ie heart score, Chads2Vasc2 etc:1}  {Document your independent review of radiology images, and any outside records:1} {Document your discussion with family members, caretakers, and with consultants:1} {Document social determinants of health affecting pt's care:1} {Document your decision making why or why not admission, treatments were needed:1} Final Clinical Impression(s) / ED Diagnoses Final diagnoses:  None    Rx / DC Orders ED Discharge Orders     None

## 2023-08-30 NOTE — ED Triage Notes (Signed)
Pt c/o UQ, LUQ abdominal painx3-4d. Pt denies N/V/D. Pt c/o leg spasms of left upper leg that wakes him up at nightx1wk

## 2023-09-11 ENCOUNTER — Other Ambulatory Visit: Payer: Self-pay

## 2023-09-11 ENCOUNTER — Emergency Department (HOSPITAL_COMMUNITY)
Admission: EM | Admit: 2023-09-11 | Discharge: 2023-09-12 | Disposition: A | Discharge status: Home / Self Care | Payer: Self-pay | Attending: Emergency Medicine | Admitting: Emergency Medicine

## 2023-09-11 DIAGNOSIS — M791 Myalgia, unspecified site: Secondary | ICD-10-CM | POA: Insufficient documentation

## 2023-09-11 DIAGNOSIS — Y990 Civilian activity done for income or pay: Secondary | ICD-10-CM | POA: Insufficient documentation

## 2023-09-11 DIAGNOSIS — S61210A Laceration without foreign body of right index finger without damage to nail, initial encounter: Secondary | ICD-10-CM | POA: Insufficient documentation

## 2023-09-11 DIAGNOSIS — Z20822 Contact with and (suspected) exposure to covid-19: Secondary | ICD-10-CM | POA: Insufficient documentation

## 2023-09-11 DIAGNOSIS — S61219A Laceration without foreign body of unspecified finger without damage to nail, initial encounter: Secondary | ICD-10-CM

## 2023-09-11 DIAGNOSIS — R051 Acute cough: Secondary | ICD-10-CM | POA: Insufficient documentation

## 2023-09-11 DIAGNOSIS — W268XXA Contact with other sharp object(s), not elsewhere classified, initial encounter: Secondary | ICD-10-CM | POA: Insufficient documentation

## 2023-09-11 DIAGNOSIS — R55 Syncope and collapse: Secondary | ICD-10-CM | POA: Insufficient documentation

## 2023-09-11 NOTE — ED Triage Notes (Signed)
Patient reports injury/laceration  to right distal index finger this evening , he accidentally hit it against a metal at work with bleeding. He adds brief syncopal episode last Monday with low back pain .

## 2023-09-12 ENCOUNTER — Emergency Department (HOSPITAL_COMMUNITY): Payer: Self-pay

## 2023-09-12 LAB — COMPREHENSIVE METABOLIC PANEL
ALT: 20 U/L (ref 0–44)
AST: 24 U/L (ref 15–41)
Albumin: 3.3 g/dL — ABNORMAL LOW (ref 3.5–5.0)
Alkaline Phosphatase: 75 U/L (ref 38–126)
Anion gap: 10 (ref 5–15)
BUN: 12 mg/dL (ref 6–20)
CO2: 21 mmol/L — ABNORMAL LOW (ref 22–32)
Calcium: 8.7 mg/dL — ABNORMAL LOW (ref 8.9–10.3)
Chloride: 107 mmol/L (ref 98–111)
Creatinine, Ser: 1.04 mg/dL (ref 0.61–1.24)
GFR, Estimated: >60 mL/min (ref >60)
Glucose, Bld: 152 mg/dL — ABNORMAL HIGH (ref 70–99)
Potassium: 4 mmol/L (ref 3.5–5.1)
Sodium: 138 mmol/L (ref 135–145)
Total Bilirubin: 0.3 mg/dL (ref 0.3–1.2)
Total Protein: 5.8 g/dL — ABNORMAL LOW (ref 6.5–8.1)

## 2023-09-12 LAB — CBC WITH DIFFERENTIAL/PLATELET
Abs Immature Granulocytes: 0.03 10*3/uL (ref 0.00–0.07)
Basophils Absolute: 0 10*3/uL (ref 0.0–0.1)
Basophils Relative: 0 %
Eosinophils Absolute: 0.5 10*3/uL (ref 0.0–0.5)
Eosinophils Relative: 4 %
HCT: 45.9 % (ref 39.0–52.0)
Hemoglobin: 14.8 g/dL (ref 13.0–17.0)
Immature Granulocytes: 0 %
Lymphocytes Relative: 28 %
Lymphs Abs: 3 10*3/uL (ref 0.7–4.0)
MCH: 27.1 pg (ref 26.0–34.0)
MCHC: 32.2 g/dL (ref 30.0–36.0)
MCV: 84.1 fL (ref 80.0–100.0)
Monocytes Absolute: 0.8 10*3/uL (ref 0.1–1.0)
Monocytes Relative: 7 %
Neutro Abs: 6.5 10*3/uL (ref 1.7–7.7)
Neutrophils Relative %: 61 %
Platelets: 279 10*3/uL (ref 150–400)
RBC: 5.46 MIL/uL (ref 4.22–5.81)
RDW: 14.4 % (ref 11.5–15.5)
WBC: 10.7 10*3/uL — ABNORMAL HIGH (ref 4.0–10.5)
nRBC: 0 % (ref 0.0–0.2)

## 2023-09-12 LAB — RESP PANEL BY RT-PCR (RSV, FLU A&B, COVID)  RVPGX2
Influenza A by PCR: NEGATIVE
Influenza B by PCR: NEGATIVE
Resp Syncytial Virus by PCR: NEGATIVE
SARS Coronavirus 2 by RT PCR: NEGATIVE

## 2023-09-12 MED ORDER — IBUPROFEN 600 MG PO TABS: 600.0000 mg | ORAL_TABLET | Freq: Four times a day (QID) | ORAL | 0 refills | Status: DC | PRN

## 2023-09-12 MED ORDER — BENZONATATE 100 MG PO CAPS: 100.0000 mg | ORAL_CAPSULE | Freq: Two times a day (BID) | ORAL | 0 refills | Status: DC | PRN

## 2023-09-12 NOTE — ED Provider Notes (Signed)
[facility]-EMERGENCY DEPT [facility] Emergency Department Provider Note MRN:  [id number]  Arrival date & time: 09/13/23     Chief Complaint   Finger Laceration    History of Present Illness   Logan Lucas is a 47 y.o. year-old male presents to the ED with chief complaint of finger laceration.  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  He also reports that he had a syncopal episode 4 days ago and complains of generalized body aches.  He denies fever, but has had chills.  He says that he hurts all over.  History provided by patient.   Review of Systems  Pertinent positive and negative review of systems noted in HPI.    Physical Exam   Vitals:   09/12/23 0309 09/12/23 0530  BP: 135/64 138/85  Pulse: 83 73  Resp: 18 19  Temp: 98.2 F (36.8 C)   SpO2: 99% 96%    CONSTITUTIONAL:  well-appearing, NAD NEURO:  Alert and oriented x 3, CN 3-12 grossly intact EYES:  eyes equal and reactive ENT/NECK:  Supple, no stridor  CARDIO:  normal rate, regular rhythm, appears well-perfused  PULM:  No respiratory distress, CTAB GI/GU:  non-distended,  MSK/SPINE:  No gross deformities, no edema, moves all extremities  SKIN:  no rash, atraumatic   *Additional and/or pertinent findings included in MDM below  Diagnostic and Interventional Summary    EKG Interpretation Date/Time:  Wednesday September 11 2023 23:41:23 EDT Ventricular Rate:  86 PR Interval:  144 QRS Duration:  74 QT Interval:  360 QTC Calculation: 430 R Axis:   60  Text Interpretation: Normal sinus rhythm T wave abnormality, consider inferior ischemia Confirmed by Nicanor Alcon, April ([id number]) on 09/12/2023 9:12:04 AM       Labs Reviewed  CBC WITH DIFFERENTIAL/PLATELET - Abnormal; Notable for the following components:      Result Value   WBC 10.7 (*)    All other components within normal limits  COMPREHENSIVE METABOLIC PANEL - Abnormal; Notable for the following components:   CO2 21 (*)     Glucose, Bld 152 (*)    Calcium 8.7 (*)    Total Protein 5.8 (*)    Albumin 3.3 (*)    All other components within normal limits  RESP PANEL BY RT-PCR (RSV, FLU A&B, COVID)  [id number]    DG Finger Index Right  Final Result      Medications - No data to display   Procedures  /  Critical Care Procedures  ED Course and Medical Decision Making  I have reviewed the triage vital signs, the nursing notes, and pertinent available records from the EMR.  Social Determinants Affecting Complexity of Care: Patient has no clinically significant social determinants affecting this chief complaint..   ED Course: Clinical Course as of 09/13/23 0249  Fri Sep 13, 2023  0246 Resp panel by RT-PCR (RSV, Flu A&B, Covid) Anterior Nasal Swab negative [RB]  0247 CBC with Differential(!) Non specific leukocytosis [RB]  0247 Comprehensive metabolic panel(!) No significant electrolyte abnormality [RB]  0247 DG Finger Index Right No fracture [RB]    Clinical Course User Index [RB] Roxy Horseman, PA-C    Medical Decision Making Patient here with generalized myalgias, finger laceration that is very minor and doesn't require any repair, and reported syncopal even several days ago.    Labs and imaging ordered.    VSS.  EKG reassuring.    Patient appears stable for outpatient follow-up.  Return precautions given.  Amount and/or Complexity of Data Reviewed Labs: ordered. Decision-making details documented in ED Course. Radiology: ordered and independent interpretation performed.    Details: No fracture ECG/medicine tests: ordered.  Risk Prescription drug management.         Consultants: No consultations were needed in caring for this patient.   Treatment and Plan: Emergency department workup does not suggest an emergent condition requiring admission or immediate intervention beyond  what has been performed at this time. The patient is safe for discharge and has  been instructed to  return immediately for worsening symptoms, change in  symptoms or any other concerns    Final Clinical Impressions(s) / ED Diagnoses     ICD-10-CM   1. Laceration of finger of right hand, foreign body presence unspecified, nail damage status unspecified, unspecified finger, initial encounter  S61.219A     2. Myalgia  M79.10     3. Acute cough  R05.1       ED Discharge Orders          Ordered    benzonatate (TESSALON) 100 MG capsule  2 times daily PRN        09/12/23 0639    ibuprofen (ADVIL) 600 MG tablet  Every 6 hours PRN        09/12/23 0102              Discharge Instructions Discussed with and Provided to Patient:   Discharge Instructions   None      Roxy Horseman, PA-C 09/13/23 0249    Gilda Crease, MD 09/13/23 [phone]

## 2023-09-17 ENCOUNTER — Other Ambulatory Visit: Payer: Self-pay

## 2023-09-17 ENCOUNTER — Encounter (HOSPITAL_COMMUNITY): Payer: Self-pay

## 2023-09-17 ENCOUNTER — Emergency Department (HOSPITAL_COMMUNITY)
Admission: EM | Admit: 2023-09-17 | Discharge: 2023-09-18 | Disposition: A | Discharge status: Home / Self Care | Payer: Self-pay | Attending: Emergency Medicine | Admitting: Emergency Medicine

## 2023-09-17 DIAGNOSIS — X500XXA Overexertion from strenuous movement or load, initial encounter: Secondary | ICD-10-CM | POA: Insufficient documentation

## 2023-09-17 DIAGNOSIS — R051 Acute cough: Secondary | ICD-10-CM | POA: Insufficient documentation

## 2023-09-17 DIAGNOSIS — S93401A Sprain of unspecified ligament of right ankle, initial encounter: Secondary | ICD-10-CM | POA: Insufficient documentation

## 2023-09-17 DIAGNOSIS — Z20822 Contact with and (suspected) exposure to covid-19: Secondary | ICD-10-CM | POA: Insufficient documentation

## 2023-09-17 NOTE — ED Triage Notes (Signed)
Reports stepping off of a curb and rolling his right ankle.   Also c/o cough with productive cough.

## 2023-09-18 ENCOUNTER — Emergency Department (HOSPITAL_COMMUNITY): Payer: Self-pay

## 2023-09-18 ENCOUNTER — Emergency Department (HOSPITAL_COMMUNITY)
Admission: EM | Admit: 2023-09-18 | Discharge: 2023-09-19 | Disposition: A | Discharge status: Home / Self Care | Payer: Self-pay | Attending: Emergency Medicine | Admitting: Emergency Medicine

## 2023-09-18 DIAGNOSIS — E119 Type 2 diabetes mellitus without complications: Secondary | ICD-10-CM | POA: Insufficient documentation

## 2023-09-18 DIAGNOSIS — Z7984 Long term (current) use of oral hypoglycemic drugs: Secondary | ICD-10-CM | POA: Insufficient documentation

## 2023-09-18 DIAGNOSIS — I10 Essential (primary) hypertension: Secondary | ICD-10-CM | POA: Insufficient documentation

## 2023-09-18 DIAGNOSIS — M542 Cervicalgia: Secondary | ICD-10-CM | POA: Insufficient documentation

## 2023-09-18 DIAGNOSIS — Z79899 Other long term (current) drug therapy: Secondary | ICD-10-CM | POA: Insufficient documentation

## 2023-09-18 DIAGNOSIS — J45909 Unspecified asthma, uncomplicated: Secondary | ICD-10-CM | POA: Insufficient documentation

## 2023-09-18 DIAGNOSIS — W0110XA Fall on same level from slipping, tripping and stumbling with subsequent striking against unspecified object, initial encounter: Secondary | ICD-10-CM | POA: Insufficient documentation

## 2023-09-18 DIAGNOSIS — S0990XA Unspecified injury of head, initial encounter: Secondary | ICD-10-CM | POA: Insufficient documentation

## 2023-09-18 DIAGNOSIS — Z794 Long term (current) use of insulin: Secondary | ICD-10-CM | POA: Insufficient documentation

## 2023-09-18 LAB — CBG MONITORING, ED: Glucose-Capillary: 100 mg/dL — ABNORMAL HIGH (ref 70–99)

## 2023-09-18 LAB — RESP PANEL BY RT-PCR (RSV, FLU A&B, COVID)  RVPGX2
Influenza A by PCR: NEGATIVE
Influenza B by PCR: NEGATIVE
Resp Syncytial Virus by PCR: NEGATIVE
SARS Coronavirus 2 by RT PCR: NEGATIVE

## 2023-09-18 MED ORDER — BENZONATATE 100 MG PO CAPS: 100.0000 mg | ORAL_CAPSULE | Freq: Two times a day (BID) | ORAL | 0 refills | Status: DC | PRN

## 2023-09-18 MED ORDER — AZITHROMYCIN 250 MG PO TABS: 250.0000 mg | ORAL_TABLET | Freq: Every day | ORAL | 0 refills | Status: DC

## 2023-09-18 MED ORDER — BENZONATATE 100 MG PO CAPS
100.0000 mg | ORAL_CAPSULE | Freq: Once | ORAL | Status: AC
  Administered 2023-09-18: 100 mg via ORAL
  Filled 2023-09-18: qty 1

## 2023-09-18 MED ORDER — AZITHROMYCIN 250 MG PO TABS
500.0000 mg | ORAL_TABLET | Freq: Once | ORAL | Status: AC
  Administered 2023-09-18: 500 mg via ORAL
  Filled 2023-09-18: qty 2

## 2023-09-18 NOTE — Progress Notes (Signed)
Orthopedic Tech Progress Note Patient Details:  Logan Lucas Jan 01, 1976 [id number]  Ortho Devices Type of Ortho Device: ASO, Crutches Ortho Device/Splint Location: RLE Ortho Device/Splint Interventions: Ordered, Application, Adjustment   Post Interventions Patient Tolerated: Well Instructions Provided: Adjustment of device, Care of device ASO applied, patient unable to stay awake for instructions on brace application and crutch training. Crutches left at bedside and set for patients height. Darleen Crocker 09/18/2023, 4:29 AM

## 2023-09-18 NOTE — ED Triage Notes (Signed)
The pt fell earlier today and struck the back of his head and he reports that he was knocked out for awhile and he has pain in the back of his head and in his neck  he also injured his  rt ankle yesterday and he was seen here

## 2023-09-18 NOTE — ED Provider Notes (Signed)
[facility]-EMERGENCY DEPT [facility] Emergency Department Provider Note MRN:  [id number]  Arrival date & time: 09/18/23     Chief Complaint   Ankle Injury and Cough   History of Present Illness   Logan Lucas is a 47 y.o. year-old male presents to the ED with chief complaint of right ankle pain.  States that he rolled his ankle while stepping off a curb today.  Reports pain with walking, but is able to walk.  No treatments PTA.  Also reports cough x 1 week.  Denies fevers or chills.  No successful treatments PTA.  History provided by patient.   Review of Systems  Pertinent positive and negative review of systems noted in HPI.    Physical Exam   Vitals:   09/17/23 2351  BP: 132/81  Pulse: 88  Resp: 17  Temp: 98.1 F (36.7 C)  SpO2: 94%    CONSTITUTIONAL:  well-appearing, NAD NEURO:  Alert and oriented x 3, CN 3-12 grossly intact EYES:  eyes equal and reactive ENT/NECK:  Supple, no stridor  CARDIO:  normal rate, regular rhythm, appears well-perfused, intact DP/PT pulses PULM:  No respiratory distress, CTAB GI/GU:  non-distended,  MSK/SPINE:  No gross deformities, no edema, moves all extremities, mild TTP over medial and lateral right ankle without significant swelling or bony deformity SKIN:  no rash, atraumatic   *Additional and/or pertinent findings included in MDM below  Diagnostic and Interventional Summary    EKG Interpretation Date/Time:    Ventricular Rate:    PR Interval:    QRS Duration:    QT Interval:    QTC Calculation:   R Axis:      Text Interpretation:         Labs Reviewed  RESP PANEL BY RT-PCR (RSV, FLU A&B, COVID)  [id number]    DG Ankle Complete Right  Final Result    DG Chest 2 View  Final Result      Medications  benzonatate (TESSALON) capsule 100 mg (100 mg Oral Given 09/18/23 0408)  azithromycin (ZITHROMAX) tablet 500 mg (500 mg Oral Given 09/18/23 0408)     Procedures  /  Critical Care Procedures  ED Course and  Medical Decision Making  I have reviewed the triage vital signs, the nursing notes, and pertinent available records from the EMR.  Social Determinants Affecting Complexity of Care: Patient has no clinically significant social determinants affecting this chief complaint..   ED Course: Clinical Course as of 09/18/23 0456  Wed Sep 18, 2023  0454 DG Ankle Complete Right No fracture or dislocation [RB]  0455 Resp panel by RT-PCR (RSV, Flu A&B, Covid) Anterior Nasal Swab Negative RVP [RB]    Clinical Course User Index [RB] Roxy Horseman, PA-C    Medical Decision Making Patient here with ankle pain.  X-rays negative.  Likely sprained.  Will give crutches and ankle ASO.  CXR ordered for cough.  Questionable infiltrate.  Will give azithro.  Amount and/or Complexity of Data Reviewed Radiology: ordered.  Risk Prescription drug management.         Consultants: No consultations were needed in caring for this patient.   Treatment and Plan: Emergency department workup does not suggest an emergent condition requiring admission or immediate intervention beyond  what has been performed at this time. The patient is safe for discharge and has  been instructed to return immediately for worsening symptoms, change in  symptoms or any other concerns    Final Clinical Impressions(s) / ED Diagnoses  ICD-10-CM   1. Sprain of right ankle, unspecified ligament, initial encounter  S93.401A     2. Acute cough  R05.1       ED Discharge Orders          Ordered    azithromycin (ZITHROMAX) 250 MG tablet  Daily        09/18/23 0451    benzonatate (TESSALON) 100 MG capsule  2 times daily PRN        09/18/23 0451              Discharge Instructions Discussed with and Provided to Patient:   Discharge Instructions   None      Roxy Horseman, PA-C 09/18/23 0456    Sabas Sous, MD 09/18/23 [phone]

## 2023-09-19 ENCOUNTER — Emergency Department (HOSPITAL_COMMUNITY): Payer: Self-pay

## 2023-09-19 ENCOUNTER — Other Ambulatory Visit: Payer: Self-pay

## 2023-09-19 ENCOUNTER — Encounter (HOSPITAL_COMMUNITY): Payer: Self-pay | Admitting: *Deleted

## 2023-09-19 LAB — CBC WITH DIFFERENTIAL/PLATELET
Abs Immature Granulocytes: 0.02 10*3/uL (ref 0.00–0.07)
Basophils Absolute: 0 10*3/uL (ref 0.0–0.1)
Basophils Relative: 0 %
Eosinophils Absolute: 0.4 10*3/uL (ref 0.0–0.5)
Eosinophils Relative: 4 %
HCT: 44.6 % (ref 39.0–52.0)
Hemoglobin: 14.1 g/dL (ref 13.0–17.0)
Immature Granulocytes: 0 %
Lymphocytes Relative: 33 %
Lymphs Abs: 2.9 10*3/uL (ref 0.7–4.0)
MCH: 26.4 pg (ref 26.0–34.0)
MCHC: 31.6 g/dL (ref 30.0–36.0)
MCV: 83.4 fL (ref 80.0–100.0)
Monocytes Absolute: 0.6 10*3/uL (ref 0.1–1.0)
Monocytes Relative: 7 %
Neutro Abs: 4.8 10*3/uL (ref 1.7–7.7)
Neutrophils Relative %: 56 %
Platelets: 260 10*3/uL (ref 150–400)
RBC: 5.35 MIL/uL (ref 4.22–5.81)
RDW: 14.2 % (ref 11.5–15.5)
WBC: 8.7 10*3/uL (ref 4.0–10.5)
nRBC: 0 % (ref 0.0–0.2)

## 2023-09-19 LAB — BASIC METABOLIC PANEL
Anion gap: 8 (ref 5–15)
BUN: 9 mg/dL (ref 6–20)
CO2: 22 mmol/L (ref 22–32)
Calcium: 8.6 mg/dL — ABNORMAL LOW (ref 8.9–10.3)
Chloride: 108 mmol/L (ref 98–111)
Creatinine, Ser: 0.97 mg/dL (ref 0.61–1.24)
GFR, Estimated: >60 mL/min (ref >60)
Glucose, Bld: 108 mg/dL — ABNORMAL HIGH (ref 70–99)
Potassium: 3.6 mmol/L (ref 3.5–5.1)
Sodium: 138 mmol/L (ref 135–145)

## 2023-09-19 MED ORDER — ACETAMINOPHEN 325 MG PO TABS
650.0000 mg | ORAL_TABLET | Freq: Once | ORAL | Status: AC
  Administered 2023-09-19: 650 mg via ORAL
  Filled 2023-09-19: qty 2

## 2023-09-19 NOTE — ED Provider Triage Note (Signed)
Emergency Medicine Provider Triage Evaluation Note  Logan Lucas , a 47 y.o. male  was evaluated in triage.  Pt complains of fall with head injury.  He reports after he struck his head he was "knocked out for awhile".  He states he woke up somewhat confused.  He is complaining of pain in the back of his head and neck.  Patient also injured right ankle yesterday and reports provider did not give him any pain meds to help.  Review of Systems  Positive: As above Negative: As above  Physical Exam  BP 117/86 (BP Location: Right Arm)   Pulse 80   Temp 98.2 F (36.8 C) (Oral)   Resp 20   Ht 5\' 9"  (1.753 m)   Wt 113.4 kg   SpO2 99%   BMI 36.92 kg/m  Gen:   Awake, no distress, alert and oriented   Resp:  Normal effort  MSK:   Moves extremities without difficulty  Other:  Sleeping when entered room but easily roused.  He is moving all extremities appropriately.  Speech is clear.    Medical Decision Making  Medically screening exam initiated at 12:25 AM.  Appropriate orders placed.  Luana Shu was informed that the remainder of the evaluation will be completed by another provider, this initial triage assessment does not replace that evaluation, and the importance of remaining in the ED until their evaluation is complete.     Melton Alar R, [place] 09/19/23 [phone]

## 2023-09-19 NOTE — Discharge Instructions (Signed)
You were seen today for concern for head injury after a fall.  Your CT imaging does not show any injuries.  You may take Tylenol or ibuprofen for your pain.

## 2023-09-19 NOTE — ED Provider Notes (Signed)
[place] EMERGENCY DEPARTMENT AT [facility] Provider Note   CSN: [id number] Arrival date & time: 09/18/23  2211     History  Chief Complaint  Patient presents with   Lister Capelle is a 47 y.o. male.  HPI     This is a 47 year old male who presents following a fall.  Patient was seen and evaluated yesterday for an ankle injury.  He was diagnosed with a sprain.  Patient reports he slipped and fell backwards hitting his head.  He reports loss of consciousness.  He is not on any blood thinners.  He is reporting mostly head and neck pain.  He has not taken anything for his pain.  Denies nausea or vomiting.  Home Medications Prior to Admission medications   Medication Sig Start Date End Date Taking? Authorizing Provider  TRUEplus Lancets 28G MISC Check your blood sugar before eating each morning and before each meal. 06/27/23   Linwood Dibbles, MD  atorvastatin (LIPITOR) 80 MG tablet Take 1 tablet (80 mg total) by mouth daily. Patient not taking: Reported on 06/27/2023 10/15/22   Atway, Rayann N, DO  azithromycin (ZITHROMAX) 250 MG tablet Take 1 tablet (250 mg total) by mouth daily. Take first 2 tablets together, then 1 every day until finished. 09/18/23   Roxy Horseman, PA-C  benzonatate (TESSALON) 100 MG capsule Take 1 capsule (100 mg total) by mouth 2 (two) times daily as needed for cough. 09/18/23   Roxy Horseman, PA-C  blood glucose meter kit and supplies KIT Check your blood sugar before eating each morning and before each meal. 06/27/23   Linwood Dibbles, MD  dicyclomine (BENTYL) 20 MG tablet Take 1 tablet (20 mg total) by mouth 2 (two) times daily. Patient not taking: Reported on 06/27/2023 02/28/23   Mannie Stabile, PA-C  empagliflozin (JARDIANCE) 10 MG TABS tablet Take 1 tablet (10 mg total) by mouth daily. Patient not taking: Reported on 06/27/2023 10/15/22   Atway, Rayann N, DO  famotidine (PEPCID) 20 MG tablet Take 1 tablet (20 mg total) by mouth  daily. Patient not taking: Reported on 06/27/2023 05/28/22   Steffanie Rainwater, MD  fenofibrate 54 MG tablet Take 1 tablet (54 mg total) by mouth daily. Patient not taking: Reported on 06/27/2023 10/15/22   Atway, Rayann N, DO  gabapentin (NEURONTIN) 300 MG capsule Take 1 capsule (300 mg total) by mouth 3 (three) times daily. 06/27/23   Linwood Dibbles, MD  glucose blood test strip Check your blood sugar before eating each morning and before each meal. 06/27/23   Linwood Dibbles, MD  HYDROmorphone (DILAUDID) 2 MG tablet Take 1 tablet (2 mg total) by mouth every 6 (six) hours as needed for severe pain. Patient not taking: Reported on 06/27/2023 08/03/22   Steffanie Rainwater, MD  hydrOXYzine (ATARAX) 25 MG tablet Take 1 tablet (25 mg total) by mouth 3 (three) times daily as needed. Patient not taking: Reported on 06/27/2023 06/05/22   Doran Stabler, DO  ibuprofen (ADVIL) 600 MG tablet Take 1 tablet (600 mg total) by mouth every 6 (six) hours as needed. 09/12/23   Roxy Horseman, PA-C  insulin aspart (NOVOLOG) 100 UNIT/ML FlexPen Inject 10 Units into the skin 3 (three) times daily with meals. Patient not taking: Reported on 06/27/2023 06/05/22   Doran Stabler, DO  insulin glargine (LANTUS) 100 UNIT/ML Solostar Pen Inject 25 Units into the skin daily. Patient not taking: Reported on 06/27/2023 06/05/22   Doran Stabler, DO  Insulin Pen Needle (PENTIPS) 31G X 8 MM MISC Check your blood sugar before eating each morning and before each meal. Patient not taking: Reported on 06/27/2023 06/21/22   Doran Stabler, DO  Lancet Devices (TRUEDRAW LANCING DEVICE) MISC Use as directed 06/27/23   Linwood Dibbles, MD  loperamide (IMODIUM) 2 MG capsule Take 1 capsule (2 mg total) by mouth 4 (four) times daily as needed for diarrhea or loose stools. Patient not taking: Reported on 06/27/2023 02/28/23   Renne Crigler, PA-C  losartan (COZAAR) 25 MG tablet Take 1 tablet (25 mg total) by mouth daily. Patient not taking: Reported on 06/27/2023 01/26/21 08/01/22  Wouk, Wilfred Curtis, MD  melatonin 3 MG TABS tablet Take 1 tablet (3 mg total) by mouth at bedtime as needed. Patient not taking: Reported on 06/27/2023 05/27/22   Steffanie Rainwater, MD  metFORMIN (GLUCOPHAGE-XR) 500 MG 24 hr tablet Take 1 tablet (500 mg total) by mouth 2 (two) times daily. Patient not taking: Reported on 06/27/2023 10/25/22   Doran Stabler, DO  nicotine (NICODERM CQ - DOSED IN MG/24 HOURS) 14 mg/24hr patch Place 1 patch (14 mg total) onto the skin daily. Patient not taking: Reported on 06/27/2023 08/04/22   Steffanie Rainwater, MD  ondansetron (ZOFRAN-ODT) 4 MG disintegrating tablet Take 1 tablet (4 mg total) by mouth every 8 (eight) hours as needed for nausea or vomiting. Patient not taking: Reported on 06/27/2023 02/28/23   Renne Crigler, PA-C  ondansetron (ZOFRAN-ODT) 4 MG disintegrating tablet Take 1 tablet (4 mg total) by mouth every 8 (eight) hours as needed for nausea or vomiting. Patient not taking: Reported on 06/27/2023 02/28/23   Mannie Stabile, PA-C      Allergies    Oxycodone    Review of Systems   Review of Systems  Constitutional:  Negative for fever.  Gastrointestinal:  Negative for nausea and vomiting.  Musculoskeletal:  Positive for neck pain.  Neurological:  Positive for headaches. Negative for dizziness, weakness and numbness.  All other systems reviewed and are negative.   Physical Exam Updated Vital Signs BP 117/86 (BP Location: Right Arm)   Pulse 80   Temp 98.2 F (36.8 C) (Oral)   Resp 20   Ht 1.753 m (5\' 9" )   Wt 113.4 kg   SpO2 99%   BMI 36.92 kg/m  Physical Exam Vitals and nursing note reviewed.  Constitutional:      Appearance: He is well-developed. He is not ill-appearing.  HENT:     Head: Normocephalic and atraumatic.     Comments: No obvious hematomas or head trauma Eyes:     Pupils: Pupils are equal, round, and reactive to light.  Cardiovascular:     Rate and Rhythm: Normal rate and regular rhythm.     Heart sounds: Normal heart sounds. No  murmur heard. Pulmonary:     Effort: Pulmonary effort is normal. No respiratory distress.     Breath sounds: Normal breath sounds.  Abdominal:     Palpations: Abdomen is soft.     Tenderness: There is no abdominal tenderness. There is no rebound.  Musculoskeletal:     Cervical back: Normal range of motion and neck supple.     Comments: Brace right ankle  Lymphadenopathy:     Cervical: No cervical adenopathy.  Skin:    General: Skin is warm and dry.  Neurological:     Mental Status: He is alert and oriented to person, place, and time.     Comments: Cranial nerves  II through XII intact, 5 out of 5 strength in all 4 extremities  Psychiatric:        Mood and Affect: Mood normal.     ED Results / Procedures / Treatments   Labs (all labs ordered are listed, but only abnormal results are displayed) Labs Reviewed  BASIC METABOLIC PANEL - Abnormal; Notable for the following components:      Result Value   Glucose, Bld 108 (*)    Calcium 8.6 (*)    All other components within normal limits  CBG MONITORING, ED - Abnormal; Notable for the following components:   Glucose-Capillary 100 (*)    All other components within normal limits  CBC WITH DIFFERENTIAL/PLATELET  CBG MONITORING, ED    EKG EKG Interpretation Date/Time:  Wednesday September 18 2023 23:33:06 EDT Ventricular Rate:  70 PR Interval:  156 QRS Duration:  86 QT Interval:  394 QTC Calculation: 425 R Axis:   84  Text Interpretation: Normal sinus rhythm Abnormal QRS-T angle, consider primary T wave abnormality Abnormal ECG When compared with ECG of 12-Sep-2023 05:04, PREVIOUS ECG IS PRESENT Confirmed by Ross Marcus ([id number]) on 09/19/2023 1:44:07 AM  Radiology CT Head Wo Contrast  Result Date: 09/19/2023 CLINICAL DATA:  Recent fall with headaches and loss of consciousness, initial encounter EXAM: CT HEAD WITHOUT CONTRAST CT CERVICAL SPINE WITHOUT CONTRAST TECHNIQUE: Multidetector CT imaging of the head and cervical  spine was performed following the standard protocol without intravenous contrast. Multiplanar CT image reconstructions of the cervical spine were also generated. RADIATION DOSE REDUCTION: This exam was performed according to the departmental dose-optimization program which includes automated exposure control, adjustment of the mA and/or kV according to patient size and/or use of iterative reconstruction technique. COMPARISON:  07/23/2023 FINDINGS: CT HEAD FINDINGS Brain: No evidence of acute infarction, hemorrhage, hydrocephalus, extra-axial collection or mass lesion/mass effect. Vascular: No hyperdense vessel or unexpected calcification. Skull: Normal. Negative for fracture or focal lesion. Sinuses/Orbits: No acute finding. Other: None. CT CERVICAL SPINE FINDINGS Alignment: Straightening of the normal cervical lordosis is noted. Skull base and vertebrae: 7 cervical segments are well visualized. Multilevel osteophytic change is noted. No acute fracture or acute facet abnormality is noted. The odontoid is within normal limits. Soft tissues and spinal canal: Surrounding soft tissue structures are within normal limits. Upper chest: Visualized lung apices are within normal limits. Other: None IMPRESSION: CT of the head: No acute intracranial abnormality noted. CT of cervical spine: Mild degenerative change without acute abnormality. Electronically Signed   By: Alcide Clever M.D.   On: 09/19/2023 01:30   CT Cervical Spine Wo Contrast  Result Date: 09/19/2023 CLINICAL DATA:  Recent fall with headaches and loss of consciousness, initial encounter EXAM: CT HEAD WITHOUT CONTRAST CT CERVICAL SPINE WITHOUT CONTRAST TECHNIQUE: Multidetector CT imaging of the head and cervical spine was performed following the standard protocol without intravenous contrast. Multiplanar CT image reconstructions of the cervical spine were also generated. RADIATION DOSE REDUCTION: This exam was performed according to the departmental  dose-optimization program which includes automated exposure control, adjustment of the mA and/or kV according to patient size and/or use of iterative reconstruction technique. COMPARISON:  07/23/2023 FINDINGS: CT HEAD FINDINGS Brain: No evidence of acute infarction, hemorrhage, hydrocephalus, extra-axial collection or mass lesion/mass effect. Vascular: No hyperdense vessel or unexpected calcification. Skull: Normal. Negative for fracture or focal lesion. Sinuses/Orbits: No acute finding. Other: None. CT CERVICAL SPINE FINDINGS Alignment: Straightening of the normal cervical lordosis is noted. Skull base and vertebrae: 7 cervical  segments are well visualized. Multilevel osteophytic change is noted. No acute fracture or acute facet abnormality is noted. The odontoid is within normal limits. Soft tissues and spinal canal: Surrounding soft tissue structures are within normal limits. Upper chest: Visualized lung apices are within normal limits. Other: None IMPRESSION: CT of the head: No acute intracranial abnormality noted. CT of cervical spine: Mild degenerative change without acute abnormality. Electronically Signed   By: Alcide Clever M.D.   On: 09/19/2023 01:30   DG Ankle Complete Right  Result Date: 09/18/2023 CLINICAL DATA:  Twisting injury following stepping off curb, initial encounter EXAM: RIGHT ANKLE - COMPLETE 3+ VIEW COMPARISON:  None Available. FINDINGS: There is no evidence of fracture, dislocation, or joint effusion. There is no evidence of arthropathy or other focal bone abnormality. Soft tissues are unremarkable. IMPRESSION: No acute abnormality noted. Electronically Signed   By: Alcide Clever M.D.   On: 09/18/2023 02:01   DG Chest 2 View  Result Date: 09/18/2023 CLINICAL DATA:  Productive cough EXAM: CHEST - 2 VIEW COMPARISON:  01/23/2021 FINDINGS: Cardiac shadow is within normal limits. Patchy left basilar infiltrate is seen. No so shaded effusion is noted. No bony abnormality is noted.  IMPRESSION: Patchy left basilar infiltrate. Electronically Signed   By: Alcide Clever M.D.   On: 09/18/2023 02:00    Procedures Procedures    Medications Ordered in ED Medications  acetaminophen (TYLENOL) tablet 650 mg (has no administration in time range)    ED Course/ Medical Decision Making/ A&P                                 Medical Decision Making Risk OTC drugs.   This patient presents to the ED for concern of fall, this involves an extensive number of treatment options, and is a complaint that carries with it a high risk of complications and morbidity.  I considered the following differential and admission for this acute, potentially life threatening condition.  The differential diagnosis includes head injury, neck injury, fracture  MDM:    This is a 47 year old male who presents with fall from standing.  Reports headache and neck pain.  He is greater than 8 hours from fall.  No nausea or vomiting.  No red flags.  Vital signs are reassuring.  No obvious external signs of trauma.  CT head and cervical spine obtained and showed no evidence of acute traumatic injury.  Low suspicion for ligamentous injury.  Labs are reassuring as sent from triage.  Patient was given Tylenol.  Recommend ongoing supportive measures at home with ibuprofen or Tylenol for pain.  (Labs, imaging, consults)  Labs: I Ordered, and personally interpreted labs.  The pertinent results include: CBC, BMP  Imaging Studies ordered: I ordered imaging studies including CT head, cervical spine I independently visualized and interpreted imaging. I agree with the radiologist interpretation  Additional history obtained from chart review.  External records from outside source obtained and reviewed including prior evaluations  Cardiac Monitoring: The patient was not maintained on a cardiac monitor.  If on the cardiac monitor, I personally viewed and interpreted the cardiac monitored which showed an underlying rhythm  of: N/A  Reevaluation: After the interventions noted above, I reevaluated the patient and found that they have :stayed the same  Social Determinants of Health:  lives independently  Disposition: Discharge  Co morbidities that complicate the patient evaluation  Past Medical History:  Diagnosis Date  Asthma    Diabetes mellitus without complication (HCC)    GI bleed    Hypertension      Medicines Meds ordered this encounter  Medications   acetaminophen (TYLENOL) tablet 650 mg    I have reviewed the patients home medicines and have made adjustments as needed  Problem List / ED Course: Problem List Items Addressed This Visit   None Visit Diagnoses     Minor head injury, initial encounter    -  Primary                   Final Clinical Impression(s) / ED Diagnoses Final diagnoses:  Minor head injury, initial encounter    Rx / DC Orders ED Discharge Orders     None         Shon Baton, MD 09/19/23 [phone]

## 2024-06-01 ENCOUNTER — Emergency Department (HOSPITAL_COMMUNITY)

## 2024-06-01 ENCOUNTER — Other Ambulatory Visit: Payer: Self-pay

## 2024-06-01 ENCOUNTER — Encounter (HOSPITAL_COMMUNITY): Payer: Self-pay

## 2024-06-01 ENCOUNTER — Emergency Department (HOSPITAL_COMMUNITY)
Admission: EM | Admit: 2024-06-01 | Discharge: 2024-06-01 | Disposition: A | Discharge status: Home / Self Care | Attending: Emergency Medicine | Admitting: Emergency Medicine

## 2024-06-01 DIAGNOSIS — R0789 Other chest pain: Secondary | ICD-10-CM | POA: Insufficient documentation

## 2024-06-01 DIAGNOSIS — I10 Essential (primary) hypertension: Secondary | ICD-10-CM | POA: Insufficient documentation

## 2024-06-01 DIAGNOSIS — S00412A Abrasion of left ear, initial encounter: Secondary | ICD-10-CM | POA: Diagnosis not present

## 2024-06-01 DIAGNOSIS — E119 Type 2 diabetes mellitus without complications: Secondary | ICD-10-CM | POA: Insufficient documentation

## 2024-06-01 DIAGNOSIS — Z794 Long term (current) use of insulin: Secondary | ICD-10-CM | POA: Diagnosis not present

## 2024-06-01 DIAGNOSIS — X58XXXA Exposure to other specified factors, initial encounter: Secondary | ICD-10-CM | POA: Insufficient documentation

## 2024-06-01 DIAGNOSIS — H9202 Otalgia, left ear: Secondary | ICD-10-CM | POA: Diagnosis present

## 2024-06-01 DIAGNOSIS — Z7984 Long term (current) use of oral hypoglycemic drugs: Secondary | ICD-10-CM | POA: Insufficient documentation

## 2024-06-01 DIAGNOSIS — Z79899 Other long term (current) drug therapy: Secondary | ICD-10-CM | POA: Insufficient documentation

## 2024-06-01 LAB — CBC
HCT: 48.5 % (ref 39.0–52.0)
Hemoglobin: 15.3 g/dL (ref 13.0–17.0)
MCH: 27.1 pg (ref 26.0–34.0)
MCHC: 31.5 g/dL (ref 30.0–36.0)
MCV: 86 fL (ref 80.0–100.0)
Platelets: 259 K/uL (ref 150–400)
RBC: 5.64 MIL/uL (ref 4.22–5.81)
RDW: 13.6 % (ref 11.5–15.5)
WBC: 9.6 K/uL (ref 4.0–10.5)
nRBC: 0 % (ref 0.0–0.2)

## 2024-06-01 LAB — BASIC METABOLIC PANEL WITH GFR
Anion gap: 10 (ref 5–15)
BUN: 8 mg/dL (ref 6–20)
CO2: 23 mmol/L (ref 22–32)
Calcium: 8.9 mg/dL (ref 8.9–10.3)
Chloride: 107 mmol/L (ref 98–111)
Creatinine, Ser: 1.13 mg/dL (ref 0.61–1.24)
GFR, Estimated: >60 mL/min (ref >60)
Glucose, Bld: 102 mg/dL — ABNORMAL HIGH (ref 70–99)
Potassium: 3.8 mmol/L (ref 3.5–5.1)
Sodium: 140 mmol/L (ref 135–145)

## 2024-06-01 LAB — TROPONIN I (HIGH SENSITIVITY)
Troponin I (High Sensitivity): 8 ng/L (ref <18)
Troponin I (High Sensitivity): 9 ng/L (ref <18)

## 2024-06-01 MED ORDER — AMOXICILLIN 500 MG PO CAPS
500.0000 mg | ORAL_CAPSULE | Freq: Once | ORAL | Status: AC
  Administered 2024-06-01: 500 mg via ORAL
  Filled 2024-06-01: qty 1

## 2024-06-01 MED ORDER — AMOXICILLIN 500 MG PO CAPS: 500.0000 mg | ORAL_CAPSULE | Freq: Three times a day (TID) | ORAL | 0 refills | Status: DC

## 2024-06-01 MED ORDER — NEOMYCIN-POLYMYXIN-HC 3.5-10000-1 OT SUSP: 4.0000 [drp] | Freq: Three times a day (TID) | OTIC | 0 refills | Status: DC

## 2024-06-01 NOTE — ED Provider Notes (Signed)
 [place] [facility] AT [facility] Provider Note   CSN: [id number] Arrival date & time: 06/01/24  8251     Patient presents with: Ear Fullness and Chest Pain   Logan Lucas is a 48 y.o. male.    Ear Fullness Associated symptoms include chest pain.  Chest Pain    Patient has a history of hypertension diabetes hypertriglyceridemia, diverticulitis.  Patient presents ED with complaints of ear pain.  Patient states he has been having some sharp pain in his left ear for several days.  He does not recall any injuries.  No fevers or chills.  Patient states he is also have been having some intermittent chest discomfort.  He is not having any pressure in his chest currently.  He is not have any shortness of breath.  He does not have any fevers or chills.  No specific triggers for the chest discomfort.  Prior to Admission medications   Medication Sig Start Date End Date Taking? Authorizing Provider  amoxicillin  (AMOXIL ) 500 MG capsule Take 1 capsule (500 mg total) by mouth 3 (three) times daily. 06/01/24  Yes Randol Simmonds, MD  neomycin -polymyxin-hydrocortisone (CORTISPORIN) 3.5-10000-1 OTIC suspension Place 4 drops into the left ear 3 (three) times daily. 06/01/24  Yes Randol Simmonds, MD  TRUEplus Lancets 28G MISC Check your blood sugar before eating each morning and before each meal. 06/27/23   Randol Simmonds, MD  atorvastatin  (LIPITOR ) 80 MG tablet Take 1 tablet (80 mg total) by mouth daily. Patient not taking: Reported on 06/27/2023 10/15/22   Atway, Rayann N, DO  azithromycin  (ZITHROMAX ) 250 MG tablet Take 1 tablet (250 mg total) by mouth daily. Take first 2 tablets together, then 1 every day until finished. 09/18/23   Vicky Charleston, PA-C  benzonatate  (TESSALON ) 100 MG capsule Take 1 capsule (100 mg total) by mouth 2 (two) times daily as needed for cough. 09/18/23   Vicky Charleston, PA-C  blood glucose meter kit and supplies KIT Check your blood sugar before eating each morning  and before each meal. 06/27/23   Randol Simmonds, MD  dicyclomine  (BENTYL ) 20 MG tablet Take 1 tablet (20 mg total) by mouth 2 (two) times daily. Patient not taking: Reported on 06/27/2023 02/28/23   Aberman, Caroline C, PA-C  empagliflozin  (JARDIANCE ) 10 MG TABS tablet Take 1 tablet (10 mg total) by mouth daily. Patient not taking: Reported on 06/27/2023 10/15/22   Atway, Rayann N, DO  famotidine  (PEPCID ) 20 MG tablet Take 1 tablet (20 mg total) by mouth daily. Patient not taking: Reported on 06/27/2023 05/28/22   Lou Claretta HERO, MD  fenofibrate  54 MG tablet Take 1 tablet (54 mg total) by mouth daily. Patient not taking: Reported on 06/27/2023 10/15/22   Atway, Rayann N, DO  gabapentin  (NEURONTIN ) 300 MG capsule Take 1 capsule (300 mg total) by mouth 3 (three) times daily. 06/27/23   Randol Simmonds, MD  glucose blood test strip Check your blood sugar before eating each morning and before each meal. 06/27/23   Randol Simmonds, MD  HYDROmorphone  (DILAUDID ) 2 MG tablet Take 1 tablet (2 mg total) by mouth every 6 (six) hours as needed for severe pain. Patient not taking: Reported on 06/27/2023 08/03/22   Lou Claretta HERO, MD  hydrOXYzine  (ATARAX ) 25 MG tablet Take 1 tablet (25 mg total) by mouth 3 (three) times daily as needed. Patient not taking: Reported on 06/27/2023 06/05/22   Nguyen, Quan, DO  ibuprofen  (ADVIL ) 600 MG tablet Take 1 tablet (600 mg total) by  mouth every 6 (six) hours as needed. 09/12/23   Vicky Charleston, PA-C  insulin  aspart (NOVOLOG ) 100 UNIT/ML FlexPen Inject 10 Units into the skin 3 (three) times daily with meals. Patient not taking: Reported on 06/27/2023 06/05/22   Nguyen, Quan, DO  insulin  glargine (LANTUS ) 100 UNIT/ML Solostar Pen Inject 25 Units into the skin daily. Patient not taking: Reported on 06/27/2023 06/05/22   Nguyen, Quan, DO  Insulin  Pen Needle (PENTIPS) 31G X 8 MM MISC Check your blood sugar before eating each morning and before each meal. Patient not taking: Reported on 06/27/2023 06/21/22    Nguyen, Quan, DO  Lancet Devices (TRUEDRAW LANCING DEVICE) MISC Use as directed 06/27/23   Randol Simmonds, MD  loperamide  (IMODIUM ) 2 MG capsule Take 1 capsule (2 mg total) by mouth 4 (four) times daily as needed for diarrhea or loose stools. Patient not taking: Reported on 06/27/2023 02/28/23   Desiderio Chew, PA-C  losartan  (COZAAR ) 25 MG tablet Take 1 tablet (25 mg total) by mouth daily. Patient not taking: Reported on 06/27/2023 01/26/21 08/01/22  Wouk, Devaughn Sayres, MD  melatonin 3 MG TABS tablet Take 1 tablet (3 mg total) by mouth at bedtime as needed. Patient not taking: Reported on 06/27/2023 05/27/22   Lou Claretta HERO, MD  metFORMIN  (GLUCOPHAGE -XR) 500 MG 24 hr tablet Take 1 tablet (500 mg total) by mouth 2 (two) times daily. Patient not taking: Reported on 06/27/2023 10/25/22   Nguyen, Quan, DO  nicotine  (NICODERM CQ  - DOSED IN MG/24 HOURS) 14 mg/24hr patch Place 1 patch (14 mg total) onto the skin daily. Patient not taking: Reported on 06/27/2023 08/04/22   Lou Claretta HERO, MD  ondansetron  (ZOFRAN -ODT) 4 MG disintegrating tablet Take 1 tablet (4 mg total) by mouth every 8 (eight) hours as needed for nausea or vomiting. Patient not taking: Reported on 06/27/2023 02/28/23   Desiderio Chew, PA-C  ondansetron  (ZOFRAN -ODT) 4 MG disintegrating tablet Take 1 tablet (4 mg total) by mouth every 8 (eight) hours as needed for nausea or vomiting. Patient not taking: Reported on 06/27/2023 02/28/23   Lorelle Aleck BROCKS, PA-C    Allergies: Oxycodone     Review of Systems  Cardiovascular:  Positive for chest pain.    Updated Vital Signs BP 118/80 (BP Location: Left Arm)   Pulse 78   Temp 97.9 F (36.6 C)   Resp 17   Wt 113.4 kg   SpO2 99%   BMI 36.92 kg/m   Physical Exam Vitals and nursing note reviewed.  Constitutional:      General: He is not in acute distress.    Appearance: He is well-developed.  HENT:     Head: Normocephalic and atraumatic.     Right Ear: Tympanic membrane and external ear normal.      Left Ear: External ear normal. A middle ear effusion is present.     Ears:     Comments: Lesion noted left external auditory canal, patient also has a bulging of left TM Eyes:     General: No scleral icterus.       Right eye: No discharge.        Left eye: No discharge.     Conjunctiva/sclera: Conjunctivae normal.  Neck:     Trachea: No tracheal deviation.  Cardiovascular:     Rate and Rhythm: Normal rate and regular rhythm.  Pulmonary:     Effort: Pulmonary effort is normal. No respiratory distress.     Breath sounds: Normal breath sounds. No stridor. No wheezing or  rales.  Abdominal:     General: Bowel sounds are normal. There is no distension.     Palpations: Abdomen is soft.     Tenderness: There is no abdominal tenderness. There is no guarding or rebound.  Musculoskeletal:        General: No tenderness or deformity.     Cervical back: Neck supple.  Skin:    General: Skin is warm and dry.     Findings: No rash.  Neurological:     General: No focal deficit present.     Mental Status: He is alert.     Cranial Nerves: No cranial nerve deficit, dysarthria or facial asymmetry.     Sensory: No sensory deficit.     Motor: No abnormal muscle tone or seizure activity.     Coordination: Coordination normal.  Psychiatric:        Mood and Affect: Mood normal.     (all labs ordered are listed, but only abnormal results are displayed) Labs Reviewed  BASIC METABOLIC PANEL WITH GFR - Abnormal; Notable for the following components:      Result Value   Glucose, Bld 102 (*)    All other components within normal limits  CBC  TROPONIN I (HIGH SENSITIVITY)  TROPONIN I (HIGH SENSITIVITY)    EKG: EKG Interpretation Date/Time:  Monday June 01 2024 22:43:26 EDT Ventricular Rate:  61 PR Interval:  164 QRS Duration:  86 QT Interval:  403 QTC Calculation: 406 R Axis:   79  Text Interpretation: Sinus rhythm Abnormal T, consider ischemia, inferior leads Baseline wander in lead(s)  V5 No significant change since last tracing Confirmed by Randol Simmonds [phone]) on 06/01/2024 10:47:37 PM  Radiology: [facility] Chest 2 View Result Date: 06/01/2024 CLINICAL DATA:  Chest pain EXAM: CHEST - 2 VIEW COMPARISON:  September 18, 2023 chest radiograph FINDINGS: The heart size and mediastinal contours are within normal limits. Both lungs are clear. The visualized skeletal structures are unremarkable. IMPRESSION: No active cardiopulmonary disease. Electronically Signed   By: Megan  Zare M.D.   On: 06/01/2024 19:54     Procedures   Medications Ordered in the ED  amoxicillin  (AMOXIL ) capsule 500 mg (500 mg Oral Given 06/01/24 2245)    Clinical Course as of 06/01/24 2247  Mon Jun 01, 2024  2225 Basic metabolic panel(!) Metabolic panel normal.  CBC normal.  Troponin normal [JK]  2225 DG Chest 2 View Chest x-ray without acute findings [JK]    Clinical Course User Index [JK] Randol Simmonds, MD                                 Medical Decision Making Problems Addressed: Abrasion of left ear canal, initial encounter: acute illness or injury that poses a threat to life or bodily functions  Amount and/or Complexity of Data Reviewed Labs: ordered. Decision-making details documented in ED Course. Radiology: ordered and independent interpretation performed. Decision-making details documented in ED Course.  Risk Prescription drug management.   Patient presented with complaints of ear pain as well as intermittent chest discomfort.  Primary issue was the ear pain.  Chest discomfort is atypical.  Patient does not have any pneumonia.  Serial cardiac enzymes are normal.  EKG reassuring.  Doubt ACS.  On exam he does have an abrasion of his external auditory canal.  There also appears to be some effusion and mild erythema of the tympanic membrane.  Will start him a course  of Cortisporin eardrops and antibiotics.     Final diagnoses:  Abrasion of left ear canal, initial encounter    ED Discharge Orders           Ordered    neomycin -polymyxin-hydrocortisone (CORTISPORIN) 3.5-10000-1 OTIC suspension  3 times daily        06/01/24 2235    amoxicillin  (AMOXIL ) 500 MG capsule  3 times daily        06/01/24 2235               Randol Simmonds, MD 06/01/24 2247

## 2024-06-01 NOTE — ED Provider Triage Note (Signed)
 Emergency Medicine Provider Triage Evaluation Note  Logan Lucas , a 48 y.o. male  was evaluated in triage.  Pt complains of chest pain, left sided ear fullness. Ear pain for past 3-4 days. No drainage. Chest pain intermittently for several weeks.  Review of Systems  Positive: Chest pain, ear fullness Negative: Shob, nausea  Physical Exam  BP 118/80 (BP Location: Left Arm)   Pulse 78   Temp 97.9 F (36.6 C)   Resp 17   Wt 113.4 kg   SpO2 99%   BMI 36.92 kg/m  Gen:   Awake, no distress   Resp:  Normal effort  MSK:   Moves extremities without difficulty  Other:  Was some dried blood in the left ear canal but normal appearance of the tympanic membrane without bulging.  No erythema of canal noted.  Medical Decision Making  Medically screening exam initiated at 7:13 PM.  Appropriate orders placed.  Logan Lucas was informed that the remainder of the evaluation will be completed by another provider, this initial triage assessment does not replace that evaluation, and the importance of remaining in the ED until their evaluation is complete.  Workup initiated in triage    Logan Lucas 06/01/24 1913

## 2024-06-01 NOTE — Discharge Instructions (Signed)
 Take the antibiotics and the eardrops as prescribed.  Consider following up with a primary care doctor to be rechecked if the symptoms persist.

## 2024-06-01 NOTE — ED Triage Notes (Signed)
 Pt to er, pt c/o ear pain stats that it is a sharp pain in his ear, states that he has been having the pain for the past 3-4 days.  States that he is also here for some chest pain off and on for the past two weeks.  Stats that nothing seems to make it better or worse, states that the pain is random.

## 2024-07-16 ENCOUNTER — Other Ambulatory Visit: Payer: Self-pay

## 2024-07-16 ENCOUNTER — Emergency Department (HOSPITAL_COMMUNITY)
Admission: EM | Admit: 2024-07-16 | Discharge: 2024-07-17 | Disposition: A | Discharge status: Home / Self Care | Attending: Emergency Medicine | Admitting: Emergency Medicine

## 2024-07-16 DIAGNOSIS — F17210 Nicotine dependence, cigarettes, uncomplicated: Secondary | ICD-10-CM | POA: Diagnosis not present

## 2024-07-16 DIAGNOSIS — R202 Paresthesia of skin: Secondary | ICD-10-CM | POA: Diagnosis not present

## 2024-07-16 DIAGNOSIS — F1010 Alcohol abuse, uncomplicated: Secondary | ICD-10-CM | POA: Insufficient documentation

## 2024-07-16 DIAGNOSIS — E119 Type 2 diabetes mellitus without complications: Secondary | ICD-10-CM | POA: Insufficient documentation

## 2024-07-16 DIAGNOSIS — Y901 Blood alcohol level of 20-39 mg/100 ml: Secondary | ICD-10-CM | POA: Insufficient documentation

## 2024-07-16 DIAGNOSIS — J45909 Unspecified asthma, uncomplicated: Secondary | ICD-10-CM | POA: Diagnosis not present

## 2024-07-16 DIAGNOSIS — I10 Essential (primary) hypertension: Secondary | ICD-10-CM | POA: Diagnosis not present

## 2024-07-16 NOTE — ED Triage Notes (Signed)
 Wednesday pt had episode of slurred speech and generalized weakness. This evening when he woke up at 2030 he began having left arm tingling along with the weakness that had not resolved. LSW was around 1200 noon when he got up to use bathroom and went back to sleep

## 2024-07-17 ENCOUNTER — Emergency Department (HOSPITAL_COMMUNITY)

## 2024-07-17 ENCOUNTER — Encounter (HOSPITAL_COMMUNITY): Payer: Self-pay

## 2024-07-17 LAB — I-STAT CHEM 8, ED
BUN: 12 mg/dL (ref 6–20)
Calcium, Ion: 1.08 mmol/L — ABNORMAL LOW (ref 1.15–1.40)
Chloride: 107 mmol/L (ref 98–111)
Creatinine, Ser: 1.1 mg/dL (ref 0.61–1.24)
Glucose, Bld: 108 mg/dL — ABNORMAL HIGH (ref 70–99)
HCT: 45 % (ref 39.0–52.0)
Hemoglobin: 15.3 g/dL (ref 13.0–17.0)
Potassium: 3.9 mmol/L (ref 3.5–5.1)
Sodium: 141 mmol/L (ref 135–145)
TCO2: 23 mmol/L (ref 22–32)

## 2024-07-17 LAB — CBC
HCT: 44.6 % (ref 39.0–52.0)
Hemoglobin: 14.1 g/dL (ref 13.0–17.0)
MCH: 26.9 pg (ref 26.0–34.0)
MCHC: 31.6 g/dL (ref 30.0–36.0)
MCV: 85.1 fL (ref 80.0–100.0)
Platelets: 265 K/uL (ref 150–400)
RBC: 5.24 MIL/uL (ref 4.22–5.81)
RDW: 13.6 % (ref 11.5–15.5)
WBC: 6.7 K/uL (ref 4.0–10.5)
nRBC: 0 % (ref 0.0–0.2)

## 2024-07-17 LAB — COMPREHENSIVE METABOLIC PANEL WITH GFR
ALT: 19 U/L (ref 0–44)
AST: 18 U/L (ref 15–41)
Albumin: 3.5 g/dL (ref 3.5–5.0)
Alkaline Phosphatase: 82 U/L (ref 38–126)
Anion gap: 12 (ref 5–15)
BUN: 12 mg/dL (ref 6–20)
CO2: 21 mmol/L — ABNORMAL LOW (ref 22–32)
Calcium: 8.7 mg/dL — ABNORMAL LOW (ref 8.9–10.3)
Chloride: 110 mmol/L (ref 98–111)
Creatinine, Ser: 0.96 mg/dL (ref 0.61–1.24)
GFR, Estimated: >60 mL/min (ref >60)
Glucose, Bld: 110 mg/dL — ABNORMAL HIGH (ref 70–99)
Potassium: 4.2 mmol/L (ref 3.5–5.1)
Sodium: 143 mmol/L (ref 135–145)
Total Bilirubin: 0.4 mg/dL (ref 0.0–1.2)
Total Protein: 6.5 g/dL (ref 6.5–8.1)

## 2024-07-17 LAB — APTT: aPTT: 31 s (ref 24–36)

## 2024-07-17 LAB — DIFFERENTIAL
Abs Immature Granulocytes: 0.03 K/uL (ref 0.00–0.07)
Basophils Absolute: 0.1 K/uL (ref 0.0–0.1)
Basophils Relative: 1 %
Eosinophils Absolute: 0.3 K/uL (ref 0.0–0.5)
Eosinophils Relative: 4 %
Immature Granulocytes: 0 %
Lymphocytes Relative: 37 %
Lymphs Abs: 2.5 K/uL (ref 0.7–4.0)
Monocytes Absolute: 0.5 K/uL (ref 0.1–1.0)
Monocytes Relative: 8 %
Neutro Abs: 3.3 K/uL (ref 1.7–7.7)
Neutrophils Relative %: 50 %

## 2024-07-17 LAB — RAPID URINE DRUG SCREEN, HOSP PERFORMED
Amphetamines: POSITIVE — AB
Barbiturates: NOT DETECTED
Benzodiazepines: NOT DETECTED
Cocaine: POSITIVE — AB
Opiates: NOT DETECTED
Tetrahydrocannabinol: POSITIVE — AB

## 2024-07-17 LAB — PROTIME-INR
INR: 0.9 (ref 0.8–1.2)
Prothrombin Time: 12.9 s (ref 11.4–15.2)

## 2024-07-17 LAB — ETHANOL: Alcohol, Ethyl (B): 25 mg/dL — ABNORMAL HIGH (ref <15)

## 2024-07-17 NOTE — ED Provider Notes (Signed)
 Emergency Department Provider Note  TRIAGE NOTE: Wednesday pt had episode of slurred speech and generalized weakness. This evening when he woke up at 2030 he began having left arm tingling along with the weakness that had not resolved. LSW was around 1200 noon when he got up to use bathroom and went back to sleep  HISTORY  Chief Complaint Numbness   HPI Logan Lucas is a 48 y.o. male with   a chief complaint of blacking out, which occurred on Wednesday. The patient reports being outside and experiencing a sudden brightening of surroundings, followed by an inability to recall events clearly. The patient's sister noted slurred speech and incoherence during the episode, raising concerns about a possible stroke. The patient also reports a history of significant alcohol consumption the night before the incident. On the day of presentation, the patient experienced tingling in the left arm and chest tightness, though the tingling did not radiate beyond the arm. The patient has a past medical history of diabetes, which has not required insulin  for almost a year, and a previously diagnosed heart arrhythmia. There is a family history of stroke and heart problems. The patient denies any recent blood pressure issues and has not experienced similar episodes in the past. The patient currently feels off, tired, and jittery but denies persistent arm tingling. History was obtained from the patient.  PMH Past Medical History:  Diagnosis Date   Asthma    Diabetes mellitus without complication (HCC)    GI bleed    Hypertension     Home Medications Prior to Admission medications   Medication Sig Start Date End Date Taking? Authorizing Provider  TRUEplus Lancets 28G MISC Check your blood sugar before eating each morning and before each meal. 06/27/23   Randol Simmonds, MD  amoxicillin  (AMOXIL ) 500 MG capsule Take 1 capsule (500 mg total) by mouth 3 (three) times daily. 06/01/24   Randol Simmonds, MD  azithromycin   (ZITHROMAX ) 250 MG tablet Take 1 tablet (250 mg total) by mouth daily. Take first 2 tablets together, then 1 every day until finished. 09/18/23   Vicky Charleston, PA-C  benzonatate  (TESSALON ) 100 MG capsule Take 1 capsule (100 mg total) by mouth 2 (two) times daily as needed for cough. 09/18/23   Vicky Charleston, PA-C  blood glucose meter kit and supplies KIT Check your blood sugar before eating each morning and before each meal. 06/27/23   Randol Simmonds, MD  gabapentin  (NEURONTIN ) 300 MG capsule Take 1 capsule (300 mg total) by mouth 3 (three) times daily. 06/27/23   Randol Simmonds, MD  glucose blood test strip Check your blood sugar before eating each morning and before each meal. 06/27/23   Randol Simmonds, MD  ibuprofen  (ADVIL ) 600 MG tablet Take 1 tablet (600 mg total) by mouth every 6 (six) hours as needed. 09/12/23   Vicky Charleston, PA-C  Lancet Devices (TRUEDRAW LANCING DEVICE) MISC Use as directed 06/27/23   Randol Simmonds, MD  neomycin -polymyxin-hydrocortisone (CORTISPORIN) 3.5-10000-1 OTIC suspension Place 4 drops into the left ear 3 (three) times daily. 06/01/24   Randol Simmonds, MD    Social History Social History   Tobacco Use   Smoking status: Every Day    Current packs/day: 1.00    Average packs/day: 1 pack/day for 5.0 years (5.0 ttl pk-yrs)    Types: Cigarettes   Smokeless tobacco: Never  Vaping Use   Vaping status: Never Used  Substance Use Topics   Alcohol use: Not Currently    Alcohol/week: 7.0 standard  drinks of alcohol    Types: 2 Cans of beer, 5 Shots of liquor per week    Comment: weekends   Drug use: No    Frequency: 1.0 times per week    Review of Systems: Documented in HPI ____________________________________________  PHYSICAL EXAM: VITAL SIGNS: Triage: Blood pressure (!) 142/103, pulse 89, temperature 98.3 F (36.8 C), resp. rate 16, height 5' 8 (1.727 m), weight 108.9 kg, SpO2 98%.  Vitals:   07/16/24 2358 07/16/24 2358 07/17/24 0224  BP:  (!) 142/103 (!) 132/91  Pulse:   89 89  Resp:  16 18  Temp:  98.3 F (36.8 C) 98.1 F (36.7 C)  SpO2:  98% 99%  Weight: 108.9 kg    Height: 5' 8 (1.727 m)      Physical Exam Vitals and nursing note reviewed.  Constitutional:      Appearance: He is well-developed.  HENT:     Head: Normocephalic and atraumatic.  Cardiovascular:     Rate and Rhythm: Normal rate.  Pulmonary:     Effort: Pulmonary effort is normal. No respiratory distress.  Abdominal:     General: There is no distension.  Musculoskeletal:        General: Normal range of motion.     Cervical back: Normal range of motion.  Neurological:     General: No focal deficit present.     Mental Status: He is alert.     Cranial Nerves: No cranial nerve deficit.     Motor: No weakness.     Coordination: Coordination normal.  Psychiatric:        Mood and Affect: Mood normal.       ____________________________________________   LABS (all labs ordered are listed, but only abnormal results are displayed)  Labs Reviewed  ETHANOL - Abnormal; Notable for the following components:      Result Value   Alcohol, Ethyl (B) 25 (*)    All other components within normal limits  COMPREHENSIVE METABOLIC PANEL WITH GFR - Abnormal; Notable for the following components:   CO2 21 (*)    Glucose, Bld 110 (*)    Calcium  8.7 (*)    All other components within normal limits  RAPID URINE DRUG SCREEN, HOSP PERFORMED - Abnormal; Notable for the following components:   Cocaine POSITIVE (*)    Amphetamines POSITIVE (*)    Tetrahydrocannabinol POSITIVE (*)    All other components within normal limits  I-STAT CHEM 8, ED - Abnormal; Notable for the following components:   Glucose, Bld 108 (*)    Calcium , Ion 1.08 (*)    All other components within normal limits  PROTIME-INR  APTT  CBC  DIFFERENTIAL   ____________________________________________  EKG   EKG Interpretation Date/Time:  Friday July 17 2024 01:22:19 EDT Ventricular Rate:  72 PR  Interval:  160 QRS Duration:  88 QT Interval:  394 QTC Calculation: 431 R Axis:   60  Text Interpretation: Normal sinus rhythm T wave abnormality, consider inferior ischemia Abnormal ECG When compared with ECG of 01-Jun-2024 22:43, PREVIOUS ECG IS PRESENT Confirmed by Lorette Mayo [phone]) on 07/17/2024 2:03:06 AM        ____________________________________________  RADIOLOGY  CT HEAD WO CONTRAST ( ) Result Date: 07/17/2024 CLINICAL DATA:  Syncope 2 days ago. Weakness, difficulty with speech since. EXAM: CT HEAD WITHOUT CONTRAST TECHNIQUE: Contiguous axial images were obtained from the base of the skull through the vertex without intravenous contrast. RADIATION DOSE REDUCTION: This exam was performed according to the  departmental dose-optimization program which includes automated exposure control, adjustment of the mA and/or kV according to patient size and/or use of iterative reconstruction technique. COMPARISON:  09/19/2023 FINDINGS: Brain: No acute intracranial abnormality. Specifically, no hemorrhage, hydrocephalus, mass lesion, acute infarction, or significant intracranial injury. 2 Vascular: No hyperdense vessel or unexpected calcification. Skull: No acute calvarial abnormality. Sinuses/Orbits: No acute findings Other: None IMPRESSION: Normal study. Electronically Signed   By: Franky Crease M.D.   On: 07/17/2024 00:46   ____________________________________________  PROCEDURES  Procedure(s) performed:   Procedures ____________________________________________  INITIAL IMPRESSION / ASSESSMENT AND PLAN   Patient experienced blackout on Wednesday with slurred speech and today reports tingling in the left arm and chest tightness. Plan:  Review test results including blood test, CT, and urine screen.   ED Course  Images ordered viewed and obtained by myself. Agree with Radiology interpretation. Details in ED course.  Labs ordered reviewed by myself as detailed in ED course.   Consultations obtained/considered detailed in ED course.   Clinical Course as of 07/17/24 9676  Fri Jul 17, 2024  0203 Alcohol, Ethyl (B)(!): 25 [JM]  0203 COCAINE(!): POSITIVE [JM]  0203 Amphetamines(!): POSITIVE [JM]  0203 Tetrahydrocannabinol(!): POSITIVE Patient only confirms THC use. Denies recent alcohol (but did drink quite a bit on Tuesday  night, may have been slipped something?). This could account for his tiredness and slurred speech on Wednesday and tiredness today as well.   [JM]  0204 CT HEAD WO CONTRAST ( ) Reassuring on my interpretation, no large bleed or mass.  [JM]  0204 ED EKG No e/o ischemia or STEMI [JM]    Clinical Course User Index [JM] Trimaine Maser, Selinda, MD      Cardiac Monitoring:  The patient was maintained on a cardiac monitor.  I personally viewed and interpreted the cardiac monitored which showed an underlying rhythm of: sinus  CRITICAL INTERVENTIONS:  N/a  FINAL IMPRESSION Final diagnoses:  Paresthesia  Alcohol abuse    A medical screening exam was performed and I feel the patient has had an appropriate workup for their chief complaint at this time and likelihood of emergent condition existing is low. They have been counseled on decision, DISCHARGE, follow up and which symptoms necessitate immediate return to the emergency department. They or their family verbally stated understanding and agreement with plan and discharged in stable condition.   ____________________________________________   NEW OUTPATIENT MEDICATIONS STARTED DURING THIS VISIT:  Discharge Medication List as of 07/17/2024  2:24 AM      Note:  This note was prepared with assistance of Dragon voice recognition software. Occasional wrong-word or sound-a-like substitutions may have occurred due to the inherent limitations of voice recognition software.    Analie Katzman, Selinda, MD 07/17/24 [phone]

## 2024-07-17 NOTE — ED Notes (Signed)
 PT D/C'D AFTER INSTRUCTIONS REVIEWED. PT VERBALIZED UNDERSTANDING. NAD REPORTED OR NOTED AT THIS TIME.

## 2024-08-13 ENCOUNTER — Emergency Department (HOSPITAL_COMMUNITY)

## 2024-08-13 ENCOUNTER — Emergency Department (HOSPITAL_COMMUNITY)
Admission: EM | Admit: 2024-08-13 | Discharge: 2024-08-14 | Disposition: A | Discharge status: Home / Self Care | Attending: Emergency Medicine | Admitting: Emergency Medicine

## 2024-08-13 ENCOUNTER — Other Ambulatory Visit: Payer: Self-pay

## 2024-08-13 DIAGNOSIS — I10 Essential (primary) hypertension: Secondary | ICD-10-CM | POA: Diagnosis not present

## 2024-08-13 DIAGNOSIS — J45909 Unspecified asthma, uncomplicated: Secondary | ICD-10-CM | POA: Diagnosis not present

## 2024-08-13 DIAGNOSIS — K5732 Diverticulitis of large intestine without perforation or abscess without bleeding: Secondary | ICD-10-CM | POA: Insufficient documentation

## 2024-08-13 DIAGNOSIS — N179 Acute kidney failure, unspecified: Secondary | ICD-10-CM | POA: Insufficient documentation

## 2024-08-13 DIAGNOSIS — R1030 Lower abdominal pain, unspecified: Secondary | ICD-10-CM | POA: Diagnosis present

## 2024-08-13 DIAGNOSIS — E119 Type 2 diabetes mellitus without complications: Secondary | ICD-10-CM | POA: Insufficient documentation

## 2024-08-13 LAB — COMPREHENSIVE METABOLIC PANEL WITH GFR
ALT: 14 U/L (ref 0–44)
AST: 14 U/L — ABNORMAL LOW (ref 15–41)
Albumin: 3.5 g/dL (ref 3.5–5.0)
Alkaline Phosphatase: 88 U/L (ref 38–126)
Anion gap: 10 (ref 5–15)
BUN: 9 mg/dL (ref 6–20)
CO2: 25 mmol/L (ref 22–32)
Calcium: 8.6 mg/dL — ABNORMAL LOW (ref 8.9–10.3)
Chloride: 103 mmol/L (ref 98–111)
Creatinine, Ser: 1.29 mg/dL — ABNORMAL HIGH (ref 0.61–1.24)
GFR, Estimated: >60 mL/min (ref >60)
Glucose, Bld: 108 mg/dL — ABNORMAL HIGH (ref 70–99)
Potassium: 3.7 mmol/L (ref 3.5–5.1)
Sodium: 138 mmol/L (ref 135–145)
Total Bilirubin: 0.8 mg/dL (ref 0.0–1.2)
Total Protein: 6.3 g/dL — ABNORMAL LOW (ref 6.5–8.1)

## 2024-08-13 LAB — URINALYSIS, ROUTINE W REFLEX MICROSCOPIC
Bilirubin Urine: NEGATIVE
Glucose, UA: NEGATIVE mg/dL
Hgb urine dipstick: NEGATIVE
Ketones, ur: NEGATIVE mg/dL
Leukocytes,Ua: NEGATIVE
Nitrite: NEGATIVE
Protein, ur: NEGATIVE mg/dL
Specific Gravity, Urine: 1.01 (ref 1.005–1.030)
pH: 7 (ref 5.0–8.0)

## 2024-08-13 LAB — CBC WITH DIFFERENTIAL/PLATELET
Abs Immature Granulocytes: 0.05 K/uL (ref 0.00–0.07)
Basophils Absolute: 0.1 K/uL (ref 0.0–0.1)
Basophils Relative: 0 %
Eosinophils Absolute: 0.3 K/uL (ref 0.0–0.5)
Eosinophils Relative: 3 %
HCT: 43.8 % (ref 39.0–52.0)
Hemoglobin: 14 g/dL (ref 13.0–17.0)
Immature Granulocytes: 0 %
Lymphocytes Relative: 20 %
Lymphs Abs: 2.3 K/uL (ref 0.7–4.0)
MCH: 27.1 pg (ref 26.0–34.0)
MCHC: 32 g/dL (ref 30.0–36.0)
MCV: 84.9 fL (ref 80.0–100.0)
Monocytes Absolute: 0.9 K/uL (ref 0.1–1.0)
Monocytes Relative: 8 %
Neutro Abs: 7.6 K/uL (ref 1.7–7.7)
Neutrophils Relative %: 69 %
Platelets: 256 K/uL (ref 150–400)
RBC: 5.16 MIL/uL (ref 4.22–5.81)
RDW: 13.8 % (ref 11.5–15.5)
WBC: 11.2 K/uL — ABNORMAL HIGH (ref 4.0–10.5)
nRBC: 0 % (ref 0.0–0.2)

## 2024-08-13 LAB — I-STAT CHEM 8, ED
BUN: 8 mg/dL (ref 6–20)
Calcium, Ion: 1.13 mmol/L — ABNORMAL LOW (ref 1.15–1.40)
Chloride: 100 mmol/L (ref 98–111)
Creatinine, Ser: 1.4 mg/dL — ABNORMAL HIGH (ref 0.61–1.24)
Glucose, Bld: 112 mg/dL — ABNORMAL HIGH (ref 70–99)
HCT: 43 % (ref 39.0–52.0)
Hemoglobin: 14.6 g/dL (ref 13.0–17.0)
Potassium: 3.5 mmol/L (ref 3.5–5.1)
Sodium: 139 mmol/L (ref 135–145)
TCO2: 25 mmol/L (ref 22–32)

## 2024-08-13 MED ORDER — IOHEXOL 350 MG/ML SOLN
75.0000 mL | Freq: Once | INTRAVENOUS | Status: AC | PRN
  Administered 2024-08-13: 75 mL via INTRAVENOUS

## 2024-08-13 NOTE — ED Provider Triage Note (Signed)
 Emergency Medicine Provider Triage Evaluation Note  Logan Lucas , a 48 y.o. male  was evaluated in triage.  Pt complains of abdominal pain, nausea.  Review of Systems  Positive: Abdominal pain, nausea, cough Negative: Shortness of breath, chest pain, loss of consciousness, dizziness, headache  Physical Exam  BP (!) 146/83 (BP Location: Right Arm)   Pulse 73   Temp 99.1 F (37.3 C)   Resp 19   Ht 5' 8 (1.727 m)   Wt 108.9 kg   SpO2 99%   BMI 36.50 kg/m  Gen:   Awake, no distress   Resp:  Normal effort lungs clear to auscultation MSK:   Moves extremities without difficulty  Other:  Patient has diffuse pain to palpation throughout entire abdomen.  Bowel sounds noted  Medical Decision Making  Medically screening exam initiated at 7:57 PM.  Appropriate orders placed.  [name] was informed that the remainder of the evaluation will be completed by another provider, this initial triage assessment does not replace that evaluation, and the importance of remaining in the ED until their evaluation is complete.  48 year old male presents ED with complaints of abdominal pain since Tuesday.  Patient reports it started in the left and right lower quadrant and has since progressed to the entire abdomen.  Patient reports his last large bowel movement was Tuesday which was painful without blood.  Patient advises the last few days he has had smaller bowel movements but reports pain.  No noted rectal pain or blood in stools.  Patient has no history of abdominal surgeries.  Murphy's and McBurney's hard to test due to patient reporting diffuse abdominal pain.   Myriam Fonda RAMAN, [place] 08/13/24 2001

## 2024-08-13 NOTE — ED Triage Notes (Signed)
 Pt coming in today with lower abdominal pain pt reporting nausea. Pt reporting increased pain with urination increased pain with passing gas and increased pain with having a BM.

## 2024-08-14 LAB — LIPASE, BLOOD: Lipase: 48 U/L (ref 11–51)

## 2024-08-14 MED ORDER — DICYCLOMINE HCL 10 MG PO CAPS
10.0000 mg | ORAL_CAPSULE | Freq: Once | ORAL | Status: AC
  Administered 2024-08-14: 10 mg via ORAL
  Filled 2024-08-14: qty 1

## 2024-08-14 MED ORDER — KETOROLAC TROMETHAMINE 15 MG/ML IJ SOLN
15.0000 mg | Freq: Once | INTRAMUSCULAR | Status: AC
  Administered 2024-08-14: 15 mg via INTRAVENOUS
  Filled 2024-08-14: qty 1

## 2024-08-14 MED ORDER — ONDANSETRON 4 MG PO TBDP: 4.0000 mg | ORAL_TABLET | Freq: Three times a day (TID) | ORAL | 0 refills | Status: DC | PRN

## 2024-08-14 MED ORDER — CIPROFLOXACIN HCL 500 MG PO TABS
500.0000 mg | ORAL_TABLET | Freq: Once | ORAL | Status: AC
  Administered 2024-08-14: 500 mg via ORAL
  Filled 2024-08-14: qty 1

## 2024-08-14 MED ORDER — METRONIDAZOLE 500 MG PO TABS
500.0000 mg | ORAL_TABLET | Freq: Three times a day (TID) | ORAL | 0 refills | Status: DC
Start: 2024-08-14 — End: 2024-08-31

## 2024-08-14 MED ORDER — ONDANSETRON HCL 4 MG/2ML IJ SOLN
4.0000 mg | Freq: Once | INTRAMUSCULAR | Status: AC
  Administered 2024-08-14: 4 mg via INTRAVENOUS
  Filled 2024-08-14: qty 2

## 2024-08-14 MED ORDER — METRONIDAZOLE 500 MG PO TABS
500.0000 mg | ORAL_TABLET | Freq: Once | ORAL | Status: AC
  Administered 2024-08-14: 500 mg via ORAL
  Filled 2024-08-14: qty 1

## 2024-08-14 MED ORDER — CIPROFLOXACIN HCL 500 MG PO TABS
500.0000 mg | ORAL_TABLET | Freq: Two times a day (BID) | ORAL | 0 refills | Status: DC
Start: 2024-08-14 — End: 2024-08-31

## 2024-08-14 NOTE — ED Provider Notes (Signed)
 [place] EMERGENCY DEPARTMENT AT [facility] Provider Note   CSN: [id number] Arrival date & time: 08/13/24  1617     Patient presents with: Abdominal Pain   Logan Lucas is a 48 y.o. male.   48 year old male with a history of asthma, hypertension, diabetes presents to the emergency department for evaluation of abdominal pain.  Reports onset of pain on Tuesday after having a bowel movement.  Pain present in the lower abdomen and has been persistent.  He has had some nausea without vomiting.  Denies melena, hematochezia, fever, prior abdominal surgeries.  No hx of colonoscopy.   The history is provided by the patient. No language interpreter was used.  Abdominal Pain      Prior to Admission medications   Medication Sig Start Date End Date Taking? Authorizing Provider  ciprofloxacin  (CIPRO ) 500 MG tablet Take 1 tablet (500 mg total) by mouth every 12 (twelve) hours. 08/14/24  Yes Keith Sor, PA-C  metroNIDAZOLE  (FLAGYL ) 500 MG tablet Take 1 tablet (500 mg total) by mouth 3 (three) times daily. 08/14/24  Yes Keith Sor, PA-C  ondansetron  (ZOFRAN -ODT) 4 MG disintegrating tablet Take 1 tablet (4 mg total) by mouth every 8 (eight) hours as needed for nausea or vomiting. 08/14/24  Yes Keith Sor, PA-C  TRUEplus Lancets 28G MISC Check your blood sugar before eating each morning and before each meal. 06/27/23   Randol Simmonds, MD  blood glucose meter kit and supplies KIT Check your blood sugar before eating each morning and before each meal. 06/27/23   Randol Simmonds, MD  gabapentin  (NEURONTIN ) 300 MG capsule Take 1 capsule (300 mg total) by mouth 3 (three) times daily. 06/27/23   Randol Simmonds, MD  glucose blood test strip Check your blood sugar before eating each morning and before each meal. 06/27/23   Randol Simmonds, MD  ibuprofen  (ADVIL ) 600 MG tablet Take 1 tablet (600 mg total) by mouth every 6 (six) hours as needed. 09/12/23   Vicky Charleston, PA-C  Lancet Devices (TRUEDRAW LANCING  DEVICE) MISC Use as directed 06/27/23   Randol Simmonds, MD  neomycin -polymyxin-hydrocortisone (CORTISPORIN) 3.5-10000-1 OTIC suspension Place 4 drops into the left ear 3 (three) times daily. 06/01/24   Randol Simmonds, MD    Allergies: Oxycodone     Review of Systems  Gastrointestinal:  Positive for abdominal pain.  Ten systems reviewed and are negative for acute change, except as noted in the HPI.    Updated Vital Signs BP 118/75 (BP Location: Right Arm)   Pulse 70   Temp 98.2 F (36.8 C)   Resp 18   Ht 5' 8 (1.727 m)   Wt 108.9 kg   SpO2 99%   BMI 36.50 kg/m   Physical Exam Vitals and nursing note reviewed.  Constitutional:      General: He is not in acute distress.    Appearance: He is well-developed. He is not diaphoretic.     Comments: Nontoxic appearing and in NAD  HENT:     Head: Normocephalic and atraumatic.  Eyes:     General: No scleral icterus.    Conjunctiva/sclera: Conjunctivae normal.  Cardiovascular:     Rate and Rhythm: Normal rate and regular rhythm.     Pulses: Normal pulses.  Pulmonary:     Effort: Pulmonary effort is normal. No respiratory distress.     Comments: Respirations even and unlabored Abdominal:     Palpations: Abdomen is soft. There is no mass.     Tenderness: There is abdominal  tenderness.     Comments: Lower abdominal TTP, worse in the suprapubic abdomen. No palpable masses, involuntary guarding.   Musculoskeletal:        General: Normal range of motion.     Cervical back: Normal range of motion.  Skin:    General: Skin is warm and dry.     Coloration: Skin is not pale.     Findings: No erythema or rash.  Neurological:     Mental Status: He is alert and oriented to person, place, and time.  Psychiatric:        Behavior: Behavior normal.     (all labs ordered are listed, but only abnormal results are displayed) Labs Reviewed  COMPREHENSIVE METABOLIC PANEL WITH GFR - Abnormal; Notable for the following components:      Result Value    Glucose, Bld 108 (*)    Creatinine, Ser 1.29 (*)    Calcium  8.6 (*)    Total Protein 6.3 (*)    AST 14 (*)    All other components within normal limits  CBC WITH DIFFERENTIAL/PLATELET - Abnormal; Notable for the following components:   WBC 11.2 (*)    All other components within normal limits  I-STAT CHEM 8, ED - Abnormal; Notable for the following components:   Creatinine, Ser 1.40 (*)    Glucose, Bld 112 (*)    Calcium , Ion 1.13 (*)    All other components within normal limits  LIPASE, BLOOD  URINALYSIS, ROUTINE W REFLEX MICROSCOPIC    EKG: None  Radiology: CT ABDOMEN PELVIS W CONTRAST Result Date: 08/13/2024 CLINICAL DATA:  Abdominal pain, acute, nonlocalized Pt coming in today with lower abdominal pain pt reporting nausea. Pt reporting increased pain with urination increased pain with passing gas and increased pain with having a BM. EXAM: CT ABDOMEN AND PELVIS WITH CONTRAST TECHNIQUE: Multidetector CT imaging of the abdomen and pelvis was performed using the standard protocol following bolus administration of intravenous contrast. RADIATION DOSE REDUCTION: This exam was performed according to the departmental dose-optimization program which includes automated exposure control, adjustment of the mA and/or kV according to patient size and/or use of iterative reconstruction technique. CONTRAST:  75mL OMNIPAQUE  IOHEXOL  350 MG/ML SOLN COMPARISON:  CT abdomen pelvis 02/28/2023 FINDINGS: Lower chest: No acute abnormality. Hepatobiliary: No focal liver abnormality. No gallstones, gallbladder wall thickening, or pericholecystic fluid. No biliary dilatation. Pancreas: No focal lesion. Normal pancreatic contour. No surrounding inflammatory changes. No main pancreatic ductal dilatation. Spleen: Normal in size without focal abnormality. Adrenals/Urinary Tract: Bilateral fluid density nodules along the adrenal glands are stable from prior and consistent with adrenal adenomas-no further follow-up  indicated. Bilateral kidneys enhance symmetrically. Fluid dense lesion Macario of the left kidney likely represents a simple renal cyst. Simple renal cysts, in the absence of clinically indicated signs/symptoms, require no independent follow-up. No hydronephrosis. No hydroureter.  No nephroureterolithiasis. The urinary bladder is unremarkable. Stomach/Bowel: Stomach is within normal limits. No evidence of small bowel wall thickening or dilatation. Mid sigmoid bowel wall thickening with a focal inflammatory changes and pericolonic fat stranding along a diverticula. Inflammatory changes extend inferiorly to the urinary bladder dome with retained intraperitoneal fat between the sigmoid and the urinary bladder dome. Appendix appears normal. Vascular/Lymphatic: No abdominal aorta or iliac aneurysm. Mild atherosclerotic plaque of the aorta and its branches. No abdominal, pelvic, or inguinal lymphadenopathy. Reproductive: Prostate is unremarkable. Other: No intraperitoneal free fluid. No intraperitoneal free gas. No organized fluid collection. Musculoskeletal: No abdominal wall hernia or abnormality. No suspicious  lytic or blastic osseous lesions. No acute displaced fracture. IMPRESSION: Colonic diverticulosis with mid sigmoid acute diverticulitis. Recommend colonoscopy status post treatment and status post complete resolution of inflammatory changes to exclude an underlying lesion. Electronically Signed   By: Morgane  Naveau M.D.   On: 08/13/2024 22:44     Procedures   Medications Ordered in the ED  iohexol  (OMNIPAQUE ) 350 MG/ML injection 75 mL (75 mLs Intravenous Contrast Given 08/13/24 2233)  ketorolac  (TORADOL ) 15 MG/ML injection 15 mg (15 mg Intravenous Given 08/14/24 0049)  ondansetron  (ZOFRAN ) injection 4 mg (4 mg Intravenous Given 08/14/24 0049)  ciprofloxacin  (CIPRO ) tablet 500 mg (500 mg Oral Given 08/14/24 0049)  metroNIDAZOLE  (FLAGYL ) tablet 500 mg (500 mg Oral Given 08/14/24 0049)  dicyclomine  (BENTYL )  capsule 10 mg (10 mg Oral Given 08/14/24 0250)    Clinical Course as of 08/14/24 0509  Fri Aug 14, 2024  0213 Patient reported to nursing some improvement in his pain following these medications.  Stable for discharge with course of ciprofloxacin  and Flagyl . [KH]    Clinical Course User Index [KH] Keith Sor, PA-C                                 Medical Decision Making Risk Prescription drug management.   This patient presents to the ED for concern of abdominal pain, this involves an extensive number of treatment options, and is a complaint that carries with it a high risk of complications and morbidity.  The differential diagnosis includes diverticulitis vs intraabdominal abscess vs pSBO/SBO vs constipation vs UTI vs kidney stone vs ruptured viscous.   Co morbidities that complicate the patient evaluation  Asthma  HTN DM   Additional history obtained:  External records from outside source obtained and reviewed including CT abd/pelvis in April 2024 which showed sigmoid diverticulosis.   Lab Tests:  I Ordered, and personally interpreted labs.  The pertinent results include:  WBC 11.2, creatinine 1.29   Imaging Studies ordered:  I ordered imaging studies including CT abd/pelvis w/contrast  I independently visualized and interpreted imaging which showed sigmoid diverticulitis. I agree with the radiologist interpretation   Cardiac Monitoring:  The patient was maintained on a cardiac monitor.  I personally viewed and interpreted the cardiac monitored which showed an underlying rhythm of: NSR   Medicines ordered and prescription drug management:  I ordered medication including Bentyl  and Toradol  for pain, Zofran  for nausea Reevaluation of the patient after these medicines showed that the patient improved I have reviewed the patients home medicines and have made adjustments as needed   Problem List / ED Course:  As above Patient presenting for abdominal pain with  primary tenderness in his suprapubic abdomen.  This does correlate with findings of sigmoid diverticulitis on CT.  No complicating features such as perforation or abscess.  The patient does not meet SIRS criteria; no concern for sepsis. Started on antibiotics prior to discharge.  Feeling a bit better after IV Toradol .  Referral initiated to GI as patient due for routine colonoscopy; no PCP.   Reevaluation:  After the interventions noted above, I reevaluated the patient and found that they have :improved   Social Determinants of Health:  Lives independently   Dispostion:  After consideration of the diagnostic results and the patients response to treatment, I feel that the patent would benefit from outpatient course of abx as well as GI follow up. Return precautions discussed and provided. Patient  discharged in stable condition with no unaddressed concerns.       Final diagnoses:  Sigmoid diverticulitis  AKI (acute kidney injury) [facility])    ED Discharge Orders          Ordered    Ambulatory referral to Gastroenterology        08/14/24 0045    ciprofloxacin  (CIPRO ) 500 MG tablet  Every 12 hours        08/14/24 0046    metroNIDAZOLE  (FLAGYL ) 500 MG tablet  3 times daily        08/14/24 0046    ondansetron  (ZOFRAN -ODT) 4 MG disintegrating tablet  Every 8 hours PRN        08/14/24 0046               Keith Sor, PA-C 08/14/24 9482    Jerral Meth, MD 08/14/24 1926

## 2024-08-14 NOTE — Discharge Instructions (Addendum)
 Your CT showed findings consistent with diverticulitis.  Take ciprofloxacin  and Flagyl  as prescribed until finished.  Your kidney function was slightly elevated which is likely due to dehydration.  Continue a clear liquid diet and drink plenty of fluids to prevent dehydration.  Follow-up with a primary care doctor to ensure resolution of symptoms.  We also advise follow-up with a gastroenterologist for routine colonoscopy once symptoms/pain resolve.  Return to the ED for new or concerning symptoms.

## 2024-08-25 ENCOUNTER — Emergency Department (HOSPITAL_COMMUNITY)

## 2024-08-25 ENCOUNTER — Inpatient Hospital Stay (HOSPITAL_COMMUNITY)
Admission: EM | Admit: 2024-08-25 | Discharge: 2024-08-31 | DRG: 379 | Disposition: A | Discharge status: Home / Self Care | Attending: Internal Medicine | Admitting: Internal Medicine

## 2024-08-25 ENCOUNTER — Other Ambulatory Visit: Payer: Self-pay

## 2024-08-25 ENCOUNTER — Encounter (HOSPITAL_COMMUNITY): Payer: Self-pay

## 2024-08-25 DIAGNOSIS — E876 Hypokalemia: Secondary | ICD-10-CM | POA: Diagnosis present

## 2024-08-25 DIAGNOSIS — Z833 Family history of diabetes mellitus: Secondary | ICD-10-CM

## 2024-08-25 DIAGNOSIS — F17210 Nicotine dependence, cigarettes, uncomplicated: Secondary | ICD-10-CM | POA: Diagnosis present

## 2024-08-25 DIAGNOSIS — K625 Hemorrhage of anus and rectum: Secondary | ICD-10-CM

## 2024-08-25 DIAGNOSIS — Z79899 Other long term (current) drug therapy: Secondary | ICD-10-CM | POA: Diagnosis not present

## 2024-08-25 DIAGNOSIS — I10 Essential (primary) hypertension: Secondary | ICD-10-CM | POA: Diagnosis present

## 2024-08-25 DIAGNOSIS — E119 Type 2 diabetes mellitus without complications: Secondary | ICD-10-CM | POA: Diagnosis present

## 2024-08-25 DIAGNOSIS — N3090 Cystitis, unspecified without hematuria: Secondary | ICD-10-CM

## 2024-08-25 DIAGNOSIS — Z885 Allergy status to narcotic agent status: Secondary | ICD-10-CM

## 2024-08-25 DIAGNOSIS — K5741 Diverticulitis of both small and large intestine with perforation and abscess with bleeding: Principal | ICD-10-CM | POA: Diagnosis present

## 2024-08-25 DIAGNOSIS — K5792 Diverticulitis of intestine, part unspecified, without perforation or abscess without bleeding: Secondary | ICD-10-CM | POA: Diagnosis not present

## 2024-08-25 LAB — COMPREHENSIVE METABOLIC PANEL WITH GFR
ALT: 10 U/L (ref 0–44)
AST: 12 U/L — ABNORMAL LOW (ref 15–41)
Albumin: 3.8 g/dL (ref 3.5–5.0)
Alkaline Phosphatase: 143 U/L — ABNORMAL HIGH (ref 38–126)
Anion gap: 13 (ref 5–15)
BUN: 9 mg/dL (ref 6–20)
CO2: 22 mmol/L (ref 22–32)
Calcium: 9.1 mg/dL (ref 8.9–10.3)
Chloride: 104 mmol/L (ref 98–111)
Creatinine, Ser: 1.11 mg/dL (ref 0.61–1.24)
GFR, Estimated: >60 mL/min (ref >60)
Glucose, Bld: 115 mg/dL — ABNORMAL HIGH (ref 70–99)
Potassium: 3.2 mmol/L — ABNORMAL LOW (ref 3.5–5.1)
Sodium: 139 mmol/L (ref 135–145)
Total Bilirubin: 0.7 mg/dL (ref 0.0–1.2)
Total Protein: 7.1 g/dL (ref 6.5–8.1)

## 2024-08-25 LAB — URINALYSIS, ROUTINE W REFLEX MICROSCOPIC
Bilirubin Urine: NEGATIVE
Glucose, UA: NEGATIVE mg/dL
Hgb urine dipstick: NEGATIVE
Ketones, ur: 20 mg/dL — AB
Leukocytes,Ua: NEGATIVE
Nitrite: NEGATIVE
Protein, ur: 30 mg/dL — AB
Specific Gravity, Urine: 1.028 (ref 1.005–1.030)
pH: 5 (ref 5.0–8.0)

## 2024-08-25 LAB — GLUCOSE, CAPILLARY
Glucose-Capillary: 147 mg/dL — ABNORMAL HIGH (ref 70–99)
Glucose-Capillary: 83 mg/dL (ref 70–99)
Glucose-Capillary: 84 mg/dL (ref 70–99)
Glucose-Capillary: 88 mg/dL (ref 70–99)

## 2024-08-25 LAB — CBC
HCT: 43.7 % (ref 39.0–52.0)
Hemoglobin: 13.3 g/dL (ref 13.0–17.0)
MCH: 25.8 pg — ABNORMAL LOW (ref 26.0–34.0)
MCHC: 30.4 g/dL (ref 30.0–36.0)
MCV: 84.9 fL (ref 80.0–100.0)
Platelets: 314 K/uL (ref 150–400)
RBC: 5.15 MIL/uL (ref 4.22–5.81)
RDW: 13.3 % (ref 11.5–15.5)
WBC: 15.2 K/uL — ABNORMAL HIGH (ref 4.0–10.5)
nRBC: 0 % (ref 0.0–0.2)

## 2024-08-25 LAB — CBG MONITORING, ED: Glucose-Capillary: 103 mg/dL — ABNORMAL HIGH (ref 70–99)

## 2024-08-25 MED ORDER — POTASSIUM CHLORIDE CRYS ER 20 MEQ PO TBCR
40.0000 meq | EXTENDED_RELEASE_TABLET | Freq: Once | ORAL | Status: AC
Start: 2024-08-25 — End: 2024-08-25
  Administered 2024-08-25: 40 meq via ORAL
  Filled 2024-08-25: qty 2

## 2024-08-25 MED ORDER — ACETAMINOPHEN 650 MG RE SUPP: 650.0000 mg | Freq: Four times a day (QID) | RECTAL | Status: DC | PRN

## 2024-08-25 MED ORDER — ACETAMINOPHEN 325 MG PO TABS: 650.0000 mg | ORAL_TABLET | Freq: Four times a day (QID) | ORAL | Status: DC | PRN

## 2024-08-25 MED ORDER — PIPERACILLIN-TAZOBACTAM 3.375 G IVPB 30 MIN
3.3750 g | Freq: Once | INTRAVENOUS | Status: AC
  Administered 2024-08-25: 3.375 g via INTRAVENOUS
  Filled 2024-08-25: qty 50

## 2024-08-25 MED ORDER — PIPERACILLIN-TAZOBACTAM 3.375 G IVPB
3.3750 g | Freq: Three times a day (TID) | INTRAVENOUS | Status: DC
Start: 2024-08-25 — End: 2024-08-31
  Administered 2024-08-25 – 2024-08-31 (×18): 3.375 g via INTRAVENOUS
  Filled 2024-08-25 (×18): qty 50

## 2024-08-25 MED ORDER — INSULIN ASPART 100 UNIT/ML IJ SOLN
0.0000 [IU] | INTRAMUSCULAR | Status: DC
  Administered 2024-08-25 – 2024-08-27 (×2): 2 [IU] via SUBCUTANEOUS
  Administered 2024-08-27 (×2): 3 [IU] via SUBCUTANEOUS
  Administered 2024-08-28: 2 [IU] via SUBCUTANEOUS
  Administered 2024-08-28 (×2): 3 [IU] via SUBCUTANEOUS
  Administered 2024-08-29 (×3): 2 [IU] via SUBCUTANEOUS
  Administered 2024-08-29: 3 [IU] via SUBCUTANEOUS
  Administered 2024-08-30: 2 [IU] via SUBCUTANEOUS
  Administered 2024-08-30: 3 [IU] via SUBCUTANEOUS
  Administered 2024-08-30: 2 [IU] via SUBCUTANEOUS
  Administered 2024-08-31: 3 [IU] via SUBCUTANEOUS
  Filled 2024-08-25: qty 0.15

## 2024-08-25 MED ORDER — TRAZODONE HCL 50 MG PO TABS
50.0000 mg | ORAL_TABLET | Freq: Every evening | ORAL | Status: DC | PRN
  Administered 2024-08-25 – 2024-08-30 (×6): 50 mg via ORAL
  Filled 2024-08-25 (×6): qty 1

## 2024-08-25 MED ORDER — ONDANSETRON HCL 4 MG PO TABS: 4.0000 mg | ORAL_TABLET | Freq: Four times a day (QID) | ORAL | Status: DC | PRN

## 2024-08-25 MED ORDER — POTASSIUM CHLORIDE 10 MEQ/100ML IV SOLN: 10.0000 meq | INTRAVENOUS | Status: DC

## 2024-08-25 MED ORDER — VANCOMYCIN HCL 2000 MG/400ML IV SOLN
2000.0000 mg | Freq: Once | INTRAVENOUS | Status: AC
  Administered 2024-08-25: 2000 mg via INTRAVENOUS
  Filled 2024-08-25: qty 400

## 2024-08-25 MED ORDER — VANCOMYCIN HCL IN DEXTROSE 1-5 GM/200ML-% IV SOLN: 1000.0000 mg | Freq: Once | INTRAVENOUS | Status: DC

## 2024-08-25 MED ORDER — IOHEXOL 300 MG/ML  SOLN
100.0000 mL | Freq: Once | INTRAMUSCULAR | Status: AC | PRN
  Administered 2024-08-25: 100 mL via INTRAVENOUS

## 2024-08-25 MED ORDER — ALBUTEROL SULFATE (2.5 MG/3ML) 0.083% IN NEBU: 2.5000 mg | INHALATION_SOLUTION | RESPIRATORY_TRACT | Status: DC | PRN

## 2024-08-25 MED ORDER — FENTANYL CITRATE PF 50 MCG/ML IJ SOSY
50.0000 ug | PREFILLED_SYRINGE | Freq: Once | INTRAMUSCULAR | Status: AC
  Administered 2024-08-25: 50 ug via INTRAVENOUS
  Filled 2024-08-25: qty 1

## 2024-08-25 MED ORDER — ONDANSETRON HCL 4 MG/2ML IJ SOLN: 4.0000 mg | Freq: Four times a day (QID) | INTRAMUSCULAR | Status: DC | PRN

## 2024-08-25 MED ORDER — HYDROMORPHONE HCL 1 MG/ML IJ SOLN
0.5000 mg | INTRAMUSCULAR | Status: AC | PRN
  Administered 2024-08-25 – 2024-08-30 (×23): 1 mg via INTRAVENOUS
  Filled 2024-08-25 (×22): qty 1

## 2024-08-25 NOTE — H&P (Signed)
 History and Physical  Logan Lucas FMW:[id number] DOB: [birth year] DOA: 08/25/2024  PCP: Patient, No Pcp Per   Chief Complaint: Abdominal pain  HPI: Logan Lucas is a 48 y.o. male with medical history significant for asthma, hypertension, diet-controlled diabetes diagnosed with acute diverticulitis on 9/19 being admitted to the hospital with worsening abdominal pain and diverticulitis with abscess.  States that he has been compliant with oral antibiotics prescribed on 9/19, however he has had continued worsening left-sided abdominal pain.  He also has dysuria.  He has had subjective fevers, severe nausea without vomiting.  Denies diarrhea.  Review of Systems: Please see HPI for pertinent positives and negatives. A complete 10 system review of systems are otherwise negative.  Past Medical History:  Diagnosis Date   Asthma    Diabetes mellitus without complication (HCC)    GI bleed    Hypertension    Past Surgical History:  Procedure Laterality Date   LEFT HEART CATH AND CORONARY ANGIOGRAPHY N/A 01/25/2021   Procedure: LEFT HEART CATH AND CORONARY ANGIOGRAPHY;  Surgeon: Mady Bruckner, MD;  Location: [facility] INVASIVE CV LAB;  Service: Cardiovascular;  Laterality: N/A;   none     Social History:  reports that he has been smoking cigarettes. He has a 5 pack-year smoking history. He has never used smokeless tobacco. He reports that he does not currently use alcohol after a past usage of about 7.0 standard drinks of alcohol per week. He reports that he does not use drugs.  Allergies  Allergen Reactions   Oxycodone  Itching    Family History  Problem Relation Age of Onset   Diabetes Mother    Cancer Father      Prior to Admission medications   Medication Sig Start Date End Date Taking? Authorizing Provider  TRUEplus Lancets 28G MISC Check your blood sugar before eating each morning and before each meal. 06/27/23   Randol Simmonds, MD  blood glucose meter kit and supplies KIT Check  your blood sugar before eating each morning and before each meal. 06/27/23   Randol Simmonds, MD  ciprofloxacin  (CIPRO ) 500 MG tablet Take 1 tablet (500 mg total) by mouth every 12 (twelve) hours. 08/14/24   Keith Sor, PA-C  gabapentin  (NEURONTIN ) 300 MG capsule Take 1 capsule (300 mg total) by mouth 3 (three) times daily. 06/27/23   Randol Simmonds, MD  glucose blood test strip Check your blood sugar before eating each morning and before each meal. 06/27/23   Randol Simmonds, MD  ibuprofen  (ADVIL ) 600 MG tablet Take 1 tablet (600 mg total) by mouth every 6 (six) hours as needed. 09/12/23   Vicky Charleston, PA-C  Lancet Devices (TRUEDRAW LANCING DEVICE) MISC Use as directed 06/27/23   Randol Simmonds, MD  metroNIDAZOLE  (FLAGYL ) 500 MG tablet Take 1 tablet (500 mg total) by mouth 3 (three) times daily. 08/14/24   Keith Sor, PA-C  neomycin -polymyxin-hydrocortisone (CORTISPORIN) 3.5-10000-1 OTIC suspension Place 4 drops into the left ear 3 (three) times daily. 06/01/24   Randol Simmonds, MD  ondansetron  (ZOFRAN -ODT) 4 MG disintegrating tablet Take 1 tablet (4 mg total) by mouth every 8 (eight) hours as needed for nausea or vomiting. 08/14/24   Keith Sor, PA-C    Physical Exam: BP 113/65 (BP Location: Left Arm)   Pulse 89   Temp 98.5 F (36.9 C) (Oral)   Resp 18   SpO2 100%  General:  Alert, oriented, calm, in no acute distress, resting comfortably, looks nontoxic Eyes: EOMI, clear conjuctivae, white sclerea Neck: supple,  no masses, trachea mildline  Cardiovascular: RRR, no murmurs or rubs, no peripheral edema  Respiratory: clear to auscultation bilaterally, no wheezes, no crackles  Abdomen: soft, tender diffusely especially in the left lower quadrant, voluntary guarding, nondistended, normal bowel tones heard  Skin: dry, no rashes  Musculoskeletal: no joint effusions, normal range of motion  Psychiatric: appropriate affect, normal speech  Neurologic: extraocular muscles intact, clear speech, moving all extremities with  intact sensorium         Labs on Admission:  Basic Metabolic Panel: Recent Labs  Lab 08/25/24 0323  NA 139  K 3.2*  CL 104  CO2 22  GLUCOSE 115*  BUN 9  CREATININE 1.11  CALCIUM  9.1   Liver Function Tests: Recent Labs  Lab 08/25/24 0323  AST 12*  ALT 10  ALKPHOS 143*  BILITOT 0.7  PROT 7.1  ALBUMIN 3.8   No results for input(s): LIPASE, AMYLASE in the last 168 hours. No results for input(s): AMMONIA in the last 168 hours. CBC: Recent Labs  Lab 08/25/24 0323  WBC 15.2*  HGB 13.3  HCT 43.7  MCV 84.9  PLT 314   Cardiac Enzymes: No results for input(s): CKTOTAL, CKMB, CKMBINDEX, TROPONINI in the last 168 hours. BNP (last 3 results) No results for input(s): BNP in the last 8760 hours.  ProBNP (last 3 results) No results for input(s): PROBNP in the last 8760 hours.  CBG: No results for input(s): GLUCAP in the last 168 hours.  Radiological Exams on Admission: CT ABDOMEN PELVIS W CONTRAST Result Date: 08/25/2024 CLINICAL DATA:  3 day history of abdominal pain. EXAM: CT ABDOMEN AND PELVIS WITH CONTRAST TECHNIQUE: Multidetector CT imaging of the abdomen and pelvis was performed using the standard protocol following bolus administration of intravenous contrast. RADIATION DOSE REDUCTION: This exam was performed according to the departmental dose-optimization program which includes automated exposure control, adjustment of the mA and/or kV according to patient size and/or use of iterative reconstruction technique. CONTRAST:  OMNIPAQUE  IOHEXOL  300 MG/ML  SOLN COMPARISON:  08/13/2024 FINDINGS: Lower chest: Unremarkable. Hepatobiliary: No suspicious focal abnormality within the liver parenchyma. There is no evidence for gallstones, gallbladder wall thickening, or pericholecystic fluid. No intrahepatic or extrahepatic biliary dilation. Pancreas: No focal mass lesion. No dilatation of the main duct. No intraparenchymal cyst. No peripancreatic edema.  Spleen: No splenomegaly. No suspicious focal mass lesion. 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 No followup imaging is recommended. Right kidney unremarkable. 2.9 cm cystic lesion in the upper pole left kidney is progressive since the 2021 exam and has evidence of an internal septation on today's study. 2nd smaller cyst is identified in the anterior interpolar left kidney. No evidence for hydroureter. Bladder is decompressed with circumferential bladder wall thickening. Stomach/Bowel: Stomach is unremarkable. No gastric wall thickening. No evidence of outlet obstruction. Duodenum is normally positioned as is the ligament of Treitz. No small bowel wall thickening. No small bowel dilatation. The terminal ileum is normal. The appendix is normal. A few diverticula are seen in the left colon. There is marked focal wall thickening in the mid sigmoid colon with substantial pericolonic edema/inflammation. Ill-defined 2.2 x 2.8 cm fluid collection adjacent to the colon (image 62/2) contains a tiny gas locule. These inflammatory changes are contiguous with the dome of the bladder. Vascular/Lymphatic: No abdominal aortic aneurysm. No abdominal aortic atherosclerotic calcification. There is no gastrohepatic or hepatoduodenal ligament  lymphadenopathy. No retroperitoneal or mesenteric lymphadenopathy. No pelvic sidewall lymphadenopathy. Reproductive: The prostate gland and seminal vesicles are unremarkable. Other: Trace free fluid noted low left paracolic gutter with edema in the sigmoid mesocolon. Musculoskeletal: No worrisome lytic or sclerotic osseous abnormality. IMPRESSION: 1. Marked focal wall thickening in the mid sigmoid colon with substantial pericolonic edema/inflammation. Ill-defined 2.2 x 2.8 cm fluid collection adjacent to the colon contains a tiny gas locule. Imaging  features are compatible with acute diverticulitis with evolving small diverticular abscess. The tiny gas locule could be due to infection or contained micro perforation. 2. Circumferential bladder wall thickening. This finding may be secondary to irritation from the adjacent inflammatory process but bladder infection is not excluded. 3. 2.9 cm cystic lesion in the upper pole left kidney is progressive since the 2021 exam and has evidence of an internal septation on today's study. 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 This MRI should be deferred to a nonemergent outpatient basis after patient has recovered from this acute process to best allow participation in positioning and reproducible breath holding. Electronically Signed   By: Camellia Candle M.D.   On: 08/25/2024 06:07   Assessment/Plan Logan Lucas is a 48 y.o. male with medical history significant for asthma, hypertension, diet-controlled diabetes, polysubstance abuse diagnosed with acute diverticulitis on 9/19 being admitted to the hospital with worsening abdominal pain and diverticulitis with abscess.  Acute diverticulitis-complicated by possible perforation and small abscess.  Patient is hemodynamically stable, not septic. -Inpatient admission -Keep n.p.o. except for meds and ice chips -Pain and nausea control as needed -Empiric IV Zosyn  -Follow-up blood cultures -General Surgery consult  Hypokalemia-Will replete orally  Hypertension-currently stable, will monitor  Type 2 diabetes-does not take any diabetic medication, states that he checks his blood sugar intermittently and is usually well-controlled -Update A1c -Sliding scale insulin  while n.p.o.  DVT prophylaxis: SCDs only    Code Status: Full Code  Consults called: General Surgery  Admission status: The appropriate patient status for this patient is INPATIENT. Inpatient status is judged to be  reasonable and necessary in order to provide the required intensity of service to ensure the patient's safety. The patient's presenting symptoms, physical exam findings, and initial radiographic and laboratory data in the context of their chronic comorbidities is felt to place them at high risk for further clinical deterioration. Furthermore, it is not anticipated that the patient will be medically stable for discharge from the hospital within 2 midnights of admission.    I certify that at the point of admission it is my clinical judgment that the patient will require inpatient hospital care spanning beyond 2 midnights from the point of admission due to high intensity of service, high risk for further deterioration and high frequency of surveillance required  Time spent: 56 minutes  Logan Lucas CHRISTELLA Gail MD [facility] Pager [phone]  If 7PM-7AM, please contact night-coverage [email or web address] Password [email or web address]  08/25/2024, 7:45 AM

## 2024-08-25 NOTE — ED Provider Notes (Signed)
 [facility] AT [facility] Provider Note   CSN: [id number] Arrival date & time: 08/25/24  0140     Patient presents with: Abdominal Pain   DELYLE [facility] is a 48 y.o. male.   The history is provided by the patient.  Abdominal Pain Pain location: lower abdominal pain. Pain radiates to:  Does not radiate Pain severity:  Severe Onset quality:  Gradual Duration: days. Progression:  Worsening Chronicity:  Recurrent Context: not trauma   Relieved by:  Nothing Worsened by:  Nothing Ineffective treatments:  None tried Associated symptoms: no anorexia, no fever and no vomiting   Risk factors: has not had multiple surgeries   Patient with h/o diverticulitis presents with rectal bleeding and bloody urine.      Past Medical History:  Diagnosis Date   Asthma    Diabetes mellitus without complication (HCC)    GI bleed    Hypertension      Prior to Admission medications   Medication Sig Start Date End Date Taking? Authorizing Provider  TRUEplus Lancets 28G MISC Check your blood sugar before eating each morning and before each meal. 06/27/23   Randol Simmonds, MD  blood glucose meter kit and supplies KIT Check your blood sugar before eating each morning and before each meal. 06/27/23   Randol Simmonds, MD  ciprofloxacin  (CIPRO ) 500 MG tablet Take 1 tablet (500 mg total) by mouth every 12 (twelve) hours. 08/14/24   Keith Sor, PA-C  gabapentin  (NEURONTIN ) 300 MG capsule Take 1 capsule (300 mg total) by mouth 3 (three) times daily. 06/27/23   Randol Simmonds, MD  glucose blood test strip Check your blood sugar before eating each morning and before each meal. 06/27/23   Randol Simmonds, MD  ibuprofen  (ADVIL ) 600 MG tablet Take 1 tablet (600 mg total) by mouth every 6 (six) hours as needed. 09/12/23   Vicky Charleston, PA-C  Lancet Devices (TRUEDRAW LANCING DEVICE) MISC Use as directed 06/27/23   Randol Simmonds, MD  metroNIDAZOLE  (FLAGYL ) 500 MG tablet Take 1 tablet (500 mg total) by  mouth 3 (three) times daily. 08/14/24   Keith Sor, PA-C  neomycin -polymyxin-hydrocortisone (CORTISPORIN) 3.5-10000-1 OTIC suspension Place 4 drops into the left ear 3 (three) times daily. 06/01/24   Randol Simmonds, MD  ondansetron  (ZOFRAN -ODT) 4 MG disintegrating tablet Take 1 tablet (4 mg total) by mouth every 8 (eight) hours as needed for nausea or vomiting. 08/14/24   Keith Sor, PA-C    Allergies: Oxycodone     Review of Systems  Constitutional:  Negative for fever.  HENT:  Negative for facial swelling.   Respiratory:  Negative for wheezing and stridor.   Gastrointestinal:  Positive for abdominal pain. Negative for anorexia and vomiting.  All other systems reviewed and are negative.   Updated Vital Signs BP 113/65 (BP Location: Left Arm)   Pulse 89   Temp 98.5 F (36.9 C) (Oral)   Resp 18   SpO2 100%   Physical Exam Vitals and nursing note reviewed.  Constitutional:      General: He is not in acute distress.    Appearance: Normal appearance. He is well-developed. He is not diaphoretic.  HENT:     Head: Normocephalic and atraumatic.     Nose: Nose normal.  Eyes:     Conjunctiva/sclera: Conjunctivae normal.     Pupils: Pupils are equal, round, and reactive to light.  Cardiovascular:     Rate and Rhythm: Normal rate and regular rhythm.     Pulses:  Normal pulses.     Heart sounds: Normal heart sounds.  Pulmonary:     Effort: Pulmonary effort is normal.     Breath sounds: Normal breath sounds. No wheezing or rales.  Abdominal:     General: Bowel sounds are normal.     Palpations: Abdomen is soft.     Tenderness: There is no abdominal tenderness. There is no guarding or rebound.  Musculoskeletal:        General: Normal range of motion.     Cervical back: Normal range of motion and neck supple.  Skin:    General: Skin is warm and dry.     Capillary Refill: Capillary refill takes less than 2 seconds.  Neurological:     General: No focal deficit present.     Mental Status:  He is alert and oriented to person, place, and time.     Deep Tendon Reflexes: Reflexes normal.     (all labs ordered are listed, but only abnormal results are displayed) Results for orders placed or performed during the hospital encounter of 08/25/24  Comprehensive metabolic panel   Collection Time: 08/25/24  3:23 AM  Result Value Ref Range   Sodium 139 135 - 145 mmol/L   Potassium 3.2 (L) 3.5 - 5.1 mmol/L   Chloride 104 98 - 111 mmol/L   CO2 22 22 - 32 mmol/L   Glucose, Bld 115 (H) 70 - 99 mg/dL   BUN 9 6 - 20 mg/dL   Creatinine, Ser 8.88 0.61 - 1.24 mg/dL   Calcium  9.1 8.9 - 10.3 mg/dL   Total Protein 7.1 6.5 - 8.1 g/dL   Albumin 3.8 3.5 - 5.0 g/dL   AST 12 (L) 15 - 41 U/L   ALT 10 0 - 44 U/L   Alkaline Phosphatase 143 (H) 38 - 126 U/L   Total Bilirubin 0.7 0.0 - 1.2 mg/dL   GFR, Estimated >39 >39 mL/min   Anion gap 13 5 - 15  CBC   Collection Time: 08/25/24  3:23 AM  Result Value Ref Range   WBC 15.2 (H) 4.0 - 10.5 K/uL   RBC 5.15 4.22 - 5.81 MIL/uL   Hemoglobin 13.3 13.0 - 17.0 g/dL   HCT 56.2 60.9 - 47.9 %   MCV 84.9 80.0 - 100.0 fL   MCH 25.8 (L) 26.0 - 34.0 pg   MCHC 30.4 30.0 - 36.0 g/dL   RDW 86.6 88.4 - 84.4 %   Platelets 314 150 - 400 K/uL   nRBC 0.0 0.0 - 0.2 %  Urinalysis, Routine w reflex microscopic -Urine, Clean Catch   Collection Time: 08/25/24  5:49 AM  Result Value Ref Range   Color, Urine AMBER (A) YELLOW   APPearance CLEAR CLEAR   Specific Gravity, Urine 1.028 1.005 - 1.030   pH 5.0 5.0 - 8.0   Glucose, UA NEGATIVE NEGATIVE mg/dL   Hgb urine dipstick NEGATIVE NEGATIVE   Bilirubin Urine NEGATIVE NEGATIVE   Ketones, ur 20 (A) NEGATIVE mg/dL   Protein, ur 30 (A) NEGATIVE mg/dL   Nitrite NEGATIVE NEGATIVE   Leukocytes,Ua NEGATIVE NEGATIVE   RBC / HPF 11-20 0 - 5 RBC/hpf   WBC, UA 0-5 0 - 5 WBC/hpf   Bacteria, UA RARE (A) NONE SEEN   Squamous Epithelial / HPF 0-5 0 - 5 /HPF   Mucus PRESENT    Hyaline Casts, UA PRESENT    CT ABDOMEN PELVIS W  CONTRAST Result Date: 08/25/2024 CLINICAL DATA:  3 day history of abdominal  pain. EXAM: CT ABDOMEN AND PELVIS WITH CONTRAST TECHNIQUE: Multidetector CT imaging of the abdomen and pelvis was performed using the standard protocol following bolus administration of intravenous contrast. RADIATION DOSE REDUCTION: This exam was performed according to the departmental dose-optimization program which includes automated exposure control, adjustment of the mA and/or kV according to patient size and/or use of iterative reconstruction technique. CONTRAST:  OMNIPAQUE  IOHEXOL  300 MG/ML  SOLN COMPARISON:  08/13/2024 FINDINGS: Lower chest: Unremarkable. Hepatobiliary: No suspicious focal abnormality within the liver parenchyma. There is no evidence for gallstones, gallbladder wall thickening, or pericholecystic fluid. No intrahepatic or extrahepatic biliary dilation. Pancreas: No focal mass lesion. No dilatation of the main duct. No intraparenchymal cyst. No peripancreatic edema. Spleen: No splenomegaly. No suspicious focal mass lesion. 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 No followup imaging is recommended. Right kidney unremarkable. 2.9 cm cystic lesion in the upper pole left kidney is progressive since the 2021 exam and has evidence of an internal septation on today's study. 2nd smaller cyst is identified in the anterior interpolar left kidney. No evidence for hydroureter. Bladder is decompressed with circumferential bladder wall thickening. Stomach/Bowel: Stomach is unremarkable. No gastric wall thickening. No evidence of outlet obstruction. Duodenum is normally positioned as is the ligament of Treitz. No small bowel wall thickening. No small bowel dilatation. The terminal ileum is normal. The appendix is normal. A few diverticula are seen in the left colon. There is  marked focal wall thickening in the mid sigmoid colon with substantial pericolonic edema/inflammation. Ill-defined 2.2 x 2.8 cm fluid collection adjacent to the colon (image 62/2) contains a tiny gas locule. These inflammatory changes are contiguous with the dome of the bladder. Vascular/Lymphatic: No abdominal aortic aneurysm. No abdominal aortic atherosclerotic calcification. There is no gastrohepatic or hepatoduodenal ligament lymphadenopathy. No retroperitoneal or mesenteric lymphadenopathy. No pelvic sidewall lymphadenopathy. Reproductive: The prostate gland and seminal vesicles are unremarkable. Other: Trace free fluid noted low left paracolic gutter with edema in the sigmoid mesocolon. Musculoskeletal: No worrisome lytic or sclerotic osseous abnormality. IMPRESSION: 1. Marked focal wall thickening in the mid sigmoid colon with substantial pericolonic edema/inflammation. Ill-defined 2.2 x 2.8 cm fluid collection adjacent to the colon contains a tiny gas locule. Imaging features are compatible with acute diverticulitis with evolving small diverticular abscess. The tiny gas locule could be due to infection or contained micro perforation. 2. Circumferential bladder wall thickening. This finding may be secondary to irritation from the adjacent inflammatory process but bladder infection is not excluded. 3. 2.9 cm cystic lesion in the upper pole left kidney is progressive since the 2021 exam and has evidence of an internal septation on today's study. 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 This MRI should be deferred to a nonemergent outpatient basis after patient has recovered from this acute process to best allow participation in positioning and reproducible breath holding. Electronically Signed   By: Camellia Candle M.D.   On: 08/25/2024 06:07   CT ABDOMEN PELVIS W CONTRAST Result Date: 08/13/2024 CLINICAL DATA:  Abdominal pain, acute,  nonlocalized Pt coming in today with lower abdominal pain pt reporting nausea. Pt reporting increased pain with urination increased pain with passing gas and increased pain with having a BM. EXAM: CT ABDOMEN AND PELVIS WITH CONTRAST TECHNIQUE: Multidetector CT imaging of the abdomen and pelvis was performed  using the standard protocol following bolus administration of intravenous contrast. RADIATION DOSE REDUCTION: This exam was performed according to the departmental dose-optimization program which includes automated exposure control, adjustment of the mA and/or kV according to patient size and/or use of iterative reconstruction technique. CONTRAST:  75mL OMNIPAQUE  IOHEXOL  350 MG/ML SOLN COMPARISON:  CT abdomen pelvis 02/28/2023 FINDINGS: Lower chest: No acute abnormality. Hepatobiliary: No focal liver abnormality. No gallstones, gallbladder wall thickening, or pericholecystic fluid. No biliary dilatation. Pancreas: No focal lesion. Normal pancreatic contour. No surrounding inflammatory changes. No main pancreatic ductal dilatation. Spleen: Normal in size without focal abnormality. Adrenals/Urinary Tract: Bilateral fluid density nodules along the adrenal glands are stable from prior and consistent with adrenal adenomas-no further follow-up indicated. Bilateral kidneys enhance symmetrically. Fluid dense lesion Macario of the left kidney likely represents a simple renal cyst. Simple renal cysts, in the absence of clinically indicated signs/symptoms, require no independent follow-up. No hydronephrosis. No hydroureter.  No nephroureterolithiasis. The urinary bladder is unremarkable. Stomach/Bowel: Stomach is within normal limits. No evidence of small bowel wall thickening or dilatation. Mid sigmoid bowel wall thickening with a focal inflammatory changes and pericolonic fat stranding along a diverticula. Inflammatory changes extend inferiorly to the urinary bladder dome with retained intraperitoneal fat between the sigmoid  and the urinary bladder dome. Appendix appears normal. Vascular/Lymphatic: No abdominal aorta or iliac aneurysm. Mild atherosclerotic plaque of the aorta and its branches. No abdominal, pelvic, or inguinal lymphadenopathy. Reproductive: Prostate is unremarkable. Other: No intraperitoneal free fluid. No intraperitoneal free gas. No organized fluid collection. Musculoskeletal: No abdominal wall hernia or abnormality. No suspicious lytic or blastic osseous lesions. No acute displaced fracture. IMPRESSION: Colonic diverticulosis with mid sigmoid acute diverticulitis. Recommend colonoscopy status post treatment and status post complete resolution of inflammatory changes to exclude an underlying lesion. Electronically Signed   By: Morgane  Naveau M.D.   On: 08/13/2024 22:44     Radiology: CT ABDOMEN PELVIS W CONTRAST Result Date: 08/25/2024 CLINICAL DATA:  3 day history of abdominal pain. EXAM: CT ABDOMEN AND PELVIS WITH CONTRAST TECHNIQUE: Multidetector CT imaging of the abdomen and pelvis was performed using the standard protocol following bolus administration of intravenous contrast. RADIATION DOSE REDUCTION: This exam was performed according to the departmental dose-optimization program which includes automated exposure control, adjustment of the mA and/or kV according to patient size and/or use of iterative reconstruction technique. CONTRAST:  OMNIPAQUE  IOHEXOL  300 MG/ML  SOLN COMPARISON:  08/13/2024 FINDINGS: Lower chest: Unremarkable. Hepatobiliary: No suspicious focal abnormality within the liver parenchyma. There is no evidence for gallstones, gallbladder wall thickening, or pericholecystic fluid. No intrahepatic or extrahepatic biliary dilation. Pancreas: No focal mass lesion. No dilatation of the main duct. No intraparenchymal cyst. No peripancreatic edema. Spleen: No splenomegaly. No suspicious focal mass lesion. Adrenals/Urinary Tract: Bilateral low-density adrenal nodules are not substantially  changed comparing back to a CT scan of 08/31/2020 suggesting benign etiology such as lipid poor adenomas. No followup imaging is recommended. Right kidney unremarkable. 2.9 cm cystic lesion in the upper pole left kidney is progressive since the 2021 exam and has evidence of an internal septation on today's study. 2nd smaller cyst is identified in the anterior interpolar left kidney. No evidence for hydroureter. Bladder is decompressed with circumferential bladder wall thickening. Stomach/Bowel: Stomach is unremarkable. No gastric wall thickening. No evidence of outlet obstruction. Duodenum is normally positioned as is the ligament of Treitz. No small bowel wall thickening. No small bowel dilatation. The terminal ileum is normal. The appendix is normal.  A few diverticula are seen in the left colon. There is marked focal wall thickening in the mid sigmoid colon with substantial pericolonic edema/inflammation. Ill-defined 2.2 x 2.8 cm fluid collection adjacent to the colon (image 62/2) contains a tiny gas locule. These inflammatory changes are contiguous with the dome of the bladder. Vascular/Lymphatic: No abdominal aortic aneurysm. No abdominal aortic atherosclerotic calcification. There is no gastrohepatic or hepatoduodenal ligament lymphadenopathy. No retroperitoneal or mesenteric lymphadenopathy. No pelvic sidewall lymphadenopathy. Reproductive: The prostate gland and seminal vesicles are unremarkable. Other: Trace free fluid noted low left paracolic gutter with edema in the sigmoid mesocolon. Musculoskeletal: No worrisome lytic or sclerotic osseous abnormality. IMPRESSION: 1. Marked focal wall thickening in the mid sigmoid colon with substantial pericolonic edema/inflammation. Ill-defined 2.2 x 2.8 cm fluid collection adjacent to the colon contains a tiny gas locule. Imaging features are compatible with acute diverticulitis with evolving small diverticular abscess. The tiny gas locule could be due to infection or  contained micro perforation. 2. Circumferential bladder wall thickening. This finding may be secondary to irritation from the adjacent inflammatory process but bladder infection is not excluded. 3. 2.9 cm cystic lesion in the upper pole left kidney is progressive since the 2021 exam and has evidence of an internal septation on today's study. 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 This MRI should be deferred to a nonemergent outpatient basis after patient has recovered from this acute process to best allow participation in positioning and reproducible breath holding. Electronically Signed   By: Camellia Candle M.D.   On: 08/25/2024 06:07     Procedures   Medications Ordered in the ED  fentaNYL  (SUBLIMAZE ) injection 50 mcg (has no administration in time range)  piperacillin -tazobactam (ZOSYN ) IVPB 3.375 g (has no administration in time range)  vancomycin (VANCOREADY) IVPB 2000 mg/400 mL (has no administration in time range)  iohexol  (OMNIPAQUE ) 300 MG/ML solution 100 mL (100 mLs Intravenous Contrast Given 08/25/24 0545)                                    Medical Decision Making Patient with rectal bleeding and dark urine   Amount and/or Complexity of Data Reviewed External Data Reviewed: notes.    Details: Previous notes reviewed  Labs: ordered.    Details: Urine without blood, positive hemoccult.  Elevated white count 15.2, normal hemoglobin 13.3, platelets. Normal sodium 139, slight low potassium 3.2, normal creatinine 1.11  Radiology: ordered. Discussion of management or test interpretation with external provider(s): 6:24 AM case d/w Dr. Lyndel of surgery, please admit to medicine surgery will consult    Risk Prescription drug management. Decision regarding hospitalization.     Final diagnoses:  Diverticulitis of both large and small intestine with perforation with bleeding  Cystitis   The patient  appears reasonably stabilized for admission considering the current resources, flow, and capabilities available in the ED at this time, and I doubt any other [facility] [place] [facility] requiring further screening and/or treatment in the ED prior to admission.  ED Discharge Orders     None          Travanti Mcmanus, MD 08/25/24 [phone]

## 2024-08-25 NOTE — ED Notes (Addendum)
 Positive POC occult

## 2024-08-25 NOTE — Progress Notes (Signed)
   08/25/24 1028  TOC Brief Assessment  Insurance and Status Reviewed  Patient has primary care physician No  Home environment has been reviewed home  Prior level of function: independent  Prior/Current Home Services No current home services  Social Drivers of Health Review SDOH reviewed no interventions necessary  Readmission risk has been reviewed Yes  Transition of care needs transition of care needs identified, TOC will continue to follow

## 2024-08-25 NOTE — Consult Note (Signed)
 Consult Note  Logan Lucas [birth year]  [id number].    Requesting MD:  Dr. Izetta Land, MD  Chief Complaint/Reason for Consult: Diverticulitis  HPI:  Logan Lucas is a 48 year old male with a PMH of diverticulitis, HTN, GI bleed, T2DM, and asthma that presented to the ED with lower abdominal pain.   Patient states that he began having left lower abdominal pain about two weeks ago and was seen in the emergency department at that time. CT imaging from 9/18 showed colonic diverticulosis with mid sigmoid acute diverticulitis. Patient was provided antibiotics and discharged with referral to GI for recommended colonoscopy.   Today, patient states that over the past two weeks, his pain has worsened and radiates across the entire lower abdomen. Patient reports associated nausea. Denies vomiting. He feels that drinking water helps his symptoms. Nothing seems to make it worse. He reports that his last bowel movement was yesterday 9/29 and mucus was present. Denies hematochezia. Reports that his last meal was yesterday 9/29. Patient also report pain with urination.   Patient reports that he has had 2 prior episodes of diverticulitis: one episode a year ago and another two years ago.    Anticoagulation medications: Denies anticoagulation medications Past Surgeries: Denies abdominal surgeries. Has had a left heart cath and coronary angiography, Dental surgery, Removal of birthmark. Colonoscopy: Has never had a colonoscopy. Tobacco use: Reports smoking cigarettes daily (1/2 pack per day). Alcohol use: Consumes alcohol on the weekends. Illicit drug use: Reports mariajuana use 2-3 times per week. Allergies: Oxycodone  (Causes itching).   ROS: Per HPI  Family History  Problem Relation Age of Onset   Diabetes Mother    Cancer Father     Past Medical History:  Diagnosis Date   Asthma    Diabetes mellitus without complication (HCC)    GI bleed    Hypertension      Past Surgical History:  Procedure Laterality Date   LEFT HEART CATH AND CORONARY ANGIOGRAPHY N/A 01/25/2021   Procedure: LEFT HEART CATH AND CORONARY ANGIOGRAPHY;  Surgeon: Mady Bruckner, MD;  Location: [facility] INVASIVE CV LAB;  Service: Cardiovascular;  Laterality: N/A;   none      Social History:  reports that he has been smoking cigarettes. He has a 5 pack-year smoking history. He has never used smokeless tobacco. He reports that he does not currently use alcohol after a past usage of about 7.0 standard drinks of alcohol per week. He reports that he does not use drugs.  Allergies:  Allergies  Allergen Reactions   Oxycodone  Itching    (Not in a hospital admission)   Blood pressure 113/65, pulse 89, temperature 98.5 F (36.9 C), temperature source Oral, resp. rate 18, SpO2 100%. Physical Exam:  General: Pleasant male who is laying in bed in NAD. HEENT: Head is normocephalic, atraumatic. Sclera are noninjected. Conjunctiva anicteric. Heart: Normal heart rate. Lungs: Respiratory effort nonlabored. Abd: Soft, with distention. Tenderness to palpation of epigastric region, LUQ, LLQ, Central pelvis, and RLQ. Some guarding present. +BS. Skin: Warm and dry. Psych: A&Ox3 with an appropriate affect.   Results for orders placed or performed during the hospital encounter of 08/25/24 (from the past 48 hours)  Comprehensive metabolic panel     Status: Abnormal   Collection Time: 08/25/24  3:23 AM  Result Value Ref Range   Sodium 139 135 - 145 mmol/L   Potassium 3.2 (L) 3.5 - 5.1 mmol/L   Chloride 104 98 - 111 mmol/L  CO2 22 22 - 32 mmol/L   Glucose, Bld 115 (H) 70 - 99 mg/dL    Comment: Glucose reference range applies only to samples taken after fasting for at least 8 hours.   BUN 9 6 - 20 mg/dL   Creatinine, Ser 8.88 0.61 - 1.24 mg/dL   Calcium  9.1 8.9 - 10.3 mg/dL   Total Protein 7.1 6.5 - 8.1 g/dL   Albumin 3.8 3.5 - 5.0 g/dL   AST 12 (L) 15 - 41 U/L   ALT 10 0 - 44 U/L    Alkaline Phosphatase 143 (H) 38 - 126 U/L   Total Bilirubin 0.7 0.0 - 1.2 mg/dL   GFR, Estimated >39 >39 mL/min    Comment: (NOTE) Calculated using the CKD-EPI Creatinine Equation (2021)    Anion gap 13 5 - 15    Comment: Performed at [facility], [address] [place], [place] [address]  CBC     Status: Abnormal   Collection Time: 08/25/24  3:23 AM  Result Value Ref Range   WBC 15.2 (H) 4.0 - 10.5 K/uL   RBC 5.15 4.22 - 5.81 MIL/uL   Hemoglobin 13.3 13.0 - 17.0 g/dL   HCT 56.2 60.9 - 47.9 %   MCV 84.9 80.0 - 100.0 fL   MCH 25.8 (L) 26.0 - 34.0 pg   MCHC 30.4 30.0 - 36.0 g/dL   RDW 86.6 88.4 - 84.4 %   Platelets 314 150 - 400 K/uL   nRBC 0.0 0.0 - 0.2 %    Comment: Performed at [facility], [address], [place], [place] [address]  Urinalysis, Routine w reflex microscopic -Urine, Clean Catch     Status: Abnormal   Collection Time: 08/25/24  5:49 AM  Result Value Ref Range   Color, Urine AMBER (A) YELLOW    Comment: BIOCHEMICALS MAY BE AFFECTED BY COLOR   APPearance CLEAR CLEAR   Specific Gravity, Urine 1.028 1.005 - 1.030   pH 5.0 5.0 - 8.0   Glucose, UA NEGATIVE NEGATIVE mg/dL   Hgb urine dipstick NEGATIVE NEGATIVE   Bilirubin Urine NEGATIVE NEGATIVE   Ketones, ur 20 (A) NEGATIVE mg/dL   Protein, ur 30 (A) NEGATIVE mg/dL   Nitrite NEGATIVE NEGATIVE   Leukocytes,Ua NEGATIVE NEGATIVE   RBC / HPF 11-20 0 - 5 RBC/hpf   WBC, UA 0-5 0 - 5 WBC/hpf   Bacteria, UA RARE (A) NONE SEEN   Squamous Epithelial / HPF 0-5 0 - 5 /HPF   Mucus PRESENT    Hyaline Casts, UA PRESENT     Comment: Performed at [facility], [address], [place], [place] [address]   CT ABDOMEN PELVIS W CONTRAST Result Date: 08/25/2024 CLINICAL DATA:  3 day history of abdominal pain. EXAM: CT ABDOMEN AND PELVIS WITH CONTRAST TECHNIQUE: Multidetector CT imaging of the abdomen and pelvis was performed using the standard protocol following bolus  administration of intravenous contrast. RADIATION DOSE REDUCTION: This exam was performed according to the departmental dose-optimization program which includes automated exposure control, adjustment of the mA and/or kV according to patient size and/or use of iterative reconstruction technique. CONTRAST:  OMNIPAQUE  IOHEXOL  300 MG/ML  SOLN COMPARISON:  08/13/2024 FINDINGS: Lower chest: Unremarkable. Hepatobiliary: No suspicious focal abnormality within the liver parenchyma. There is no evidence for gallstones, gallbladder wall thickening, or pericholecystic fluid. No intrahepatic or extrahepatic biliary dilation. Pancreas: No focal mass lesion. No dilatation of the main duct. No intraparenchymal cyst. No peripancreatic edema.  Spleen: No splenomegaly. No suspicious focal mass lesion. 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 No followup imaging is recommended. Right kidney unremarkable. 2.9 cm cystic lesion in the upper pole left kidney is progressive since the 2021 exam and has evidence of an internal septation on today's study. 2nd smaller cyst is identified in the anterior interpolar left kidney. No evidence for hydroureter. Bladder is decompressed with circumferential bladder wall thickening. Stomach/Bowel: Stomach is unremarkable. No gastric wall thickening. No evidence of outlet obstruction. Duodenum is normally positioned as is the ligament of Treitz. No small bowel wall thickening. No small bowel dilatation. The terminal ileum is normal. The appendix is normal. A few diverticula are seen in the left colon. There is marked focal wall thickening in the mid sigmoid colon with substantial pericolonic edema/inflammation. Ill-defined 2.2 x 2.8 cm fluid collection adjacent to the colon (image 62/2) contains a tiny gas locule. These inflammatory changes are  contiguous with the dome of the bladder. Vascular/Lymphatic: No abdominal aortic aneurysm. No abdominal aortic atherosclerotic calcification. There is no gastrohepatic or hepatoduodenal ligament lymphadenopathy. No retroperitoneal or mesenteric lymphadenopathy. No pelvic sidewall lymphadenopathy. Reproductive: The prostate gland and seminal vesicles are unremarkable. Other: Trace free fluid noted low left paracolic gutter with edema in the sigmoid mesocolon. Musculoskeletal: No worrisome lytic or sclerotic osseous abnormality. IMPRESSION: 1. Marked focal wall thickening in the mid sigmoid colon with substantial pericolonic edema/inflammation. Ill-defined 2.2 x 2.8 cm fluid collection adjacent to the colon contains a tiny gas locule. Imaging features are compatible with acute diverticulitis with evolving small diverticular abscess. The tiny gas locule could be due to infection or contained micro perforation. 2. Circumferential bladder wall thickening. This finding may be secondary to irritation from the adjacent inflammatory process but bladder infection is not excluded. 3. 2.9 cm cystic lesion in the upper pole left kidney is progressive since the 2021 exam and has evidence of an internal septation on today's study. 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 This MRI should be deferred to a nonemergent outpatient basis after patient has recovered from this acute process to best allow participation in positioning and reproducible breath holding. Electronically Signed   By: Camellia Candle M.D.   On: 08/25/2024 06:07      Assessment/Plan Diverticulitis with evolving small evolving diverticular abscess - Ct from 08/25/2024 marked focal wall thickening in the mid sigmoid colon with substantial pericolonic edema/inflammation. Ill-defined 2.2 x 2.8 cm fluid collection adjacent to the colon contains a tiny gas locule. Imaging features are  compatible with acute diverticulitis with evolving small diverticular abscess. The tiny gas locule could be due to infection or contained micro perforation. -Vitals stable. Afebrile. -CMP Abnormalities: K 3.2, Glucose 115, AST 12, Alk Pho 143. -WBC 15.2: HGB 13.3. -UA with ketones, protein, and rare bacteria.  -Blood cultures in process -Keep NPO -Discussed labs, imaging, and history that is consistent with acute  diverticulitis with concern of diverticular abscess. Will also discuss imaging with attending. We reviewed options of treatment. Recommend non-operative management at this time with IV antibiotics. Should patient symptoms worsen or not resolve, will consider repeat imaging. Patient will need follow up colonoscopy as an outpatient once treatment for acute process is complete.   Additional findings of CT: -Circumferential bladder wall thickening. This finding may be secondary to irritation from the adjacent  inflammatory process but bladder infection is not excluded. -2.9 cm cystic lesion in the upper pole left kidney is progressive since the 2021 exam and has evidence of an internal septation on today's study. 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 This MRI should be deferred to a nonemergent outpatient basis after patient has recovered from this acute process to best allow participation in positioning and reproducible breath holding.  FEN: NPO; IVF per primary team VTE: SCDs ID: Zosyn /Vancomycin   I reviewed hospitalist notes, last 24 h vitals and pain scores, last 48 h intake and output, last 24 h labs and trends, and last 24 h imaging results.  This care required high  level of medical decision making.   Marjorie Carlyon Favre, [facility] 08/25/2024, 7:12 AM Please see Amion for pager number during day hours 7:00am-4:30pm

## 2024-08-25 NOTE — Plan of Care (Signed)
  Problem: Education: Goal: Ability to describe self-care measures that may prevent or decrease complications (Diabetes Survival Skills Education) will improve Outcome: Progressing   Problem: Coping: Goal: Ability to adjust to condition or change in health will improve Outcome: Progressing

## 2024-08-25 NOTE — ED Triage Notes (Signed)
 PT states he has been having ABD pain  x3 days. PT states when he urinates it feels like its about to fall off and that his urine is reddish and is having loose stools.

## 2024-08-26 DIAGNOSIS — K5792 Diverticulitis of intestine, part unspecified, without perforation or abscess without bleeding: Secondary | ICD-10-CM | POA: Diagnosis not present

## 2024-08-26 LAB — GLUCOSE, CAPILLARY
Glucose-Capillary: 84 mg/dL (ref 70–99)
Glucose-Capillary: 85 mg/dL (ref 70–99)
Glucose-Capillary: 114 mg/dL — ABNORMAL HIGH (ref 70–99)
Glucose-Capillary: 120 mg/dL — ABNORMAL HIGH (ref 70–99)
Glucose-Capillary: 94 mg/dL (ref 70–99)

## 2024-08-26 LAB — HEMOGLOBIN A1C
Hgb A1c MFr Bld: 6.3 % — ABNORMAL HIGH (ref 4.8–5.6)
Mean Plasma Glucose: 134.11 mg/dL

## 2024-08-26 LAB — BASIC METABOLIC PANEL WITH GFR
Anion gap: 12 (ref 5–15)
BUN: 10 mg/dL (ref 6–20)
CO2: 21 mmol/L — ABNORMAL LOW (ref 22–32)
Calcium: 8.6 mg/dL — ABNORMAL LOW (ref 8.9–10.3)
Chloride: 104 mmol/L (ref 98–111)
Creatinine, Ser: 0.99 mg/dL (ref 0.61–1.24)
GFR, Estimated: >60 mL/min (ref >60)
Glucose, Bld: 83 mg/dL (ref 70–99)
Potassium: 3.8 mmol/L (ref 3.5–5.1)
Sodium: 137 mmol/L (ref 135–145)

## 2024-08-26 LAB — CBC
HCT: 40.7 % (ref 39.0–52.0)
Hemoglobin: 12.7 g/dL — ABNORMAL LOW (ref 13.0–17.0)
MCH: 27.3 pg (ref 26.0–34.0)
MCHC: 31.2 g/dL (ref 30.0–36.0)
MCV: 87.5 fL (ref 80.0–100.0)
Platelets: 284 K/uL (ref 150–400)
RBC: 4.65 MIL/uL (ref 4.22–5.81)
RDW: 13.4 % (ref 11.5–15.5)
WBC: 7.8 K/uL (ref 4.0–10.5)
nRBC: 0 % (ref 0.0–0.2)

## 2024-08-26 LAB — HIV ANTIBODY (ROUTINE TESTING W REFLEX): HIV Screen 4th Generation wRfx: NONREACTIVE

## 2024-08-26 NOTE — Progress Notes (Signed)
 PROGRESS NOTE    Logan Lucas  FMW:[id number] DOB: 04-02-1976 DOA: 08/25/2024 PCP: Patient, No Pcp Per   Brief Narrative:  Logan Lucas is a 48 y.o. male with medical history significant for asthma, hypertension, diet-controlled diabetes diagnosed with acute diverticulitis on 9/19 being admitted to the hospital with worsening abdominal pain and diverticulitis with abscess.  States that he has been compliant with oral antibiotics prescribed on 9/19, however he has had continued worsening left-sided abdominal pain.  He also has dysuria.  He has had subjective fevers, severe nausea without vomiting.  Denies diarrhea.   Assessment & Plan:   Principal Problem:   Acute diverticulitis Active Problems:   Diverticulitis of both large and small intestine with perforation with bleeding  Acute diverticulitis-complicated by possible perforation and small abscess. Patient does not meet sepsis criteria -Pain currently well-controlled, on clear liquid diet per general surgery -Empiric IV Zosyn  - follow cultures(preliminary unremarkable) -General Surgery consulted -appreciate insight recommendations may require repeat CT abdomen in the next 24 to 48 hours to ensure resolution/rule out worsening clinical status   Hypokalemia-resolved  Hypertension-currently stable, will monitor   Type 2 diabetes -diet controlled - Update A1c - 6.3 - Sliding scale insulin ; hypoglycemic protocol ongoing  DVT prophylaxis: SCDs Start: 08/25/24 0738 Code Status:   Code Status: Full Code Family Communication: None present  Status is: Inpatient  Dispo: The patient is from: Home              Anticipated d/c is to: Home              Anticipated d/c date is: 24 to 48 hours              Patient currently not medically stable for discharge  Consultants:  General Surgery  Procedures:  None  Antimicrobials:  Zosyn   Subjective: No acute issues or events overnight denies nausea vomiting diarrhea  constipation headache fevers chills or chest pain.  Abdominal pain markedly improving but not yet resolved  Objective: Vitals:   08/25/24 1302 08/25/24 1833 08/25/24 2005 08/26/24 0140  BP: 112/70 110/72 115/73 113/66  Pulse: 63 71 64 71  Resp: 14 16 16 16   Temp: 98 F (36.7 C) 98.3 F (36.8 C) 98.6 F (37 C) 98.2 F (36.8 C)  TempSrc: Oral Oral Oral   SpO2: 96% 98% 98% 100%    Intake/Output Summary (Last 24 hours) at 08/26/2024 0726 Last data filed at 08/26/2024 0600 Gross per 24 hour  Intake 900.66 ml  Output 0 ml  Net 900.66 ml   There were no vitals filed for this visit.  Examination:  General:  Pleasantly resting in bed, No acute distress. HEENT:  Normocephalic atraumatic.  Sclerae nonicteric, noninjected.  Extraocular movements intact bilaterally. Neck:  Without mass or deformity.  Trachea is midline. Lungs:  Clear to auscultate bilaterally without rhonchi, wheeze, or rales. Heart:  Regular rate and rhythm.  Without murmurs, rubs, or gallops. Abdomen:  Soft, minimally tender to deep palpation left lower quadrant without guarding or rebound. Extremities: Without cyanosis, clubbing, edema, or obvious deformity. Skin:  Warm and dry, no erythema.  Data Reviewed: I have personally reviewed following labs and imaging studies  CBC: Recent Labs  Lab 08/25/24 0323 08/26/24 0436  WBC 15.2* 7.8  HGB 13.3 12.7*  HCT 43.7 40.7  MCV 84.9 87.5  PLT 314 284   Basic Metabolic Panel: Recent Labs  Lab 08/25/24 0323 08/26/24 0436  NA 139 137  K 3.2* 3.8  CL  104 104  CO2 22 21*  GLUCOSE 115* 83  BUN 9 10  CREATININE 1.11 0.99  CALCIUM  9.1 8.6*   GFR: Estimated Creatinine Clearance: 112.8 mL/min (by C-G formula based on SCr of 0.99 mg/dL). Liver Function Tests: Recent Labs  Lab 08/25/24 0323  AST 12*  ALT 10  ALKPHOS 143*  BILITOT 0.7  PROT 7.1  ALBUMIN 3.8   HbA1C: Recent Labs    08/26/24 0436  HGBA1C 6.3*   CBG: Recent Labs  Lab 08/25/24 1207  08/25/24 1635 08/25/24 2002 08/25/24 2355 08/26/24 0409  GLUCAP 88 84 147* 83 84   No results found for this or any previous visit (from the past 240 hours).   Radiology Studies: CT ABDOMEN PELVIS W CONTRAST Result Date: 08/25/2024 CLINICAL DATA:  3 day history of abdominal pain. EXAM: CT ABDOMEN AND PELVIS WITH CONTRAST TECHNIQUE: Multidetector CT imaging of the abdomen and pelvis was performed using the standard protocol following bolus administration of intravenous contrast. RADIATION DOSE REDUCTION: This exam was performed according to the departmental dose-optimization program which includes automated exposure control, adjustment of the mA and/or kV according to patient size and/or use of iterative reconstruction technique. CONTRAST:  OMNIPAQUE  IOHEXOL  300 MG/ML  SOLN COMPARISON:  08/13/2024 FINDINGS: Lower chest: Unremarkable. Hepatobiliary: No suspicious focal abnormality within the liver parenchyma. There is no evidence for gallstones, gallbladder wall thickening, or pericholecystic fluid. No intrahepatic or extrahepatic biliary dilation. Pancreas: No focal mass lesion. No dilatation of the main duct. No intraparenchymal cyst. No peripancreatic edema. Spleen: No splenomegaly. No suspicious focal mass lesion. 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 No followup imaging is recommended. Right kidney unremarkable. 2.9 cm cystic lesion in the upper pole left kidney is progressive since the 2021 exam and has evidence of an internal septation on today's study. 2nd smaller cyst is identified in the anterior interpolar left kidney. No evidence for hydroureter. Bladder is decompressed with circumferential bladder wall thickening. Stomach/Bowel: Stomach is unremarkable. No gastric wall thickening. No evidence of outlet obstruction. Duodenum is normally  positioned as is the ligament of Treitz. No small bowel wall thickening. No small bowel dilatation. The terminal ileum is normal. The appendix is normal. A few diverticula are seen in the left colon. There is marked focal wall thickening in the mid sigmoid colon with substantial pericolonic edema/inflammation. Ill-defined 2.2 x 2.8 cm fluid collection adjacent to the colon (image 62/2) contains a tiny gas locule. These inflammatory changes are contiguous with the dome of the bladder. Vascular/Lymphatic: No abdominal aortic aneurysm. No abdominal aortic atherosclerotic calcification. There is no gastrohepatic or hepatoduodenal ligament lymphadenopathy. No retroperitoneal or mesenteric lymphadenopathy. No pelvic sidewall lymphadenopathy. Reproductive: The prostate gland and seminal vesicles are unremarkable. Other: Trace free fluid noted low left paracolic gutter with edema in the sigmoid mesocolon. Musculoskeletal: No worrisome lytic or sclerotic osseous abnormality. IMPRESSION: 1. Marked focal wall thickening in the mid sigmoid colon with substantial pericolonic edema/inflammation. Ill-defined 2.2 x 2.8 cm fluid collection adjacent to the colon contains a tiny gas locule. Imaging features are compatible with acute diverticulitis with evolving small diverticular abscess. The tiny gas locule could be due to infection or contained micro perforation. 2. Circumferential bladder wall thickening. This finding may be secondary to irritation from the adjacent inflammatory process but bladder infection is not excluded. 3. 2.9 cm cystic lesion in the upper pole left kidney is progressive  since the 2021 exam and has evidence of an internal septation on today's study. 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 This MRI should be deferred to a nonemergent outpatient basis after patient has recovered from this acute process to best allow participation in  positioning and reproducible breath holding. Electronically Signed   By: Camellia Candle M.D.   On: 08/25/2024 06:07        Scheduled Meds:  insulin  aspart  0-15 Units Subcutaneous Q4H   Continuous Infusions:  piperacillin -tazobactam (ZOSYN )  IV 3.375 g (08/26/24 0523)     LOS: 1 day    Time spent:    Elsie JAYSON Montclair, DO [facility]  If 7PM-7AM, please contact night-coverage [email or web address]  08/26/2024, 7:26 AM

## 2024-08-26 NOTE — Plan of Care (Signed)
   Problem: Education: Goal: Ability to describe self-care measures that may prevent or decrease complications (Diabetes Survival Skills Education) will improve Outcome: Progressing   Problem: Coping: Goal: Ability to adjust to condition or change in health will improve Outcome: Progressing   Problem: Fluid Volume: Goal: Ability to maintain a balanced intake and output will improve Outcome: Progressing

## 2024-08-26 NOTE — Progress Notes (Signed)
 Progress Note     Subjective: Pt reports he is still having LLQ abdominal pain that radiates across lower abdomen, somewhat better than initial presentation. Denies nausea or vomiting. Having some bowel function but not passing a lot of flatus.   Objective: Vital signs in last 24 hours: Temp:  [97.8 F (36.6 C)-98.6 F (37 C)] 97.8 F (36.6 C) (10/01 0943) Pulse Rate:  [62-71] 62 (10/01 0943) Resp:  [14-18] 18 (10/01 0943) BP: (108-115)/(66-73) 108/67 (10/01 0943) SpO2:  [96 %-100 %] 99 % (10/01 0943) Last BM Date : 08/25/24  Intake/Output from previous day: 09/30 0701 - 10/01 0700 In: 950.7 [P.O.:795; IV Piggyback:155.7] Out: 0  Intake/Output this shift: Total I/O In: 240 [P.O.:240] Out: 0   PE: General: pleasant, WD, obese male who is laying in bed in NAD Heart: regular, rate, and rhythm.   Lungs: Respiratory effort nonlabored Abd: soft, moderately ttp in LLQ without peritonitis, mild distention Psych: A&Ox3 with an appropriate affect.    Lab Results:  Recent Labs    08/25/24 0323 08/26/24 0436  WBC 15.2* 7.8  HGB 13.3 12.7*  HCT 43.7 40.7  PLT 314 284   BMET Recent Labs    08/25/24 0323 08/26/24 0436  NA 139 137  K 3.2* 3.8  CL 104 104  CO2 22 21*  GLUCOSE 115* 83  BUN 9 10  CREATININE 1.11 0.99  CALCIUM  9.1 8.6*   PT/INR No results for input(s): LABPROT, INR in the last 72 hours. CMP     Component Value Date/Time   NA 137 08/26/2024 0436   NA 143 07/25/2014 1105   K 3.8 08/26/2024 0436   K 4.0 07/25/2014 1105   CL 104 08/26/2024 0436   CL 108 (H) 07/25/2014 1105   CO2 21 (L) 08/26/2024 0436   CO2 23 07/25/2014 1105   GLUCOSE 83 08/26/2024 0436   GLUCOSE 76 07/25/2014 1105   BUN 10 08/26/2024 0436   BUN 8 07/25/2014 1105   CREATININE 0.99 08/26/2024 0436   CREATININE 1.26 07/25/2014 1105   CALCIUM  8.6 (L) 08/26/2024 0436   CALCIUM  8.8 07/25/2014 1105   PROT 7.1 08/25/2024 0323   PROT 7.7 02/10/2014 0521   ALBUMIN 3.8  08/25/2024 0323   ALBUMIN 4.2 02/10/2014 0521   AST 12 (L) 08/25/2024 0323   AST 31 02/10/2014 0521   ALT 10 08/25/2024 0323   ALT 35 02/10/2014 0521   ALKPHOS 143 (H) 08/25/2024 0323   ALKPHOS 112 02/10/2014 0521   BILITOT 0.7 08/25/2024 0323   BILITOT 0.3 02/10/2014 0521   GFRNONAA >60 08/26/2024 0436   GFRNONAA >60 07/25/2014 1105   GFRAA >60 08/11/2020 0115   GFRAA >60 07/25/2014 1105   Lipase     Component Value Date/Time   LIPASE 48 08/13/2024 2000       Studies/Results: CT ABDOMEN PELVIS W CONTRAST Result Date: 08/25/2024 CLINICAL DATA:  3 day history of abdominal pain. EXAM: CT ABDOMEN AND PELVIS WITH CONTRAST TECHNIQUE: Multidetector CT imaging of the abdomen and pelvis was performed using the standard protocol following bolus administration of intravenous contrast. RADIATION DOSE REDUCTION: This exam was performed according to the departmental dose-optimization program which includes automated exposure control, adjustment of the mA and/or kV according to patient size and/or use of iterative reconstruction technique. CONTRAST:  OMNIPAQUE  IOHEXOL  300 MG/ML  SOLN COMPARISON:  08/13/2024 FINDINGS: Lower chest: Unremarkable. Hepatobiliary: No suspicious focal abnormality within the liver parenchyma. There is no evidence for gallstones, gallbladder wall thickening, or  pericholecystic fluid. No intrahepatic or extrahepatic biliary dilation. Pancreas: No focal mass lesion. No dilatation of the main duct. No intraparenchymal cyst. No peripancreatic edema. Spleen: No splenomegaly. No suspicious focal mass lesion. 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 No followup imaging is recommended. Right kidney unremarkable. 2.9 cm cystic lesion in the upper pole left kidney is progressive since the 2021 exam and has evidence of an internal  septation on today's study. 2nd smaller cyst is identified in the anterior interpolar left kidney. No evidence for hydroureter. Bladder is decompressed with circumferential bladder wall thickening. Stomach/Bowel: Stomach is unremarkable. No gastric wall thickening. No evidence of outlet obstruction. Duodenum is normally positioned as is the ligament of Treitz. No small bowel wall thickening. No small bowel dilatation. The terminal ileum is normal. The appendix is normal. A few diverticula are seen in the left colon. There is marked focal wall thickening in the mid sigmoid colon with substantial pericolonic edema/inflammation. Ill-defined 2.2 x 2.8 cm fluid collection adjacent to the colon (image 62/2) contains a tiny gas locule. These inflammatory changes are contiguous with the dome of the bladder. Vascular/Lymphatic: No abdominal aortic aneurysm. No abdominal aortic atherosclerotic calcification. There is no gastrohepatic or hepatoduodenal ligament lymphadenopathy. No retroperitoneal or mesenteric lymphadenopathy. No pelvic sidewall lymphadenopathy. Reproductive: The prostate gland and seminal vesicles are unremarkable. Other: Trace free fluid noted low left paracolic gutter with edema in the sigmoid mesocolon. Musculoskeletal: No worrisome lytic or sclerotic osseous abnormality. IMPRESSION: 1. Marked focal wall thickening in the mid sigmoid colon with substantial pericolonic edema/inflammation. Ill-defined 2.2 x 2.8 cm fluid collection adjacent to the colon contains a tiny gas locule. Imaging features are compatible with acute diverticulitis with evolving small diverticular abscess. The tiny gas locule could be due to infection or contained micro perforation. 2. Circumferential bladder wall thickening. This finding may be secondary to irritation from the adjacent inflammatory process but bladder infection is not excluded. 3. 2.9 cm cystic lesion in the upper pole left kidney is progressive since the 2021 exam and  has evidence of an internal septation on today's study. 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 This MRI should be deferred to a nonemergent outpatient basis after patient has recovered from this acute process to best allow participation in positioning and reproducible breath holding. Electronically Signed   By: Camellia Candle M.D.   On: 08/25/2024 06:07    Anti-infectives: Anti-infectives (From admission, onward)    Start     Dose/Rate Route Frequency Ordered Stop   08/25/24 1200  piperacillin -tazobactam (ZOSYN ) IVPB 3.375 g        3.375 g 12.5 mL/hr over 240 Minutes Intravenous Every 8 hours 08/25/24 0744     08/25/24 0630  vancomycin (VANCOCIN) IVPB 1000 mg/200 mL premix  Status:  Discontinued        1,000 mg 200 mL/hr over 60 Minutes Intravenous  Once 08/25/24 0621 08/25/24 0623   08/25/24 0630  piperacillin -tazobactam (ZOSYN ) IVPB 3.375 g        3.375 g 100 mL/hr over 30 Minutes Intravenous  Once 08/25/24 0621 08/25/24 0722   08/25/24 0630  vancomycin (VANCOREADY) IVPB 2000 mg/400 mL        2,000 mg 200 mL/hr over 120 Minutes Intravenous  Once 08/25/24 9376 08/26/24 0731        Assessment/Plan Diverticulitis  with evolving small evolving diverticular abscess - CT from 08/25/2024 marked focal wall thickening in the mid sigmoid colon with substantial pericolonic edema/inflammation. Ill-defined 2.2 x 2.8 cm fluid collection adjacent to the colon contains a tiny gas locule. Imaging features are compatible with acute diverticulitis with evolving small diverticular abscess. The tiny gas locule could be due to infection or contained micro perforation. - Vitals stable. Afebrile. - WBC normalized from 15.2 - still having pretty significant TTP of LLQ today, would keep on CLD today but possibly advance tomorrow if pain improving. We will continue to follow - no indication for emergent surgical intervention at this  time     FEN: CLD; IVF per primary team VTE: SCDs, ok to have SQH or LMWH from surgery standpoint  ID: Zosyn  9/30>>  - per [facility] -  T2DM HTN Hx of GIB Asthma     LOS: 1 day   I reviewed hospitalist notes, last 24 h vitals and pain scores, last 48 h intake and output, last 24 h labs and trends, and last 24 h imaging results.  This care required moderate level of medical decision making.    Burnard JONELLE Louder, [facility] 08/26/2024, 11:07 AM Please see Amion for pager number during day hours 7:00am-4:30pm

## 2024-08-27 DIAGNOSIS — K5792 Diverticulitis of intestine, part unspecified, without perforation or abscess without bleeding: Secondary | ICD-10-CM | POA: Diagnosis not present

## 2024-08-27 LAB — BASIC METABOLIC PANEL WITH GFR
Anion gap: 12 (ref 5–15)
BUN: 8 mg/dL (ref 6–20)
CO2: 23 mmol/L (ref 22–32)
Calcium: 8.7 mg/dL — ABNORMAL LOW (ref 8.9–10.3)
Chloride: 103 mmol/L (ref 98–111)
Creatinine, Ser: 0.98 mg/dL (ref 0.61–1.24)
GFR, Estimated: >60 mL/min (ref >60)
Glucose, Bld: 91 mg/dL (ref 70–99)
Potassium: 3.8 mmol/L (ref 3.5–5.1)
Sodium: 137 mmol/L (ref 135–145)

## 2024-08-27 LAB — GLUCOSE, CAPILLARY
Glucose-Capillary: 104 mg/dL — ABNORMAL HIGH (ref 70–99)
Glucose-Capillary: 149 mg/dL — ABNORMAL HIGH (ref 70–99)
Glucose-Capillary: 157 mg/dL — ABNORMAL HIGH (ref 70–99)
Glucose-Capillary: 172 mg/dL — ABNORMAL HIGH (ref 70–99)
Glucose-Capillary: 88 mg/dL (ref 70–99)
Glucose-Capillary: 97 mg/dL (ref 70–99)
Glucose-Capillary: 98 mg/dL (ref 70–99)

## 2024-08-27 LAB — CBC
HCT: 42 % (ref 39.0–52.0)
Hemoglobin: 12.8 g/dL — ABNORMAL LOW (ref 13.0–17.0)
MCH: 26.6 pg (ref 26.0–34.0)
MCHC: 30.5 g/dL (ref 30.0–36.0)
MCV: 87.3 fL (ref 80.0–100.0)
Platelets: 327 K/uL (ref 150–400)
RBC: 4.81 MIL/uL (ref 4.22–5.81)
RDW: 13.1 % (ref 11.5–15.5)
WBC: 6.2 K/uL (ref 4.0–10.5)
nRBC: 0 % (ref 0.0–0.2)

## 2024-08-27 MED ORDER — TRAMADOL HCL 50 MG PO TABS
50.0000 mg | ORAL_TABLET | Freq: Four times a day (QID) | ORAL | Status: DC | PRN
  Administered 2024-08-27: 50 mg via ORAL
  Filled 2024-08-27: qty 1

## 2024-08-27 NOTE — Progress Notes (Signed)
 Progress Note     Subjective: Pt reports he is still having some lower abdominal pain but less severe than yesterday. He feels hungry this AM. Having bowel function. He did report some dysuria overnight and urine is dark.   Objective: Vital signs in last 24 hours: Temp:  [98.1 F (36.7 C)-98.4 F (36.9 C)] 98.1 F (36.7 C) (10/02 0518) Pulse Rate:  [59-64] 63 (10/02 0518) Resp:  [18] 18 (10/02 0518) BP: (116)/(68-82) 116/68 (10/02 0518) SpO2:  [98 %-100 %] 98 % (10/02 0518) Last BM Date : 08/25/24  Intake/Output from previous day: 10/01 0701 - 10/02 0700 In: 988.7 [P.O.:840; IV Piggyback:148.7] Out: 700 [Urine:700] Intake/Output this shift: Total I/O In: 0  Out: 350 [Urine:350]  PE: General: pleasant, WD, obese male who is laying in bed in NAD Heart: regular, rate, and rhythm.   Lungs: Respiratory effort nonlabored Abd: soft, mildly ttp in LLQ without peritonitis, mild distention Psych: A&Ox3 with an appropriate affect.    Lab Results:  Recent Labs    08/26/24 0436 08/27/24 0430  WBC 7.8 6.2  HGB 12.7* 12.8*  HCT 40.7 42.0  PLT 284 327   BMET Recent Labs    08/26/24 0436 08/27/24 0430  NA 137 137  K 3.8 3.8  CL 104 103  CO2 21* 23  GLUCOSE 83 91  BUN 10 8  CREATININE 0.99 0.98  CALCIUM  8.6* 8.7*   PT/INR No results for input(s): LABPROT, INR in the last 72 hours. CMP     Component Value Date/Time   NA 137 08/27/2024 0430   NA 143 07/25/2014 1105   K 3.8 08/27/2024 0430   K 4.0 07/25/2014 1105   CL 103 08/27/2024 0430   CL 108 (H) 07/25/2014 1105   CO2 23 08/27/2024 0430   CO2 23 07/25/2014 1105   GLUCOSE 91 08/27/2024 0430   GLUCOSE 76 07/25/2014 1105   BUN 8 08/27/2024 0430   BUN 8 07/25/2014 1105   CREATININE 0.98 08/27/2024 0430   CREATININE 1.26 07/25/2014 1105   CALCIUM  8.7 (L) 08/27/2024 0430   CALCIUM  8.8 07/25/2014 1105   PROT 7.1 08/25/2024 0323   PROT 7.7 02/10/2014 0521   ALBUMIN 3.8 08/25/2024 0323   ALBUMIN 4.2  02/10/2014 0521   AST 12 (L) 08/25/2024 0323   AST 31 02/10/2014 0521   ALT 10 08/25/2024 0323   ALT 35 02/10/2014 0521   ALKPHOS 143 (H) 08/25/2024 0323   ALKPHOS 112 02/10/2014 0521   BILITOT 0.7 08/25/2024 0323   BILITOT 0.3 02/10/2014 0521   GFRNONAA >60 08/27/2024 0430   GFRNONAA >60 07/25/2014 1105   GFRAA >60 08/11/2020 0115   GFRAA >60 07/25/2014 1105   Lipase     Component Value Date/Time   LIPASE 48 08/13/2024 2000       Studies/Results: No results found.   Anti-infectives: Anti-infectives (From admission, onward)    Start     Dose/Rate Route Frequency Ordered Stop   08/25/24 1200  piperacillin -tazobactam (ZOSYN ) IVPB 3.375 g        3.375 g 12.5 mL/hr over 240 Minutes Intravenous Every 8 hours 08/25/24 0744     08/25/24 0630  vancomycin (VANCOCIN) IVPB 1000 mg/200 mL premix  Status:  Discontinued        1,000 mg 200 mL/hr over 60 Minutes Intravenous  Once 08/25/24 0621 08/25/24 0623   08/25/24 0630  piperacillin -tazobactam (ZOSYN ) IVPB 3.375 g        3.375 g 100 mL/hr over 30 Minutes  Intravenous  Once 08/25/24 0621 08/25/24 0722   08/25/24 0630  vancomycin (VANCOREADY) IVPB 2000 mg/400 mL        2,000 mg 200 mL/hr over 120 Minutes Intravenous  Once 08/25/24 9376 08/26/24 0731        Assessment/Plan Diverticulitis with small evolving diverticular abscess - CT from 08/25/2024 marked focal wall thickening in the mid sigmoid colon with substantial pericolonic edema/inflammation. Ill-defined 2.2 x 2.8 cm fluid collection adjacent to the colon contains a tiny gas locule. Imaging features are compatible with acute diverticulitis with evolving small diverticular abscess. The tiny gas locule could be due to infection or contained micro perforation. - Vitals stable. Afebrile. - WBC normalized from 15.2 - less TTP today - ok to advance to FLD this AM - no indication for emergent surgical intervention at this time     FEN: FLD; IVF per primary team VTE: SCDs, ok  to have SQH or LMWH from surgery standpoint  ID: Zosyn  9/30>>  - per TRH -  T2DM HTN Hx of GIB Asthma     LOS: 2 days   I reviewed hospitalist notes, last 24 h vitals and pain scores, last 48 h intake and output, last 24 h labs and trends, and last 24 h imaging results.  This care required moderate level of medical decision making.    Burnard JONELLE Louder, [facility] Surgery 08/27/2024, 9:44 AM Please see Amion for pager number during day hours 7:00am-4:30pm

## 2024-08-27 NOTE — Progress Notes (Signed)
 PROGRESS NOTE    Logan Lucas  FMW:[id number] DOB: 1976-06-30 DOA: 08/25/2024 PCP: Patient, No Pcp Per   Brief Narrative:  Logan Lucas is a 48 y.o. male with medical history significant for asthma, hypertension, diet-controlled diabetes diagnosed with acute diverticulitis on 9/19 being admitted to the hospital with worsening abdominal pain and diverticulitis with abscess.  States that he has been compliant with oral antibiotics prescribed on 9/19, however he has had continued worsening left-sided abdominal pain.  He also has dysuria.  He has had subjective fevers, severe nausea without vomiting.  Denies diarrhea.   Assessment & Plan:   Principal Problem:   Acute diverticulitis Active Problems:   Diverticulitis of both large and small intestine with perforation with bleeding  Acute diverticulitis-complicated by possible perforation and small abscess. Patient does not meet sepsis criteria - Transiently worsening pain overnight - Empiric IV Zosyn  - follow cultures(preliminary unremarkable) - General Surgery consulted -appreciate insight recommendations - Repeat imaging in the next 24 to 48 hours per discussion with surgery   Dysuria  - Transient episode of mid abdominal pain with urination overnight x 1 - Denies hematuria or cloudy urine - UA at intake unremarkable  Hypokalemia-Resolved  Hypertension-currently stable, will monitor   Type 2 diabetes -diet controlled - Update A1c - 6.3 - Sliding scale insulin ; hypoglycemic protocol ongoing  DVT prophylaxis: SCDs Start: 08/25/24 0738 Code Status:   Code Status: Full Code Family Communication: None present  Status is: Inpatient  Dispo: The patient is from: Home              Anticipated d/c is to: Home              Anticipated d/c date is: 24 to 48 hours              Patient currently not medically stable for discharge  Consultants:  General Surgery  Procedures:  None  Antimicrobials:   Zosyn   Subjective: Worsening abdominal pain overnight, noted periumbilical after urination  Objective: Vitals:   08/26/24 0943 08/26/24 1401 08/26/24 2040 08/27/24 0518  BP: 108/67 116/76 116/82 116/68  Pulse: 62 64 (!) 59 63  Resp: 18 18 18 18   Temp: 97.8 F (36.6 C) 98.1 F (36.7 C) 98.4 F (36.9 C) 98.1 F (36.7 C)  TempSrc: Oral Oral Oral Oral  SpO2: 99% 99% 100% 98%    Intake/Output Summary (Last 24 hours) at 08/27/2024 0737 Last data filed at 08/27/2024 9266 Gross per 24 hour  Intake 988.67 ml  Output 1050 ml  Net -61.33 ml   There were no vitals filed for this visit.  Examination:  General:  Pleasantly resting in bed, No acute distress. HEENT:  Normocephalic atraumatic.  Sclerae nonicteric, noninjected.  Extraocular movements intact bilaterally. Neck:  Without mass or deformity.  Trachea is midline. Lungs:  Clear to auscultate bilaterally without rhonchi, wheeze, or rales. Heart:  Regular rate and rhythm.  Without murmurs, rubs, or gallops. Abdomen:  Soft, minimally tender to deep palpation left lower quadrant without guarding or rebound. Extremities: Without cyanosis, clubbing, edema, or obvious deformity. Skin:  Warm and dry, no erythema.  Data Reviewed: I have personally reviewed following labs and imaging studies  CBC: Recent Labs  Lab 08/25/24 0323 08/26/24 0436 08/27/24 0430  WBC 15.2* 7.8 6.2  HGB 13.3 12.7* 12.8*  HCT 43.7 40.7 42.0  MCV 84.9 87.5 87.3  PLT 314 284 327   Basic Metabolic Panel: Recent Labs  Lab 08/25/24 0323 08/26/24 0436 08/27/24 0430  NA 139 137 137  K 3.2* 3.8 3.8  CL 104 104 103  CO2 22 21* 23  GLUCOSE 115* 83 91  BUN 9 10 8   CREATININE 1.11 0.99 0.98  CALCIUM  9.1 8.6* 8.7*   GFR: Estimated Creatinine Clearance: 113.9 mL/min (by C-G formula based on SCr of 0.98 mg/dL). Liver Function Tests: Recent Labs  Lab 08/25/24 0323  AST 12*  ALT 10  ALKPHOS 143*  BILITOT 0.7  PROT 7.1  ALBUMIN 3.8    HbA1C: Recent Labs    08/26/24 0436  HGBA1C 6.3*   CBG: Recent Labs  Lab 08/26/24 1553 08/26/24 2042 08/27/24 0037 08/27/24 0517 08/27/24 0731  GLUCAP 120* 94 98 104* 97   No results found for this or any previous visit (from the past 240 hours).   Radiology Studies: No results found.       Scheduled Meds:  insulin  aspart  0-15 Units Subcutaneous Q4H   Continuous Infusions:  piperacillin -tazobactam (ZOSYN )  IV 3.375 g (08/27/24 0535)     LOS: 2 days    Time spent:    Elsie JAYSON Montclair, DO [facility]  If 7PM-7AM, please contact night-coverage [email or web address]  08/27/2024, 7:37 AM

## 2024-08-27 NOTE — Plan of Care (Signed)
   Problem: Coping: Goal: Ability to adjust to condition or change in health will improve Outcome: Progressing   Problem: Fluid Volume: Goal: Ability to maintain a balanced intake and output will improve Outcome: Progressing   Problem: Metabolic: Goal: Ability to maintain appropriate glucose levels will improve Outcome: Progressing

## 2024-08-28 DIAGNOSIS — K5792 Diverticulitis of intestine, part unspecified, without perforation or abscess without bleeding: Secondary | ICD-10-CM | POA: Diagnosis not present

## 2024-08-28 LAB — CBC
HCT: 41.4 % (ref 39.0–52.0)
Hemoglobin: 13 g/dL (ref 13.0–17.0)
MCH: 26.9 pg (ref 26.0–34.0)
MCHC: 31.4 g/dL (ref 30.0–36.0)
MCV: 85.7 fL (ref 80.0–100.0)
Platelets: 361 K/uL (ref 150–400)
RBC: 4.83 MIL/uL (ref 4.22–5.81)
RDW: 13 % (ref 11.5–15.5)
WBC: 5.5 K/uL (ref 4.0–10.5)
nRBC: 0 % (ref 0.0–0.2)

## 2024-08-28 LAB — GLUCOSE, CAPILLARY
Glucose-Capillary: 103 mg/dL — ABNORMAL HIGH (ref 70–99)
Glucose-Capillary: 111 mg/dL — ABNORMAL HIGH (ref 70–99)
Glucose-Capillary: 138 mg/dL — ABNORMAL HIGH (ref 70–99)
Glucose-Capillary: 139 mg/dL — ABNORMAL HIGH (ref 70–99)
Glucose-Capillary: 172 mg/dL — ABNORMAL HIGH (ref 70–99)
Glucose-Capillary: 187 mg/dL — ABNORMAL HIGH (ref 70–99)

## 2024-08-28 LAB — BASIC METABOLIC PANEL WITH GFR
Anion gap: 11 (ref 5–15)
BUN: 9 mg/dL (ref 6–20)
CO2: 24 mmol/L (ref 22–32)
Calcium: 8.8 mg/dL — ABNORMAL LOW (ref 8.9–10.3)
Chloride: 106 mmol/L (ref 98–111)
Creatinine, Ser: 1.02 mg/dL (ref 0.61–1.24)
GFR, Estimated: >60 mL/min (ref >60)
Glucose, Bld: 149 mg/dL — ABNORMAL HIGH (ref 70–99)
Potassium: 4.2 mmol/L (ref 3.5–5.1)
Sodium: 140 mmol/L (ref 135–145)

## 2024-08-28 MED ORDER — OXYCODONE HCL 5 MG PO TABS
5.0000 mg | ORAL_TABLET | ORAL | Status: DC | PRN
  Administered 2024-08-28 – 2024-08-31 (×9): 5 mg via ORAL
  Filled 2024-08-28 (×10): qty 1

## 2024-08-28 NOTE — Progress Notes (Signed)
 PROGRESS NOTE    Logan Lucas  FMW:[id number] DOB: [birth year] DOA: 08/25/2024 PCP: Patient, No Pcp Per   Brief Narrative:  Logan Lucas is a 48 y.o. male with medical history significant for asthma, hypertension, diet-controlled diabetes diagnosed with acute diverticulitis on 9/19 being admitted to the hospital with worsening abdominal pain and diverticulitis with abscess.  States that he has been compliant with oral antibiotics prescribed on 9/19, however he has had continued worsening left-sided abdominal pain.  He also has dysuria.  He has had subjective fevers, severe nausea without vomiting.  Denies diarrhea.   Assessment & Plan:   Principal Problem:   Acute diverticulitis Active Problems:   Diverticulitis of both large and small intestine with perforation with bleeding  Acute diverticulitis-complicated by possible perforation and small abscess. Patient does not meet sepsis criteria - Empiric IV Zosyn  - follow cultures(preliminary unremarkable) - General Surgery consulted -appreciate insight recommendations - Repeat imaging in the next 24 to 48 hours per discussion with surgery  - Advance diet slowly  Dysuria, resolved  - Transient episode of mid abdominal pain with urination overnight on 2nd - Denies hematuria or cloudy urine - UA at intake unremarkable  Hypokalemia-Resolved  Hypertension-currently stable, will monitor   Type 2 diabetes -diet controlled - Updated A1c - 6.3 - Sliding scale insulin ; hypoglycemic protocol ongoing  DVT prophylaxis: SCDs Start: 08/25/24 0738 Code Status:   Code Status: Full Code Family Communication: None present  Status is: Inpatient  Dispo: The patient is from: Home              Anticipated d/c is to: Home              Anticipated d/c date is: 24 to 48 hours              Patient currently not medically stable for discharge  Consultants:  General Surgery  Procedures:  None  Antimicrobials:  Zosyn   Subjective: No  acute issue/events overnight - pain improving - hopeful to advance diet some today which is reasonable  Objective: Vitals:   08/27/24 0518 08/27/24 1352 08/27/24 2112 08/28/24 0601  BP: 116/68 117/79 107/69 125/78  Pulse: 63 (!) 59 (!) 56 63  Resp: 18 17 15 16   Temp: 98.1 F (36.7 C) 98.6 F (37 C) 97.8 F (36.6 C) 98 F (36.7 C)  TempSrc: Oral Oral Oral Oral  SpO2: 98% 100% 97% 99%    Intake/Output Summary (Last 24 hours) at 08/28/2024 0749 Last data filed at 08/28/2024 0602 Gross per 24 hour  Intake 1892.58 ml  Output 1250 ml  Net 642.58 ml   There were no vitals filed for this visit.  Examination:  General:  Pleasantly resting in bed, No acute distress. HEENT:  Normocephalic atraumatic.  Sclerae nonicteric, noninjected.  Extraocular movements intact bilaterally. Neck:  Without mass or deformity.  Trachea is midline. Lungs:  Clear to auscultate bilaterally without rhonchi, wheeze, or rales. Heart:  Regular rate and rhythm.  Without murmurs, rubs, or gallops. Abdomen:  Soft, minimally tender to deep palpation left lower quadrant without guarding or rebound. Extremities: Without cyanosis, clubbing, edema, or obvious deformity. Skin:  Warm and dry, no erythema.  Data Reviewed: I have personally reviewed following labs and imaging studies  CBC: Recent Labs  Lab 08/25/24 0323 08/26/24 0436 08/27/24 0430 08/28/24 0431  WBC 15.2* 7.8 6.2 5.5  HGB 13.3 12.7* 12.8* 13.0  HCT 43.7 40.7 42.0 41.4  MCV 84.9 87.5 87.3 85.7  PLT 314 284  327 361   Basic Metabolic Panel: Recent Labs  Lab 08/25/24 0323 08/26/24 0436 08/27/24 0430 08/28/24 0431  NA 139 137 137 140  K 3.2* 3.8 3.8 4.2  CL 104 104 103 106  CO2 22 21* 23 24  GLUCOSE 115* 83 91 149*  BUN 9 10 8 9   CREATININE 1.11 0.99 0.98 1.02  CALCIUM  9.1 8.6* 8.7* 8.8*   GFR: CrCl cannot be calculated (Unknown ideal weight.). Liver Function Tests: Recent Labs  Lab 08/25/24 0323  AST 12*  ALT 10  ALKPHOS 143*   BILITOT 0.7  PROT 7.1  ALBUMIN 3.8   HbA1C: Recent Labs    08/26/24 0436  HGBA1C 6.3*   CBG: Recent Labs  Lab 08/27/24 1538 08/27/24 1956 08/27/24 2349 08/28/24 0357 08/28/24 0734  GLUCAP 88 157* 149* 187* 103*   No results found for this or any previous visit (from the past 240 hours).   Radiology Studies: No results found.       Scheduled Meds:  insulin  aspart  0-15 Units Subcutaneous Q4H   Continuous Infusions:  piperacillin -tazobactam (ZOSYN )  IV 3.375 g (08/28/24 0524)     LOS: 3 days    Time spent:    Elsie JAYSON Montclair, DO [facility]  If 7PM-7AM, please contact night-coverage [email or web address]  08/28/2024, 7:49 AM

## 2024-08-28 NOTE — Progress Notes (Signed)
 Progress Note     Subjective: Pt reports some worsened pain with eating too fast yesterday after advancement to soft diet. He is taking it slower this AM and that seems to be better. Denies nausea or vomiting. He is still having some dysuria but it is overall improving, urine is gradually lightening in color.   Objective: Vital signs in last 24 hours: Temp:  [97.8 F (36.6 C)-98.6 F (37 C)] 98 F (36.7 C) (10/03 0601) Pulse Rate:  [56-63] 63 (10/03 0601) Resp:  [15-17] 16 (10/03 0601) BP: (107-125)/(69-79) 125/78 (10/03 0601) SpO2:  [97 %-100 %] 99 % (10/03 0601) Last BM Date : 08/27/24  Intake/Output from previous day: 10/02 0701 - 10/03 0700 In: 1892.6 [P.O.:1740; IV Piggyback:152.6] Out: 1600 [Urine:1600] Intake/Output this shift: No intake/output data recorded.  PE: General: pleasant, WD, obese male who is laying in bed in NAD Heart: regular, rate, and rhythm.   Lungs: Respiratory effort nonlabored Abd: soft, mildly ttp in LLQ and suprapubic abdomen without peritonitis, some voluntary guarding, mild distention Psych: A&Ox3 with an appropriate affect.    Lab Results:  Recent Labs    08/27/24 0430 08/28/24 0431  WBC 6.2 5.5  HGB 12.8* 13.0  HCT 42.0 41.4  PLT 327 361   BMET Recent Labs    08/27/24 0430 08/28/24 0431  NA 137 140  K 3.8 4.2  CL 103 106  CO2 23 24  GLUCOSE 91 149*  BUN 8 9  CREATININE 0.98 1.02  CALCIUM  8.7* 8.8*   PT/INR No results for input(s): LABPROT, INR in the last 72 hours. CMP     Component Value Date/Time   NA 140 08/28/2024 0431   NA 143 07/25/2014 1105   K 4.2 08/28/2024 0431   K 4.0 07/25/2014 1105   CL 106 08/28/2024 0431   CL 108 (H) 07/25/2014 1105   CO2 24 08/28/2024 0431   CO2 23 07/25/2014 1105   GLUCOSE 149 (H) 08/28/2024 0431   GLUCOSE 76 07/25/2014 1105   BUN 9 08/28/2024 0431   BUN 8 07/25/2014 1105   CREATININE 1.02 08/28/2024 0431   CREATININE 1.26 07/25/2014 1105   CALCIUM  8.8 (L) 08/28/2024  0431   CALCIUM  8.8 07/25/2014 1105   PROT 7.1 08/25/2024 0323   PROT 7.7 02/10/2014 0521   ALBUMIN 3.8 08/25/2024 0323   ALBUMIN 4.2 02/10/2014 0521   AST 12 (L) 08/25/2024 0323   AST 31 02/10/2014 0521   ALT 10 08/25/2024 0323   ALT 35 02/10/2014 0521   ALKPHOS 143 (H) 08/25/2024 0323   ALKPHOS 112 02/10/2014 0521   BILITOT 0.7 08/25/2024 0323   BILITOT 0.3 02/10/2014 0521   GFRNONAA >60 08/28/2024 0431   GFRNONAA >60 07/25/2014 1105   GFRAA >60 08/11/2020 0115   GFRAA >60 07/25/2014 1105   Lipase     Component Value Date/Time   LIPASE 48 08/13/2024 2000       Studies/Results: No results found.   Anti-infectives: Anti-infectives (From admission, onward)    Start     Dose/Rate Route Frequency Ordered Stop   08/25/24 1200  piperacillin -tazobactam (ZOSYN ) IVPB 3.375 g        3.375 g 12.5 mL/hr over 240 Minutes Intravenous Every 8 hours 08/25/24 0744     08/25/24 0630  vancomycin (VANCOCIN) IVPB 1000 mg/200 mL premix  Status:  Discontinued        1,000 mg 200 mL/hr over 60 Minutes Intravenous  Once 08/25/24 0621 08/25/24 0623   08/25/24 0630  piperacillin -tazobactam (ZOSYN ) IVPB 3.375 g        3.375 g 100 mL/hr over 30 Minutes Intravenous  Once 08/25/24 0621 08/25/24 0722   08/25/24 0630  vancomycin (VANCOREADY) IVPB 2000 mg/400 mL        2,000 mg 200 mL/hr over 120 Minutes Intravenous  Once 08/25/24 9376 08/26/24 0731        Assessment/Plan Diverticulitis with small evolving diverticular abscess - CT from 08/25/2024 marked focal wall thickening in the mid sigmoid colon with substantial pericolonic edema/inflammation. Ill-defined 2.2 x 2.8 cm fluid collection adjacent to the colon contains a tiny gas locule. Imaging features are compatible with acute diverticulitis with evolving small diverticular abscess. The tiny gas locule could be due to infection or contained micro perforation. - Vitals stable. Afebrile. - WBC normalized from 15.2 - on low fiber diet -  continue to monitor, if still having pain with PO intake tomorrow and significant dysuria would consider repeat CT over the weekend with IV/PO contrast to re-evaluate pelvic collection  - no indication for emergent surgical intervention at this time     FEN: low fiber; IVF per primary team VTE: SCDs, ok to have SQH or LMWH from surgery standpoint  ID: Zosyn  9/30>>  - per TRH -  T2DM HTN Hx of GIB Asthma     LOS: 3 days   I reviewed hospitalist notes, last 24 h vitals and pain scores, last 48 h intake and output, last 24 h labs and trends, and last 24 h imaging results.  This care required moderate level of medical decision making.    Burnard JONELLE Louder, [facility] 08/28/2024, 8:14 AM Please see Amion for pager number during day hours 7:00am-4:30pm

## 2024-08-28 NOTE — Plan of Care (Signed)
  Problem: Coping: Goal: Ability to adjust to condition or change in health will improve Outcome: Progressing   Problem: Fluid Volume: Goal: Ability to maintain a balanced intake and output will improve Outcome: Progressing   Problem: Health Behavior/Discharge Planning: Goal: Ability to manage health-related needs will improve Outcome: Progressing

## 2024-08-29 ENCOUNTER — Inpatient Hospital Stay (HOSPITAL_COMMUNITY)

## 2024-08-29 DIAGNOSIS — K5792 Diverticulitis of intestine, part unspecified, without perforation or abscess without bleeding: Secondary | ICD-10-CM | POA: Diagnosis not present

## 2024-08-29 LAB — CBC
HCT: 42.2 % (ref 39.0–52.0)
Hemoglobin: 12.8 g/dL — ABNORMAL LOW (ref 13.0–17.0)
MCH: 26.1 pg (ref 26.0–34.0)
MCHC: 30.3 g/dL (ref 30.0–36.0)
MCV: 86.1 fL (ref 80.0–100.0)
Platelets: 360 K/uL (ref 150–400)
RBC: 4.9 MIL/uL (ref 4.22–5.81)
RDW: 13.2 % (ref 11.5–15.5)
WBC: 6.7 K/uL (ref 4.0–10.5)
nRBC: 0 % (ref 0.0–0.2)

## 2024-08-29 LAB — BASIC METABOLIC PANEL WITH GFR
Anion gap: 9 (ref 5–15)
BUN: 10 mg/dL (ref 6–20)
CO2: 23 mmol/L (ref 22–32)
Calcium: 8.9 mg/dL (ref 8.9–10.3)
Chloride: 107 mmol/L (ref 98–111)
Creatinine, Ser: 1.13 mg/dL (ref 0.61–1.24)
GFR, Estimated: >60 mL/min (ref >60)
Glucose, Bld: 165 mg/dL — ABNORMAL HIGH (ref 70–99)
Potassium: 4.1 mmol/L (ref 3.5–5.1)
Sodium: 139 mmol/L (ref 135–145)

## 2024-08-29 LAB — GLUCOSE, CAPILLARY
Glucose-Capillary: 169 mg/dL — ABNORMAL HIGH (ref 70–99)
Glucose-Capillary: 125 mg/dL — ABNORMAL HIGH (ref 70–99)
Glucose-Capillary: 113 mg/dL — ABNORMAL HIGH (ref 70–99)
Glucose-Capillary: 118 mg/dL — ABNORMAL HIGH (ref 70–99)
Glucose-Capillary: 125 mg/dL — ABNORMAL HIGH (ref 70–99)
Glucose-Capillary: 119 mg/dL — ABNORMAL HIGH (ref 70–99)

## 2024-08-29 MED ORDER — IOHEXOL 300 MG/ML  SOLN
100.0000 mL | Freq: Once | INTRAMUSCULAR | Status: AC | PRN
  Administered 2024-08-29: 100 mL via INTRAVENOUS

## 2024-08-29 NOTE — Progress Notes (Signed)
 PROGRESS NOTE    Logan Lucas  FMW:[id number] DOB: [birth year] DOA: 08/25/2024 PCP: Patient, No Pcp Per   Brief Narrative:  Logan Lucas is a 48 y.o. male with medical history significant for asthma, hypertension, diet-controlled diabetes diagnosed with acute diverticulitis on 9/19 being admitted to the hospital with worsening abdominal pain and diverticulitis with abscess.  States that he has been compliant with oral antibiotics prescribed on 9/19, however he has had continued worsening left-sided abdominal pain.  He also has dysuria.  He has had subjective fevers, severe nausea without vomiting.  Denies diarrhea.   Assessment & Plan:   Principal Problem:   Acute diverticulitis Active Problems:   Diverticulitis of both large and small intestine with perforation with bleeding  Acute diverticulitis-complicated by possible perforation and small abscess. Patient does not meet sepsis criteria - Empiric IV Zosyn  - follow cultures(preliminary unremarkable) - General Surgery consulted -appreciate insight recommendations - Repeat CT pending - Advance diet slowly  Dysuria, resolved  - Transient episode of mid abdominal pain with urination overnight on 2nd - Denies hematuria or cloudy urine - UA at intake unremarkable  Hypokalemia-Resolved  Hypertension-currently stable, will monitor   Type 2 diabetes -diet controlled - Updated A1c - 6.3 - Sliding scale insulin ; hypoglycemic protocol ongoing  DVT prophylaxis: SCDs Start: 08/25/24 0738 Code Status:   Code Status: Full Code Family Communication: None present  Status is: Inpatient  Dispo: The patient is from: Home              Anticipated d/c is to: Home              Anticipated d/c date is: 24 to 48 hours              Patient currently not medically stable for discharge  Consultants:  General Surgery  Procedures:  None  Antimicrobials:  Zosyn   Subjective: No acute issue/events overnight - pain ongoing with no  real improvement over the past few days  Objective: Vitals:   08/28/24 0601 08/28/24 1551 08/28/24 2005 08/29/24 0503  BP: 125/78 115/77 130/78 122/61  Pulse: 63 (!) 58 66 66  Resp: 16 16 18 18   Temp: 98 F (36.7 C) 98.6 F (37 C) 98 F (36.7 C) 98.1 F (36.7 C)  TempSrc: Oral Oral Oral Oral  SpO2: 99% 98% 94% 100%    Intake/Output Summary (Last 24 hours) at 08/29/2024 0743 Last data filed at 08/29/2024 0603 Gross per 24 hour  Intake 1955.43 ml  Output 2350 ml  Net -394.57 ml   There were no vitals filed for this visit.  Examination:  General:  Pleasantly resting in bed, No acute distress. HEENT:  Normocephalic atraumatic.  Sclerae nonicteric, noninjected.  Extraocular movements intact bilaterally. Neck:  Without mass or deformity.  Trachea is midline. Lungs:  Clear to auscultate bilaterally without rhonchi, wheeze, or rales. Heart:  Regular rate and rhythm.  Without murmurs, rubs, or gallops. Abdomen:  Soft, minimally tender to deep palpation left lower quadrant without guarding or rebound. Extremities: Without cyanosis, clubbing, edema, or obvious deformity. Skin:  Warm and dry, no erythema.  Data Reviewed: I have personally reviewed following labs and imaging studies  CBC: Recent Labs  Lab 08/25/24 0323 08/26/24 0436 08/27/24 0430 08/28/24 0431 08/29/24 0501  WBC 15.2* 7.8 6.2 5.5 6.7  HGB 13.3 12.7* 12.8* 13.0 12.8*  HCT 43.7 40.7 42.0 41.4 42.2  MCV 84.9 87.5 87.3 85.7 86.1  PLT 314 284 327 361 360   Basic Metabolic  Panel: Recent Labs  Lab 08/25/24 0323 08/26/24 0436 08/27/24 0430 08/28/24 0431 08/29/24 0501  NA 139 137 137 140 139  K 3.2* 3.8 3.8 4.2 4.1  CL 104 104 103 106 107  CO2 22 21* 23 24 23   GLUCOSE 115* 83 91 149* 165*  BUN 9 10 8 9 10   CREATININE 1.11 0.99 0.98 1.02 1.13  CALCIUM  9.1 8.6* 8.7* 8.8* 8.9   GFR: CrCl cannot be calculated (Unknown ideal weight.). Liver Function Tests: Recent Labs  Lab 08/25/24 0323  AST 12*  ALT  10  ALKPHOS 143*  BILITOT 0.7  PROT 7.1  ALBUMIN 3.8   HbA1C: No results for input(s): HGBA1C in the last 72 hours.  CBG: Recent Labs  Lab 08/28/24 1127 08/28/24 1636 08/28/24 2001 08/28/24 2348 08/29/24 0408  GLUCAP 111* 139* 172* 138* 169*   Recent Results (from the past 240 hours)  Blood culture (routine x 2)     Status: None (Preliminary result)   Collection Time: 08/25/24  6:20 AM   Specimen: BLOOD  Result Value Ref Range Status   Specimen Description   Final    BLOOD LEFT ANTECUBITAL Performed at [facility], [address], [place], [place] [address]    Special Requests   Final    BOTTLES DRAWN AEROBIC AND ANAEROBIC Blood Culture adequate volume Performed at [facility], [address], [place], [place] [address]    Culture   Final    NO GROWTH 3 DAYS Performed at [facility], [address] [place], [place] [address]    Report Status PENDING  Incomplete  Blood culture (routine x 2)     Status: None (Preliminary result)   Collection Time: 08/25/24  9:15 AM   Specimen: BLOOD  Result Value Ref Range Status   Specimen Description   Final    BLOOD BLOOD RIGHT ARM Performed at [facility], [address] [place], [place] [address]    Special Requests   Final    BOTTLES DRAWN AEROBIC AND ANAEROBIC Blood Culture results may not be optimal due to an inadequate volume of blood received in culture bottles Performed at [facility], [address], [place], [place] [address]    Culture   Final    NO GROWTH 3 DAYS Performed at [facility], [address] [place], [place] [address]    Report Status PENDING  Incomplete     Radiology Studies: No results found.       Scheduled Meds:  insulin  aspart  0-15 Units Subcutaneous Q4H   Continuous Infusions:  piperacillin -tazobactam (ZOSYN )  IV 3.375 g (08/29/24 0503)     LOS: 4 days    Time spent:     Elsie JAYSON Montclair, DO [facility]  If 7PM-7AM, please contact night-coverage [email or web address]  08/29/2024, 7:43 AM

## 2024-08-29 NOTE — Progress Notes (Signed)
   Subjective/Chief Complaint: Still having central abdominal pain with a 9 out of 10 episode this morning follow by a bm   Objective: Vital signs in last 24 hours: Temp:  [97.8 F (36.6 C)-98.6 F (37 C)] 97.8 F (36.6 C) (10/04 0822) Pulse Rate:  [58-66] 59 (10/04 0822) Resp:  [14-18] 14 (10/04 0822) BP: (115-130)/(61-91) 127/91 (10/04 0822) SpO2:  [94 %-100 %] 100 % (10/04 0822) Last BM Date : 08/28/24  Intake/Output from previous day: 10/03 0701 - 10/04 0700 In: 1955.4 [P.O.:1800; IV Piggyback:155.4] Out: 2350 [Urine:2350] Intake/Output this shift: Total I/O In: 360 [P.O.:360] Out: 300 [Urine:300]  Exam: Awake and alert Looks comfortable Abdomen soft, obese, mildly tender to deep palpation  Lab Results:  Recent Labs    08/28/24 0431 08/29/24 0501  WBC 5.5 6.7  HGB 13.0 12.8*  HCT 41.4 42.2  PLT 361 360   BMET Recent Labs    08/28/24 0431 08/29/24 0501  NA 140 139  K 4.2 4.1  CL 106 107  CO2 24 23  GLUCOSE 149* 165*  BUN 9 10  CREATININE 1.02 1.13  CALCIUM  8.8* 8.9   PT/INR No results for input(s): LABPROT, INR in the last 72 hours. ABG No results for input(s): PHART, HCO3 in the last 72 hours.  Invalid input(s): PCO2, PO2  Studies/Results: No results found.  Anti-infectives: Anti-infectives (From admission, onward)    Start     Dose/Rate Route Frequency Ordered Stop   08/25/24 1200  piperacillin -tazobactam (ZOSYN ) IVPB 3.375 g        3.375 g 12.5 mL/hr over 240 Minutes Intravenous Every 8 hours 08/25/24 0744     08/25/24 0630  vancomycin (VANCOCIN) IVPB 1000 mg/200 mL premix  Status:  Discontinued        1,000 mg 200 mL/hr over 60 Minutes Intravenous  Once 08/25/24 0621 08/25/24 0623   08/25/24 0630  piperacillin -tazobactam (ZOSYN ) IVPB 3.375 g        3.375 g 100 mL/hr over 30 Minutes Intravenous  Once 08/25/24 0621 08/25/24 0722   08/25/24 0630  vancomycin (VANCOREADY) IVPB 2000 mg/400 mL        2,000 mg 200 mL/hr over  120 Minutes Intravenous  Once 08/25/24 9376 08/26/24 0731       Assessment/Plan: Diverticulitis with small evolving diverticular abscess - CT from 08/25/2024 marked focal wall thickening in the mid sigmoid colon with substantial pericolonic edema/inflammation. Ill-defined 2.2 x 2.8 cm fluid collection adjacent to the colon contains a tiny gas locule. Imaging features are compatible with acute diverticulitis with evolving small diverticular abscess. The tiny gas locule could be due to infection or contained micro perforation.  -WBC normal but still having significant intermittent abdominal pain.  Will order a repeat CT of the abd/pelvis to reassess the intra-abdominal abscess  Logan Lucas 08/29/2024

## 2024-08-30 DIAGNOSIS — K5792 Diverticulitis of intestine, part unspecified, without perforation or abscess without bleeding: Secondary | ICD-10-CM | POA: Diagnosis not present

## 2024-08-30 LAB — CBC
HCT: 44 % (ref 39.0–52.0)
Hemoglobin: 13.7 g/dL (ref 13.0–17.0)
MCH: 26.9 pg (ref 26.0–34.0)
MCHC: 31.1 g/dL (ref 30.0–36.0)
MCV: 86.3 fL (ref 80.0–100.0)
Platelets: 358 K/uL (ref 150–400)
RBC: 5.1 MIL/uL (ref 4.22–5.81)
RDW: 13.2 % (ref 11.5–15.5)
WBC: 7.6 K/uL (ref 4.0–10.5)
nRBC: 0 % (ref 0.0–0.2)

## 2024-08-30 LAB — BASIC METABOLIC PANEL WITH GFR
Anion gap: 11 (ref 5–15)
BUN: 13 mg/dL (ref 6–20)
CO2: 22 mmol/L (ref 22–32)
Calcium: 9.2 mg/dL (ref 8.9–10.3)
Chloride: 106 mmol/L (ref 98–111)
Creatinine, Ser: 1.06 mg/dL (ref 0.61–1.24)
GFR, Estimated: >60 mL/min (ref >60)
Glucose, Bld: 111 mg/dL — ABNORMAL HIGH (ref 70–99)
Potassium: 4.2 mmol/L (ref 3.5–5.1)
Sodium: 139 mmol/L (ref 135–145)

## 2024-08-30 LAB — CULTURE, BLOOD (ROUTINE X 2)
Culture: NO GROWTH
Culture: NO GROWTH
Special Requests: ADEQUATE

## 2024-08-30 LAB — GLUCOSE, CAPILLARY
Glucose-Capillary: 142 mg/dL — ABNORMAL HIGH (ref 70–99)
Glucose-Capillary: 107 mg/dL — ABNORMAL HIGH (ref 70–99)
Glucose-Capillary: 132 mg/dL — ABNORMAL HIGH (ref 70–99)
Glucose-Capillary: 113 mg/dL — ABNORMAL HIGH (ref 70–99)
Glucose-Capillary: 176 mg/dL — ABNORMAL HIGH (ref 70–99)

## 2024-08-30 NOTE — Progress Notes (Signed)
 PROGRESS NOTE    Logan Lucas  FMW:[id number] DOB: [birth year] DOA: 08/25/2024 PCP: Patient, No Pcp Per   Brief Narrative:  Logan Lucas is a 48 y.o. male with medical history significant for asthma, hypertension, diet-controlled diabetes diagnosed with acute diverticulitis on 9/19 being admitted to the hospital with worsening abdominal pain and diverticulitis with abscess.  States that he has been compliant with oral antibiotics prescribed on 9/19, however he has had continued worsening left-sided abdominal pain.  He also has dysuria.  He has had subjective fevers, severe nausea without vomiting.  Denies diarrhea.   Assessment & Plan:   Principal Problem:   Acute diverticulitis Active Problems:   Diverticulitis of both large and small intestine with perforation with bleeding  Acute diverticulitis-complicated by possible perforation and small abscess. Patient does not meet sepsis criteria - Empiric IV Zosyn  - follow cultures(preliminary unremarkable) - General Surgery consulted -appreciate insight recommendations - Repeat CT shows ongoing focal wall thickening but general inflammation and prior edematous mid sigmoid colon area - Advance diet slowly - Continues to require IV narcotics for pain control, hopefully will be able to wean these off for possible disposition in the next 24 hours  Dysuria, resolved  - Transient episode of mid abdominal pain with urination overnight on 2nd, no recurrent episodes or issues - Denies hematuria or cloudy urine - UA at intake unremarkable  Hypokalemia-Resolved  Hypertension-currently stable, will monitor   Type 2 diabetes -diet controlled - Updated A1c - 6.3 - Sliding scale insulin ; hypoglycemic protocol ongoing  DVT prophylaxis: SCDs Start: 08/25/24 0738 Code Status:   Code Status: Full Code Family Communication: None present  Status is: Inpatient  Dispo: The patient is from: Home              Anticipated d/c is to: Home               Anticipated d/c date is: 24 hours              Patient currently not medically stable for discharge  Consultants:  General Surgery  Procedures:  None  Antimicrobials:  Zosyn   Subjective: No acute issue/events overnight - pain ongoing with no only minimal improvement, continues to require IV narcotics for pain control today  Objective: Vitals:   08/29/24 0822 08/29/24 1832 08/29/24 2032 08/30/24 0447  BP: (!) 127/91 129/86 138/82 117/74  Pulse: (!) 59 65 62 62  Resp: 14 16 18 17   Temp: 97.8 F (36.6 C) 98.6 F (37 C) 98.5 F (36.9 C) 98.2 F (36.8 C)  TempSrc: Oral Oral Oral Oral  SpO2: 100% 99% 91% 98%    Intake/Output Summary (Last 24 hours) at 08/30/2024 0727 Last data filed at 08/30/2024 0606 Gross per 24 hour  Intake 1228.68 ml  Output 1450 ml  Net -221.32 ml   There were no vitals filed for this visit.  Examination:  General:  Pleasantly resting in bed, No acute distress. HEENT:  Normocephalic atraumatic.  Sclerae nonicteric, noninjected.  Extraocular movements intact bilaterally. Neck:  Without mass or deformity.  Trachea is midline. Lungs:  Clear to auscultate bilaterally without rhonchi, wheeze, or rales. Heart:  Regular rate and rhythm.  Without murmurs, rubs, or gallops. Abdomen:  Soft, minimally tender to deep palpation left lower quadrant without guarding or rebound. Extremities: Without cyanosis, clubbing, edema, or obvious deformity. Skin:  Warm and dry, no erythema.  Data Reviewed: I have personally reviewed following labs and imaging studies  CBC: Recent Labs  Lab 08/26/24  9563 08/27/24 0430 08/28/24 0431 08/29/24 0501 08/30/24 0544  WBC 7.8 6.2 5.5 6.7 7.6  HGB 12.7* 12.8* 13.0 12.8* 13.7  HCT 40.7 42.0 41.4 42.2 44.0  MCV 87.5 87.3 85.7 86.1 86.3  PLT 284 327 361 360 358   Basic Metabolic Panel: Recent Labs  Lab 08/26/24 0436 08/27/24 0430 08/28/24 0431 08/29/24 0501 08/30/24 0544  NA 137 137 140 139 139  K 3.8 3.8  4.2 4.1 4.2  CL 104 103 106 107 106  CO2 21* 23 24 23 22   GLUCOSE 83 91 149* 165* 111*  BUN 10 8 9 10 13   CREATININE 0.99 0.98 1.02 1.13 1.06  CALCIUM  8.6* 8.7* 8.8* 8.9 9.2   GFR: CrCl cannot be calculated (Unknown ideal weight.). Liver Function Tests: Recent Labs  Lab 08/25/24 0323  AST 12*  ALT 10  ALKPHOS 143*  BILITOT 0.7  PROT 7.1  ALBUMIN 3.8   HbA1C: No results for input(s): HGBA1C in the last 72 hours.  CBG: Recent Labs  Lab 08/29/24 1200 08/29/24 1641 08/29/24 2024 08/29/24 2314 08/30/24 0439  GLUCAP 113* 118* 125* 119* 142*   Recent Results (from the past 240 hours)  Blood culture (routine x 2)     Status: None (Preliminary result)   Collection Time: 08/25/24  6:20 AM   Specimen: BLOOD  Result Value Ref Range Status   Specimen Description   Final    BLOOD LEFT ANTECUBITAL Performed at [facility], [address], [place], [place] [address]    Special Requests   Final    BOTTLES DRAWN AEROBIC AND ANAEROBIC Blood Culture adequate volume Performed at [facility], [address], [place], [place] [address]    Culture   Final    NO GROWTH 4 DAYS Performed at [facility], [address], [place], [place] [address]    Report Status PENDING  Incomplete  Blood culture (routine x 2)     Status: None (Preliminary result)   Collection Time: 08/25/24  9:15 AM   Specimen: BLOOD  Result Value Ref Range Status   Specimen Description   Final    BLOOD BLOOD RIGHT ARM Performed at [facility], [address], [place], [place] [address]    Special Requests   Final    BOTTLES DRAWN AEROBIC AND ANAEROBIC Blood Culture results may not be optimal due to an inadequate volume of blood received in culture bottles Performed at [facility], [address] [place], [place] [address]    Culture   Final    NO GROWTH 4 DAYS Performed at [facility], [address],  [place], [place] [address]    Report Status PENDING  Incomplete     Radiology Studies: CT ABDOMEN PELVIS W CONTRAST Result Date: 08/29/2024 CLINICAL DATA:  Diverticulitis.  Follow-up abscess. EXAM: CT ABDOMEN AND PELVIS WITH CONTRAST TECHNIQUE: Multidetector CT imaging of the abdomen and pelvis was performed using the standard protocol following bolus administration of intravenous contrast. RADIATION DOSE REDUCTION: This exam was performed according to the departmental dose-optimization program which includes automated exposure control, adjustment of the mA and/or kV according to patient size and/or use of iterative reconstruction technique. CONTRAST:  OMNIPAQUE  IOHEXOL  300 MG/ML  SOLN COMPARISON:  08/25/2024 FINDINGS: Lower chest: No acute findings. Hepatobiliary: No suspicious focal abnormality within the liver parenchyma. There is no evidence for gallstones, gallbladder wall thickening, or pericholecystic fluid. No intrahepatic or extrahepatic biliary dilation. Pancreas: No  focal mass lesion. No dilatation of the main duct. No intraparenchymal cyst. No peripancreatic edema. Spleen: No splenomegaly. No suspicious focal mass lesion. Adrenals/Urinary Tract: Stable appearance bilateral adrenal nodules. 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 Tiny well-defined homogeneous low-density lesions in the right kidney are too small to characterize but statistically most likely benign and probably cysts. No followup imaging is recommended. 2.9 cm cystic lesion medial upper pole right kidney with enhancing central architecture is similar to prior. No evidence for hydroureter. Mild bladder wall thickening seen towards the dome. Stomach/Bowel: Stomach is distended with food. Duodenum is normally positioned as is the ligament of Treitz. No small bowel wall thickening. No small bowel dilatation. The terminal ileum is normal. The appendix is  normal. Right and transverse colon unremarkable. Focal wall thickening again seen mid sigmoid colon, mildly improved in the interval. No definite abscess at this time. Small ill-defined/unorganized fluid collection is identified anterior to the sigmoid colon and just cranial to the dome of the bladder, decreased from previously. Tiny extraluminal gas bubble noted in this location today. Vascular/Lymphatic: No abdominal aortic aneurysm. No abdominal lymphadenopathy. No pelvic lymphadenopathy. Reproductive: The prostate gland and seminal vesicles are unremarkable. Other: No substantial intraperitoneal free fluid Musculoskeletal: No worrisome lytic or sclerotic osseous abnormality. IMPRESSION: 1. Focal wall thickening again seen in the mid sigmoid colon with pericolic edema/inflammation, mildly improved in the interval. No definite abscess at this time. Small ill-defined/unorganized fluid collection seen previously just cranial to the dome of the bladder has decreased in the interval. There is a tiny extraluminal gas bubble in this region today. 2. Mild bladder wall thickening towards the dome, likely secondary to the adjacent inflammatory process. 3. Stable appearance bilateral adrenal nodules. 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 4. 2.9 cm cystic lesion medial upper pole right kidney with enhancing central architecture is similar to prior. MRI of the abdomen with and without contrast recommended to further evaluate. Electronically Signed   By: Camellia Candle M.D.   On: 08/29/2024 12:42         Scheduled Meds:  insulin  aspart  0-15 Units Subcutaneous Q4H   Continuous Infusions:  piperacillin -tazobactam (ZOSYN )  IV 3.375 g (08/30/24 0512)     LOS: 5 days    Time spent:    Logan JAYSON Montclair, DO [facility]  If 7PM-7AM, please contact night-coverage [email or web address]  08/30/2024, 7:27 AM

## 2024-08-30 NOTE — Progress Notes (Signed)
 Patient ID: Logan Lucas, male   DOB: [birth year], 48 y.o.   MRN: [id number]   Acute Care Surgery Service Progress Note:    Chief Complaint/Subjective: Overall feels better than when he came into the hospital but still having lower abdominal pain at times.  Denies that eating and drinking is exacerbating the pain.  Had a bowel movement this morning.  No nausea or vomiting.  Objective: Vital signs in last 24 hours: Temp:  [98.2 F (36.8 C)-98.6 F (37 C)] 98.2 F (36.8 C) (10/05 0447) Pulse Rate:  [62-65] 62 (10/05 0447) Resp:  [16-18] 17 (10/05 0447) BP: (117-138)/(74-86) 117/74 (10/05 0447) SpO2:  [91 %-99 %] 98 % (10/05 0447) Last BM Date : 08/28/24  Intake/Output from previous day: 10/04 0701 - 10/05 0700 In: 1228.7 [P.O.:1080; IV Piggyback:148.7] Out: 1450 [Urine:1450] Intake/Output this shift: No intake/output data recorded.  Lungs: , nonlabored  Cardiovascular: reg  Abd: Soft, obese, some tenderness to palpation in suprapubic and left lower quadrant but no rebound or peritonitis  Extremities: no edema, +SCDs  Neuro: alert, nonfocal  Lab Results: CBC  Recent Labs    08/29/24 0501 08/30/24 0544  WBC 6.7 7.6  HGB 12.8* 13.7  HCT 42.2 44.0  PLT 360 358   BMET Recent Labs    08/29/24 0501 08/30/24 0544  NA 139 139  K 4.1 4.2  CL 107 106  CO2 23 22  GLUCOSE 165* 111*  BUN 10 13  CREATININE 1.13 1.06  CALCIUM  8.9 9.2   LFT    Latest Ref Rng & Units 08/25/2024    3:23 AM 08/13/2024    8:00 PM 07/17/2024   12:27 AM  Hepatic Function  Total Protein 6.5 - 8.1 g/dL 7.1  6.3  6.5   Albumin 3.5 - 5.0 g/dL 3.8  3.5  3.5   AST 15 - 41 U/L 12  14  18    ALT 0 - 44 U/L 10  14  19    Alk Phosphatase 38 - 126 U/L 143  88  82   Total Bilirubin 0.0 - 1.2 mg/dL 0.7  0.8  0.4    PT/INR No results for input(s): LABPROT, INR in the last 72 hours. ABG No results for input(s): PHART, HCO3 in the last 72 hours.  Invalid input(s): PCO2,  PO2  Studies/Results:  Anti-infectives: Anti-infectives (From admission, onward)    Start     Dose/Rate Route Frequency Ordered Stop   08/25/24 1200  piperacillin -tazobactam (ZOSYN ) IVPB 3.375 g        3.375 g 12.5 mL/hr over 240 Minutes Intravenous Every 8 hours 08/25/24 0744     08/25/24 0630  vancomycin (VANCOCIN) IVPB 1000 mg/200 mL premix  Status:  Discontinued        1,000 mg 200 mL/hr over 60 Minutes Intravenous  Once 08/25/24 0621 08/25/24 0623   08/25/24 0630  piperacillin -tazobactam (ZOSYN ) IVPB 3.375 g        3.375 g 100 mL/hr over 30 Minutes Intravenous  Once 08/25/24 0621 08/25/24 0722   08/25/24 0630  vancomycin (VANCOREADY) IVPB 2000 mg/400 mL        2,000 mg 200 mL/hr over 120 Minutes Intravenous  Once 08/25/24 [id number] 08/26/24 0731       Medications: Scheduled Meds:  insulin  aspart  0-15 Units Subcutaneous Q4H   Continuous Infusions:  piperacillin -tazobactam (ZOSYN )  IV 3.375 g (08/30/24 0512)   PRN Meds:.acetaminophen  **OR** acetaminophen , albuterol , HYDROmorphone  (DILAUDID ) injection, ondansetron  **OR** ondansetron  (ZOFRAN ) IV, oxyCODONE , traZODone  Assessment/Plan: Patient Active  Problem List   Diagnosis Date Noted   Diverticulitis of both large and small intestine with perforation with bleeding 08/25/2024   Acute diverticulitis 07/31/2022   Hypertriglyceridemia 06/05/2022   Anxiety 06/05/2022   Severe hyperglycemia due to diabetes mellitus (HCC) 05/24/2022   Cocaine abuse (HCC) 01/25/2021   Uncontrolled type 2 diabetes mellitus with hyperglycemia (HCC) 01/25/2021   Abnormal stress test    Alcohol use 01/23/2021   Rectal bleeding 01/23/2021   Asthma    Hypertension    Tobacco abuse    Duodenitis 10/14/2019   Diverticulitis with small evolving diverticular abscess - CT from 08/25/2024 marked focal wall thickening in the mid sigmoid colon with substantial pericolonic edema/inflammation. Ill-defined 2.2 x 2.8 cm fluid collection adjacent to the colon  contains a tiny gas locule. Imaging features are compatible with acute diverticulitis with evolving small diverticular abscess. The tiny gas locule could be due to infection or contained micro perforation. - Vitals stable. Afebrile. - WBC normalized from 15.2 - Reported ongoing significant pain on 10/4 so had repeat CT - Focal wall thickening again seen in the mid sigmoid colon with pericolic edema/inflammation, mildly improved in the interval. No definite abscess at this time. Small ill-defined/unorganized fluid collection seen previously just cranial to the dome of the bladder has decreased in the interval. There is a tiny extraluminal gas bubble in this region today. - no indication for emergent surgical intervention at this time -For now I would continue antibiotic management.  I still would like to try to avoid surgery during this admission since overall he is doing better with just some continued discomfort; hopefully this will not turn into smoldering diverticulitis - Will need outpatient GI referral for colonoscopy     FEN: FLD; IVF per primary team VTE: SCDs, ok to have SQH or LMWH from surgery standpoint  ID: Zosyn  9/30>>   - per TRH -  2.9 cm cystic lesion medial upper pole right kidney - will need outpt MRI T2DM HTN Hx of GIB Asthma       LOS: 2 days    I reviewed hospitalist notes, last 24 h vitals and pain scores, last 48 h intake and output, last 24 h labs and trends, and last 24 h imaging results.   This care required moderate level of medical decision making.  Disposition: as above  LOS: 5 days    Camellia HERO. Tanda, MD, FACS General, Bariatric, & Minimally Invasive Surgery [phone] [facility], [facility]

## 2024-08-31 ENCOUNTER — Other Ambulatory Visit (HOSPITAL_COMMUNITY): Payer: Self-pay

## 2024-08-31 LAB — CBC
HCT: 41.9 % (ref 39.0–52.0)
Hemoglobin: 13.3 g/dL (ref 13.0–17.0)
MCH: 26.9 pg (ref 26.0–34.0)
MCHC: 31.7 g/dL (ref 30.0–36.0)
MCV: 84.6 fL (ref 80.0–100.0)
Platelets: 356 K/uL (ref 150–400)
RBC: 4.95 MIL/uL (ref 4.22–5.81)
RDW: 13.3 % (ref 11.5–15.5)
WBC: 9.7 K/uL (ref 4.0–10.5)
nRBC: 0 % (ref 0.0–0.2)

## 2024-08-31 LAB — BASIC METABOLIC PANEL WITH GFR
Anion gap: 8 (ref 5–15)
BUN: 14 mg/dL (ref 6–20)
CO2: 23 mmol/L (ref 22–32)
Calcium: 8.8 mg/dL — ABNORMAL LOW (ref 8.9–10.3)
Chloride: 107 mmol/L (ref 98–111)
Creatinine, Ser: 1.24 mg/dL (ref 0.61–1.24)
GFR, Estimated: >60 mL/min (ref >60)
Glucose, Bld: 154 mg/dL — ABNORMAL HIGH (ref 70–99)
Potassium: 4.1 mmol/L (ref 3.5–5.1)
Sodium: 137 mmol/L (ref 135–145)

## 2024-08-31 LAB — GLUCOSE, CAPILLARY
Glucose-Capillary: 103 mg/dL — ABNORMAL HIGH (ref 70–99)
Glucose-Capillary: 112 mg/dL — ABNORMAL HIGH (ref 70–99)
Glucose-Capillary: 136 mg/dL — ABNORMAL HIGH (ref 70–99)
Glucose-Capillary: 192 mg/dL — ABNORMAL HIGH (ref 70–99)

## 2024-08-31 MED ORDER — OXYCODONE HCL 5 MG PO TABS
5.0000 mg | ORAL_TABLET | ORAL | 0 refills | Status: AC | PRN
  Filled 2024-08-31: qty 18, 3d supply, fill #0

## 2024-08-31 MED ORDER — AMOXICILLIN-POT CLAVULANATE 875-125 MG PO TABS
1.0000 | ORAL_TABLET | Freq: Two times a day (BID) | ORAL | 0 refills | Status: AC
  Filled 2024-08-31: qty 8, 4d supply, fill #0

## 2024-08-31 MED ORDER — ONDANSETRON HCL 4 MG PO TABS
4.0000 mg | ORAL_TABLET | Freq: Four times a day (QID) | ORAL | 0 refills | Status: DC | PRN
  Filled 2024-08-31: qty 20, 5d supply, fill #0

## 2024-08-31 MED ORDER — BLOOD GLUCOSE MONITOR SYSTEM W/DEVICE KIT
1.0000 | PACK | Freq: Three times a day (TID) | 0 refills | Status: AC
  Filled 2024-08-31: qty 1, 30d supply, fill #0

## 2024-08-31 MED ORDER — ACCU-CHEK SOFTCLIX LANCETS MISC
1.0000 | Freq: Three times a day (TID) | 0 refills | Status: AC
  Filled 2024-08-31: qty 100, 30d supply, fill #0

## 2024-08-31 MED ORDER — BLOOD GLUCOSE TEST VI STRP
1.0000 | ORAL_STRIP | Freq: Three times a day (TID) | 0 refills | Status: AC
  Filled 2024-08-31: qty 100, 34d supply, fill #0

## 2024-08-31 MED ORDER — LANCET DEVICE MISC
1.0000 | Freq: Three times a day (TID) | 0 refills | Status: AC
  Filled 2024-08-31: qty 1, 30d supply, fill #0

## 2024-08-31 NOTE — Discharge Summary (Signed)
 Physician Discharge Summary  Logan Lucas FMW:[id number] DOB: 1976-10-10 DOA: 08/25/2024  PCP: Patient, No Pcp Per  Admit date: 08/25/2024 Discharge date: 08/31/2024  Admitted From: Home Disposition: Home  Recommendations for Outpatient Follow-up:  Please establish new PCP office, recommend follow-up in 1 to 2 weeks  Home Health: None Equipment/Devices: None  Discharge Condition: Stable CODE STATUS: Full Diet recommendation: Low-salt low-fat low-carb diet  Brief/Interim Summary: Logan Lucas is a 48 y.o. male with medical history significant for asthma, hypertension, diet-controlled diabetes diagnosed with acute diverticulitis on 9/19 now returning to the hospital with worsening abdominal pain and questionable abscess on imaging.  Patient admitted as above with worsening acute diverticulitis with possible perforation versus small abscess.  Patient's imaging is improving as is his clinical picture.  General surgery initially following however concern for abscess possible need for procedure but given patient's marked improvement on antibiotics and supportive care he is otherwise improved now tolerating p.o. with multiple bowel movements without any signs of bleeding and abdominal pain has now essentially resolved.  Recommend close follow-up with PCP, unfortunately patient does not have established care and we have recommended to follow-up with a new office in the next 1 to 2 weeks if at all possible.  Patient will likely need to be evaluated in the future with either GI or general surgery should his diverticulosis continue to be problematic/frequently recurrent.  Patient otherwise stable and agreeable for discharge home.    Discharge Diagnoses:  Principal Problem:   Acute diverticulitis Active Problems:   Diverticulitis of both large and small intestine with perforation with bleeding  Acute diverticulitis-complicated by possible perforation and small abscess. Patient does not  meet sepsis criteria - Transition to Augmentin  to complete 10-day course - General Surgery consulted -appreciate insight recommendations -no indication for procedure at this time - Repeat CT shows ongoing focal wall thickening but general improvement in inflammation and prior edematous mid sigmoid colon area - Advance diet slowly -tolerating p.o. now without difficulty - Continue as needed Zofran  and oxycodone  at discharge as below   Dysuria, single event, resolved  - Transient episode of mid abdominal pain with urination overnight on 2nd, no recurrent episodes or issues - Denies hematuria or cloudy urine - UA at intake unremarkable   Hypokalemia-Resolved  Hypertension-currently stable, will monitor   Type 2 diabetes -diet controlled - Updated A1c - 6.3 - New glucose kit prescribed -congratulated patient on controlling his current diabetes on diet alone, discussed that continued monitoring would be important  Discharge Instructions  Discharge Instructions     Call MD for:  difficulty breathing, headache or visual disturbances   Complete by: As directed    Call MD for:  extreme fatigue   Complete by: As directed    Call MD for:  hives   Complete by: As directed    Call MD for:  persistant dizziness or light-headedness   Complete by: As directed    Call MD for:  persistant nausea and vomiting   Complete by: As directed    Call MD for:  severe uncontrolled pain   Complete by: As directed    Call MD for:  temperature >100.4   Complete by: As directed    Diet - low sodium heart healthy   Complete by: As directed    Increase activity slowly   Complete by: As directed       Allergies as of 08/31/2024       Reactions   Oxycodone  Itching  Medication List     STOP taking these medications    blood glucose meter kit and supplies Kit   ciprofloxacin  500 MG tablet Commonly known as: CIPRO    gabapentin  300 MG capsule Commonly known as: NEURONTIN    glucose blood  test strip Replaced by: BLOOD GLUCOSE TEST STRIPS Strp   metroNIDAZOLE  500 MG tablet Commonly known as: FLAGYL    neomycin -polymyxin-hydrocortisone 3.5-10000-1 OTIC suspension Commonly known as: CORTISPORIN   ondansetron  4 MG disintegrating tablet Commonly known as: ZOFRAN -ODT       TAKE these medications    amoxicillin -clavulanate 875-125 MG tablet Commonly known as: AUGMENTIN  Take 1 tablet by mouth 2 (two) times daily for 4 days.   Blood Glucose Monitoring Suppl Devi 1 each by Does not apply route in the morning, at noon, and at bedtime. May substitute to any manufacturer covered by patient's insurance.   BLOOD GLUCOSE TEST STRIPS Strp 1 each by In Vitro route in the morning, at noon, and at bedtime. May substitute to any manufacturer covered by patient's insurance. Replaces: glucose blood test strip   Lancet Device Misc 1 each by Does not apply route in the morning, at noon, and at bedtime. May substitute to any manufacturer covered by patient's insurance.   Lancets Misc. Misc 1 each by Does not apply route in the morning, at noon, and at bedtime. May substitute to any manufacturer covered by patient's insurance.   ondansetron  4 MG tablet Commonly known as: ZOFRAN  Take 1 tablet (4 mg total) by mouth every 6 (six) hours as needed for nausea.   oxyCODONE  5 MG immediate release tablet Commonly known as: Oxy IR/ROXICODONE  Take 1 tablet (5 mg total) by mouth every 4 (four) hours as needed for up to 3 days for moderate pain (pain score 4-6).        Allergies  Allergen Reactions   Oxycodone  Itching    Consultations: General Surgery  Procedures/Studies: CT ABDOMEN PELVIS W CONTRAST Result Date: 08/29/2024 CLINICAL DATA:  Diverticulitis.  Follow-up abscess. EXAM: CT ABDOMEN AND PELVIS WITH CONTRAST TECHNIQUE: Multidetector CT imaging of the abdomen and pelvis was performed using the standard protocol following bolus administration of intravenous contrast. RADIATION  DOSE REDUCTION: This exam was performed according to the departmental dose-optimization program which includes automated exposure control, adjustment of the mA and/or kV according to patient size and/or use of iterative reconstruction technique. CONTRAST:  OMNIPAQUE  IOHEXOL  300 MG/ML  SOLN COMPARISON:  08/25/2024 FINDINGS: Lower chest: No acute findings. Hepatobiliary: No suspicious focal abnormality within the liver parenchyma. There is no evidence for gallstones, gallbladder wall thickening, or pericholecystic fluid. No intrahepatic or extrahepatic biliary dilation. Pancreas: No focal mass lesion. No dilatation of the main duct. No intraparenchymal cyst. No peripancreatic edema. Spleen: No splenomegaly. No suspicious focal mass lesion. Adrenals/Urinary Tract: Stable appearance bilateral adrenal nodules. 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 Tiny well-defined homogeneous low-density lesions in the right kidney are too small to characterize but statistically most likely benign and probably cysts. No followup imaging is recommended. 2.9 cm cystic lesion medial upper pole right kidney with enhancing central architecture is similar to prior. No evidence for hydroureter. Mild bladder wall thickening seen towards the dome. Stomach/Bowel: Stomach is distended with food. Duodenum is normally positioned as is the ligament of Treitz. No small bowel wall thickening. No small bowel dilatation. The terminal ileum is normal. The appendix is normal. Right and transverse colon unremarkable. Focal wall thickening again  seen mid sigmoid colon, mildly improved in the interval. No definite abscess at this time. Small ill-defined/unorganized fluid collection is identified anterior to the sigmoid colon and just cranial to the dome of the bladder, decreased from previously. Tiny extraluminal gas bubble noted in this location today.  Vascular/Lymphatic: No abdominal aortic aneurysm. No abdominal lymphadenopathy. No pelvic lymphadenopathy. Reproductive: The prostate gland and seminal vesicles are unremarkable. Other: No substantial intraperitoneal free fluid Musculoskeletal: No worrisome lytic or sclerotic osseous abnormality. IMPRESSION: 1. Focal wall thickening again seen in the mid sigmoid colon with pericolic edema/inflammation, mildly improved in the interval. No definite abscess at this time. Small ill-defined/unorganized fluid collection seen previously just cranial to the dome of the bladder has decreased in the interval. There is a tiny extraluminal gas bubble in this region today. 2. Mild bladder wall thickening towards the dome, likely secondary to the adjacent inflammatory process. 3. Stable appearance bilateral adrenal nodules. 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 4. 2.9 cm cystic lesion medial upper pole right kidney with enhancing central architecture is similar to prior. MRI of the abdomen with and without contrast recommended to further evaluate. Electronically Signed   By: Camellia Candle M.D.   On: 08/29/2024 12:42   CT ABDOMEN PELVIS W CONTRAST Result Date: 08/25/2024 CLINICAL DATA:  3 day history of abdominal pain. EXAM: CT ABDOMEN AND PELVIS WITH CONTRAST TECHNIQUE: Multidetector CT imaging of the abdomen and pelvis was performed using the standard protocol following bolus administration of intravenous contrast. RADIATION DOSE REDUCTION: This exam was performed according to the departmental dose-optimization program which includes automated exposure control, adjustment of the mA and/or kV according to patient size and/or use of iterative reconstruction technique. CONTRAST:  OMNIPAQUE  IOHEXOL  300 MG/ML  SOLN COMPARISON:  08/13/2024 FINDINGS: Lower chest: Unremarkable. Hepatobiliary: No suspicious focal abnormality within the liver  parenchyma. There is no evidence for gallstones, gallbladder wall thickening, or pericholecystic fluid. No intrahepatic or extrahepatic biliary dilation. Pancreas: No focal mass lesion. No dilatation of the main duct. No intraparenchymal cyst. No peripancreatic edema. Spleen: No splenomegaly. No suspicious focal mass lesion. 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 No followup imaging is recommended. Right kidney unremarkable. 2.9 cm cystic lesion in the upper pole left kidney is progressive since the 2021 exam and has evidence of an internal septation on today's study. 2nd smaller cyst is identified in the anterior interpolar left kidney. No evidence for hydroureter. Bladder is decompressed with circumferential bladder wall thickening. Stomach/Bowel: Stomach is unremarkable. No gastric wall thickening. No evidence of outlet obstruction. Duodenum is normally positioned as is the ligament of Treitz. No small bowel wall thickening. No small bowel dilatation. The terminal ileum is normal. The appendix is normal. A few diverticula are seen in the left colon. There is marked focal wall thickening in the mid sigmoid colon with substantial pericolonic edema/inflammation. Ill-defined 2.2 x 2.8 cm fluid collection adjacent to the colon (image 62/2) contains a tiny gas locule. These inflammatory changes are contiguous with the dome of the bladder. Vascular/Lymphatic: No abdominal aortic aneurysm. No abdominal aortic atherosclerotic calcification. There is no gastrohepatic or hepatoduodenal ligament lymphadenopathy. No retroperitoneal or mesenteric lymphadenopathy. No pelvic sidewall lymphadenopathy. Reproductive: The prostate gland and seminal vesicles are unremarkable. Other: Trace free fluid noted low left paracolic gutter with edema in the sigmoid mesocolon. Musculoskeletal:  No worrisome lytic or sclerotic osseous abnormality. IMPRESSION: 1. Marked focal wall thickening in the mid sigmoid colon with substantial pericolonic edema/inflammation. Ill-defined 2.2 x 2.8 cm fluid collection adjacent to the colon contains a tiny gas locule. Imaging features are compatible with acute diverticulitis with evolving small diverticular abscess. The tiny gas locule could be due to infection or contained micro perforation. 2. Circumferential bladder wall thickening. This finding may be secondary to irritation from the adjacent inflammatory process but bladder infection is not excluded. 3. 2.9 cm cystic lesion in the upper pole left kidney is progressive since the 2021 exam and has evidence of an internal septation on today's study. 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 This MRI should be deferred to a nonemergent outpatient basis after patient has recovered from this acute process to best allow participation in positioning and reproducible breath holding. Electronically Signed   By: Camellia Candle M.D.   On: 08/25/2024 06:07   CT ABDOMEN PELVIS W CONTRAST Result Date: 08/13/2024 CLINICAL DATA:  Abdominal pain, acute, nonlocalized Pt coming in today with lower abdominal pain pt reporting nausea. Pt reporting increased pain with urination increased pain with passing gas and increased pain with having a BM. EXAM: CT ABDOMEN AND PELVIS WITH CONTRAST TECHNIQUE: Multidetector CT imaging of the abdomen and pelvis was performed using the standard protocol following bolus administration of intravenous contrast. RADIATION DOSE REDUCTION: This exam was performed according to the departmental dose-optimization program which includes automated exposure control, adjustment of the mA and/or kV according to patient size and/or use of iterative reconstruction technique. CONTRAST:  75mL OMNIPAQUE  IOHEXOL  350 MG/ML SOLN COMPARISON:  CT  abdomen pelvis 02/28/2023 FINDINGS: Lower chest: No acute abnormality. Hepatobiliary: No focal liver abnormality. No gallstones, gallbladder wall thickening, or pericholecystic fluid. No biliary dilatation. Pancreas: No focal lesion. Normal pancreatic contour. No surrounding inflammatory changes. No main pancreatic ductal dilatation. Spleen: Normal in size without focal abnormality. Adrenals/Urinary Tract: Bilateral fluid density nodules along the adrenal glands are stable from prior and consistent with adrenal adenomas-no further follow-up indicated. Bilateral kidneys enhance symmetrically. Fluid dense lesion Macario of the left kidney likely represents a simple renal cyst. Simple renal cysts, in the absence of clinically indicated signs/symptoms, require no independent follow-up. No hydronephrosis. No hydroureter.  No nephroureterolithiasis. The urinary bladder is unremarkable. Stomach/Bowel: Stomach is within normal limits. No evidence of small bowel wall thickening or dilatation. Mid sigmoid bowel wall thickening with a focal inflammatory changes and pericolonic fat stranding along a diverticula. Inflammatory changes extend inferiorly to the urinary bladder dome with retained intraperitoneal fat between the sigmoid and the urinary bladder dome. Appendix appears normal. Vascular/Lymphatic: No abdominal aorta or iliac aneurysm. Mild atherosclerotic plaque of the aorta and its branches. No abdominal, pelvic, or inguinal lymphadenopathy. Reproductive: Prostate is unremarkable. Other: No intraperitoneal free fluid. No intraperitoneal free gas. No organized fluid collection. Musculoskeletal: No abdominal wall hernia or abnormality. No suspicious lytic or blastic osseous lesions. No acute displaced fracture. IMPRESSION: Colonic diverticulosis with mid sigmoid acute diverticulitis. Recommend colonoscopy status post treatment and status post complete resolution of inflammatory changes to exclude an underlying lesion.  Electronically Signed   By: Morgane  Naveau M.D.   On: 08/13/2024 22:44     Subjective: No acute issues or events overnight denies nausea vomiting diarrhea constipation headache fevers chills or chest pain   Discharge Exam: Vitals:   08/30/24 1950 08/31/24 0516  BP: 138/81 122/65  Pulse: 65 (!) 56  Resp: 17 19  Temp: 98.2 F (36.8 C) 97.7 F (36.5 C)  SpO2: 100% 99%   Vitals:   08/30/24 0447 08/30/24 1352 08/30/24 1950 08/31/24 0516  BP: 117/74 128/75 138/81 122/65  Pulse: 62 64 65 (!) 56  Resp: 17 16 17 19   Temp: 98.2 F (36.8 C) 98 F (36.7 C) 98.2 F (36.8 C) 97.7 F (36.5 C)  TempSrc: Oral  Oral   SpO2: 98% 98% 100% 99%    General: Pt is alert, awake, not in acute distress Cardiovascular: RRR, S1/S2 +, no rubs, no gallops Respiratory: CTA bilaterally, no wheezing, no rhonchi Abdominal: Soft, NT, ND, bowel sounds + Extremities: no edema, no cyanosis    The results of significant diagnostics from this hospitalization (including imaging, microbiology, ancillary and laboratory) are listed below for reference.     Microbiology: Recent Results (from the past 240 hours)  Blood culture (routine x 2)     Status: None   Collection Time: 08/25/24  6:20 AM   Specimen: BLOOD  Result Value Ref Range Status   Specimen Description   Final    BLOOD LEFT ANTECUBITAL Performed at [facility], [address], [place], [place] [address]    Special Requests   Final    BOTTLES DRAWN AEROBIC AND ANAEROBIC Blood Culture adequate volume Performed at [facility], [address], [place], [place] [address]    Culture   Final    NO GROWTH 5 DAYS Performed at [facility], [address] [place], [place] [address]    Report Status 08/30/2024 FINAL  Final  Blood culture (routine x 2)     Status: None   Collection Time: 08/25/24  9:15 AM   Specimen: BLOOD  Result Value Ref Range Status   Specimen Description   Final    BLOOD BLOOD  RIGHT ARM Performed at [facility], [address], [place], [place] [address]    Special Requests   Final    BOTTLES DRAWN AEROBIC AND ANAEROBIC Blood Culture results may not be optimal due to an inadequate volume of blood received in culture bottles Performed at [facility], [address] [place], [place] [address]    Culture   Final    NO GROWTH 5 DAYS Performed at [facility], [address] [place], [place] [address]    Report Status 08/30/2024 FINAL  Final     Labs: BNP (last 3 results) No results for input(s): BNP in the last 8760 hours. Basic Metabolic Panel: Recent Labs  Lab 08/27/24 0430 08/28/24 0431 08/29/24 0501 08/30/24 0544 08/31/24 0431  NA 137 140 139 139 137  K 3.8 4.2 4.1 4.2 4.1  CL 103 106 107 106 107  CO2 23 24 23 22 23   GLUCOSE 91 149* 165* 111* 154*  BUN 8 9 10 13 14   CREATININE 0.98 1.02 1.13 1.06 1.24  CALCIUM  8.7* 8.8* 8.9 9.2 8.8*   Liver Function Tests: Recent Labs  Lab 08/25/24 0323  AST 12*  ALT 10  ALKPHOS 143*  BILITOT 0.7  PROT 7.1  ALBUMIN 3.8   No results for input(s): LIPASE, AMYLASE in the last 168 hours. No results for input(s): AMMONIA in the last 168 hours. CBC: Recent Labs  Lab 08/27/24 0430 08/28/24 0431 08/29/24 0501 08/30/24 0544 08/31/24 0431  WBC 6.2 5.5 6.7 7.6 9.7  HGB 12.8* 13.0 12.8* 13.7 13.3  HCT 42.0 41.4  42.2 44.0 41.9  MCV 87.3 85.7 86.1 86.3 84.6  PLT 327 361 360 358 356   Cardiac Enzymes: No results for input(s): CKTOTAL, CKMB, CKMBINDEX, TROPONINI in the last 168 hours. BNP: Invalid input(s): POCBNP CBG: Recent Labs  Lab 08/30/24 1626 08/30/24 1947 08/30/24 2359 08/31/24 0513 08/31/24 0736  GLUCAP 113* 176* 112* 192* 103*   D-Dimer No results for input(s): DDIMER in the last 72 hours. Hgb A1c No results for input(s): HGBA1C in the last 72 hours. Lipid Profile No results for input(s): CHOL, HDL, LDLCALC,  TRIG, CHOLHDL, LDLDIRECT in the last 72 hours. Thyroid function studies No results for input(s): TSH, T4TOTAL, T3FREE, THYROIDAB in the last 72 hours.  Invalid input(s): FREET3 Anemia work up No results for input(s): VITAMINB12, FOLATE, FERRITIN, TIBC, IRON, RETICCTPCT in the last 72 hours. Urinalysis    Component Value Date/Time   COLORURINE AMBER (A) 08/25/2024 0549   APPEARANCEUR CLEAR 08/25/2024 0549   APPEARANCEUR Hazy 02/10/2014 0521   LABSPEC 1.028 08/25/2024 0549   LABSPEC 1.024 02/10/2014 0521   PHURINE 5.0 08/25/2024 0549   GLUCOSEU NEGATIVE 08/25/2024 0549   GLUCOSEU Negative 02/10/2014 0521   HGBUR NEGATIVE 08/25/2024 0549   BILIRUBINUR NEGATIVE 08/25/2024 0549   BILIRUBINUR Negative 02/10/2014 0521   KETONESUR 20 (A) 08/25/2024 0549   PROTEINUR 30 (A) 08/25/2024 0549   NITRITE NEGATIVE 08/25/2024 0549   LEUKOCYTESUR NEGATIVE 08/25/2024 0549   LEUKOCYTESUR Negative 02/10/2014 0521   Sepsis Labs Recent Labs  Lab 08/28/24 0431 08/29/24 0501 08/30/24 0544 08/31/24 0431  WBC 5.5 6.7 7.6 9.7   Microbiology Recent Results (from the past 240 hours)  Blood culture (routine x 2)     Status: None   Collection Time: 08/25/24  6:20 AM   Specimen: BLOOD  Result Value Ref Range Status   Specimen Description   Final    BLOOD LEFT ANTECUBITAL Performed at [facility], [address], [place], [place] [address]    Special Requests   Final    BOTTLES DRAWN AEROBIC AND ANAEROBIC Blood Culture adequate volume Performed at [facility], [address], [place], [place] [address]    Culture   Final    NO GROWTH 5 DAYS Performed at [facility], [address] [place], [place] [address]    Report Status 08/30/2024 FINAL  Final  Blood culture (routine x 2)     Status: None   Collection Time: 08/25/24  9:15 AM   Specimen: BLOOD  Result Value Ref Range Status   Specimen Description   Final    BLOOD  BLOOD RIGHT ARM Performed at [facility], [address], [place], [place] [address]    Special Requests   Final    BOTTLES DRAWN AEROBIC AND ANAEROBIC Blood Culture results may not be optimal due to an inadequate volume of blood received in culture bottles Performed at [facility], [address], [place], [place] [address]    Culture   Final    NO GROWTH 5 DAYS Performed at [facility], [address], [place], [place] [address]    Report Status 08/30/2024 FINAL  Final     Time coordinating discharge: Over 30 minutes  SIGNED:   Elsie JAYSON [place], DO [facility] 08/31/2024, 11:30 AM Pager   If 7PM-7AM, please contact night-coverage [email or web address]

## 2024-08-31 NOTE — Progress Notes (Signed)
 Progress Note     Subjective: Overall feeling better. Tolerating diet and having bowel function. Some pain still but improving, urinary symptoms significantly improved.   Objective: Vital signs in last 24 hours: Temp:  [97.7 F (36.5 C)-98.2 F (36.8 C)] 97.7 F (36.5 C) (10/06 0516) Pulse Rate:  [56-65] 56 (10/06 0516) Resp:  [16-19] 19 (10/06 0516) BP: (122-138)/(65-81) 122/65 (10/06 0516) SpO2:  [98 %-100 %] 99 % (10/06 0516) Last BM Date : 08/30/24  Intake/Output from previous day: 10/05 0701 - 10/06 0700 In: 1535.8 [P.O.:1420; IV Piggyback:115.8] Out: 1600 [Urine:1600] Intake/Output this shift: Total I/O In: 240 [P.O.:240] Out: -   PE: General: pleasant, WD, obese male who is laying in bed in NAD Heart: regular, rate, and rhythm.   Lungs: Respiratory effort nonlabored Abd: soft, minimal TTP in suprapubic abdomen with deep palpation, mild distention Psych: A&Ox3 with an appropriate affect.    Lab Results:  Recent Labs    08/30/24 0544 08/31/24 0431  WBC 7.6 9.7  HGB 13.7 13.3  HCT 44.0 41.9  PLT 358 356   BMET Recent Labs    08/30/24 0544 08/31/24 0431  NA 139 137  K 4.2 4.1  CL 106 107  CO2 22 23  GLUCOSE 111* 154*  BUN 13 14  CREATININE 1.06 1.24  CALCIUM  9.2 8.8*   PT/INR No results for input(s): LABPROT, INR in the last 72 hours. CMP     Component Value Date/Time   NA 137 08/31/2024 0431   NA 143 07/25/2014 1105   K 4.1 08/31/2024 0431   K 4.0 07/25/2014 1105   CL 107 08/31/2024 0431   CL 108 (H) 07/25/2014 1105   CO2 23 08/31/2024 0431   CO2 23 07/25/2014 1105   GLUCOSE 154 (H) 08/31/2024 0431   GLUCOSE 76 07/25/2014 1105   BUN 14 08/31/2024 0431   BUN 8 07/25/2014 1105   CREATININE 1.24 08/31/2024 0431   CREATININE 1.26 07/25/2014 1105   CALCIUM  8.8 (L) 08/31/2024 0431   CALCIUM  8.8 07/25/2014 1105   PROT 7.1 08/25/2024 0323   PROT 7.7 02/10/2014 0521   ALBUMIN 3.8 08/25/2024 0323   ALBUMIN 4.2 02/10/2014 0521   AST  12 (L) 08/25/2024 0323   AST 31 02/10/2014 0521   ALT 10 08/25/2024 0323   ALT 35 02/10/2014 0521   ALKPHOS 143 (H) 08/25/2024 0323   ALKPHOS 112 02/10/2014 0521   BILITOT 0.7 08/25/2024 0323   BILITOT 0.3 02/10/2014 0521   GFRNONAA >60 08/31/2024 0431   GFRNONAA >60 07/25/2014 1105   GFRAA >60 08/11/2020 0115   GFRAA >60 07/25/2014 1105   Lipase     Component Value Date/Time   LIPASE 48 08/13/2024 2000       Studies/Results: CT ABDOMEN PELVIS W CONTRAST Result Date: 08/29/2024 CLINICAL DATA:  Diverticulitis.  Follow-up abscess. EXAM: CT ABDOMEN AND PELVIS WITH CONTRAST TECHNIQUE: Multidetector CT imaging of the abdomen and pelvis was performed using the standard protocol following bolus administration of intravenous contrast. RADIATION DOSE REDUCTION: This exam was performed according to the departmental dose-optimization program which includes automated exposure control, adjustment of the mA and/or kV according to patient size and/or use of iterative reconstruction technique. CONTRAST:  OMNIPAQUE  IOHEXOL  300 MG/ML  SOLN COMPARISON:  08/25/2024 FINDINGS: Lower chest: No acute findings. Hepatobiliary: No suspicious focal abnormality within the liver parenchyma. There is no evidence for gallstones, gallbladder wall thickening, or pericholecystic fluid. No intrahepatic or extrahepatic biliary dilation. Pancreas: No focal mass lesion. No  dilatation of the main duct. No intraparenchymal cyst. No peripancreatic edema. Spleen: No splenomegaly. No suspicious focal mass lesion. Adrenals/Urinary Tract: Stable appearance bilateral adrenal nodules. 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 Tiny well-defined homogeneous low-density lesions in the right kidney are too small to characterize but statistically most likely benign and probably cysts. No followup imaging is recommended. 2.9 cm cystic lesion medial upper pole  right kidney with enhancing central architecture is similar to prior. No evidence for hydroureter. Mild bladder wall thickening seen towards the dome. Stomach/Bowel: Stomach is distended with food. Duodenum is normally positioned as is the ligament of Treitz. No small bowel wall thickening. No small bowel dilatation. The terminal ileum is normal. The appendix is normal. Right and transverse colon unremarkable. Focal wall thickening again seen mid sigmoid colon, mildly improved in the interval. No definite abscess at this time. Small ill-defined/unorganized fluid collection is identified anterior to the sigmoid colon and just cranial to the dome of the bladder, decreased from previously. Tiny extraluminal gas bubble noted in this location today. Vascular/Lymphatic: No abdominal aortic aneurysm. No abdominal lymphadenopathy. No pelvic lymphadenopathy. Reproductive: The prostate gland and seminal vesicles are unremarkable. Other: No substantial intraperitoneal free fluid Musculoskeletal: No worrisome lytic or sclerotic osseous abnormality. IMPRESSION: 1. Focal wall thickening again seen in the mid sigmoid colon with pericolic edema/inflammation, mildly improved in the interval. No definite abscess at this time. Small ill-defined/unorganized fluid collection seen previously just cranial to the dome of the bladder has decreased in the interval. There is a tiny extraluminal gas bubble in this region today. 2. Mild bladder wall thickening towards the dome, likely secondary to the adjacent inflammatory process. 3. Stable appearance bilateral adrenal nodules. 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 4. 2.9 cm cystic lesion medial upper pole right kidney with enhancing central architecture is similar to prior. MRI of the abdomen with and without contrast recommended to further evaluate. Electronically Signed   By: Camellia Candle M.D.   On:  08/29/2024 12:42    Anti-infectives: Anti-infectives (From admission, onward)    Start     Dose/Rate Route Frequency Ordered Stop   08/25/24 1200  piperacillin -tazobactam (ZOSYN ) IVPB 3.375 g        3.375 g 12.5 mL/hr over 240 Minutes Intravenous Every 8 hours 08/25/24 0744     08/25/24 0630  vancomycin (VANCOCIN) IVPB 1000 mg/200 mL premix  Status:  Discontinued        1,000 mg 200 mL/hr over 60 Minutes Intravenous  Once 08/25/24 0621 08/25/24 0623   08/25/24 0630  piperacillin -tazobactam (ZOSYN ) IVPB 3.375 g        3.375 g 100 mL/hr over 30 Minutes Intravenous  Once 08/25/24 0621 08/25/24 0722   08/25/24 0630  vancomycin (VANCOREADY) IVPB 2000 mg/400 mL        2,000 mg 200 mL/hr over 120 Minutes Intravenous  Once 08/25/24 9376 08/26/24 0731        Assessment/Plan Diverticulitis with small evolving diverticular abscess - CT from 08/25/2024 marked focal wall thickening in the mid sigmoid colon with substantial pericolonic edema/inflammation. Ill-defined 2.2 x 2.8 cm fluid collection adjacent to the colon contains a tiny gas locule. Imaging features are compatible with acute diverticulitis with evolving small diverticular abscess. The tiny gas locule could be due to infection or contained micro perforation. - Vitals stable. Afebrile. - WBC normalized from 15.2 -  Reported ongoing significant pain on 10/4 so had repeat CT - Focal wall thickening again seen in the mid sigmoid colon with pericolic edema/inflammation, mildly improved in the interval. No definite abscess at this time. Small ill-defined/unorganized fluid collection seen previously just cranial to the dome of the bladder has decreased in the interval. There is a tiny extraluminal gas bubble in this region today. - no indication for emergent surgical intervention at this time - agree with discharge home on PO abx and outpatient PCP/GI follow up for colonoscopy. Reasonable to refer to colorectal surgery after colonoscopy to  discuss possible elective resection in the future. Continue low fiber at discharge x 6-8 weeks      FEN: soft; IVF per primary team VTE: SCDs, ok to have SQH or LMWH from surgery standpoint  ID: Zosyn  9/30>>   - per [facility] -  2.9 cm cystic lesion medial upper pole right kidney - will need outpt MRI T2DM HTN Hx of GIB Asthma    LOS: 6 days   I reviewed hospitalist notes, last 24 h vitals and pain scores, last 48 h intake and output, last 24 h labs and trends, and last 24 h imaging results.  This care required moderate level of medical decision making.    Logan Lucas Louder, [facility] 08/31/2024, 10:48 AM Please see Amion for pager number during day hours 7:00am-4:30pm

## 2024-08-31 NOTE — Progress Notes (Signed)
 Discharge meds, glucose meter and supplies in a secure bag delivered to pt in main lobby by this RN

## 2024-08-31 NOTE — Progress Notes (Signed)
 Secure chat sent to Dr Lue that no diabetic meds were needed at discharge, only meter and supplies to check blood glucose at home. Dr Lue confirmed via secure chat this was correct. This RN will deliver supplies once they are ready

## 2024-09-01 ENCOUNTER — Other Ambulatory Visit (HOSPITAL_COMMUNITY): Payer: Self-pay

## 2024-10-10 ENCOUNTER — Emergency Department (HOSPITAL_COMMUNITY)

## 2024-10-10 ENCOUNTER — Encounter (HOSPITAL_COMMUNITY): Payer: Self-pay

## 2024-10-10 ENCOUNTER — Other Ambulatory Visit: Payer: Self-pay

## 2024-10-10 ENCOUNTER — Inpatient Hospital Stay (HOSPITAL_COMMUNITY)
Admission: EM | Admit: 2024-10-10 | Discharge: 2024-10-27 | DRG: 331 | Disposition: A | Discharge status: Skilled Nursing Facility | Attending: General Surgery | Admitting: General Surgery

## 2024-10-10 DIAGNOSIS — K5900 Constipation, unspecified: Secondary | ICD-10-CM | POA: Diagnosis present

## 2024-10-10 DIAGNOSIS — Z8719 Personal history of other diseases of the digestive system: Secondary | ICD-10-CM

## 2024-10-10 DIAGNOSIS — R079 Chest pain, unspecified: Secondary | ICD-10-CM | POA: Diagnosis present

## 2024-10-10 DIAGNOSIS — K5720 Diverticulitis of large intestine with perforation and abscess without bleeding: Principal | ICD-10-CM | POA: Diagnosis present

## 2024-10-10 DIAGNOSIS — F17210 Nicotine dependence, cigarettes, uncomplicated: Secondary | ICD-10-CM | POA: Diagnosis present

## 2024-10-10 DIAGNOSIS — K5792 Diverticulitis of intestine, part unspecified, without perforation or abscess without bleeding: Secondary | ICD-10-CM | POA: Diagnosis not present

## 2024-10-10 DIAGNOSIS — I10 Essential (primary) hypertension: Secondary | ICD-10-CM | POA: Diagnosis present

## 2024-10-10 DIAGNOSIS — F419 Anxiety disorder, unspecified: Secondary | ICD-10-CM | POA: Diagnosis present

## 2024-10-10 DIAGNOSIS — Z6838 Body mass index (BMI) 38.0-38.9, adult: Secondary | ICD-10-CM

## 2024-10-10 DIAGNOSIS — Z5331 Laparoscopic surgical procedure converted to open procedure: Secondary | ICD-10-CM

## 2024-10-10 DIAGNOSIS — E66812 Obesity, class 2: Secondary | ICD-10-CM | POA: Diagnosis present

## 2024-10-10 DIAGNOSIS — Z23 Encounter for immunization: Secondary | ICD-10-CM

## 2024-10-10 DIAGNOSIS — N289 Disorder of kidney and ureter, unspecified: Secondary | ICD-10-CM | POA: Diagnosis present

## 2024-10-10 DIAGNOSIS — Z885 Allergy status to narcotic agent status: Secondary | ICD-10-CM

## 2024-10-10 DIAGNOSIS — J45909 Unspecified asthma, uncomplicated: Secondary | ICD-10-CM | POA: Diagnosis present

## 2024-10-10 DIAGNOSIS — E119 Type 2 diabetes mellitus without complications: Secondary | ICD-10-CM | POA: Diagnosis present

## 2024-10-10 DIAGNOSIS — E278 Other specified disorders of adrenal gland: Secondary | ICD-10-CM | POA: Diagnosis present

## 2024-10-10 DIAGNOSIS — Z833 Family history of diabetes mellitus: Secondary | ICD-10-CM

## 2024-10-10 LAB — COMPREHENSIVE METABOLIC PANEL WITH GFR
ALT: 14 U/L (ref 0–44)
AST: 16 U/L (ref 15–41)
Albumin: 3.8 g/dL (ref 3.5–5.0)
Alkaline Phosphatase: 131 U/L — ABNORMAL HIGH (ref 38–126)
Anion gap: 12 (ref 5–15)
BUN: 7 mg/dL (ref 6–20)
CO2: 21 mmol/L — ABNORMAL LOW (ref 22–32)
Calcium: 9 mg/dL (ref 8.9–10.3)
Chloride: 102 mmol/L (ref 98–111)
Creatinine, Ser: 0.95 mg/dL (ref 0.61–1.24)
GFR, Estimated: >60 mL/min (ref >60)
Glucose, Bld: 158 mg/dL — ABNORMAL HIGH (ref 70–99)
Potassium: 3.9 mmol/L (ref 3.5–5.1)
Sodium: 135 mmol/L (ref 135–145)
Total Bilirubin: 0.4 mg/dL (ref 0.0–1.2)
Total Protein: 7.1 g/dL (ref 6.5–8.1)

## 2024-10-10 LAB — CBC
HCT: 44.2 % (ref 39.0–52.0)
Hemoglobin: 14.3 g/dL (ref 13.0–17.0)
MCH: 27.3 pg (ref 26.0–34.0)
MCHC: 32.4 g/dL (ref 30.0–36.0)
MCV: 84.4 fL (ref 80.0–100.0)
Platelets: 298 K/uL (ref 150–400)
RBC: 5.24 MIL/uL (ref 4.22–5.81)
RDW: 14.8 % (ref 11.5–15.5)
WBC: 15.1 K/uL — ABNORMAL HIGH (ref 4.0–10.5)
nRBC: 0 % (ref 0.0–0.2)

## 2024-10-10 LAB — LIPASE, BLOOD: Lipase: 23 U/L (ref 11–51)

## 2024-10-10 MED ORDER — HYDROMORPHONE HCL 1 MG/ML IJ SOLN
1.0000 mg | INTRAMUSCULAR | Status: DC | PRN
  Administered 2024-10-11: 1 mg via INTRAVENOUS
  Filled 2024-10-10: qty 1

## 2024-10-10 MED ORDER — HYDROMORPHONE HCL 1 MG/ML IJ SOLN
0.5000 mg | Freq: Once | INTRAMUSCULAR | Status: AC
  Administered 2024-10-10: 0.5 mg via INTRAVENOUS
  Filled 2024-10-10: qty 1

## 2024-10-10 MED ORDER — PIPERACILLIN-TAZOBACTAM 3.375 G IVPB
3.3750 g | Freq: Three times a day (TID) | INTRAVENOUS | Status: DC
  Administered 2024-10-11 – 2024-10-14 (×10): 3.375 g via INTRAVENOUS
  Filled 2024-10-10 (×10): qty 50

## 2024-10-10 MED ORDER — ONDANSETRON HCL 4 MG/2ML IJ SOLN
4.0000 mg | Freq: Four times a day (QID) | INTRAMUSCULAR | Status: DC | PRN
  Administered 2024-10-23 – 2024-10-26 (×3): 4 mg via INTRAVENOUS
  Filled 2024-10-10 (×3): qty 2

## 2024-10-10 MED ORDER — ACETAMINOPHEN 325 MG PO TABS: 650.0000 mg | ORAL_TABLET | Freq: Four times a day (QID) | ORAL | Status: DC | PRN

## 2024-10-10 MED ORDER — HYDROMORPHONE HCL 1 MG/ML IJ SOLN
1.0000 mg | Freq: Once | INTRAMUSCULAR | Status: AC
  Administered 2024-10-10: 1 mg via INTRAVENOUS
  Filled 2024-10-10: qty 1

## 2024-10-10 MED ORDER — ACETAMINOPHEN 650 MG RE SUPP: 650.0000 mg | Freq: Four times a day (QID) | RECTAL | Status: DC | PRN

## 2024-10-10 MED ORDER — IOHEXOL 300 MG/ML  SOLN
100.0000 mL | Freq: Once | INTRAMUSCULAR | Status: AC | PRN
  Administered 2024-10-10: 100 mL via INTRAVENOUS

## 2024-10-10 MED ORDER — PIPERACILLIN-TAZOBACTAM 3.375 G IVPB 30 MIN
3.3750 g | Freq: Once | INTRAVENOUS | Status: AC
  Administered 2024-10-10: 3.375 g via INTRAVENOUS
  Filled 2024-10-10: qty 50

## 2024-10-10 MED ORDER — NALOXONE HCL 0.4 MG/ML IJ SOLN: 0.4000 mg | INTRAMUSCULAR | Status: DC | PRN

## 2024-10-10 MED ORDER — ONDANSETRON HCL 4 MG/2ML IJ SOLN
4.0000 mg | Freq: Once | INTRAMUSCULAR | Status: AC
  Administered 2024-10-10: 4 mg via INTRAVENOUS
  Filled 2024-10-10: qty 2

## 2024-10-10 MED ORDER — SODIUM CHLORIDE 0.9 % IV BOLUS
1000.0000 mL | Freq: Once | INTRAVENOUS | Status: AC
  Administered 2024-10-10: 1000 mL via INTRAVENOUS

## 2024-10-10 MED ORDER — NICOTINE 21 MG/24HR TD PT24
21.0000 mg | MEDICATED_PATCH | Freq: Every day | TRANSDERMAL | Status: DC
  Administered 2024-10-11 – 2024-10-13 (×3): 21 mg via TRANSDERMAL
  Filled 2024-10-10 (×12): qty 1

## 2024-10-10 MED ORDER — SODIUM CHLORIDE 0.9 % IV SOLN: INTRAVENOUS | Status: DC

## 2024-10-10 NOTE — H&P (Signed)
 History and Physical    AMIIR HECKARD FMW:[id number] DOB: 04/22/1976 DOA: 10/10/2024  PCP: Patient, No Pcp Per  Patient coming from: Home  Chief Complaint: Abdominal pain  HPI: Logan Lucas is a 48 y.o. male with medical history significant of hypertension, diet-controlled diabetes, asthma, GI bleed, tobacco abuse, obesity (BMI 38.01).  Admitted to the hospital 9/30-10/04/2024 for acute diverticulitis complicated by possible perforation and small abscess.  This was managed conservatively with antibiotics.  Patient presents to the ED today with a chief complaint of abdominal pain.  He is endorsing severe left lower quadrant abdominal pain x 2 days associated with nausea but no episodes of vomiting.  Last bowel movement was 3 days ago. Denies fevers.  He is endorsing intermittent nonexertional left-sided chest pain and dyspnea since the onset of his abdominal pain 2 days ago which he attributes to anxiety.  Denies any active chest pain/pressure or dyspnea at this time.  He is requesting nicotine  patch as he smokes 1 pack of cigarettes daily and has been doing so since the age of 34.  He does not take any medications at home.  ED Course: Afebrile.  Not tachycardic or hypotensive.  Labs notable for WBC count 15.1, hemoglobin 14.3, bicarb 21, glucose 158, BUN 7, creatinine 0.95, AST 16, ALT 14, alk phos 131, T. bili 0.4, lipase 23.  CT abdomen pelvis showing extensive inflammatory changes within the sigmoid mesentery with punctate extraluminal gas consistent with perforated sigmoid diverticulitis but no pericolonic abscess seen.  There is extensive mural thickening without obstruction.  EDP discussed the case with Dr. Belinda on-call for general surgery who recommended admission by hospitalist service for medical management.  General surgery will consult in the morning.  Patient was given Dilaudid , Zofran , Zosyn , and 1 L normal saline.  Review of Systems:  Review of Systems  All other systems  reviewed and are negative.   Past Medical History:  Diagnosis Date   Asthma    Diabetes mellitus without complication (HCC)    GI bleed    Hypertension     Past Surgical History:  Procedure Laterality Date   LEFT HEART CATH AND CORONARY ANGIOGRAPHY N/A 01/25/2021   Procedure: LEFT HEART CATH AND CORONARY ANGIOGRAPHY;  Surgeon: Mady Bruckner, MD;  Location: [facility] INVASIVE CV LAB;  Service: Cardiovascular;  Laterality: N/A;   none       reports that he has been smoking cigarettes. He has a 5 pack-year smoking history. He has never used smokeless tobacco. He reports that he does not currently use alcohol after a past usage of about 7.0 standard drinks of alcohol per week. He reports that he does not use drugs.  Allergies  Allergen Reactions   Oxycodone  Itching    DENIES ALLERGY    Family History  Problem Relation Age of Onset   Diabetes Mother    Cancer Father     Prior to Admission medications   Medication Sig Start Date End Date Taking? Authorizing Provider  Blood Glucose Monitoring Suppl (BLOOD GLUCOSE MONITOR SYSTEM) w/Device KIT Use to test blood sugar in the morning, at noon, and at bedtime. 08/31/24   Lue Elsie BROCKS, MD  Glucose Blood (BLOOD GLUCOSE TEST STRIPS) STRP Use to test  blood sugar in the morning, at noon, and at bedtime. 08/31/24   Lue Elsie BROCKS, MD  ondansetron  (ZOFRAN ) 4 MG tablet Take 1 tablet (4 mg total) by mouth every 6 (six) hours as needed for nausea. 08/31/24   Lue Elsie BROCKS, MD  Physical Exam: Vitals:   10/10/24 1843 10/10/24 1853 10/10/24 1903  BP: 134/89 (!) 152/88   Pulse: 94 90   Resp: (!) 22 18   Temp: 98.5 F (36.9 C) 99 F (37.2 C)   TempSrc: Oral Oral   SpO2: 98% 98%   Weight: 113.4 kg  113.4 kg  Height: 5' 8 (1.727 m)  5' 8 (1.727 m)    Physical Exam Vitals reviewed.  Constitutional:      General: He is not in acute distress. HENT:     Head: Normocephalic and atraumatic.  Eyes:     Extraocular  Movements: Extraocular movements intact.  Cardiovascular:     Rate and Rhythm: Normal rate and regular rhythm.     Heart sounds: Normal heart sounds.  Pulmonary:     Effort: Pulmonary effort is normal. No respiratory distress.     Breath sounds: Normal breath sounds.  Abdominal:     General: Bowel sounds are normal. There is no distension.     Palpations: Abdomen is soft.     Tenderness: There is abdominal tenderness. There is guarding. There is no rebound.     Comments: Left lower quadrant tender to palpation with guarding  Musculoskeletal:     Cervical back: Normal range of motion.     Right lower leg: No edema.     Left lower leg: No edema.  Skin:    General: Skin is warm and dry.  Neurological:     General: No focal deficit present.     Mental Status: He is alert and oriented to person, place, and time.     Labs on Admission: I have personally reviewed following labs and imaging studies  CBC: Recent Labs  Lab 10/10/24 1921  WBC 15.1*  HGB 14.3  HCT 44.2  MCV 84.4  PLT 298   Basic Metabolic Panel: Recent Labs  Lab 10/10/24 1921  NA 135  K 3.9  CL 102  CO2 21*  GLUCOSE 158*  BUN 7  CREATININE 0.95  CALCIUM  9.0   GFR: Estimated Creatinine Clearance: 120 mL/min (by C-G formula based on SCr of 0.95 mg/dL). Liver Function Tests: Recent Labs  Lab 10/10/24 1921  AST 16  ALT 14  ALKPHOS 131*  BILITOT 0.4  PROT 7.1  ALBUMIN 3.8   Recent Labs  Lab 10/10/24 1921  LIPASE 23   No results for input(s): AMMONIA in the last 168 hours. Coagulation Profile: No results for input(s): INR, PROTIME in the last 168 hours. Cardiac Enzymes: No results for input(s): CKTOTAL, CKMB, CKMBINDEX, TROPONINI in the last 168 hours. BNP (last 3 results) No results for input(s): PROBNP in the last 8760 hours. HbA1C: No results for input(s): HGBA1C in the last 72 hours. CBG: No results for input(s): GLUCAP in the last 168 hours. Lipid Profile: No  results for input(s): CHOL, HDL, LDLCALC, TRIG, CHOLHDL, LDLDIRECT in the last 72 hours. Thyroid Function Tests: No results for input(s): TSH, T4TOTAL, FREET4, T3FREE, THYROIDAB in the last 72 hours. Anemia Panel: No results for input(s): VITAMINB12, FOLATE, FERRITIN, TIBC, IRON, RETICCTPCT in the last 72 hours. Urine analysis:    Component Value Date/Time   COLORURINE AMBER (A) 08/25/2024 0549   APPEARANCEUR CLEAR 08/25/2024 0549   APPEARANCEUR Hazy 02/10/2014 0521   LABSPEC 1.028 08/25/2024 0549   LABSPEC 1.024 02/10/2014 0521   PHURINE 5.0 08/25/2024 0549   GLUCOSEU NEGATIVE 08/25/2024 0549   GLUCOSEU Negative 02/10/2014 0521   HGBUR NEGATIVE 08/25/2024 0549   BILIRUBINUR NEGATIVE 08/25/2024  0549   BILIRUBINUR Negative 02/10/2014 0521   KETONESUR 20 (A) 08/25/2024 0549   PROTEINUR 30 (A) 08/25/2024 0549   NITRITE NEGATIVE 08/25/2024 0549   LEUKOCYTESUR NEGATIVE 08/25/2024 0549   LEUKOCYTESUR Negative 02/10/2014 0521    Radiological Exams on Admission: CT ABDOMEN PELVIS W CONTRAST Result Date: 10/10/2024 EXAM: CT ABDOMEN AND PELVIS WITH CONTRAST 10/10/2024 09:54:07 PM TECHNIQUE: CT of the abdomen and pelvis was performed with the administration of 100 mL of iohexol  (OMNIPAQUE ) 300 MG/ML solution. Multiplanar reformatted images are provided for review. Automated exposure control, iterative reconstruction, and/or weight-based adjustment of the mA/kV was utilized to reduce the radiation dose to as low as reasonably achievable. COMPARISON: 08/29/2024 and 08/13/2024. CLINICAL HISTORY: Diverticulitis, complication suspected; LLQ abdominal pain. FINDINGS: LOWER CHEST: No acute abnormality. LIVER: The liver is unremarkable. GALLBLADDER AND BILE DUCTS: Gallbladder is unremarkable. No biliary ductal dilatation. SPLEEN: No acute abnormality. PANCREAS: No acute abnormality. ADRENAL GLANDS: Bilateral adrenal nodules are stable since remote prior examination and are  compatible with lipid poor adenoma. These are benign and no further follow up imaging is recommended. KIDNEYS, URETERS AND BLADDER: Complex cystic lesion within the interval region of the left kidney demonstrating enhancing internal septations again identified and is stable measuring 2.5 cm in diameter. Dedicated nonemergent contrast-enhanced CT or MRI examination is recommended once the patient's acute issues have resolved. The kidneys are otherwise unremarkable. No stones in the kidneys or ureters. No hydronephrosis. No perinephric or periureteral stranding. Urinary bladder is unremarkable. GI AND BOWEL: Extensive inflammatory changes are again seen within the sigmoid mesentery with punctate foci of extraluminal gas in keeping with changes of perforated sigmoid diverticulitis. No loculated fluid collection is identified, however, to suggest development of a pericolonic abscess. Extensive mural thickening is again noted without evidence of obstruction. When reviewing the prior examination of 08/13/2024 it appears the area of diverticular inflammation noted on the original examination is separate from the current inflammatory process (image 72/2 versus image 65/2) in keeping with an adjacent, but novel site of diverticular inflammation. Background mild diverticulosis. The stomach, small bowel, and large bowel are otherwise unremarkable. Appendix normal. PERITONEUM AND RETROPERITONEUM: No ascites. No free air. VASCULATURE: Retroaortic left renal vein. Mild aortoiliac atherosclerotic calcification. No aortic aneurysm. LYMPH NODES: No lymphadenopathy. REPRODUCTIVE ORGANS: No acute abnormality. BONES AND SOFT TISSUES: No acute osseous abnormality. No focal soft tissue abnormality. IMPRESSION: 1. Extensive inflammatory changes within the sigmoid mesentery with punctate extraluminal gas, consistent with perforated sigmoid diverticulitis; no pericolonic abscess; extensive mural thickening without obstruction. Note that  this represents a novel site of diverticulitis when compared to prior examination of 08/13/2024 (image 65/2 vs 72/2) 2. 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 Stable 2.5 cm complex cystic lesion in the left kidney with enhancing septations; recommend dedicated nonemergent renal protocol MRI or CT without and with contrast after acute issues resolve (indeterminate cystic renal mass) 4. raf score: Aortic atherosclerosis (icd10-i70.0) Electronically signed by: Dorethia Molt MD 10/10/2024 10:13 PM EST RP Workstation: [id number]    Assessment and Plan  Recurrent perforated sigmoid diverticulitis No pericolonic abscess seen on CT.  WBC count 15.1 but does not meet any other SIRS criteria at this time.  General surgery consulted.  Keep NPO.  Continue Zosyn , IV fluid hydration, pain management, and antiemetic as needed.  Check lactate and trend WBC count.  Intermittent chest pain and dyspnea Patient is endorsing intermittent nonexertional left-sided chest pain and dyspnea since the onset of his abdominal  pain 2 days ago.  He attributes the symptoms to anxiety.  Denies any active chest pain/pressure or dyspnea at this time.  Not tachycardic, tachypneic, or hypoxic.  Lungs clear on exam.  Stat EKG and troponin ordered.  Hypertension Patient is not on antihypertensives at home.  Last recorded blood pressure 152/88 and pain could be contributing.  Continue pain management and monitor blood pressure closely.  Diet controlled diabetes Hemoglobin A1c 6.3 on 08/26/2024.  Asthma Stable, no signs of acute exacerbation.  Not on inhalers at home.  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  Radiologist recommending no further imaging follow-up.  Left kidney complex cystic lesion CT showing a stable 2.5 cm complex cystic lesion in the left kidney with enhancing septations.   Radiologist recommending dedicated nonemergent renal protocol MRI or CT without and with contrast for further evaluation after acute issues resolve.  Cigarette smoking Nicotine  patch ordered and counseled to quit.  DVT prophylaxis: SCDs Code Status: Full Code (discussed with the patient) Level of care: Progressive Care Unit Admission status: It is my clinical opinion that admission to INPATIENT is reasonable and necessary because of the expectation that this patient will require hospital care that crosses at least 2 midnights to treat this condition based on the medical complexity of the problems presented.  Given the aforementioned information, the predictability of an adverse outcome is felt to be significant.  Editha Ram MD [facility]  If 7PM-7AM, please contact night-coverage [email or web address]  10/10/2024, 10:51 PM

## 2024-10-10 NOTE — ED Triage Notes (Signed)
 Pt reports LLQ abdominal pain onset yesterday, worsens after eating. He reports it feels like a diverticulitis flare up and is feeling anxious about it. + nausea, denies vomiting, diarrhea or rectal bleeding.

## 2024-10-10 NOTE — ED Provider Notes (Signed)
 [facility] AT [facility] Provider Note   CSN: [id number] Arrival date & time: 10/10/24  8162     Patient presents with: Abdominal Pain   Logan Lucas is a 48 y.o. male.   HPI 48 year old male with a past history of diverticulitis as well as diabetes presents with abdominal pain.  Started 2 days ago and has been getting much worse.  This is currently very severe and primarily in his left lower abdomen.  He had nausea and constipation.  He has not had a fever.  Prior to Admission medications   Medication Sig Start Date End Date Taking? Authorizing Provider  Blood Glucose Monitoring Suppl (BLOOD GLUCOSE MONITOR SYSTEM) w/Device KIT Use to test blood sugar in the morning, at noon, and at bedtime. 08/31/24   Lue Elsie BROCKS, MD  Glucose Blood (BLOOD GLUCOSE TEST STRIPS) STRP Use to test  blood sugar in the morning, at noon, and at bedtime. 08/31/24   Lue Elsie BROCKS, MD  ondansetron  (ZOFRAN ) 4 MG tablet Take 1 tablet (4 mg total) by mouth every 6 (six) hours as needed for nausea. 08/31/24   Lue Elsie BROCKS, MD    Allergies: Oxycodone     Review of Systems  Constitutional:  Negative for fever.  Gastrointestinal:  Positive for abdominal pain, constipation and nausea. Negative for diarrhea and vomiting.    Updated Vital Signs BP (!) 152/88 (BP Location: Right Arm)   Pulse 90   Temp 99 F (37.2 C) (Oral)   Resp 18   Ht 5' 8 (1.727 m)   Wt 113.4 kg   SpO2 98%   BMI 38.01 kg/m   Physical Exam Vitals and nursing note reviewed.  Constitutional:      Appearance: He is well-developed.  HENT:     Head: Normocephalic and atraumatic.  Cardiovascular:     Rate and Rhythm: Normal rate and regular rhythm.     Heart sounds: Normal heart sounds.  Pulmonary:     Effort: Pulmonary effort is normal.     Breath sounds: Normal breath sounds.  Abdominal:     Palpations: Abdomen is soft.     Tenderness: There is abdominal tenderness  (generalized but worst in the LLQ).  Skin:    General: Skin is warm and dry.  Neurological:     Mental Status: He is alert.     (all labs ordered are listed, but only abnormal results are displayed) Labs Reviewed  COMPREHENSIVE METABOLIC PANEL WITH GFR - Abnormal; Notable for the following components:      Result Value   CO2 21 (*)    Glucose, Bld 158 (*)    Alkaline Phosphatase 131 (*)    All other components within normal limits  CBC - Abnormal; Notable for the following components:   WBC 15.1 (*)    All other components within normal limits  LIPASE, BLOOD  URINALYSIS, ROUTINE W REFLEX MICROSCOPIC    EKG: None  Radiology: CT ABDOMEN PELVIS W CONTRAST Result Date: 10/10/2024 EXAM: CT ABDOMEN AND PELVIS WITH CONTRAST 10/10/2024 09:54:07 PM TECHNIQUE: CT of the abdomen and pelvis was performed with the administration of 100 mL of iohexol  (OMNIPAQUE ) 300 MG/ML solution. Multiplanar reformatted images are provided for review. Automated exposure control, iterative reconstruction, and/or weight-based adjustment of the mA/kV was utilized to reduce the radiation dose to as low as reasonably achievable. COMPARISON: 08/29/2024 and 08/13/2024. CLINICAL HISTORY: Diverticulitis, complication suspected; LLQ abdominal pain. FINDINGS: LOWER CHEST: No acute abnormality. LIVER: The liver is  unremarkable. GALLBLADDER AND BILE DUCTS: Gallbladder is unremarkable. No biliary ductal dilatation. SPLEEN: No acute abnormality. PANCREAS: No acute abnormality. ADRENAL GLANDS: Bilateral adrenal nodules are stable since remote prior examination and are compatible with lipid poor adenoma. These are benign and no further follow up imaging is recommended. KIDNEYS, URETERS AND BLADDER: Complex cystic lesion within the interval region of the left kidney demonstrating enhancing internal septations again identified and is stable measuring 2.5 cm in diameter. Dedicated nonemergent contrast-enhanced CT or MRI examination is  recommended once the patient's acute issues have resolved. The kidneys are otherwise unremarkable. No stones in the kidneys or ureters. No hydronephrosis. No perinephric or periureteral stranding. Urinary bladder is unremarkable. GI AND BOWEL: Extensive inflammatory changes are again seen within the sigmoid mesentery with punctate foci of extraluminal gas in keeping with changes of perforated sigmoid diverticulitis. No loculated fluid collection is identified, however, to suggest development of a pericolonic abscess. Extensive mural thickening is again noted without evidence of obstruction. When reviewing the prior examination of 08/13/2024 it appears the area of diverticular inflammation noted on the original examination is separate from the current inflammatory process (image 72/2 versus image 65/2) in keeping with an adjacent, but novel site of diverticular inflammation. Background mild diverticulosis. The stomach, small bowel, and large bowel are otherwise unremarkable. Appendix normal. PERITONEUM AND RETROPERITONEUM: No ascites. No free air. VASCULATURE: Retroaortic left renal vein. Mild aortoiliac atherosclerotic calcification. No aortic aneurysm. LYMPH NODES: No lymphadenopathy. REPRODUCTIVE ORGANS: No acute abnormality. BONES AND SOFT TISSUES: No acute osseous abnormality. No focal soft tissue abnormality. IMPRESSION: 1. Extensive inflammatory changes within the sigmoid mesentery with punctate extraluminal gas, consistent with perforated sigmoid diverticulitis; no pericolonic abscess; extensive mural thickening without obstruction. Note that this represents a novel site of diverticulitis when compared to prior examination of 08/13/2024 (image 65/2 vs 72/2) 2. 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 Stable 2.5 cm complex cystic lesion in the left kidney with enhancing septations; recommend dedicated nonemergent renal protocol MRI or CT  without and with contrast after acute issues resolve (indeterminate cystic renal mass) 4. raf score: Aortic atherosclerosis (icd10-i70.0) Electronically signed by: Dorethia Molt MD 10/10/2024 10:13 PM EST RP Workstation: [id number]     Procedures   Medications Ordered in the ED  HYDROmorphone  (DILAUDID ) injection 1 mg (1 mg Intravenous Given 10/10/24 2115)  sodium chloride  0.9 % bolus 1,000 mL (1,000 mLs Intravenous New Bag/Given 10/10/24 2115)  ondansetron  (ZOFRAN ) injection 4 mg (4 mg Intravenous Given 10/10/24 2115)  iohexol  (OMNIPAQUE ) 300 MG/ML solution 100 mL (100 mLs Intravenous Contrast Given 10/10/24 2154)                                    Medical Decision Making Amount and/or Complexity of Data Reviewed External Data Reviewed: notes. Labs: ordered.    Details: Leukocytosis of 15 Radiology: ordered and independent interpretation performed.    Details: Diverticulitis.  Risk Prescription drug management. Decision regarding hospitalization.   CT shows recurrent diverticulitis though in a new area for him.  Also he appears to have a microperforation.  Vitals are okay, he is feeling better after some Dilaudid  and fluids.  Started on IV Zosyn .  I did discuss with Dr. Belinda, general surgery can see in the morning but at this point will need hospitalist admission and medical treatment.  Discussed with Dr. Alfornia.     Final diagnoses:  Diverticulitis    ED Discharge Orders     None          Freddi Hamilton, MD 10/10/24 2250

## 2024-10-11 ENCOUNTER — Inpatient Hospital Stay (HOSPITAL_COMMUNITY)

## 2024-10-11 DIAGNOSIS — K5792 Diverticulitis of intestine, part unspecified, without perforation or abscess without bleeding: Secondary | ICD-10-CM | POA: Diagnosis not present

## 2024-10-11 LAB — BASIC METABOLIC PANEL WITH GFR
Anion gap: 11 (ref 5–15)
BUN: 6 mg/dL (ref 6–20)
CO2: 22 mmol/L (ref 22–32)
Calcium: 8.5 mg/dL — ABNORMAL LOW (ref 8.9–10.3)
Chloride: 103 mmol/L (ref 98–111)
Creatinine, Ser: 0.89 mg/dL (ref 0.61–1.24)
GFR, Estimated: >60 mL/min (ref >60)
Glucose, Bld: 145 mg/dL — ABNORMAL HIGH (ref 70–99)
Potassium: 4 mmol/L (ref 3.5–5.1)
Sodium: 135 mmol/L (ref 135–145)

## 2024-10-11 LAB — CBC
HCT: 41.4 % (ref 39.0–52.0)
Hemoglobin: 13.1 g/dL (ref 13.0–17.0)
MCH: 27.1 pg (ref 26.0–34.0)
MCHC: 31.6 g/dL (ref 30.0–36.0)
MCV: 85.7 fL (ref 80.0–100.0)
Platelets: 264 K/uL (ref 150–400)
RBC: 4.83 MIL/uL (ref 4.22–5.81)
RDW: 14.7 % (ref 11.5–15.5)
WBC: 12.7 K/uL — ABNORMAL HIGH (ref 4.0–10.5)
nRBC: 0 % (ref 0.0–0.2)

## 2024-10-11 LAB — TROPONIN T, HIGH SENSITIVITY
Troponin T High Sensitivity: <15 ng/L (ref 0–19)
Troponin T High Sensitivity: <15 ng/L (ref 0–19)

## 2024-10-11 LAB — LACTIC ACID, PLASMA: Lactic Acid, Venous: 0.8 mmol/L (ref 0.5–1.9)

## 2024-10-11 MED ORDER — HYDROMORPHONE HCL 1 MG/ML IJ SOLN
1.0000 mg | INTRAMUSCULAR | Status: DC | PRN
  Administered 2024-10-11 – 2024-10-14 (×23): 1 mg via INTRAVENOUS
  Filled 2024-10-11 (×25): qty 1

## 2024-10-11 MED ORDER — ENOXAPARIN SODIUM 60 MG/0.6ML IJ SOSY
55.0000 mg | PREFILLED_SYRINGE | INTRAMUSCULAR | Status: DC
  Administered 2024-10-11 – 2024-10-13 (×3): 55 mg via SUBCUTANEOUS
  Filled 2024-10-11 (×3): qty 0.6

## 2024-10-11 MED ORDER — ORAL CARE MOUTH RINSE: 15.0000 mL | OROMUCOSAL | Status: DC | PRN

## 2024-10-11 MED ORDER — MELATONIN 3 MG PO TABS
3.0000 mg | ORAL_TABLET | Freq: Every day | ORAL | Status: DC
  Administered 2024-10-11 – 2024-10-26 (×16): 3 mg via ORAL
  Filled 2024-10-11 (×16): qty 1

## 2024-10-11 MED ORDER — ALBUTEROL SULFATE (2.5 MG/3ML) 0.083% IN NEBU: 2.5000 mg | INHALATION_SOLUTION | Freq: Four times a day (QID) | RESPIRATORY_TRACT | Status: DC | PRN

## 2024-10-11 MED ORDER — LACTATED RINGERS IV SOLN: INTRAVENOUS | Status: AC

## 2024-10-11 MED ORDER — PANTOPRAZOLE SODIUM 40 MG IV SOLR
40.0000 mg | Freq: Two times a day (BID) | INTRAVENOUS | Status: DC
  Administered 2024-10-11 – 2024-10-13 (×6): 40 mg via INTRAVENOUS
  Filled 2024-10-11 (×6): qty 10

## 2024-10-11 MED ORDER — HYDROCODONE-ACETAMINOPHEN 5-325 MG PO TABS
1.0000 | ORAL_TABLET | Freq: Four times a day (QID) | ORAL | Status: DC | PRN
Start: 2024-10-11 — End: 2024-10-12

## 2024-10-11 NOTE — ED Notes (Addendum)
 Writer entered room to fix pt's pump it was beeping. Pt again asked for pain medication. Writer informed pt she had just gotten new pts and would be back as soon as possible to administer pain medication due it still not being due until approximately 11am.

## 2024-10-11 NOTE — ED Notes (Addendum)
 Writer entered room to administer medication to pt. Pt asked about pain medication. Pt was informed that his medication was not due until 11am.

## 2024-10-11 NOTE — Progress Notes (Signed)
 PROGRESS NOTE    Logan Lucas  FMW:[id number] DOB: [birth year] DOA: 10/10/2024 PCP: Patient, No Pcp Per   Brief Narrative: 48 year old with past medical history significant for hypertension, diet-controlled diabetes, asthma, GI bleed, tobacco abuse, obesity BMI 38 who was admitted for acute diverticulitis complicated by possible perforation and a small abscess to the hospital from 9/30 until 09/01/19/2025.  He was treated with antibiotics.  He presents complaining of abdominal pain left lower quadrant associated with nausea vomiting.  Evaluation in the ED CT abdomen and pelvis show extensive inflammatory changes within the sigmoid mesentery with punctuate  extraluminal gas consistent with perforated sigmoid diverticulitis but no pericolonic abscess.  Extensive mural thickening without obstruction.  Admitted for diverticulitis with perforation started on IV antibiotics.  Surgery has been consulted.   Assessment & Plan:   Principal Problem:   Diverticulitis Active Problems:   Chest pain   Asthma, chronic   Essential hypertension   Diet-controlled diabetes mellitus (HCC)   1-Recurrent Perforated Sigmoid Diverticulitis: - Patient presented with recurrent abdominal pain, nausea vomiting.  CT scan showed inflammatory changes within the sigmoid and extraluminal gas consistent with perforated sigmoid diverticulitis.  Negative for pericolonic abscess. - Continue with IV fluids - Continue n.p.o. - General Surgery consulted - Continue IV Zosyn    Intermittent chest pain and dyspnea - Troponin less than 15 - EKG sinus rhythm, no significant changes from before. PRN nebulizer.  IV protonix .  Will check chest x ray  Hypertension: -not on BP medication. Monitor.   Diet-controlled diabetes: -Monitor CBG  Asthma: -PRN albuterol    Bilateral adrenal nodules on CT scan: - Follow-up as an outpatient  Left kidney complex cyst: -CT showing a stable 2.5 cm complex cystic lesion in  the left kidney with enhancing septations.  Needs nonemergent renal protocol MRI after acute issues resolved   Cigarette smoking -Counseling , nicotine  patch.    Estimated body mass index is 38.01 kg/m as calculated from the following:   Height as of this encounter: 5' 8 (1.727 m).   Weight as of this encounter: 113.4 kg.   DVT prophylaxis: start Lovenox  Code Status: Full code Family Communication: Care discussed with patient.  Disposition Plan:  Status is: Inpatient Remains inpatient appropriate because: management of diverticulitis    Consultants:  Surgery   Procedures:  none  Antimicrobials:    Subjective: He report abdominal pain, left side sharp.  Chest pain is on and off, stared when abdominal pain started.   Objective: Vitals:   10/10/24 1903 10/10/24 2324 10/11/24 0313 10/11/24 0727  BP:   119/79 112/73  Pulse:   75 75  Resp:   (!) 21 18  Temp:  98.7 F (37.1 C) 98.3 F (36.8 C) 98.2 F (36.8 C)  TempSrc:  Oral    SpO2:   100% 100%  Weight: 113.4 kg     Height: 5' 8 (1.727 m)       Intake/Output Summary (Last 24 hours) at 10/11/2024 0736 Last data filed at 10/10/2024 2349 Gross per 24 hour  Intake 1050 ml  Output --  Net 1050 ml   Filed Weights   10/10/24 1843 10/10/24 1903  Weight: 113.4 kg 113.4 kg    Examination:  General exam: Appears calm and comfortable  Respiratory system: Clear to auscultation. Respiratory effort normal. Cardiovascular system: S1 & S2 heard, RRR. No JVD, murmurs, rubs, gallops or clicks. No pedal edema. Gastrointestinal system: Abdomen soft, obesed, tender. No rigidity  Central nervous system: Alert and oriented. No focal  neurological deficits. Extremities: Symmetric 5 x 5 power.    Data Reviewed: I have personally reviewed following labs and imaging studies  CBC: Recent Labs  Lab 10/10/24 1921  WBC 15.1*  HGB 14.3  HCT 44.2  MCV 84.4  PLT 298   Basic Metabolic Panel: Recent Labs  Lab  10/10/24 1921 10/11/24 0020  NA 135 135  K 3.9 4.0  CL 102 103  CO2 21* 22  GLUCOSE 158* 145*  BUN 7 6  CREATININE 0.95 0.89  CALCIUM  9.0 8.5*   GFR: Estimated Creatinine Clearance: 128.1 mL/min (by C-G formula based on SCr of 0.89 mg/dL). Liver Function Tests: Recent Labs  Lab 10/10/24 1921  AST 16  ALT 14  ALKPHOS 131*  BILITOT 0.4  PROT 7.1  ALBUMIN 3.8   Recent Labs  Lab 10/10/24 1921  LIPASE 23   No results for input(s): AMMONIA in the last 168 hours. Coagulation Profile: No results for input(s): INR, PROTIME in the last 168 hours. Cardiac Enzymes: No results for input(s): CKTOTAL, CKMB, CKMBINDEX, TROPONINI in the last 168 hours. BNP (last 3 results) No results for input(s): PROBNP in the last 8760 hours. HbA1C: No results for input(s): HGBA1C in the last 72 hours. CBG: No results for input(s): GLUCAP in the last 168 hours. Lipid Profile: No results for input(s): CHOL, HDL, LDLCALC, TRIG, CHOLHDL, LDLDIRECT in the last 72 hours. Thyroid Function Tests: No results for input(s): TSH, T4TOTAL, FREET4, T3FREE, THYROIDAB in the last 72 hours. Anemia Panel: No results for input(s): VITAMINB12, FOLATE, FERRITIN, TIBC, IRON, RETICCTPCT in the last 72 hours. Sepsis Labs: Recent Labs  Lab 10/11/24 0014  LATICACIDVEN 0.8    No results found for this or any previous visit (from the past 240 hours).       Radiology Studies: CT ABDOMEN PELVIS W CONTRAST Result Date: 10/10/2024 EXAM: CT ABDOMEN AND PELVIS WITH CONTRAST 10/10/2024 09:54:07 PM TECHNIQUE: CT of the abdomen and pelvis was performed with the administration of 100 mL of iohexol  (OMNIPAQUE ) 300 MG/ML solution. Multiplanar reformatted images are provided for review. Automated exposure control, iterative reconstruction, and/or weight-based adjustment of the mA/kV was utilized to reduce the radiation dose to as low as reasonably achievable. COMPARISON:  08/29/2024 and 08/13/2024. CLINICAL HISTORY: Diverticulitis, complication suspected; LLQ abdominal pain. FINDINGS: LOWER CHEST: No acute abnormality. LIVER: The liver is unremarkable. GALLBLADDER AND BILE DUCTS: Gallbladder is unremarkable. No biliary ductal dilatation. SPLEEN: No acute abnormality. PANCREAS: No acute abnormality. ADRENAL GLANDS: Bilateral adrenal nodules are stable since remote prior examination and are compatible with lipid poor adenoma. These are benign and no further follow up imaging is recommended. KIDNEYS, URETERS AND BLADDER: Complex cystic lesion within the interval region of the left kidney demonstrating enhancing internal septations again identified and is stable measuring 2.5 cm in diameter. Dedicated nonemergent contrast-enhanced CT or MRI examination is recommended once the patient's acute issues have resolved. The kidneys are otherwise unremarkable. No stones in the kidneys or ureters. No hydronephrosis. No perinephric or periureteral stranding. Urinary bladder is unremarkable. GI AND BOWEL: Extensive inflammatory changes are again seen within the sigmoid mesentery with punctate foci of extraluminal gas in keeping with changes of perforated sigmoid diverticulitis. No loculated fluid collection is identified, however, to suggest development of a pericolonic abscess. Extensive mural thickening is again noted without evidence of obstruction. When reviewing the prior examination of 08/13/2024 it appears the area of diverticular inflammation noted on the original examination is separate from the current inflammatory process (image 72/2 versus image  65/2) in keeping with an adjacent, but novel site of diverticular inflammation. Background mild diverticulosis. The stomach, small bowel, and large bowel are otherwise unremarkable. Appendix normal. PERITONEUM AND RETROPERITONEUM: No ascites. No free air. VASCULATURE: Retroaortic left renal vein. Mild aortoiliac atherosclerotic calcification. No  aortic aneurysm. LYMPH NODES: No lymphadenopathy. REPRODUCTIVE ORGANS: No acute abnormality. BONES AND SOFT TISSUES: No acute osseous abnormality. No focal soft tissue abnormality. IMPRESSION: 1. Extensive inflammatory changes within the sigmoid mesentery with punctate extraluminal gas, consistent with perforated sigmoid diverticulitis; no pericolonic abscess; extensive mural thickening without obstruction. Note that this represents a novel site of diverticulitis when compared to prior examination of 08/13/2024 (image 65/2 vs 72/2) 2. 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 Stable 2.5 cm complex cystic lesion in the left kidney with enhancing septations; recommend dedicated nonemergent renal protocol MRI or CT without and with contrast after acute issues resolve (indeterminate cystic renal mass) 4. raf score: Aortic atherosclerosis (icd10-i70.0) Electronically signed by: Dorethia Molt MD 10/10/2024 10:13 PM EST RP Workstation: [id number]        Scheduled Meds:  nicotine   21 mg Transdermal Daily   Continuous Infusions:  sodium chloride  125 mL/hr at 10/10/24 2359   piperacillin -tazobactam (ZOSYN )  IV 3.375 g (10/11/24 0626)     LOS: 1 day    Time spent: 35 Minutes    Merlean Pizzini A Yaretsi Humphres, MD [facility]   If 7PM-7AM, please contact night-coverage [email or web address]  10/11/2024, 7:36 AM

## 2024-10-11 NOTE — Consult Note (Signed)
 CC: LLQ pain  Requesting provider: Dr Madelyne  HPI: Logan Lucas is an 48 y.o. male smoker with DM who is here for worsening LLQ pain and nausea.  He was hospitalized with diverticulitis ~1 month ago and treated with IV antibiotics.  He returns with recurrent pain and significant sigmoid inflammation seen on CT.  WBC is 15.    Past Medical History:  Diagnosis Date   Asthma    Diabetes mellitus without complication (HCC)    GI bleed    Hypertension     Past Surgical History:  Procedure Laterality Date   LEFT HEART CATH AND CORONARY ANGIOGRAPHY N/A 01/25/2021   Procedure: LEFT HEART CATH AND CORONARY ANGIOGRAPHY;  Surgeon: Mady Bruckner, MD;  Location: [facility] INVASIVE CV LAB;  Service: Cardiovascular;  Laterality: N/A;   none      Family History  Problem Relation Age of Onset   Diabetes Mother    Cancer Father     Social:  reports that he has been smoking cigarettes. He has a 5 pack-year smoking history. He has never used smokeless tobacco. He reports that he does not currently use alcohol after a past usage of about 7.0 standard drinks of alcohol per week. He reports that he does not use drugs.  Allergies:  Allergies  Allergen Reactions   Oxycodone  Itching    DENIES ALLERGY    Medications: I have reviewed the patient's current medications.  Results for orders placed or performed during the hospital encounter of 10/10/24 (from the past 48 hours)  Lipase, blood     Status: None   Collection Time: 10/10/24  7:21 PM  Result Value Ref Range   Lipase 23 11 - 51 U/L    Comment: Performed at [facility], [address], [place], [place] [address]  Comprehensive metabolic panel     Status: Abnormal   Collection Time: 10/10/24  7:21 PM  Result Value Ref Range   Sodium 135 135 - 145 mmol/L   Potassium 3.9 3.5 - 5.1 mmol/L   Chloride 102 98 - 111 mmol/L   CO2 21 (L) 22 - 32 mmol/L   Glucose, Bld 158 (H) 70 - 99 mg/dL    Comment: Glucose  reference range applies only to samples taken after fasting for at least 8 hours.   BUN 7 6 - 20 mg/dL   Creatinine, Ser 9.04 0.61 - 1.24 mg/dL   Calcium  9.0 8.9 - 10.3 mg/dL   Total Protein 7.1 6.5 - 8.1 g/dL   Albumin 3.8 3.5 - 5.0 g/dL   AST 16 15 - 41 U/L   ALT 14 0 - 44 U/L   Alkaline Phosphatase 131 (H) 38 - 126 U/L   Total Bilirubin 0.4 0.0 - 1.2 mg/dL   GFR, Estimated >39 >39 mL/min    Comment: (NOTE) Calculated using the CKD-EPI Creatinine Equation (2021)    Anion gap 12 5 - 15    Comment: Performed at [facility], [address], [place], [place] [address]  CBC     Status: Abnormal   Collection Time: 10/10/24  7:21 PM  Result Value Ref Range   WBC 15.1 (H) 4.0 - 10.5 K/uL   RBC 5.24 4.22 - 5.81 MIL/uL   Hemoglobin 14.3 13.0 - 17.0 g/dL   HCT 55.7 60.9 - 47.9 %   MCV 84.4 80.0 - 100.0 fL   MCH 27.3 26.0 - 34.0 pg   MCHC 32.4 30.0 - 36.0 g/dL  RDW 14.8 11.5 - 15.5 %   Platelets 298 150 - 400 K/uL   nRBC 0.0 0.0 - 0.2 %    Comment: Performed at [facility], [address], [place], [place] [address]  Lactic acid, plasma     Status: None   Collection Time: 10/11/24 12:14 AM  Result Value Ref Range   Lactic Acid, Venous 0.8 0.5 - 1.9 mmol/L    Comment: Performed at [facility], [address], [place], [place] [address]  Basic metabolic panel     Status: Abnormal   Collection Time: 10/11/24 12:20 AM  Result Value Ref Range   Sodium 135 135 - 145 mmol/L   Potassium 4.0 3.5 - 5.1 mmol/L   Chloride 103 98 - 111 mmol/L   CO2 22 22 - 32 mmol/L   Glucose, Bld 145 (H) 70 - 99 mg/dL    Comment: Glucose reference range applies only to samples taken after fasting for at least 8 hours.   BUN 6 6 - 20 mg/dL   Creatinine, Ser 9.10 0.61 - 1.24 mg/dL   Calcium  8.5 (L) 8.9 - 10.3 mg/dL   GFR, Estimated >39 >39 mL/min    Comment: (NOTE) Calculated using the CKD-EPI Creatinine Equation (2021)    Anion gap 11 5 - 15     Comment: Performed at [facility], [address] [place], [place] [address]  Troponin T, High Sensitivity     Status: None   Collection Time: 10/11/24 12:20 AM  Result Value Ref Range   Troponin T High Sensitivity <15 0 - 19 ng/L    Comment: (NOTE) Biotin concentrations > 1000 ng/mL falsely decrease TnT results.  Serial cardiac troponin measurements are suggested.  Refer to the Links section for chest pain algorithms and additional  guidance. Performed at [facility], [address] [place], [place] [address]   CBC     Status: Abnormal   Collection Time: 10/11/24  7:42 AM  Result Value Ref Range   WBC 12.7 (H) 4.0 - 10.5 K/uL   RBC 4.83 4.22 - 5.81 MIL/uL   Hemoglobin 13.1 13.0 - 17.0 g/dL   HCT 58.5 60.9 - 47.9 %   MCV 85.7 80.0 - 100.0 fL   MCH 27.1 26.0 - 34.0 pg   MCHC 31.6 30.0 - 36.0 g/dL   RDW 85.2 88.4 - 84.4 %   Platelets 264 150 - 400 K/uL   nRBC 0.0 0.0 - 0.2 %    Comment: Performed at [facility], [address] [place], [place] [address]  Troponin T, High Sensitivity     Status: None   Collection Time: 10/11/24  7:42 AM  Result Value Ref Range   Troponin T High Sensitivity <15 0 - 19 ng/L    Comment: (NOTE) Biotin concentrations > 1000 ng/mL falsely decrease TnT results.  Serial cardiac troponin measurements are suggested.  Refer to the Links section for chest pain algorithms and additional  guidance. Performed at [facility], [address], [place], [place] [address]     CT ABDOMEN PELVIS W CONTRAST Result Date: 10/10/2024 EXAM: CT ABDOMEN AND PELVIS WITH CONTRAST 10/10/2024 09:54:07 PM TECHNIQUE: CT of the abdomen and pelvis was performed with the administration of 100 mL of iohexol  (OMNIPAQUE ) 300 MG/ML solution. Multiplanar reformatted images are provided for review. Automated exposure control, iterative reconstruction, and/or weight-based adjustment of the mA/kV was  utilized to reduce the radiation dose to as low  as reasonably achievable. COMPARISON: 08/29/2024 and 08/13/2024. CLINICAL HISTORY: Diverticulitis, complication suspected; LLQ abdominal pain. FINDINGS: LOWER CHEST: No acute abnormality. LIVER: The liver is unremarkable. GALLBLADDER AND BILE DUCTS: Gallbladder is unremarkable. No biliary ductal dilatation. SPLEEN: No acute abnormality. PANCREAS: No acute abnormality. ADRENAL GLANDS: Bilateral adrenal nodules are stable since remote prior examination and are compatible with lipid poor adenoma. These are benign and no further follow up imaging is recommended. KIDNEYS, URETERS AND BLADDER: Complex cystic lesion within the interval region of the left kidney demonstrating enhancing internal septations again identified and is stable measuring 2.5 cm in diameter. Dedicated nonemergent contrast-enhanced CT or MRI examination is recommended once the patient's acute issues have resolved. The kidneys are otherwise unremarkable. No stones in the kidneys or ureters. No hydronephrosis. No perinephric or periureteral stranding. Urinary bladder is unremarkable. GI AND BOWEL: Extensive inflammatory changes are again seen within the sigmoid mesentery with punctate foci of extraluminal gas in keeping with changes of perforated sigmoid diverticulitis. No loculated fluid collection is identified, however, to suggest development of a pericolonic abscess. Extensive mural thickening is again noted without evidence of obstruction. When reviewing the prior examination of 08/13/2024 it appears the area of diverticular inflammation noted on the original examination is separate from the current inflammatory process (image 72/2 versus image 65/2) in keeping with an adjacent, but novel site of diverticular inflammation. Background mild diverticulosis. The stomach, small bowel, and large bowel are otherwise unremarkable. Appendix normal. PERITONEUM AND RETROPERITONEUM: No ascites. No free air.  VASCULATURE: Retroaortic left renal vein. Mild aortoiliac atherosclerotic calcification. No aortic aneurysm. LYMPH NODES: No lymphadenopathy. REPRODUCTIVE ORGANS: No acute abnormality. BONES AND SOFT TISSUES: No acute osseous abnormality. No focal soft tissue abnormality. IMPRESSION: 1. Extensive inflammatory changes within the sigmoid mesentery with punctate extraluminal gas, consistent with perforated sigmoid diverticulitis; no pericolonic abscess; extensive mural thickening without obstruction. Note that this represents a novel site of diverticulitis when compared to prior examination of 08/13/2024 (image 65/2 vs 72/2) 2. 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 Stable 2.5 cm complex cystic lesion in the left kidney with enhancing septations; recommend dedicated nonemergent renal protocol MRI or CT without and with contrast after acute issues resolve (indeterminate cystic renal mass) 4. raf score: Aortic atherosclerosis (icd10-i70.0) Electronically signed by: Dorethia Molt MD 10/10/2024 10:13 PM EST RP Workstation: [id number]    ROS - all of the below systems have been reviewed with the patient and positives are indicated with bold text General: chills, fever or night sweats Eyes: blurry vision or double vision ENT: epistaxis or sore throat Hematologic/Lymphatic: bleeding problems, blood clots or swollen lymph nodes Endocrine: temperature intolerance or unexpected weight changes Resp: cough, shortness of breath, or wheezing CV: chest pain or dyspnea on exertion GI: as per HPI GU: dysuria, trouble voiding, or hematuria Neuro: TIA or stroke symptoms    PE Blood pressure 112/73, pulse 75, temperature 98.2 F (36.8 C), resp. rate 18, height 5' 8 (1.727 m), weight 113.4 kg, SpO2 100%. Constitutional: NAD; conversant; no deformities Eyes: Moist conjunctiva; no lid lag; anicteric; PERRL Neck: Trachea midline; no  thyromegaly Lungs: Normal respiratory effort CV: RRR GI: Abd TTP LLQ MSK: Normal range of motion of extremities; no clubbing/cyanosis Psychiatric: Appropriate affect; alert and oriented x3  Results for orders placed or performed during the hospital encounter of 10/10/24 (from the past 48 hours)  Lipase, blood     Status: None   Collection Time: 10/10/24  7:21 PM  Result  Value Ref Range   Lipase 23 11 - 51 U/L    Comment: Performed at [facility], [address], [place], [place] [address]  Comprehensive metabolic panel     Status: Abnormal   Collection Time: 10/10/24  7:21 PM  Result Value Ref Range   Sodium 135 135 - 145 mmol/L   Potassium 3.9 3.5 - 5.1 mmol/L   Chloride 102 98 - 111 mmol/L   CO2 21 (L) 22 - 32 mmol/L   Glucose, Bld 158 (H) 70 - 99 mg/dL    Comment: Glucose reference range applies only to samples taken after fasting for at least 8 hours.   BUN 7 6 - 20 mg/dL   Creatinine, Ser 9.04 0.61 - 1.24 mg/dL   Calcium  9.0 8.9 - 10.3 mg/dL   Total Protein 7.1 6.5 - 8.1 g/dL   Albumin 3.8 3.5 - 5.0 g/dL   AST 16 15 - 41 U/L   ALT 14 0 - 44 U/L   Alkaline Phosphatase 131 (H) 38 - 126 U/L   Total Bilirubin 0.4 0.0 - 1.2 mg/dL   GFR, Estimated >39 >39 mL/min    Comment: (NOTE) Calculated using the CKD-EPI Creatinine Equation (2021)    Anion gap 12 5 - 15    Comment: Performed at [facility], [address], [place], [place] [address]  CBC     Status: Abnormal   Collection Time: 10/10/24  7:21 PM  Result Value Ref Range   WBC 15.1 (H) 4.0 - 10.5 K/uL   RBC 5.24 4.22 - 5.81 MIL/uL   Hemoglobin 14.3 13.0 - 17.0 g/dL   HCT 55.7 60.9 - 47.9 %   MCV 84.4 80.0 - 100.0 fL   MCH 27.3 26.0 - 34.0 pg   MCHC 32.4 30.0 - 36.0 g/dL   RDW 85.1 88.4 - 84.4 %   Platelets 298 150 - 400 K/uL   nRBC 0.0 0.0 - 0.2 %    Comment: Performed at [facility], [address], [place], [place] [address]  Lactic acid, plasma      Status: None   Collection Time: 10/11/24 12:14 AM  Result Value Ref Range   Lactic Acid, Venous 0.8 0.5 - 1.9 mmol/L    Comment: Performed at [facility], [address] [place], [place] [address]  Basic metabolic panel     Status: Abnormal   Collection Time: 10/11/24 12:20 AM  Result Value Ref Range   Sodium 135 135 - 145 mmol/L   Potassium 4.0 3.5 - 5.1 mmol/L   Chloride 103 98 - 111 mmol/L   CO2 22 22 - 32 mmol/L   Glucose, Bld 145 (H) 70 - 99 mg/dL    Comment: Glucose reference range applies only to samples taken after fasting for at least 8 hours.   BUN 6 6 - 20 mg/dL   Creatinine, Ser 9.10 0.61 - 1.24 mg/dL   Calcium  8.5 (L) 8.9 - 10.3 mg/dL   GFR, Estimated >39 >39 mL/min    Comment: (NOTE) Calculated using the CKD-EPI Creatinine Equation (2021)    Anion gap 11 5 - 15    Comment: Performed at [facility], [address], [place], [place] [address]  Troponin T, High Sensitivity     Status: None   Collection Time: 10/11/24 12:20 AM  Result Value Ref Range   Troponin T High Sensitivity <15 0 - 19 ng/L    Comment: (NOTE) Biotin concentrations >  1000 ng/mL falsely decrease TnT results.  Serial cardiac troponin measurements are suggested.  Refer to the Links section for chest pain algorithms and additional  guidance. Performed at [facility], [address], [place], [place] [address]   CBC     Status: Abnormal   Collection Time: 10/11/24  7:42 AM  Result Value Ref Range   WBC 12.7 (H) 4.0 - 10.5 K/uL   RBC 4.83 4.22 - 5.81 MIL/uL   Hemoglobin 13.1 13.0 - 17.0 g/dL   HCT 58.5 60.9 - 47.9 %   MCV 85.7 80.0 - 100.0 fL   MCH 27.1 26.0 - 34.0 pg   MCHC 31.6 30.0 - 36.0 g/dL   RDW 85.2 88.4 - 84.4 %   Platelets 264 150 - 400 K/uL   nRBC 0.0 0.0 - 0.2 %    Comment: Performed at [facility], [address] [place], [place] [address]  Troponin T, High Sensitivity     Status: None   Collection  Time: 10/11/24  7:42 AM  Result Value Ref Range   Troponin T High Sensitivity <15 0 - 19 ng/L    Comment: (NOTE) Biotin concentrations > 1000 ng/mL falsely decrease TnT results.  Serial cardiac troponin measurements are suggested.  Refer to the Links section for chest pain algorithms and additional  guidance. Performed at [facility], [address], [place], [place] [address]     CT ABDOMEN PELVIS W CONTRAST Result Date: 10/10/2024 EXAM: CT ABDOMEN AND PELVIS WITH CONTRAST 10/10/2024 09:54:07 PM TECHNIQUE: CT of the abdomen and pelvis was performed with the administration of 100 mL of iohexol  (OMNIPAQUE ) 300 MG/ML solution. Multiplanar reformatted images are provided for review. Automated exposure control, iterative reconstruction, and/or weight-based adjustment of the mA/kV was utilized to reduce the radiation dose to as low as reasonably achievable. COMPARISON: 08/29/2024 and 08/13/2024. CLINICAL HISTORY: Diverticulitis, complication suspected; LLQ abdominal pain. FINDINGS: LOWER CHEST: No acute abnormality. LIVER: The liver is unremarkable. GALLBLADDER AND BILE DUCTS: Gallbladder is unremarkable. No biliary ductal dilatation. SPLEEN: No acute abnormality. PANCREAS: No acute abnormality. ADRENAL GLANDS: Bilateral adrenal nodules are stable since remote prior examination and are compatible with lipid poor adenoma. These are benign and no further follow up imaging is recommended. KIDNEYS, URETERS AND BLADDER: Complex cystic lesion within the interval region of the left kidney demonstrating enhancing internal septations again identified and is stable measuring 2.5 cm in diameter. Dedicated nonemergent contrast-enhanced CT or MRI examination is recommended once the patient's acute issues have resolved. The kidneys are otherwise unremarkable. No stones in the kidneys or ureters. No hydronephrosis. No perinephric or periureteral stranding. Urinary bladder is unremarkable. GI AND  BOWEL: Extensive inflammatory changes are again seen within the sigmoid mesentery with punctate foci of extraluminal gas in keeping with changes of perforated sigmoid diverticulitis. No loculated fluid collection is identified, however, to suggest development of a pericolonic abscess. Extensive mural thickening is again noted without evidence of obstruction. When reviewing the prior examination of 08/13/2024 it appears the area of diverticular inflammation noted on the original examination is separate from the current inflammatory process (image 72/2 versus image 65/2) in keeping with an adjacent, but novel site of diverticular inflammation. Background mild diverticulosis. The stomach, small bowel, and large bowel are otherwise unremarkable. Appendix normal. PERITONEUM AND RETROPERITONEUM: No ascites. No free air. VASCULATURE: Retroaortic left renal vein. Mild aortoiliac atherosclerotic calcification. No aortic aneurysm. LYMPH NODES: No lymphadenopathy. REPRODUCTIVE ORGANS: No acute abnormality. BONES AND SOFT TISSUES: No  acute osseous abnormality. No focal soft tissue abnormality. IMPRESSION: 1. Extensive inflammatory changes within the sigmoid mesentery with punctate extraluminal gas, consistent with perforated sigmoid diverticulitis; no pericolonic abscess; extensive mural thickening without obstruction. Note that this represents a novel site of diverticulitis when compared to prior examination of 08/13/2024 (image 65/2 vs 72/2) 2. 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 Stable 2.5 cm complex cystic lesion in the left kidney with enhancing septations; recommend dedicated nonemergent renal protocol MRI or CT without and with contrast after acute issues resolve (indeterminate cystic renal mass) 4. raf score: Aortic atherosclerosis (icd10-i70.0) Electronically signed by: Dorethia Molt MD 10/10/2024 10:13 PM EST RP Workstation: [id number]      A/P: Logan Lucas is an 48 y.o. male with recurrent diverticulitis s/p medical management of a similar episode last month.  Rec NPO, IVFs and IV antibiotics.  Pt already started on Zosyn .  Will most likely require surgical management this admission.      Phillippa Straub C Joscelin Fray, MD  Colorectal and General Surgery [facility] Surgery  This required high medical decision making.

## 2024-10-12 DIAGNOSIS — K5792 Diverticulitis of intestine, part unspecified, without perforation or abscess without bleeding: Secondary | ICD-10-CM | POA: Diagnosis not present

## 2024-10-12 LAB — BASIC METABOLIC PANEL WITH GFR
Anion gap: 12 (ref 5–15)
BUN: 8 mg/dL (ref 6–20)
CO2: 21 mmol/L — ABNORMAL LOW (ref 22–32)
Calcium: 9 mg/dL (ref 8.9–10.3)
Chloride: 102 mmol/L (ref 98–111)
Creatinine, Ser: 0.87 mg/dL (ref 0.61–1.24)
GFR, Estimated: >60 mL/min (ref >60)
Glucose, Bld: 89 mg/dL (ref 70–99)
Potassium: 4 mmol/L (ref 3.5–5.1)
Sodium: 135 mmol/L (ref 135–145)

## 2024-10-12 LAB — CBC
HCT: 41.6 % (ref 39.0–52.0)
Hemoglobin: 13.2 g/dL (ref 13.0–17.0)
MCH: 27.3 pg (ref 26.0–34.0)
MCHC: 31.7 g/dL (ref 30.0–36.0)
MCV: 86.1 fL (ref 80.0–100.0)
Platelets: 270 K/uL (ref 150–400)
RBC: 4.83 MIL/uL (ref 4.22–5.81)
RDW: 14.1 % (ref 11.5–15.5)
WBC: 12.5 K/uL — ABNORMAL HIGH (ref 4.0–10.5)
nRBC: 0 % (ref 0.0–0.2)

## 2024-10-12 MED ORDER — TRAMADOL HCL 50 MG PO TABS: 50.0000 mg | ORAL_TABLET | Freq: Four times a day (QID) | ORAL | Status: DC | PRN

## 2024-10-12 MED ORDER — PNEUMOCOCCAL 20-VAL CONJ VACC 0.5 ML IM SUSY
0.5000 mL | PREFILLED_SYRINGE | INTRAMUSCULAR | Status: DC
  Filled 2024-10-12: qty 0.5

## 2024-10-12 MED ORDER — INFLUENZA VIRUS VACC SPLIT PF (FLUZONE) 0.5 ML IM SUSY
0.5000 mL | PREFILLED_SYRINGE | INTRAMUSCULAR | Status: AC
Start: 2024-10-13 — End: 2024-10-18
  Administered 2024-10-18: 0.5 mL via INTRAMUSCULAR
  Filled 2024-10-12: qty 0.5

## 2024-10-12 MED ORDER — ACETAMINOPHEN 500 MG PO TABS
1000.0000 mg | ORAL_TABLET | Freq: Four times a day (QID) | ORAL | Status: DC
  Administered 2024-10-12 – 2024-10-27 (×47): 1000 mg via ORAL
  Filled 2024-10-12 (×50): qty 2

## 2024-10-12 MED ORDER — LACTATED RINGERS IV SOLN: INTRAVENOUS | Status: AC

## 2024-10-12 MED ORDER — BOOST / RESOURCE BREEZE PO LIQD CUSTOM: 1.0000 | Freq: Two times a day (BID) | ORAL | Status: DC

## 2024-10-12 MED ORDER — BOOST / RESOURCE BREEZE PO LIQD CUSTOM
1.0000 | Freq: Two times a day (BID) | ORAL | Status: DC
  Administered 2024-10-13: 1 via ORAL

## 2024-10-12 MED ORDER — OXYCODONE HCL 5 MG PO TABS
5.0000 mg | ORAL_TABLET | Freq: Four times a day (QID) | ORAL | Status: DC | PRN
  Administered 2024-10-12 – 2024-10-13 (×3): 5 mg via ORAL
  Filled 2024-10-12 (×3): qty 1

## 2024-10-12 NOTE — Progress Notes (Signed)
 PROGRESS NOTE    Logan Lucas  FMW:[id number] DOB: Aug 18, 1976 DOA: 10/10/2024 PCP: Patient, No Pcp Per   Brief Narrative: 48 year old with past medical history significant for hypertension, diet-controlled diabetes, asthma, GI bleed, tobacco abuse, obesity BMI 38 who was admitted for acute diverticulitis complicated by possible perforation and a small abscess to the hospital from 9/30 until 09/01/19/2025.  He was treated with antibiotics.  He presents complaining of abdominal pain left lower quadrant associated with nausea vomiting.  Evaluation in the ED CT abdomen and pelvis show extensive inflammatory changes within the sigmoid mesentery with punctuate  extraluminal gas consistent with perforated sigmoid diverticulitis but no pericolonic abscess.  Extensive mural thickening without obstruction.  Admitted for diverticulitis with perforation started on IV antibiotics.  Surgery has been consulted.   Assessment & Plan:   Principal Problem:   Diverticulitis Active Problems:   Chest pain   Asthma, chronic   Essential hypertension   Diet-controlled diabetes mellitus (HCC)   1-Recurrent Perforated Sigmoid Diverticulitis: - Patient presented with recurrent abdominal pain, nausea vomiting.  CT scan showed inflammatory changes within the sigmoid and extraluminal gas consistent with perforated sigmoid diverticulitis.  Negative for pericolonic abscess. - Continue with IV fluids - Continue n.p.o. - General Surgery consulted, planing surgical intervention this admission.  - Continue IV Zosyn  -WBC at 12 today   Intermittent chest pain and dyspnea - Troponin less than 15 - EKG sinus rhythm, no significant changes from before. PRN nebulizer.  IV protonix .  Chest X ary: negative Chest pain  resolved.   Hypertension: -not on BP medication. Monitor.   Diet-controlled diabetes: -Monitor CBG  Asthma: -PRN albuterol    Bilateral adrenal nodules on CT scan: - Follow-up as an  outpatient  Left kidney complex cyst: -CT showing a stable 2.5 cm complex cystic lesion in the left kidney with enhancing septations.  Needs nonemergent renal protocol MRI after acute issues resolved   Cigarette smoking -Counseling , nicotine  patch.    Estimated body mass index is 38.01 kg/m as calculated from the following:   Height as of this encounter: 5' 8 (1.727 m).   Weight as of this encounter: 113.4 kg.   DVT prophylaxis: start Lovenox  Code Status: Full code Family Communication: Care discussed with patient.  Disposition Plan:  Status is: Inpatient Remains inpatient appropriate because: management of diverticulitis    Consultants:  Surgery   Procedures:  none  Antimicrobials:    Subjective: Still having abdominal pain, not significantly better.  Denies chest pain   Objective: Vitals:   10/11/24 2300 10/12/24 0253 10/12/24 0622 10/12/24 1334  BP: 126/76 (!) 141/80 (!) 146/88 (!) 144/93  Pulse:   80 73  Resp: 16 16 18 20   Temp: 98.7 F (37.1 C) 99.3 F (37.4 C) 98.6 F (37 C) 98.3 F (36.8 C)  TempSrc: Oral Oral Oral Oral  SpO2: 98% 97% 98% 100%  Weight:      Height:        Intake/Output Summary (Last 24 hours) at 10/12/2024 1424 Last data filed at 10/12/2024 1359 Gross per 24 hour  Intake 2974.97 ml  Output 2675 ml  Net 299.97 ml   Filed Weights   10/10/24 1843 10/10/24 1903  Weight: 113.4 kg 113.4 kg    Examination:  General exam: NAD Respiratory system: CTA Cardiovascular system: S 1, S 2 RRR Gastrointestinal system: BS present, soft, tender,  Central nervous system: alert Extremities: no edema    Data Reviewed: I have personally reviewed following labs and imaging studies  CBC: Recent Labs  Lab 10/10/24 1921 10/11/24 0742 10/12/24 0528  WBC 15.1* 12.7* 12.5*  HGB 14.3 13.1 13.2  HCT 44.2 41.4 41.6  MCV 84.4 85.7 86.1  PLT 298 264 270   Basic Metabolic Panel: Recent Labs  Lab 10/10/24 1921 10/11/24 0020  10/12/24 0528  NA 135 135 135  K 3.9 4.0 4.0  CL 102 103 102  CO2 21* 22 21*  GLUCOSE 158* 145* 89  BUN 7 6 8   CREATININE 0.95 0.89 0.87  CALCIUM  9.0 8.5* 9.0   GFR: Estimated Creatinine Clearance: 131 mL/min (by C-G formula based on SCr of 0.87 mg/dL). Liver Function Tests: Recent Labs  Lab 10/10/24 1921  AST 16  ALT 14  ALKPHOS 131*  BILITOT 0.4  PROT 7.1  ALBUMIN 3.8   Recent Labs  Lab 10/10/24 1921  LIPASE 23   No results for input(s): AMMONIA in the last 168 hours. Coagulation Profile: No results for input(s): INR, PROTIME in the last 168 hours. Cardiac Enzymes: No results for input(s): CKTOTAL, CKMB, CKMBINDEX, TROPONINI in the last 168 hours. BNP (last 3 results) No results for input(s): PROBNP in the last 8760 hours. HbA1C: No results for input(s): HGBA1C in the last 72 hours. CBG: No results for input(s): GLUCAP in the last 168 hours. Lipid Profile: No results for input(s): CHOL, HDL, LDLCALC, TRIG, CHOLHDL, LDLDIRECT in the last 72 hours. Thyroid Function Tests: No results for input(s): TSH, T4TOTAL, FREET4, T3FREE, THYROIDAB in the last 72 hours. Anemia Panel: No results for input(s): VITAMINB12, FOLATE, FERRITIN, TIBC, IRON, RETICCTPCT in the last 72 hours. Sepsis Labs: Recent Labs  Lab 10/11/24 0014  LATICACIDVEN 0.8    No results found for this or any previous visit (from the past 240 hours).       Radiology Studies: DG CHEST PORT 1 VIEW Result Date: 10/11/2024 EXAM: 1 VIEW(S) XRAY OF THE CHEST 10/11/2024 05:18:00 PM COMPARISON: 06/01/2024 CLINICAL HISTORY: Chest pain FINDINGS: LUNGS AND PLEURA: No focal pulmonary opacity. No pleural effusion. No pneumothorax. HEART AND MEDIASTINUM: No acute abnormality of the cardiac and mediastinal silhouettes. BONES AND SOFT TISSUES: No acute osseous abnormality. IMPRESSION: 1. No acute cardiopulmonary process. Electronically signed by: Katheleen Faes  MD 10/11/2024 06:09 PM EST RP Workstation: [id number]   CT ABDOMEN PELVIS W CONTRAST Result Date: 10/10/2024 EXAM: CT ABDOMEN AND PELVIS WITH CONTRAST 10/10/2024 09:54:07 PM TECHNIQUE: CT of the abdomen and pelvis was performed with the administration of 100 mL of iohexol  (OMNIPAQUE ) 300 MG/ML solution. Multiplanar reformatted images are provided for review. Automated exposure control, iterative reconstruction, and/or weight-based adjustment of the mA/kV was utilized to reduce the radiation dose to as low as reasonably achievable. COMPARISON: 08/29/2024 and 08/13/2024. CLINICAL HISTORY: Diverticulitis, complication suspected; LLQ abdominal pain. FINDINGS: LOWER CHEST: No acute abnormality. LIVER: The liver is unremarkable. GALLBLADDER AND BILE DUCTS: Gallbladder is unremarkable. No biliary ductal dilatation. SPLEEN: No acute abnormality. PANCREAS: No acute abnormality. ADRENAL GLANDS: Bilateral adrenal nodules are stable since remote prior examination and are compatible with lipid poor adenoma. These are benign and no further follow up imaging is recommended. KIDNEYS, URETERS AND BLADDER: Complex cystic lesion within the interval region of the left kidney demonstrating enhancing internal septations again identified and is stable measuring 2.5 cm in diameter. Dedicated nonemergent contrast-enhanced CT or MRI examination is recommended once the patient's acute issues have resolved. The kidneys are otherwise unremarkable. No stones in the kidneys or ureters. No hydronephrosis. No perinephric or periureteral stranding. Urinary bladder is unremarkable. GI AND  BOWEL: Extensive inflammatory changes are again seen within the sigmoid mesentery with punctate foci of extraluminal gas in keeping with changes of perforated sigmoid diverticulitis. No loculated fluid collection is identified, however, to suggest development of a pericolonic abscess. Extensive mural thickening is again noted without evidence of obstruction.  When reviewing the prior examination of 08/13/2024 it appears the area of diverticular inflammation noted on the original examination is separate from the current inflammatory process (image 72/2 versus image 65/2) in keeping with an adjacent, but novel site of diverticular inflammation. Background mild diverticulosis. The stomach, small bowel, and large bowel are otherwise unremarkable. Appendix normal. PERITONEUM AND RETROPERITONEUM: No ascites. No free air. VASCULATURE: Retroaortic left renal vein. Mild aortoiliac atherosclerotic calcification. No aortic aneurysm. LYMPH NODES: No lymphadenopathy. REPRODUCTIVE ORGANS: No acute abnormality. BONES AND SOFT TISSUES: No acute osseous abnormality. No focal soft tissue abnormality. IMPRESSION: 1. Extensive inflammatory changes within the sigmoid mesentery with punctate extraluminal gas, consistent with perforated sigmoid diverticulitis; no pericolonic abscess; extensive mural thickening without obstruction. Note that this represents a novel site of diverticulitis when compared to prior examination of 08/13/2024 (image 65/2 vs 72/2) 2. 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 Stable 2.5 cm complex cystic lesion in the left kidney with enhancing septations; recommend dedicated nonemergent renal protocol MRI or CT without and with contrast after acute issues resolve (indeterminate cystic renal mass) 4. raf score: Aortic atherosclerosis (icd10-i70.0) Electronically signed by: Dorethia Molt MD 10/10/2024 10:13 PM EST RP Workstation: [id number]        Scheduled Meds:  acetaminophen   1,000 mg Oral Q6H   enoxaparin  (LOVENOX ) injection  55 mg Subcutaneous Q24H   [START ON 10/13/2024] influenza vac split trivalent PF  0.5 mL Intramuscular Tomorrow-1000   melatonin  3 mg Oral QHS   nicotine   21 mg Transdermal Daily   pantoprazole  (PROTONIX ) IV  40 mg Intravenous Q12H   [START ON 10/13/2024]  pneumococcal 20-valent conjugate vaccine  0.5 mL Intramuscular Tomorrow-1000   Continuous Infusions:  lactated ringers  100 mL/hr at 10/12/24 1359   piperacillin -tazobactam (ZOSYN )  IV 12.5 mL/hr at 10/12/24 1359     LOS: 2 days    Time spent: 35 Minutes    Airel Magadan A Donell Sliwinski, MD [facility]   If 7PM-7AM, please contact night-coverage [email or web address]  10/12/2024, 2:24 PM

## 2024-10-12 NOTE — Progress Notes (Signed)
 Subjective: Patient not feeling any better today.  Still with pain in the LLQ.  ROS: See above, otherwise other systems negative  Objective: Vital signs in last 24 hours: Temp:  [98.2 F (36.8 C)-99.3 F (37.4 C)] 98.6 F (37 C) (11/17 0622) Pulse Rate:  [77-87] 80 (11/17 0622) Resp:  [16-19] 18 (11/17 0622) BP: (100-150)/(58-93) 146/88 (11/17 0622) SpO2:  [94 %-99 %] 98 % (11/17 0622) Last BM Date : 10/07/24  Intake/Output from previous day: 11/16 0701 - 11/17 0700 In: 3092.2 [I.V.:2909.9; IV Piggyback:182.4] Out: 1425 [Urine:1425] Intake/Output this shift: Total I/O In: 457.2 [I.V.:419.2; IV Piggyback:38.1] Out: 400 [Urine:400]  PE: Gen: NAD Lungs: respiratory effort nonlabored Abd: soft, tender with some voluntary guarding still in LLQ, ND  Lab Results:  Recent Labs    10/11/24 0742 10/12/24 0528  WBC 12.7* 12.5*  HGB 13.1 13.2  HCT 41.4 41.6  PLT 264 270   BMET Recent Labs    10/11/24 0020 10/12/24 0528  NA 135 135  K 4.0 4.0  CL 103 102  CO2 22 21*  GLUCOSE 145* 89  BUN 6 8  CREATININE 0.89 0.87  CALCIUM  8.5* 9.0   PT/INR No results for input(s): LABPROT, INR in the last 72 hours. CMP     Component Value Date/Time   NA 135 10/12/2024 0528   NA 143 07/25/2014 1105   K 4.0 10/12/2024 0528   K 4.0 07/25/2014 1105   CL 102 10/12/2024 0528   CL 108 (H) 07/25/2014 1105   CO2 21 (L) 10/12/2024 0528   CO2 23 07/25/2014 1105   GLUCOSE 89 10/12/2024 0528   GLUCOSE 76 07/25/2014 1105   BUN 8 10/12/2024 0528   BUN 8 07/25/2014 1105   CREATININE 0.87 10/12/2024 0528   CREATININE 1.26 07/25/2014 1105   CALCIUM  9.0 10/12/2024 0528   CALCIUM  8.8 07/25/2014 1105   PROT 7.1 10/10/2024 1921   PROT 7.7 02/10/2014 0521   ALBUMIN 3.8 10/10/2024 1921   ALBUMIN 4.2 02/10/2014 0521   AST 16 10/10/2024 1921   AST 31 02/10/2014 0521   ALT 14 10/10/2024 1921   ALT 35 02/10/2014 0521   ALKPHOS 131 (H) 10/10/2024 1921   ALKPHOS 112 02/10/2014  0521   BILITOT 0.4 10/10/2024 1921   BILITOT 0.3 02/10/2014 0521   GFRNONAA >60 10/12/2024 0528   GFRNONAA >60 07/25/2014 1105   GFRAA >60 08/11/2020 0115   GFRAA >60 07/25/2014 1105   Lipase     Component Value Date/Time   LIPASE 23 10/10/2024 1921       Studies/Results: DG CHEST PORT 1 VIEW Result Date: 10/11/2024 EXAM: 1 VIEW(S) XRAY OF THE CHEST 10/11/2024 05:18:00 PM COMPARISON: 06/01/2024 CLINICAL HISTORY: Chest pain FINDINGS: LUNGS AND PLEURA: No focal pulmonary opacity. No pleural effusion. No pneumothorax. HEART AND MEDIASTINUM: No acute abnormality of the cardiac and mediastinal silhouettes. BONES AND SOFT TISSUES: No acute osseous abnormality. IMPRESSION: 1. No acute cardiopulmonary process. Electronically signed by: Katheleen Faes MD 10/11/2024 06:09 PM EST RP Workstation: [id number]   CT ABDOMEN PELVIS W CONTRAST Result Date: 10/10/2024 EXAM: CT ABDOMEN AND PELVIS WITH CONTRAST 10/10/2024 09:54:07 PM TECHNIQUE: CT of the abdomen and pelvis was performed with the administration of 100 mL of iohexol  (OMNIPAQUE ) 300 MG/ML solution. Multiplanar reformatted images are provided for review. Automated exposure control, iterative reconstruction, and/or weight-based adjustment of the mA/kV was utilized to reduce the radiation dose to as low as reasonably achievable. COMPARISON: 08/29/2024 and 08/13/2024. CLINICAL HISTORY:  Diverticulitis, complication suspected; LLQ abdominal pain. FINDINGS: LOWER CHEST: No acute abnormality. LIVER: The liver is unremarkable. GALLBLADDER AND BILE DUCTS: Gallbladder is unremarkable. No biliary ductal dilatation. SPLEEN: No acute abnormality. PANCREAS: No acute abnormality. ADRENAL GLANDS: Bilateral adrenal nodules are stable since remote prior examination and are compatible with lipid poor adenoma. These are benign and no further follow up imaging is recommended. KIDNEYS, URETERS AND BLADDER: Complex cystic lesion within the interval region of the left kidney  demonstrating enhancing internal septations again identified and is stable measuring 2.5 cm in diameter. Dedicated nonemergent contrast-enhanced CT or MRI examination is recommended once the patient's acute issues have resolved. The kidneys are otherwise unremarkable. No stones in the kidneys or ureters. No hydronephrosis. No perinephric or periureteral stranding. Urinary bladder is unremarkable. GI AND BOWEL: Extensive inflammatory changes are again seen within the sigmoid mesentery with punctate foci of extraluminal gas in keeping with changes of perforated sigmoid diverticulitis. No loculated fluid collection is identified, however, to suggest development of a pericolonic abscess. Extensive mural thickening is again noted without evidence of obstruction. When reviewing the prior examination of 08/13/2024 it appears the area of diverticular inflammation noted on the original examination is separate from the current inflammatory process (image 72/2 versus image 65/2) in keeping with an adjacent, but novel site of diverticular inflammation. Background mild diverticulosis. The stomach, small bowel, and large bowel are otherwise unremarkable. Appendix normal. PERITONEUM AND RETROPERITONEUM: No ascites. No free air. VASCULATURE: Retroaortic left renal vein. Mild aortoiliac atherosclerotic calcification. No aortic aneurysm. LYMPH NODES: No lymphadenopathy. REPRODUCTIVE ORGANS: No acute abnormality. BONES AND SOFT TISSUES: No acute osseous abnormality. No focal soft tissue abnormality. IMPRESSION: 1. Extensive inflammatory changes within the sigmoid mesentery with punctate extraluminal gas, consistent with perforated sigmoid diverticulitis; no pericolonic abscess; extensive mural thickening without obstruction. Note that this represents a novel site of diverticulitis when compared to prior examination of 08/13/2024 (image 65/2 vs 72/2) 2. Stable bilateral adrenal nodules compatible with lipid-poor adenomas; no further  imaging follow-up recommended 3. Stable 2.5 cm complex cystic lesion in the left kidney with enhancing septations; recommend dedicated nonemergent renal protocol MRI or CT without and with contrast after acute issues resolve (indeterminate cystic renal mass) 4. raf score: Aortic atherosclerosis (icd10-i70.0) Electronically signed by: Dorethia Molt MD 10/10/2024 10:13 PM EST RP Workstation: [id number]    Anti-infectives: Anti-infectives (From admission, onward)    Start     Dose/Rate Route Frequency Ordered Stop   10/11/24 0600  piperacillin -tazobactam (ZOSYN ) IVPB 3.375 g        3.375 g 12.5 mL/hr over 240 Minutes Intravenous Every 8 hours 10/10/24 2350     10/10/24 2300  piperacillin -tazobactam (ZOSYN ) IVPB 3.375 g        3.375 g 100 mL/hr over 30 Minutes Intravenous  Once 10/10/24 2252 10/10/24 2349        Assessment/Plan Recurrent diverticulitis -patient likely to need Hartmann's this admission.  We discussed this today.  He understands this would results in a temporary colostomy.  He is agreeable -will allow sips of clears today, NPO p MN -we discussed surgery in the next 1-2 days -cont IV abx therapy -WOC consult for ostomy marking   FEN - NPO x sips of clears, IVFs per [facility] VTE - Lovenox  ID - zosyn   HTN DM asthma  I reviewed hospitalist notes, last 24 h vitals and pain scores, last 48 h intake and output, last 24 h labs and trends, and last 24 h imaging results.   LOS: 2  days    Burnard FORBES Banter , [facility] Surgery 10/12/2024, 9:33 AM Please see Amion for pager number during day hours 7:00am-4:30pm or 7:00am -11:30am on weekends

## 2024-10-12 NOTE — Consult Note (Addendum)
 WOC Nurse ostomy consult note Site Marking for Colostomy  Surgery scheduled for possibly tomorrow.Obtained permission to complete site marking to abd. WOC Nurse requested for preoperative stoma site marking  Discussed surgical procedure and stoma creation with patient and family.  Explained role of the WOC nurse team.  Provided the patient with educational booklet and provided with one appliance for him to handle. Answered patient questions. Limited assessment due to abdominal discomfort. He is of the understanding that he will have an ostomy for 6 months.  Examined patient lying and sitting in order to place the marking in the patient's visual field, no creases, scar tissue or abdominal contour issues and within the rectus muscle.   Marked for colostomy in the LLQ  __5__ cm to the left of the umbilicus and _0.5___cm above/below the umbilicus.  Marked for ileostomy in the RLQ  __5__cm to the right of the umbilicus and  ___0.5_ cm above/below the umbilicus.  Patient's abdomen cleansed with CHG wipes x 2 at site marking area, allowed to air dry prior to marking.Covered mark with thin film transparent dressing to preserve mark until date of surgery.   WOC Nurse team will follow up with patient after surgery for continue ostomy care and teaching.   Reviewed key points with patient what is an ostomy, skin care considerations, appliance sample, basic care needs, adjustment time,odor control. Answered all the patients questions.   Sherrilyn Hals MSN RN CWOCN WOC [facility]  [phone] (Available from 7-3 pm Mon-Friday)

## 2024-10-13 DIAGNOSIS — K5792 Diverticulitis of intestine, part unspecified, without perforation or abscess without bleeding: Secondary | ICD-10-CM | POA: Diagnosis not present

## 2024-10-13 LAB — CBC
HCT: 42.6 % (ref 39.0–52.0)
Hemoglobin: 13.6 g/dL (ref 13.0–17.0)
MCH: 27.3 pg (ref 26.0–34.0)
MCHC: 31.9 g/dL (ref 30.0–36.0)
MCV: 85.4 fL (ref 80.0–100.0)
Platelets: 270 K/uL (ref 150–400)
RBC: 4.99 MIL/uL (ref 4.22–5.81)
RDW: 14.1 % (ref 11.5–15.5)
WBC: 10.5 K/uL (ref 4.0–10.5)
nRBC: 0 % (ref 0.0–0.2)

## 2024-10-13 LAB — BASIC METABOLIC PANEL WITH GFR
Anion gap: 9 (ref 5–15)
BUN: 6 mg/dL (ref 6–20)
CO2: 26 mmol/L (ref 22–32)
Calcium: 9 mg/dL (ref 8.9–10.3)
Chloride: 101 mmol/L (ref 98–111)
Creatinine, Ser: 0.94 mg/dL (ref 0.61–1.24)
GFR, Estimated: >60 mL/min (ref >60)
Glucose, Bld: 127 mg/dL — ABNORMAL HIGH (ref 70–99)
Potassium: 4.2 mmol/L (ref 3.5–5.1)
Sodium: 136 mmol/L (ref 135–145)

## 2024-10-13 MED ORDER — HEPARIN SODIUM (PORCINE) 5000 UNIT/ML IJ SOLN: 5000.0000 [IU] | INTRAMUSCULAR | Status: DC

## 2024-10-13 MED ORDER — LACTATED RINGERS IV SOLN: INTRAVENOUS | Status: AC

## 2024-10-13 NOTE — Progress Notes (Signed)
 Pt continues to exhibit drug seeking behaviors r/t IV Dilaudid  administration. Explained to patient that the preferred method of administration is to give via running IV fluids, which the patient is currently connected to. Pt has asked this nurse multiple times during this shift to disconnect IVF and push IV Dilaudid  through pigtail catheter and flush afterwards. States every nurse has done this since I came into the hospital. I only feel it that way. Nurse educated patient again. Dr. Madelyne made aware.

## 2024-10-13 NOTE — Progress Notes (Signed)
 Progress Note     Subjective: Patient reports continued abdominal pain that is more prominent of the left abdomen. Has not had a BM but is having flatulence. Tolerated CLD yesterday without nausea and vomiting.   ROS  All negative with the exception of above.  Objective: Vital signs in last 24 hours: Temp:  [98.3 F (36.8 C)-98.8 F (37.1 C)] 98.6 F (37 C) (11/18 0402) Pulse Rate:  [73-81] 81 (11/18 0402) Resp:  [18-20] 18 (11/18 0402) BP: (116-144)/(70-93) 116/70 (11/18 0402) SpO2:  [97 %-100 %] 97 % (11/18 0402) Last BM Date : 10/07/24  Intake/Output from previous day: 11/17 0701 - 11/18 0700 In: 2424 [P.O.:360; I.V.:1914; IV Piggyback:150] Out: 2225 [Urine:2225] Intake/Output this shift: Total I/O In: 731.8 [I.V.:681.8; IV Piggyback:50] Out: -   PE: General: Pleasant male who is laying in bed in NAD. HEENT: Head is normocephalic, atraumatic. Heart:  Normal HR during encounter. Lungs: Respiratory effort nonlabored. Abd: Soft with mild distention. Generalized tenderness to palpation that is more prominent of the left upper quadrant and left lower quadrant. +BS MS: Able to move all extremities. Skin: Warm and dry Psych: A&Ox3 with an appropriate affect.    Lab Results:  Recent Labs    10/12/24 0528 10/13/24 0848  WBC 12.5* 10.5  HGB 13.2 13.6  HCT 41.6 42.6  PLT 270 270   BMET Recent Labs    10/12/24 0528 10/13/24 0848  NA 135 136  K 4.0 4.2  CL 102 101  CO2 21* 26  GLUCOSE 89 127*  BUN 8 6  CREATININE 0.87 0.94  CALCIUM  9.0 9.0   PT/INR No results for input(s): LABPROT, INR in the last 72 hours. CMP     Component Value Date/Time   NA 136 10/13/2024 0848   NA 143 07/25/2014 1105   K 4.2 10/13/2024 0848   K 4.0 07/25/2014 1105   CL 101 10/13/2024 0848   CL 108 (H) 07/25/2014 1105   CO2 26 10/13/2024 0848   CO2 23 07/25/2014 1105   GLUCOSE 127 (H) 10/13/2024 0848   GLUCOSE 76 07/25/2014 1105   BUN 6 10/13/2024 0848   BUN 8  07/25/2014 1105   CREATININE 0.94 10/13/2024 0848   CREATININE 1.26 07/25/2014 1105   CALCIUM  9.0 10/13/2024 0848   CALCIUM  8.8 07/25/2014 1105   PROT 7.1 10/10/2024 1921   PROT 7.7 02/10/2014 0521   ALBUMIN 3.8 10/10/2024 1921   ALBUMIN 4.2 02/10/2014 0521   AST 16 10/10/2024 1921   AST 31 02/10/2014 0521   ALT 14 10/10/2024 1921   ALT 35 02/10/2014 0521   ALKPHOS 131 (H) 10/10/2024 1921   ALKPHOS 112 02/10/2014 0521   BILITOT 0.4 10/10/2024 1921   BILITOT 0.3 02/10/2014 0521   GFRNONAA >60 10/13/2024 0848   GFRNONAA >60 07/25/2014 1105   GFRAA >60 08/11/2020 0115   GFRAA >60 07/25/2014 1105   Lipase     Component Value Date/Time   LIPASE 23 10/10/2024 1921       Studies/Results: DG CHEST PORT 1 VIEW Result Date: 10/11/2024 EXAM: 1 VIEW(S) XRAY OF THE CHEST 10/11/2024 05:18:00 PM COMPARISON: 06/01/2024 CLINICAL HISTORY: Chest pain FINDINGS: LUNGS AND PLEURA: No focal pulmonary opacity. No pleural effusion. No pneumothorax. HEART AND MEDIASTINUM: No acute abnormality of the cardiac and mediastinal silhouettes. BONES AND SOFT TISSUES: No acute osseous abnormality. IMPRESSION: 1. No acute cardiopulmonary process. Electronically signed by: Katheleen Faes MD 10/11/2024 06:09 PM EST RP Workstation: [id number]    Anti-infectives: Anti-infectives (From admission,  onward)    Start     Dose/Rate Route Frequency Ordered Stop   10/11/24 0600  piperacillin -tazobactam (ZOSYN ) IVPB 3.375 g        3.375 g 12.5 mL/hr over 240 Minutes Intravenous Every 8 hours 10/10/24 2350     10/10/24 2300  piperacillin -tazobactam (ZOSYN ) IVPB 3.375 g        3.375 g 100 mL/hr over 30 Minutes Intravenous  Once 10/10/24 2252 10/10/24 2349        Assessment/Plan Recurrent diverticulitis - Afebrile.  - CBC WNL. - Having continued abdominal pain that is more prominent of the left abdomen.  -Will allow sips of clears today, NPO p MN. Will plan for Hartmann's tomorrow 11/19. We briefly discussed  this today.  He understands this would results in a temporary colostomy.  He is agreeable to plan. -Cont IV abx therapy -WOC consult for ostomy has placed marking.   FEN - CLD, NPO at midnight, IVFs per TRH VTE - Lovenox  ID - Zosyn    HTN DM asthma    LOS: 3 days   I reviewed consulting provider notes, specialist notes, hospitalist notes, nursing notes, last 24 h vitals and pain scores, last 48 h intake and output, last 24 h labs and trends, and last 24 h imaging results.  This care required moderate level of medical decision making.    Marjorie Carlyon Favre, [facility] Surgery 10/13/2024, 10:49 AM Please see Amion for pager number during day hours 7:00am-4:30pm

## 2024-10-13 NOTE — Progress Notes (Signed)
 PROGRESS NOTE    Logan Lucas  FMW:[id number] DOB: May 01, 1976 DOA: 10/10/2024 PCP: Patient, No Pcp Per   Brief Narrative: 48 year old with past medical history significant for hypertension, diet-controlled diabetes, asthma, GI bleed, tobacco abuse, obesity BMI 38 who was admitted for acute diverticulitis complicated by possible perforation and a small abscess to the hospital from 9/30 until 09/01/19/2025.  He was treated with antibiotics.  He presents complaining of abdominal pain left lower quadrant associated with nausea vomiting.  Evaluation in the ED CT abdomen and pelvis show extensive inflammatory changes within the sigmoid mesentery with punctuate  extraluminal gas consistent with perforated sigmoid diverticulitis but no pericolonic abscess.  Extensive mural thickening without obstruction.  Admitted for diverticulitis with perforation started on IV antibiotics.  Surgery has been consulted.   Assessment & Plan:   Principal Problem:   Diverticulitis Active Problems:   Chest pain   Asthma, chronic   Essential hypertension   Diet-controlled diabetes mellitus (HCC)   1-Recurrent Perforated Sigmoid Diverticulitis: - Patient presented with recurrent abdominal pain, nausea vomiting.  CT scan showed inflammatory changes within the sigmoid and extraluminal gas consistent with perforated sigmoid diverticulitis.  Negative for pericolonic abscess. - Continue with IV fluids - Continue clear liquids  - General Surgery consulted, planing surgical intervention tomorrow - Continue IV Zosyn  -WBC trending down.   Intermittent chest pain and dyspnea - Troponin less than 15 - EKG sinus rhythm, no significant changes from before. PRN nebulizer.  IV protonix .  Chest X ary: negative Chest pain  resolved.   Hypertension: -not on BP medication. Monitor.   Diet-controlled diabetes: -Monitor CBG  Asthma: -PRN albuterol    Bilateral adrenal nodules on CT scan: - Follow-up as an  outpatient  Left kidney complex cyst: -CT showing a stable 2.5 cm complex cystic lesion in the left kidney with enhancing septations.  Needs nonemergent renal protocol MRI after acute issues resolved   Cigarette smoking -Counseling , nicotine  patch.    Estimated body mass index is 38.01 kg/m as calculated from the following:   Height as of this encounter: 5' 8 (1.727 m).   Weight as of this encounter: 113.4 kg.   DVT prophylaxis: start Lovenox  Code Status: Full code Family Communication: Care discussed with patient.  Disposition Plan:  Status is: Inpatient Remains inpatient appropriate because: management of diverticulitis    Consultants:  Surgery   Procedures:  none  Antimicrobials:    Subjective: Denies chest pain.  Still having abdominal pain.   Objective: Vitals:   10/12/24 0622 10/12/24 1334 10/12/24 2050 10/13/24 0402  BP: (!) 146/88 (!) 144/93 (!) 144/86 116/70  Pulse: 80 73 78 81  Resp: 18 20 18 18   Temp: 98.6 F (37 C) 98.3 F (36.8 C) 98.8 F (37.1 C) 98.6 F (37 C)  TempSrc: Oral Oral Oral Oral  SpO2: 98% 100% 99% 97%  Weight:      Height:        Intake/Output Summary (Last 24 hours) at 10/13/2024 1024 Last data filed at 10/13/2024 1000 Gross per 24 hour  Intake 2698.65 ml  Output 1825 ml  Net 873.65 ml   Filed Weights   10/10/24 1843 10/10/24 1903  Weight: 113.4 kg 113.4 kg    Examination:  General exam: NAD Respiratory system: CTA Cardiovascular system: S 1, S 2 RRR Gastrointestinal system: BS present, soft nt Central nervous system: Alert Extremities: no edema    Data Reviewed: I have personally reviewed following labs and imaging studies  CBC: Recent Labs  Lab 10/10/24 1921 10/11/24 0742 10/12/24 0528 10/13/24 0848  WBC 15.1* 12.7* 12.5* 10.5  HGB 14.3 13.1 13.2 13.6  HCT 44.2 41.4 41.6 42.6  MCV 84.4 85.7 86.1 85.4  PLT 298 264 270 270   Basic Metabolic Panel: Recent Labs  Lab 10/10/24 1921  10/11/24 0020 10/12/24 0528 10/13/24 0848  NA 135 135 135 136  K 3.9 4.0 4.0 4.2  CL 102 103 102 101  CO2 21* 22 21* 26  GLUCOSE 158* 145* 89 127*  BUN 7 6 8 6   CREATININE 0.95 0.89 0.87 0.94  CALCIUM  9.0 8.5* 9.0 9.0   GFR: Estimated Creatinine Clearance: 121.3 mL/min (by C-G formula based on SCr of 0.94 mg/dL). Liver Function Tests: Recent Labs  Lab 10/10/24 1921  AST 16  ALT 14  ALKPHOS 131*  BILITOT 0.4  PROT 7.1  ALBUMIN 3.8   Recent Labs  Lab 10/10/24 1921  LIPASE 23   No results for input(s): AMMONIA in the last 168 hours. Coagulation Profile: No results for input(s): INR, PROTIME in the last 168 hours. Cardiac Enzymes: No results for input(s): CKTOTAL, CKMB, CKMBINDEX, TROPONINI in the last 168 hours. BNP (last 3 results) No results for input(s): PROBNP in the last 8760 hours. HbA1C: No results for input(s): HGBA1C in the last 72 hours. CBG: No results for input(s): GLUCAP in the last 168 hours. Lipid Profile: No results for input(s): CHOL, HDL, LDLCALC, TRIG, CHOLHDL, LDLDIRECT in the last 72 hours. Thyroid Function Tests: No results for input(s): TSH, T4TOTAL, FREET4, T3FREE, THYROIDAB in the last 72 hours. Anemia Panel: No results for input(s): VITAMINB12, FOLATE, FERRITIN, TIBC, IRON, RETICCTPCT in the last 72 hours. Sepsis Labs: Recent Labs  Lab 10/11/24 0014  LATICACIDVEN 0.8    No results found for this or any previous visit (from the past 240 hours).       Radiology Studies: DG CHEST PORT 1 VIEW Result Date: 10/11/2024 EXAM: 1 VIEW(S) XRAY OF THE CHEST 10/11/2024 05:18:00 PM COMPARISON: 06/01/2024 CLINICAL HISTORY: Chest pain FINDINGS: LUNGS AND PLEURA: No focal pulmonary opacity. No pleural effusion. No pneumothorax. HEART AND MEDIASTINUM: No acute abnormality of the cardiac and mediastinal silhouettes. BONES AND SOFT TISSUES: No acute osseous abnormality. IMPRESSION: 1. No acute  cardiopulmonary process. Electronically signed by: Dayne Hassell MD 10/11/2024 06:09 PM EST RP Workstation: [id number]        Scheduled Meds:  acetaminophen   1,000 mg Oral Q6H   enoxaparin  (LOVENOX ) injection  55 mg Subcutaneous Q24H   feeding supplement  1 Container Oral BID BM   feeding supplement  1 Container Oral BID BM   influenza vac split trivalent PF  0.5 mL Intramuscular Tomorrow-1000   melatonin  3 mg Oral QHS   nicotine   21 mg Transdermal Daily   pantoprazole  (PROTONIX ) IV  40 mg Intravenous Q12H   pneumococcal 20-valent conjugate vaccine  0.5 mL Intramuscular Tomorrow-1000   Continuous Infusions:  lactated ringers  100 mL/hr at 10/13/24 1000   piperacillin -tazobactam (ZOSYN )  IV Stopped (10/13/24 0946)     LOS: 3 days    Time spent: 35 Minutes    Saba Neuman A Jhoana Upham, MD [facility]   If 7PM-7AM, please contact night-coverage [email or web address]  10/13/2024, 10:24 AM

## 2024-10-13 NOTE — H&P (View-Only) (Signed)
 Progress Note     Subjective: Patient reports continued abdominal pain that is more prominent of the left abdomen. Has not had a BM but is having flatulence. Tolerated CLD yesterday without nausea and vomiting.   ROS  All negative with the exception of above.  Objective: Vital signs in last 24 hours: Temp:  [98.3 F (36.8 C)-98.8 F (37.1 C)] 98.6 F (37 C) (11/18 0402) Pulse Rate:  [73-81] 81 (11/18 0402) Resp:  [18-20] 18 (11/18 0402) BP: (116-144)/(70-93) 116/70 (11/18 0402) SpO2:  [97 %-100 %] 97 % (11/18 0402) Last BM Date : 10/07/24  Intake/Output from previous day: 11/17 0701 - 11/18 0700 In: 2424 [P.O.:360; I.V.:1914; IV Piggyback:150] Out: 2225 [Urine:2225] Intake/Output this shift: Total I/O In: 731.8 [I.V.:681.8; IV Piggyback:50] Out: -   PE: General: Pleasant male who is laying in bed in NAD. HEENT: Head is normocephalic, atraumatic. Heart:  Normal HR during encounter. Lungs: Respiratory effort nonlabored. Abd: Soft with mild distention. Generalized tenderness to palpation that is more prominent of the left upper quadrant and left lower quadrant. +BS MS: Able to move all extremities. Skin: Warm and dry Psych: A&Ox3 with an appropriate affect.    Lab Results:  Recent Labs    10/12/24 0528 10/13/24 0848  WBC 12.5* 10.5  HGB 13.2 13.6  HCT 41.6 42.6  PLT 270 270   BMET Recent Labs    10/12/24 0528 10/13/24 0848  NA 135 136  K 4.0 4.2  CL 102 101  CO2 21* 26  GLUCOSE 89 127*  BUN 8 6  CREATININE 0.87 0.94  CALCIUM  9.0 9.0   PT/INR No results for input(s): LABPROT, INR in the last 72 hours. CMP     Component Value Date/Time   NA 136 10/13/2024 0848   NA 143 07/25/2014 1105   K 4.2 10/13/2024 0848   K 4.0 07/25/2014 1105   CL 101 10/13/2024 0848   CL 108 (H) 07/25/2014 1105   CO2 26 10/13/2024 0848   CO2 23 07/25/2014 1105   GLUCOSE 127 (H) 10/13/2024 0848   GLUCOSE 76 07/25/2014 1105   BUN 6 10/13/2024 0848   BUN 8  07/25/2014 1105   CREATININE 0.94 10/13/2024 0848   CREATININE 1.26 07/25/2014 1105   CALCIUM  9.0 10/13/2024 0848   CALCIUM  8.8 07/25/2014 1105   PROT 7.1 10/10/2024 1921   PROT 7.7 02/10/2014 0521   ALBUMIN 3.8 10/10/2024 1921   ALBUMIN 4.2 02/10/2014 0521   AST 16 10/10/2024 1921   AST 31 02/10/2014 0521   ALT 14 10/10/2024 1921   ALT 35 02/10/2014 0521   ALKPHOS 131 (H) 10/10/2024 1921   ALKPHOS 112 02/10/2014 0521   BILITOT 0.4 10/10/2024 1921   BILITOT 0.3 02/10/2014 0521   GFRNONAA >60 10/13/2024 0848   GFRNONAA >60 07/25/2014 1105   GFRAA >60 08/11/2020 0115   GFRAA >60 07/25/2014 1105   Lipase     Component Value Date/Time   LIPASE 23 10/10/2024 1921       Studies/Results: DG CHEST PORT 1 VIEW Result Date: 10/11/2024 EXAM: 1 VIEW(S) XRAY OF THE CHEST 10/11/2024 05:18:00 PM COMPARISON: 06/01/2024 CLINICAL HISTORY: Chest pain FINDINGS: LUNGS AND PLEURA: No focal pulmonary opacity. No pleural effusion. No pneumothorax. HEART AND MEDIASTINUM: No acute abnormality of the cardiac and mediastinal silhouettes. BONES AND SOFT TISSUES: No acute osseous abnormality. IMPRESSION: 1. No acute cardiopulmonary process. Electronically signed by: Katheleen Faes MD 10/11/2024 06:09 PM EST RP Workstation: [id number]    Anti-infectives: Anti-infectives (From admission,  onward)    Start     Dose/Rate Route Frequency Ordered Stop   10/11/24 0600  piperacillin -tazobactam (ZOSYN ) IVPB 3.375 g        3.375 g 12.5 mL/hr over 240 Minutes Intravenous Every 8 hours 10/10/24 2350     10/10/24 2300  piperacillin -tazobactam (ZOSYN ) IVPB 3.375 g        3.375 g 100 mL/hr over 30 Minutes Intravenous  Once 10/10/24 2252 10/10/24 2349        Assessment/Plan Recurrent diverticulitis - Afebrile.  - CBC WNL. - Having continued abdominal pain that is more prominent of the left abdomen.  -Will allow sips of clears today, NPO p MN. Will plan for Hartmann's tomorrow 11/19. We briefly discussed  this today.  He understands this would results in a temporary colostomy.  He is agreeable to plan. -Cont IV abx therapy -WOC consult for ostomy has placed marking.   FEN - CLD, NPO at midnight, IVFs per TRH VTE - Lovenox  ID - Zosyn    HTN DM asthma    LOS: 3 days   I reviewed consulting provider notes, specialist notes, hospitalist notes, nursing notes, last 24 h vitals and pain scores, last 48 h intake and output, last 24 h labs and trends, and last 24 h imaging results.  This care required moderate level of medical decision making.    Marjorie Carlyon Favre, [facility] 10/13/2024, 10:49 AM Please see Amion for pager number during day hours 7:00am-4:30pm

## 2024-10-13 NOTE — Progress Notes (Signed)
------------------------------------------  [facility] PROCEDURAL EXPEDITER NOTE-------------------------------------------------  Patient Name: Logan Lucas Patient DOB: 26-Apr-1976 Today's Date: @TODAY @   Chart reviewed:  Yes  Documentation gaps: n/a Orders in place:  Yes  Communication with surgical team if no orders: n/a Labs, test, and orders reviewed: yes Requires surgical clearance:  No What type of clearance: n/a Clearance received: n/a Patient status:in patient    Barriers noted:n/a  Intervention provided by [facility] team: n/a Barrier resolved:  Yes   Ronal Bald, RN [employer]

## 2024-10-13 NOTE — Anesthesia Preprocedure Evaluation (Signed)
 Anesthesia Evaluation  Patient identified by MRN, date of birth, ID band Patient awake    Reviewed: Allergy & Precautions, NPO status , Patient's Chart, lab work & pertinent test results  History of Anesthesia Complications Negative for: history of anesthetic complications  Airway Mallampati: II  TM Distance: >3 FB Neck ROM: Full    Dental no notable dental hx. (+) Missing, Dental Advisory Given, Teeth Intact,    Pulmonary asthma , Current Smoker and Patient abstained from smoking.   Pulmonary exam normal breath sounds clear to auscultation       Cardiovascular hypertension, (-) angina (-) Past MI Normal cardiovascular exam Rhythm:Regular Rate:Normal     Neuro/Psych  PSYCHIATRIC DISORDERS Anxiety        GI/Hepatic ,neg GERD  ,,diverticulitis   Endo/Other  diabetes    Renal/GU Lab Results      Component                Value               Date                                       K                        4.2                 10/13/2024                CO2                      26                  10/13/2024                BUN                      6                   10/13/2024                CREATININE               0.94                10/13/2024                     Musculoskeletal negative musculoskeletal ROS (+)    Abdominal  (+) + obese  Peds  Hematology Lab Results      Component                Value               Date                      WBC                      10.5                10/13/2024                HGB                      13.6  10/13/2024                HCT                      42.6                10/13/2024                MCV                      85.4                10/13/2024                PLT                      270                 10/13/2024              Anesthesia Other Findings All: oxycodone   Reproductive/Obstetrics negative OB ROS                               Anesthesia Physical Anesthesia Plan  ASA: 3  Anesthesia Plan: General   Post-op Pain Management: Lidocaine  infusion*, Ketamine IV*, Gabapentin  PO (pre-op)* and Tylenol  PO (pre-op)*   Induction: Intravenous  PONV Risk Score and Plan: 3 and Treatment may vary due to age or medical condition, Midazolam , Ondansetron  and Dexamethasone   Airway Management Planned: Oral ETT  Additional Equipment: None  Intra-op Plan:   Post-operative Plan: Extubation in OR  Informed Consent:      Dental advisory given  Plan Discussed with: CRNA and Surgeon  Anesthesia Plan Comments:          Anesthesia Quick Evaluation

## 2024-10-13 NOTE — Plan of Care (Signed)
  Problem: Education: Goal: Knowledge of General Education information will improve Description: Including pain rating scale, medication(s)/side effects and non-pharmacologic comfort measures Outcome: Progressing   Problem: Clinical Measurements: Goal: Ability to maintain clinical measurements within normal limits will improve Outcome: Progressing   Problem: Pain Managment: Goal: General experience of comfort will improve and/or be controlled Outcome: Not Progressing

## 2024-10-13 NOTE — TOC Initial Note (Signed)
 Transition of Care [facility]) - Initial/Assessment Note    Patient Details  Name: Logan Lucas MRN: [id number] Date of Birth: [birth year]  [facility]) CM/SW Contact:    Bascom Service, RN Phone Number: 10/13/2024, 1:36 PM  Clinical Narrative: Spoke to patient about d/c plans-return back home w/family/friends;Provided patient w/copy of demographic sheet-has medicaid-medicaid subscriber#, main insurance tel#-patient can find out about transportation resources, his PCP. Provided resources for tobacco use on AVS. Has own transport home.                  Expected Discharge Plan: Home/Self Care Barriers to Discharge: Continued Medical Work up   Patient Goals and CMS Choice Patient states their goals for this hospitalization and ongoing recovery are:: Home CMS Medicare.gov Compare Post Acute Care list provided to:: Patient Choice offered to / list presented to : Patient [place] ownership interest in [facility].provided to:: Patient    Expected Discharge Plan and Services   Discharge Planning Services: CM Consult   Living arrangements for the past 2 months: Single Family Home                                      Prior Living Arrangements/Services Living arrangements for the past 2 months: Single Family Home Lives with:: Relatives   Do you feel safe going back to the place where you live?: Yes               Activities of Daily Living   ADL Screening (condition at time of admission) Independently performs ADLs?: Yes (appropriate for developmental age) Is the patient deaf or have difficulty hearing?: No Does the patient have difficulty seeing, even when wearing glasses/contacts?: No Does the patient have difficulty concentrating, remembering, or making decisions?: No  Permission Sought/Granted Permission sought to share information with : Case Manager Permission granted to share information with : Yes, Verbal Permission Granted               Emotional Assessment              Admission diagnosis:  Diverticulitis [K57.92] Patient Active Problem List   Diagnosis Date Noted   Diverticulitis 10/10/2024   Diet-controlled diabetes mellitus (HCC) 10/10/2024   Diverticulitis of both large and small intestine with perforation with bleeding 08/25/2024   Acute diverticulitis 07/31/2022   Hypertriglyceridemia 06/05/2022   Anxiety 06/05/2022   Severe hyperglycemia due to diabetes mellitus (HCC) 05/24/2022   Cocaine abuse (HCC) 01/25/2021   Uncontrolled type 2 diabetes mellitus with hyperglycemia (HCC) 01/25/2021   Abnormal stress test    Alcohol use 01/23/2021   Rectal bleeding 01/23/2021   Asthma, chronic    Essential hypertension    Tobacco abuse    Duodenitis 10/14/2019   Chest pain 06/17/2018   PCP:  Patient, No Pcp Per Pharmacy:   [facility] [address] [place] - [address] [place] [address] Phone: [phone] Fax: [phone]  [facility] [address] ([address]), [place] - [address] [place] ([place]) [place] [address] Phone: [phone] Fax: [phone]  [id number] - [place], [place] - [address] AT [facility] [address] [place] [address] Phone: [phone] Fax: [phone]  [facility] [address] [place] [address] Phone: [phone] Fax: [phone]  [place] -  [facility] [address], [address] [place] [address] Phone: [phone] Fax: [phone]  [facility] [address] [place] ([place]), [place] - [address] [place] [address] Phone: [phone] Fax: [phone]  [place] - [facility] [address] [place] [address] Phone: [phone] Fax: [phone]     Social Drivers of Health (SDOH) Social  History: SDOH Screenings   Food Insecurity: No Food Insecurity (10/11/2024)  Housing: Low Risk  (10/11/2024)  Transportation Needs: No Transportation Needs (10/11/2024)  Utilities: Not At Risk (10/11/2024)  Depression (PHQ2-9): Low Risk  (06/05/2022)  Tobacco Use: High Risk (10/10/2024)   SDOH Interventions:     Readmission Risk Interventions    08/03/2022    4:22 PM  Readmission Risk Prevention Plan  Post Dischage Appt Complete  Medication Screening Complete  Transportation Screening Complete

## 2024-10-14 ENCOUNTER — Inpatient Hospital Stay (HOSPITAL_COMMUNITY): Payer: Self-pay | Admitting: Certified Registered Nurse Anesthetist

## 2024-10-14 ENCOUNTER — Encounter (HOSPITAL_COMMUNITY): Payer: Self-pay | Admitting: Internal Medicine

## 2024-10-14 ENCOUNTER — Encounter (HOSPITAL_COMMUNITY): Admission: EM | Disposition: A | Discharge status: Skilled Nursing Facility | Payer: Self-pay | Source: Home / Self Care

## 2024-10-14 DIAGNOSIS — E119 Type 2 diabetes mellitus without complications: Secondary | ICD-10-CM | POA: Diagnosis not present

## 2024-10-14 DIAGNOSIS — K5720 Diverticulitis of large intestine with perforation and abscess without bleeding: Secondary | ICD-10-CM | POA: Diagnosis not present

## 2024-10-14 DIAGNOSIS — F17200 Nicotine dependence, unspecified, uncomplicated: Secondary | ICD-10-CM | POA: Diagnosis not present

## 2024-10-14 DIAGNOSIS — K5792 Diverticulitis of intestine, part unspecified, without perforation or abscess without bleeding: Secondary | ICD-10-CM | POA: Diagnosis not present

## 2024-10-14 DIAGNOSIS — I10 Essential (primary) hypertension: Secondary | ICD-10-CM | POA: Diagnosis not present

## 2024-10-14 HISTORY — PX: COLOSTOMY: SHX63

## 2024-10-14 HISTORY — PX: LAPAROSCOPIC PARTIAL COLECTOMY: SHX5907

## 2024-10-14 LAB — TYPE AND SCREEN
ABO/RH(D): O POS
Antibody Screen: NEGATIVE

## 2024-10-14 LAB — BASIC METABOLIC PANEL WITH GFR
Anion gap: 10 (ref 5–15)
BUN: 6 mg/dL (ref 6–20)
CO2: 27 mmol/L (ref 22–32)
Calcium: 9.3 mg/dL (ref 8.9–10.3)
Chloride: 100 mmol/L (ref 98–111)
Creatinine, Ser: 1.11 mg/dL (ref 0.61–1.24)
GFR, Estimated: >60 mL/min (ref >60)
Glucose, Bld: 106 mg/dL — ABNORMAL HIGH (ref 70–99)
Potassium: 4.1 mmol/L (ref 3.5–5.1)
Sodium: 137 mmol/L (ref 135–145)

## 2024-10-14 LAB — CBC
HCT: 41.9 % (ref 39.0–52.0)
Hemoglobin: 13.4 g/dL (ref 13.0–17.0)
MCH: 27.2 pg (ref 26.0–34.0)
MCHC: 32 g/dL (ref 30.0–36.0)
MCV: 85 fL (ref 80.0–100.0)
Platelets: 301 K/uL (ref 150–400)
RBC: 4.93 MIL/uL (ref 4.22–5.81)
RDW: 13.9 % (ref 11.5–15.5)
WBC: 11 K/uL — ABNORMAL HIGH (ref 4.0–10.5)
nRBC: 0 % (ref 0.0–0.2)

## 2024-10-14 LAB — SURGICAL PCR SCREEN
MRSA, PCR: NEGATIVE
Staphylococcus aureus: NEGATIVE

## 2024-10-14 LAB — GLUCOSE, CAPILLARY: Glucose-Capillary: 179 mg/dL — ABNORMAL HIGH (ref 70–99)

## 2024-10-14 SURGERY — LAPAROSCOPIC PARTIAL COLECTOMY
Anesthesia: General

## 2024-10-14 MED ORDER — OXYCODONE HCL 5 MG PO TABS
5.0000 mg | ORAL_TABLET | Freq: Four times a day (QID) | ORAL | Status: DC | PRN
  Administered 2024-10-14: 10 mg via ORAL
  Administered 2024-10-14: 5 mg via ORAL
  Administered 2024-10-15 – 2024-10-16 (×5): 10 mg via ORAL
  Filled 2024-10-14 (×7): qty 2

## 2024-10-14 MED ORDER — PHENYLEPHRINE 80 MCG/ML (10ML) SYRINGE FOR IV PUSH (FOR BLOOD PRESSURE SUPPORT)
PREFILLED_SYRINGE | INTRAVENOUS | Status: DC | PRN
  Administered 2024-10-14: 80 ug via INTRAVENOUS

## 2024-10-14 MED ORDER — BUPIVACAINE-EPINEPHRINE (PF) 0.25% -1:200000 IJ SOLN
INTRAMUSCULAR | Status: AC
  Filled 2024-10-14: qty 30

## 2024-10-14 MED ORDER — ACETAMINOPHEN 10 MG/ML IV SOLN: 1000.0000 mg | Freq: Once | INTRAVENOUS | Status: DC | PRN

## 2024-10-14 MED ORDER — ENOXAPARIN SODIUM 60 MG/0.6ML IJ SOSY
55.0000 mg | PREFILLED_SYRINGE | INTRAMUSCULAR | Status: DC
  Administered 2024-10-15 – 2024-10-23 (×8): 55 mg via SUBCUTANEOUS
  Filled 2024-10-14 (×11): qty 0.6

## 2024-10-14 MED ORDER — ROCURONIUM BROMIDE 10 MG/ML (PF) SYRINGE
PREFILLED_SYRINGE | INTRAVENOUS | Status: AC
  Filled 2024-10-14: qty 10

## 2024-10-14 MED ORDER — FENTANYL CITRATE (PF) 100 MCG/2ML IJ SOLN
INTRAMUSCULAR | Status: AC
  Filled 2024-10-14: qty 2

## 2024-10-14 MED ORDER — HYDROMORPHONE HCL 1 MG/ML IJ SOLN
0.2500 mg | INTRAMUSCULAR | Status: DC | PRN
  Administered 2024-10-14 (×3): 0.5 mg via INTRAVENOUS

## 2024-10-14 MED ORDER — ONDANSETRON HCL 4 MG/2ML IJ SOLN: 4.0000 mg | Freq: Once | INTRAMUSCULAR | Status: DC | PRN

## 2024-10-14 MED ORDER — HYDRALAZINE HCL 20 MG/ML IJ SOLN
INTRAMUSCULAR | Status: DC | PRN
  Administered 2024-10-14: 2 mg via INTRAVENOUS
  Administered 2024-10-14 (×2): 4 mg via INTRAVENOUS

## 2024-10-14 MED ORDER — KETAMINE HCL 50 MG/5ML IJ SOSY
PREFILLED_SYRINGE | INTRAMUSCULAR | Status: AC
  Filled 2024-10-14: qty 5

## 2024-10-14 MED ORDER — HYDRALAZINE HCL 20 MG/ML IJ SOLN
INTRAMUSCULAR | Status: AC
  Filled 2024-10-14: qty 1

## 2024-10-14 MED ORDER — LIDOCAINE HCL (PF) 2 % IJ SOLN
INTRAMUSCULAR | Status: DC | PRN
  Administered 2024-10-14: 1.5 mg/kg/h

## 2024-10-14 MED ORDER — DEXMEDETOMIDINE HCL IN NACL 80 MCG/20ML IV SOLN
INTRAVENOUS | Status: DC | PRN
  Administered 2024-10-14: 8 ug via INTRAVENOUS

## 2024-10-14 MED ORDER — LIDOCAINE HCL (PF) 2 % IJ SOLN
INTRAMUSCULAR | Status: DC | PRN
Start: 2024-10-14 — End: 2024-10-14
  Administered 2024-10-14: 1 mg via INTRADERMAL
  Administered 2024-10-14: 1.5 mg/kg/h via INTRADERMAL

## 2024-10-14 MED ORDER — ROCURONIUM BROMIDE 10 MG/ML (PF) SYRINGE
PREFILLED_SYRINGE | INTRAVENOUS | Status: DC | PRN
  Administered 2024-10-14: 10 mg via INTRAVENOUS
  Administered 2024-10-14 (×2): 30 mg via INTRAVENOUS
  Administered 2024-10-14: 20 mg via INTRAVENOUS
  Administered 2024-10-14: 70 mg via INTRAVENOUS

## 2024-10-14 MED ORDER — CHLORHEXIDINE GLUCONATE CLOTH 2 % EX PADS
6.0000 | MEDICATED_PAD | Freq: Every day | CUTANEOUS | Status: DC
  Administered 2024-10-15 – 2024-10-27 (×9): 6 via TOPICAL

## 2024-10-14 MED ORDER — BUPIVACAINE-EPINEPHRINE 0.25% -1:200000 IJ SOLN
INTRAMUSCULAR | Status: DC | PRN
Start: 2024-10-14 — End: 2024-10-14
  Administered 2024-10-14: 6 mL

## 2024-10-14 MED ORDER — METHOCARBAMOL 500 MG PO TABS
500.0000 mg | ORAL_TABLET | Freq: Four times a day (QID) | ORAL | Status: DC | PRN
  Administered 2024-10-14 – 2024-10-15 (×2): 500 mg via ORAL
  Filled 2024-10-14 (×2): qty 1

## 2024-10-14 MED ORDER — PANTOPRAZOLE SODIUM 40 MG IV SOLR
40.0000 mg | INTRAVENOUS | Status: DC
  Administered 2024-10-15 – 2024-10-27 (×13): 40 mg via INTRAVENOUS
  Filled 2024-10-14 (×13): qty 10

## 2024-10-14 MED ORDER — PROPOFOL 10 MG/ML IV BOLUS
INTRAVENOUS | Status: AC
  Filled 2024-10-14: qty 20

## 2024-10-14 MED ORDER — HYDROMORPHONE HCL 2 MG/ML IJ SOLN
INTRAMUSCULAR | Status: AC
  Filled 2024-10-14: qty 1

## 2024-10-14 MED ORDER — MIDAZOLAM HCL (PF) 2 MG/2ML IJ SOLN
INTRAMUSCULAR | Status: DC | PRN
  Administered 2024-10-14: 2 mg via INTRAVENOUS

## 2024-10-14 MED ORDER — ONDANSETRON HCL 4 MG/2ML IJ SOLN
INTRAMUSCULAR | Status: DC | PRN
  Administered 2024-10-14: 4 mg via INTRAVENOUS

## 2024-10-14 MED ORDER — OXYCODONE HCL 5 MG/5ML PO SOLN: 5.0000 mg | Freq: Once | ORAL | Status: DC | PRN

## 2024-10-14 MED ORDER — LIDOCAINE HCL (PF) 2 % IJ SOLN
INTRAMUSCULAR | Status: AC
  Filled 2024-10-14: qty 5

## 2024-10-14 MED ORDER — 0.9 % SODIUM CHLORIDE (POUR BTL) OPTIME
TOPICAL | Status: DC | PRN
  Administered 2024-10-14: 1000 mL

## 2024-10-14 MED ORDER — DEXAMETHASONE SOD PHOSPHATE PF 10 MG/ML IJ SOLN
INTRAMUSCULAR | Status: DC | PRN
  Administered 2024-10-14: 5 mg via INTRAVENOUS

## 2024-10-14 MED ORDER — SUGAMMADEX SODIUM 200 MG/2ML IV SOLN
INTRAVENOUS | Status: DC | PRN
  Administered 2024-10-14: 400 mg via INTRAVENOUS

## 2024-10-14 MED ORDER — MIDAZOLAM HCL 2 MG/2ML IJ SOLN
INTRAMUSCULAR | Status: AC
  Filled 2024-10-14: qty 2

## 2024-10-14 MED ORDER — FENTANYL CITRATE (PF) 100 MCG/2ML IJ SOLN
INTRAMUSCULAR | Status: DC | PRN
  Administered 2024-10-14 (×5): 50 ug via INTRAVENOUS
  Administered 2024-10-14: 100 ug via INTRAVENOUS
  Administered 2024-10-14: 50 ug via INTRAVENOUS

## 2024-10-14 MED ORDER — OXYCODONE HCL 5 MG PO TABS: 5.0000 mg | ORAL_TABLET | Freq: Once | ORAL | Status: DC | PRN

## 2024-10-14 MED ORDER — LABETALOL HCL 5 MG/ML IV SOLN
INTRAVENOUS | Status: AC
  Filled 2024-10-14: qty 4

## 2024-10-14 MED ORDER — ONDANSETRON HCL 4 MG/2ML IJ SOLN
INTRAMUSCULAR | Status: AC
  Filled 2024-10-14: qty 2

## 2024-10-14 MED ORDER — HYDROMORPHONE HCL 1 MG/ML IJ SOLN
INTRAMUSCULAR | Status: AC
  Filled 2024-10-14: qty 2

## 2024-10-14 MED ORDER — PIPERACILLIN-TAZOBACTAM 3.375 G IVPB
3.3750 g | Freq: Three times a day (TID) | INTRAVENOUS | Status: AC
  Administered 2024-10-14 – 2024-10-15 (×3): 3.375 g via INTRAVENOUS
  Filled 2024-10-14 (×3): qty 50

## 2024-10-14 MED ORDER — KETAMINE HCL 10 MG/ML IJ SOLN
INTRAMUSCULAR | Status: DC | PRN
  Administered 2024-10-14: 30 mg via INTRAVENOUS

## 2024-10-14 MED ORDER — HYDROMORPHONE HCL 1 MG/ML IJ SOLN
INTRAMUSCULAR | Status: DC | PRN
  Administered 2024-10-14: .4 mg via INTRAVENOUS
  Administered 2024-10-14: .2 mg via INTRAVENOUS
  Administered 2024-10-14: .4 mg via INTRAVENOUS
  Administered 2024-10-14: .6 mg via INTRAVENOUS
  Administered 2024-10-14: .4 mg via INTRAVENOUS

## 2024-10-14 MED ORDER — PROPOFOL 10 MG/ML IV BOLUS
INTRAVENOUS | Status: DC | PRN
  Administered 2024-10-14: 150 mg via INTRAVENOUS

## 2024-10-14 MED ORDER — AMISULPRIDE (ANTIEMETIC) 5 MG/2ML IV SOLN: 10.0000 mg | Freq: Once | INTRAVENOUS | Status: DC | PRN

## 2024-10-14 MED ORDER — HYDROMORPHONE HCL 1 MG/ML IJ SOLN
1.0000 mg | INTRAMUSCULAR | Status: DC | PRN
  Administered 2024-10-14 – 2024-10-16 (×19): 1 mg via INTRAVENOUS
  Filled 2024-10-14 (×19): qty 1

## 2024-10-14 SURGICAL SUPPLY — 68 items
BAG COUNTER SPONGE SURGICOUNT (BAG) IMPLANT
BLADE EXTENDED COATED 6.5IN (ELECTRODE) ×1 IMPLANT
CANISTER WOUNDNEG PRESSURE 500 (CANNISTER) IMPLANT
CHLORAPREP W/TINT 26 (MISCELLANEOUS) ×1 IMPLANT
CLIP APPLIE 5 13 M/L LIGAMAX5 (MISCELLANEOUS) IMPLANT
CLIP APPLIE ROT 10 11.4 M/L (STAPLE) IMPLANT
CLIP TI LARGE 6 (CLIP) IMPLANT
COUNTER NEEDLE 1200 MAGNETIC (NEEDLE) ×1 IMPLANT
COVER MAYO STAND STRL (DRAPES) ×3 IMPLANT
COVER SURGICAL LIGHT HANDLE (MISCELLANEOUS) ×1 IMPLANT
DRAIN CHANNEL 19F RND (DRAIN) IMPLANT
DRAPE LAPAROSCOPIC ABDOMINAL (DRAPES) ×1 IMPLANT
DRSG OPSITE POSTOP 4X6 (GAUZE/BANDAGES/DRESSINGS) IMPLANT
DRSG OPSITE POSTOP 4X8 (GAUZE/BANDAGES/DRESSINGS) IMPLANT
DRSG TELFA 3X8 NADH STRL (GAUZE/BANDAGES/DRESSINGS) IMPLANT
DRSG VAC GRANUFOAM MED (GAUZE/BANDAGES/DRESSINGS) IMPLANT
ELECT REM PT RETURN 15FT ADLT (MISCELLANEOUS) ×1 IMPLANT
EVACUATOR SILICONE 100CC (DRAIN) ×1 IMPLANT
GAUZE SPONGE 4X4 12PLY STRL (GAUZE/BANDAGES/DRESSINGS) ×1 IMPLANT
GLOVE BIO SURGEON STRL SZ7.5 (GLOVE) ×1 IMPLANT
GLOVE INDICATOR 8.0 STRL GRN (GLOVE) ×1 IMPLANT
GOWN STRL REUS W/ TWL XL LVL3 (GOWN DISPOSABLE) ×2 IMPLANT
IRRIGATION SUCT STRKRFLW 2 WTP (MISCELLANEOUS) ×1 IMPLANT
KIT TURNOVER KIT A (KITS) ×1 IMPLANT
LEGGING LITHOTOMY PAIR STRL (DRAPES) IMPLANT
LIGASURE IMPACT 36 18CM CVD LR (INSTRUMENTS) IMPLANT
NS IRRIG 1000ML POUR BTL (IV SOLUTION) ×2 IMPLANT
PACK COLON (CUSTOM PROCEDURE TRAY) ×1 IMPLANT
PACK GENERAL/GYN (CUSTOM PROCEDURE TRAY) ×1 IMPLANT
PAD POSITIONING PINK XL (MISCELLANEOUS) IMPLANT
PENCIL SMOKE EVACUATOR (MISCELLANEOUS) IMPLANT
PROTECTOR NERVE ULNAR (MISCELLANEOUS) IMPLANT
RELOAD STAPLE 40 GRN THCK (ENDOMECHANICALS) IMPLANT
RETRACTOR WND ALEXIS 18 MED (MISCELLANEOUS) IMPLANT
RETRACTOR WND ALEXIS 25 LRG (MISCELLANEOUS) IMPLANT
SCISSORS LAP 5X35 DISP (ENDOMECHANICALS) IMPLANT
SEPRAFILM MEMBRANE 5X6 (MISCELLANEOUS) IMPLANT
SEPRAFILM PROCEDURAL PACK 3X5 (MISCELLANEOUS) IMPLANT
SET TUBE SMOKE EVAC HIGH FLOW (TUBING) ×1 IMPLANT
SHEARS FOC LG CVD HARMONIC 17C (MISCELLANEOUS) IMPLANT
SHEARS HARMONIC 36 ACE (MISCELLANEOUS) ×1 IMPLANT
SLEEVE ADV FIXATION 5X100MM (TROCAR) IMPLANT
SLEEVE Z-THREAD 5X100MM (TROCAR) ×2 IMPLANT
STAPLER CVD CUT GRN 40 RELOAD (ENDOMECHANICALS) IMPLANT
STAPLER SKIN PROX 35W (STAPLE) ×1 IMPLANT
SUT ETHILON 2 0 PS N (SUTURE) IMPLANT
SUT PDS AB 0 CTX 60 (SUTURE) IMPLANT
SUT PDS AB 1 TP1 96 (SUTURE) IMPLANT
SUT PDS AB 3-0 SH 27 (SUTURE) ×1 IMPLANT
SUT PDS AB 4-0 SH 27 (SUTURE) IMPLANT
SUT PROLENE 2 0 BLUE (SUTURE) IMPLANT
SUT SILK 2 0 SH CR/8 (SUTURE) ×2 IMPLANT
SUT SILK 2 0SH CR/8 30 (SUTURE) IMPLANT
SUT SILK 2-0 18XBRD TIE 12 (SUTURE) ×2 IMPLANT
SUT SILK 2-0 30XBRD TIE 12 (SUTURE) IMPLANT
SUT SILK 3 0 SH CR/8 (SUTURE) ×2 IMPLANT
SUT SILK 3-0 18XBRD TIE 12 (SUTURE) ×2 IMPLANT
SUT VIC AB 1 CTX 18 (SUTURE) IMPLANT
SUT VIC AB 3-0 SH 18 (SUTURE) IMPLANT
SYSTEM LAPSCP GELPORT 120MM (MISCELLANEOUS) IMPLANT
TAPE UMBILICAL 1/8 X36 TWILL (MISCELLANEOUS) IMPLANT
TOWEL OR DSP ST BLU DLX 10/PK (DISPOSABLE) ×1 IMPLANT
TRAY FOLEY MTR SLVR 16FR STAT (SET/KITS/TRAYS/PACK) ×1 IMPLANT
TROCAR 11X100 Z THREAD (TROCAR) IMPLANT
TROCAR ADV FIXATION 12X100MM (TROCAR) IMPLANT
TROCAR ADV FIXATION 5X100MM (TROCAR) IMPLANT
TROCAR XCEL NON-BLD 5MMX100MML (ENDOMECHANICALS) IMPLANT
TROCAR Z-THREAD OPTICAL 5X100M (TROCAR) ×1 IMPLANT

## 2024-10-14 NOTE — Progress Notes (Signed)
 Patient arrived back to the floor from PACU and was able to ambulate from stretcher to bed with assistance. Wound vac in place and port sites with minimal drainage. Patient with complaints of 10/10 pain, PRN medication administered. Will continue to monitor.

## 2024-10-14 NOTE — Transfer of Care (Signed)
 Immediate Anesthesia Transfer of Care Note  Patient: Logan Lucas  Procedure(s) Performed: Procedure(s): LAPAROSCOPIC PARTIAL COLECTOMY AND SPLENIC FLEXURE MOBILIZATION (N/A) CREATION, COLOSTOMY (N/A)  Patient Location: PACU  Anesthesia Type:General  Level of Consciousness: Patient easily awoken, comfortable, cooperative, following commands, responds to stimulation.   Airway & Oxygen Therapy: Patient spontaneously breathing, ventilating well, oxygen via simple oxygen mask.  Post-op Assessment: Report given to PACU RN, vital signs reviewed and stable, moving all extremities.   Post vital signs: Reviewed and stable.  Complications: No apparent anesthesia complications Last Vitals:  Vitals Value Taken Time  BP 149/85 10/14/24 14:15  Temp    Pulse 98 10/14/24 14:16  Resp 22 10/14/24 14:16  SpO2 97 % 10/14/24 14:16  Vitals shown include unfiled device data.  Last Pain:  Vitals:   10/14/24 0910  TempSrc:   PainSc: 6       Patients Stated Pain Goal: 2 (10/13/24 1934)  Complications: No notable events documented.

## 2024-10-14 NOTE — Anesthesia Procedure Notes (Addendum)
 Procedure Name: Intubation Date/Time: 10/14/2024 10:52 AM  Performed by: Joshua Vernell BROCKS, CRNAPre-anesthesia Checklist: Patient identified, Emergency Drugs available, Suction available and Patient being monitored Patient Re-evaluated:Patient Re-evaluated prior to induction Oxygen Delivery Method: Circle system utilized Preoxygenation: Pre-oxygenation with 100% oxygen Induction Type: IV induction Ventilation: Mask ventilation without difficulty Laryngoscope Size: Miller Grade View: Grade I Tube type: Oral Tube size: 7.5 mm Number of attempts: 1 Airway Equipment and Method: Stylet and Oral airway Placement Confirmation: ETT inserted through vocal cords under direct vision, positive ETCO2 and breath sounds checked- equal and bilateral Secured at: 22 cm Tube secured with: Tape Dental Injury: Teeth and Oropharynx as per pre-operative assessment

## 2024-10-14 NOTE — Op Note (Signed)
 10/14/2024  2:01 PM  PATIENT:  Logan Lucas  48 y.o. male  PRE-OPERATIVE DIAGNOSIS:  PERFORATED SIGMOID DIVERTICULITIS  POST-OPERATIVE DIAGNOSIS:  SAME  PROCEDURE:  Procedure(s): LAPAROSCOPIC ASSISTED HARTMANN'S PROCEDURE (SIGMOID COLECTOMY WITH END COLOSTOMY) AND LAPAROSCOPIC SPLENIC FLEXURE MOBILIZATION Negative pressure wound therapy (vacuum-assisted drainage collection), utilizing nondisposable durable medical equipment (DME) on an open wound, including topical application, wound assessment, and instructions for ongoing care; total wound surface area greater than or equal to 50 cm  SURGEON:  Surgeon(s): Lucas Locus, MD  ASSISTANTS: Elspeth Schultze, MD   ANESTHESIA:   general  EBL: 125 ml  DRAINS: Urinary Catheter (Foley)   LOCAL MEDICATIONS USED:  MARCAINE      SPECIMEN:  Source of Specimen:  sigmoid colon, stitch distal  DISPOSITION OF SPECIMEN:  PATHOLOGY  COUNTS:  YES  INDICATION FOR PROCEDURE: Patient is a 48 year old male with hypertension, class II obesity, tobacco use, diet-controlled diabetes who has had several admissions since September for sigmoid diverticulitis with perforation and small abscess.  He got better initially with medical treatment with IV antibiotics but he had a recurrence short-term and got readmitted.  Because it was felt to be consistent with a smoldering diverticulitis and he was still having pain we recommended partial colectomy with end colostomy.  He was evaluated and marked by wound care nurse preoperatively.  Risk and benefits were separately discussed and documented  PROCEDURE: The patient had been marked by the wound care nurse preoperatively.  He was on scheduled therapeutic IV antibiotics as well as scheduled DVT prophylaxis of Lovenox .  He was brought to the holding area and evaluated by anesthesia.  After obtaining informed consent he was taken the OR for at [facility] and placed upon on operating table.  General  endotracheal anesthesia was established.  Sequential compression devices were placed.  The right arm was tucked with the appropriate padding.  His abdomen was prepped and draped in the usual standard surgical fashion.  A surgical timeout was performed.  Access to the abdomen was gained in the right upper quadrant using Optiview technique a small incision was made then using a 0 degree 5 mm laparoscope through a 5 mm trocar it was advanced through all layers of the abdominal wall and carefully entered the abdominal cavity.  Pneumoperitoneum was smoothly established without any change in patient vital signs.  The abdominal cavity was surveilled.  There was no evidence of injury to surrounding structures.  A 5 mm trocar was placed just below the umbilicus and another 5 mm trocar in the right lower quadrant all under direct visualization.  The patient had an obvious inflamed section of distal sigmoid colon that was hard and inflamed and densely adhered to the white line of Toldt at the pelvic inlet.  Part of it was also adhered to the anterior abdominal wall.  He appeared to have somewhat of a little bit of redundant sigmoid colon.  There was also a knuckle of small bowel that was tethered and adhered to this inflamed sigmoid colon.  It was gently taken down with EndoShears without electrocautery.  There was no evidence of injury to the small bowel.  I then started taking down to the lateral attachments of the descending colon with harmonic scalpel.  This was taken up the more proximal descending colon toward the splenic flexure.  We were able to sweep the descending colon medial.  I then turned my attention to the transverse colon.  The omentum on the distal transverse colon was  wrapped around to the proximal descending colon this was taken down with harmonic scalpel.  I then took the omentum off the distal transverse colon with harmonic scalpel and marched toward the splenic flexure.  We are able to take down the  splenic flexure attachments with the harmonic scalpel.  This medialized the colon.  I did not think I would gain any more traction laparoscopically given how densely inflamed and stuck the sigmoid colon was to the left pelvic inlet.  A lower midline incision was made with a 10 blade the subcutaneous tissue was divided electrocautery.  The fascia was entered and the abdominal cavity was entered.  A wound protector was placed.  We initially started with a Balfour retractor but ultimately switched to a Bookwalter retractor.  I was able to visualize normal upper rectum and palpated.  There was some of the sigmoid colon twisted and tethered onto itself at the pelvic inlet.  I was able to finger fracture the densely adhered and inflamed sigmoid colon from the white line of Toldt and then the anterior abdominal wall this was done with a combination of blunt dissection with my finger as well as with LigaSure device.  I was able to get around the proximal upper rectum with a contour stapler with a green load.  My partner Dr. Sheldon then joined me to help me with mobilization and assistance given the dense inflammation of the sigmoid colon.  There was some pus that we came across and mobilizing the sigmoid colon.  We started taking down the mesentery sequentially with LigaSure staying close to the colon.  There was some indurated mesentery that was left behind.  It appeared we had come through the abscess cavity.  There was some purulence and pus in this location.  We identified normal palpable proximal colon just above the inflamed sigmoid colon and divided it with a another fire of the contour stapler with a green load.  We then took down some of the proximal mesenteric attachment of the proximal colon so it would come up to the abdominal wall for an end colostomy.  We then irrigated the pelvis there was no evidence of bleeding.  The rectal stump was tagged with 2 separate 2-0 Prolene sutures to mark it for future reference.   I inspected the the small bowel that had been tethered to the sigmoid colon and there was no evidence of serosal injury.  A circular skin defect was made on the left mid abdomen at the level of the umbilicus for the patient had been marked by the wound care nurse.  Subtenons tissue was divided with electrocautery.  The fascia was incised in a cruciate fashion the rectus muscle was spread with a Kelly and the posterior fascia and peritoneum was incised with Bovie electrocautery.  We then brought up the proximal table and entered the colon up through the abdominal wall.  It was not twisted.  There was good blood flow to it.  We then closed the fascia with a #1 looped PDS 1 from below and 1 from above tied centrally.  The subcutaneous tissue was irrigated.  I placed a wound vac on the open wound in the lower midline.   A negative pressure wound VAC (Vacuum Assisted drainage Collection device) using nondisposable durable medical equipment (DME) was placed onto this open wound with a total wound surface area of (15 x 5 cm).  Plastic drape was applied along with a wound VAC connector and we had a good seal.  There was no leak.  I then turned my attention to maturing the colostomy.  The staple line was excised with electrocautery.  I then anchored the colostomy to the deep dermis with multiple interrupted 3-0 Vicryl sutures.  There was good blood flow as evidenced by some bleeding as I was doing this.  But there was good hemostasis at the end.  An ostomy appliance was applied.  All needle, instrument, and sponge counts were correct x 2.  There were no immediate complications.  The patient tolerated the procedure well  CASE DATA: Type of patient?: LDOW CASE (Surgical Hospitalist [facility] Inpatient) Status of Case? URGENT Add On Infection Present At Time Of Surgery (PATOS)?  ABSCESS cavity and pus and purulence    PLAN OF CARE: already inpt  PATIENT DISPOSITION:  PACU - hemodynamically stable.   Delay start of  Pharmacological VTE agent (>24hrs) due to surgical blood loss or risk of bleeding:  no  Logan HERO. Tanda, MD, FACS General, Bariatric, & Minimally Invasive Surgery [facility], [facility]

## 2024-10-14 NOTE — Progress Notes (Signed)
 Patient continues to call out every 10 minutes for pain medication, specifically requesting more Dilaudid  and for the dose to be increased,. This RN educated the patient regarding additional PRN pain medications ordered and how to best approach adequate pain management with those medications. After 5mg  Oxycodone  administered patient responded to this RN that he needed more Dilaudid  yet again, and stated that if the nurses on the floor knew how to administer it correctly, his pain would be controlled. He stated that the Dilaudid  needed to be shot in fast. 10 minutes after Oxycodone  administration, patient calling out again for pain medication. Again, this RN entered pt room and explained that it was too soon for any other pain medication at this time. Charge RN made aware of situation.

## 2024-10-14 NOTE — Discharge Instructions (Signed)
 [facility]      [facility], [place] [phone]  OPEN ABDOMINAL SURGERY: POST OP INSTRUCTIONS  Always review your discharge instruction sheet given to you by the facility where your surgery was performed.  IF YOU HAVE DISABILITY OR FAMILY LEAVE FORMS, YOU MUST BRING THEM TO THE OFFICE FOR PROCESSING.  PLEASE DO NOT GIVE THEM TO YOUR DOCTOR.  A prescription for pain medication may be given to you upon discharge.  Take your pain medication as prescribed, if needed.  If narcotic pain medicine is not needed, then you may take acetaminophen  (Tylenol ) or ibuprofen (Advil) as needed. Take your usually prescribed medications unless otherwise directed. If you need a refill on your pain medication, please contact your pharmacy. They will contact our office to request authorization.  Prescriptions will not be filled after 5pm or on week-ends. You should follow a light diet the first few days after arrival home, such as soup and crackers, pudding, etc.unless your doctor has advised otherwise. A high-fiber, low fat diet can be resumed as tolerated.   Be sure to include lots of fluids daily. Most patients will experience some swelling and bruising on the chest and neck area.  Ice packs will help.  Swelling and bruising can take several days to resolve Most patients will experience some swelling and bruising in the area of the incision. Ice pack will help. Swelling and bruising can take several days to resolve..  It is common to experience some constipation if taking pain medication after surgery.  Increasing fluid intake and taking a stool softener will usually help or prevent this problem from occurring.  A mild laxative (Milk of Magnesia or Miralax) should be taken according to package directions if there are no bowel movements after 48 hours.  You may have steri-strips (small skin tapes) in place directly over the incision.  These strips should be left on the skin for 7-10 days.  If your surgeon used skin  glue on the incision, you may shower in 24 hours.  The glue will flake off over the next 2-3 weeks.  Any sutures or staples will be removed at the office during your follow-up visit. You may find that a light gauze bandage over your incision may keep your staples from being rubbed or pulled. You may shower and replace the bandage daily. ACTIVITIES:  You may resume regular (light) daily activities beginning the next day--such as daily self-care, walking, climbing stairs--gradually increasing activities as tolerated.  You may have sexual intercourse when it is comfortable.  Refrain from any heavy lifting or straining until approved by your doctor. You may drive when you no longer are taking prescription pain medication, you can comfortably wear a seatbelt, and you can safely maneuver your car and apply brakes Return to Work: ___________________________________ Logan Lucas should see your doctor in the office for a follow-up appointment approximately two weeks after your surgery.  Make sure that you call for this appointment within a day or two after you arrive home to insure a convenient appointment time. OTHER INSTRUCTIONS:  _____________________________________________________________ _____________________________________________________________  WHEN TO CALL YOUR DOCTOR: Fever over 101.0 Inability to urinate Nausea and/or vomiting Extreme swelling or bruising Continued bleeding from incision. Increased pain, redness, or drainage from the incision. Difficulty swallowing or breathing Muscle cramping or spasms. Numbness or tingling in hands or feet or around lips.  The clinic staff is available to answer your questions during regular business hours.  Please don't hesitate to call and ask to speak to one of  the nurses if you have concerns.  For further questions, please visit [email or web address]

## 2024-10-14 NOTE — Interval H&P Note (Signed)
 History and Physical Interval Note:  10/14/2024 10:19 AM  Logan Lucas  has presented today for surgery, with the diagnosis of DIVERTICULITIS.  The various methods of treatment have been discussed with the patient and family. After consideration of risks, benefits and other options for treatment, the patient has consented to  Procedure(s): LAPAROSCOPIC PARTIAL COLECTOMY (N/A) CREATION, COLOSTOMY (N/A) as a surgical intervention.  The patient's history has been reviewed, patient examined, no change in status, stable for surgery.  I have reviewed the patient's chart and labs.  Questions were answered to the patient's satisfaction.    Camellia HERO. Tanda, MD, FACS General, Bariatric, & Minimally Invasive Surgery [facility],  A [facility]   Camellia Tanda

## 2024-10-14 NOTE — Anesthesia Postprocedure Evaluation (Signed)
 Anesthesia Post Note  Patient: Logan Lucas  Procedure(s) Performed: LAPAROSCOPIC PARTIAL COLECTOMY AND SPLENIC FLEXURE MOBILIZATION CREATION, COLOSTOMY     Patient location during evaluation: PACU Anesthesia Type: General Level of consciousness: awake and alert Pain management: pain level controlled Vital Signs Assessment: post-procedure vital signs reviewed and stable Respiratory status: spontaneous breathing, nonlabored ventilation, respiratory function stable and patient connected to nasal cannula oxygen Cardiovascular status: blood pressure returned to baseline and stable Postop Assessment: no apparent nausea or vomiting Anesthetic complications: no   No notable events documented.  Last Vitals:  Vitals:   10/14/24 1445 10/14/24 1500  BP: (!) 140/91 126/66  Pulse: 93   Resp: (!) 25 17  Temp:    SpO2: 98%     Last Pain:  Vitals:   10/14/24 1500  TempSrc:   PainSc: 8                  Garnette DELENA Gab

## 2024-10-14 NOTE — Progress Notes (Signed)
 [facility] PROGRESS NOTE    Progress Note  Logan Lucas  FMW:[id number] DOB: December 19, 1975 DOA: 10/10/2024 PCP: Patient, No Pcp Per     Brief Narrative:   Logan Lucas is an 48 y.o. male past medical history significant for essential hypertension, GI bleed, diet-controlled diabetes mellitus type 2, BMI 38 came in with acute complicated diverticulitis with small perforation and small abscess  Assessment/Plan:   Acute recurrent perforated sigmoid Diverticulitis Seen on CT with extraluminal gas and sigmoid diverticulitis negative for abscess. General surgery on board. Currently n.p.o. on empiric IV Zosyn . General surgery recommended to proceed with lap chole and cystic sigmoid colectomy on 10/14/2024.  Intermittent chest pain and dyspnea: Continue IV Protonix  chest x-ray negative.  Elevated blood pressure without a diagnosis of hypertension. Continue to monitor.  Diabetes mellitus type 2 diet controlled: Continue CBGs Q4.  Asthma: Continue butyryl.  Incidental bilateral adrenal nodule seen on CT: Follow-up PCP as an outpatient.  Left kidney complex cyst: Follow-up PCP as an outpatient.  Will need an MRI.   DVT prophylaxis: lovenox  Family Communication:none Status is: Inpatient Remains inpatient appropriate because: Acute perforated diverticulitis    Code Status:     Code Status Orders  (From admission, onward)           Start     Ordered   10/10/24 2338  Full code  Continuous       Question:  By:  Answer:  Consent: discussion documented in EHR   10/10/24 2339           Code Status History     Date Active Date Inactive Code Status Order ID Comments User Context   08/25/2024 0738 08/31/2024 1811 Full Code [id number]  Zella Katha HERO, MD ED   07/31/2022 2310 08/05/2022 0035 Full Code [id number]  Emmy Alf, MD ED   05/24/2022 1640 05/27/2022 2054 Full Code [id number]  Susen Pastor, MD ED   01/23/2021 1549 01/26/2021 1941 Full Code  [id number]  Hilma Rankins, MD ED   10/14/2019 1205 10/17/2019 1734 Full Code [id number]  Tobie Yetta HERO, MD ED   06/17/2018 2210 06/19/2018 1751 Full Code [id number]  Lonzo Pollen, MD Inpatient         IV Access:   Peripheral IV   Procedures and diagnostic studies:   No results found.   Medical Consultants:   None.   Subjective:    Logan Lucas relates he has some abdominal pain.  Objective:    Vitals:   10/13/24 0402 10/13/24 1335 10/13/24 2124 10/14/24 0546  BP: 116/70 129/87 132/73 135/82  Pulse: 81 75 77 77  Resp: 18 18 20 20   Temp: 98.6 F (37 C) 99.5 F (37.5 C) 99.4 F (37.4 C) 98.4 F (36.9 C)  TempSrc: Oral Oral Oral Oral  SpO2: 97% 98% 97% 99%  Weight:      Height:       SpO2: 99 %   Intake/Output Summary (Last 24 hours) at 10/14/2024 0812 Last data filed at 10/14/2024 9347 Gross per 24 hour  Intake 3157.02 ml  Output 3300 ml  Net -142.98 ml   Filed Weights   10/10/24 1843 10/10/24 1903  Weight: 113.4 kg 113.4 kg    Exam: General exam: In no acute distress. Respiratory system: Good air movement and clear to auscultation. Cardiovascular system: S1 & S2 heard, RRR. No JVD. Gastrointestinal system: Abdomen is nondistended, soft and nontender.  Extremities: No pedal edema. Skin: No rashes, lesions or ulcers Psychiatry: Judgement and insight  appear normal. Mood & affect appropriate.    Data Reviewed:    Labs: Basic Metabolic Panel: Recent Labs  Lab 10/10/24 1921 10/11/24 0020 10/12/24 0528 10/13/24 0848 10/14/24 0525  NA 135 135 135 136 137  K 3.9 4.0 4.0 4.2 4.1  CL 102 103 102 101 100  CO2 21* 22 21* 26 27  GLUCOSE 158* 145* 89 127* 106*  BUN 7 6 8 6 6   CREATININE 0.95 0.89 0.87 0.94 1.11  CALCIUM  9.0 8.5* 9.0 9.0 9.3   GFR Estimated Creatinine Clearance: 102.7 mL/min (by C-G formula based on SCr of 1.11 mg/dL). Liver Function Tests: Recent Labs  Lab 10/10/24 1921  AST 16  ALT 14  ALKPHOS 131*  BILITOT 0.4   PROT 7.1  ALBUMIN 3.8   Recent Labs  Lab 10/10/24 1921  LIPASE 23   No results for input(s): AMMONIA in the last 168 hours. Coagulation profile No results for input(s): INR, PROTIME in the last 168 hours. COVID-19 Labs  No results for input(s): DDIMER, FERRITIN, LDH, CRP in the last 72 hours.  Lab Results  Component Value Date   SARSCOV2NAA NEGATIVE 09/18/2023   SARSCOV2NAA NEGATIVE 09/12/2023   SARSCOV2NAA NEGATIVE 02/28/2023   SARSCOV2NAA NEGATIVE 01/23/2021    CBC: Recent Labs  Lab 10/10/24 1921 10/11/24 0742 10/12/24 0528 10/13/24 0848 10/14/24 0525  WBC 15.1* 12.7* 12.5* 10.5 11.0*  HGB 14.3 13.1 13.2 13.6 13.4  HCT 44.2 41.4 41.6 42.6 41.9  MCV 84.4 85.7 86.1 85.4 85.0  PLT 298 264 270 270 301   Cardiac Enzymes: No results for input(s): CKTOTAL, CKMB, CKMBINDEX, TROPONINI in the last 168 hours. BNP (last 3 results) No results for input(s): PROBNP in the last 8760 hours. CBG: No results for input(s): GLUCAP in the last 168 hours. D-Dimer: No results for input(s): DDIMER in the last 72 hours. Hgb A1c: No results for input(s): HGBA1C in the last 72 hours. Lipid Profile: No results for input(s): CHOL, HDL, LDLCALC, TRIG, CHOLHDL, LDLDIRECT in the last 72 hours. Thyroid function studies: No results for input(s): TSH, T4TOTAL, T3FREE, THYROIDAB in the last 72 hours.  Invalid input(s): FREET3 Anemia work up: No results for input(s): VITAMINB12, FOLATE, FERRITIN, TIBC, IRON, RETICCTPCT in the last 72 hours. Sepsis Labs: Recent Labs  Lab 10/11/24 0014 10/11/24 0742 10/12/24 0528 10/13/24 0848 10/14/24 0525  WBC  --  12.7* 12.5* 10.5 11.0*  LATICACIDVEN 0.8  --   --   --   --    Microbiology No results found for this or any previous visit (from the past 240 hours).   Medications:    acetaminophen   1,000 mg Oral Q6H   enoxaparin  (LOVENOX ) injection  55 mg Subcutaneous Q24H   feeding  supplement  1 Container Oral BID BM   feeding supplement  1 Container Oral BID BM   influenza vac split trivalent PF  0.5 mL Intramuscular Tomorrow-1000   melatonin  3 mg Oral QHS   nicotine   21 mg Transdermal Daily   pantoprazole  (PROTONIX ) IV  40 mg Intravenous Q12H   pneumococcal 20-valent conjugate vaccine  0.5 mL Intramuscular Tomorrow-1000   Continuous Infusions:  lactated ringers  75 mL/hr at 10/14/24 9347   piperacillin -tazobactam (ZOSYN )  IV 3.375 g (10/14/24 0549)      LOS: 4 days   Erle Odell Castor  [facility]  10/14/2024, 8:12 AM

## 2024-10-15 ENCOUNTER — Encounter (HOSPITAL_COMMUNITY): Payer: Self-pay | Admitting: General Surgery

## 2024-10-15 DIAGNOSIS — K5792 Diverticulitis of intestine, part unspecified, without perforation or abscess without bleeding: Secondary | ICD-10-CM | POA: Diagnosis not present

## 2024-10-15 LAB — SURGICAL PATHOLOGY

## 2024-10-15 LAB — BASIC METABOLIC PANEL WITH GFR
Anion gap: 11 (ref 5–15)
BUN: 7 mg/dL (ref 6–20)
CO2: 22 mmol/L (ref 22–32)
Calcium: 8.9 mg/dL (ref 8.9–10.3)
Chloride: 102 mmol/L (ref 98–111)
Creatinine, Ser: 0.86 mg/dL (ref 0.61–1.24)
GFR, Estimated: >60 mL/min (ref >60)
Glucose, Bld: 137 mg/dL — ABNORMAL HIGH (ref 70–99)
Potassium: 4 mmol/L (ref 3.5–5.1)
Sodium: 135 mmol/L (ref 135–145)

## 2024-10-15 LAB — CBC
HCT: 42.2 % (ref 39.0–52.0)
Hemoglobin: 13.5 g/dL (ref 13.0–17.0)
MCH: 27.3 pg (ref 26.0–34.0)
MCHC: 32 g/dL (ref 30.0–36.0)
MCV: 85.3 fL (ref 80.0–100.0)
Platelets: 342 K/uL (ref 150–400)
RBC: 4.95 MIL/uL (ref 4.22–5.81)
RDW: 13.9 % (ref 11.5–15.5)
WBC: 12.1 K/uL — ABNORMAL HIGH (ref 4.0–10.5)
nRBC: 0 % (ref 0.0–0.2)

## 2024-10-15 MED ORDER — METHOCARBAMOL 500 MG PO TABS
500.0000 mg | ORAL_TABLET | Freq: Four times a day (QID) | ORAL | Status: DC
  Administered 2024-10-15 – 2024-10-27 (×48): 500 mg via ORAL
  Filled 2024-10-15 (×48): qty 1

## 2024-10-15 NOTE — Progress Notes (Signed)
 [facility] PROGRESS NOTE    Progress Note  Logan Lucas  FMW:[id number] DOB: 26-May-1976 DOA: 10/10/2024 PCP: Patient, No Pcp Per     Brief Narrative:   Logan Lucas is an 48 y.o. male past medical history significant for essential hypertension, GI bleed, diet-controlled diabetes mellitus type 2, BMI 38 came in with acute complicated diverticulitis with small perforation and small abscess  Assessment/Plan:   Acute recurrent perforated sigmoid Diverticulitis Seen on CT with extraluminal gas and sigmoid diverticulitis negative for abscess. General surgery on board status post right surgical intervention for perforated sigmoid diverticulitis/sigmoid colostomy on 10/14/2024. Currently n.p.o. on empiric IV Zosyn .  Intermittent chest pain and dyspnea: Continue IV Protonix  chest x-ray negative. No evidence of ischemia.  Elevated blood pressure without a diagnosis of hypertension. Continue to monitor.  Diabetes mellitus type 2 diet controlled: Continue CBGs Q4.  Asthma: Continue butyryl.  Incidental bilateral adrenal nodule seen on CT: Follow-up PCP as an outpatient.  Left kidney complex cyst: Follow-up PCP as an outpatient.  Will need an MRI as an outpatient.   DVT prophylaxis: lovenox  Family Communication:none Status is: Inpatient Remains inpatient appropriate because: Acute perforated diverticulitis    Code Status:     Code Status Orders  (From admission, onward)           Start     Ordered   10/10/24 2338  Full code  Continuous       Question:  By:  Answer:  Consent: discussion documented in EHR   10/10/24 2339           Code Status History     Date Active Date Inactive Code Status Order ID Comments User Context   08/25/2024 0738 08/31/2024 1811 Full Code [id number]  Zella Katha HERO, MD ED   07/31/2022 2310 08/05/2022 0035 Full Code [id number]  Emmy Alf, MD ED   05/24/2022 1640 05/27/2022 2054 Full Code [id number]  Susen Pastor, MD ED   01/23/2021 1549 01/26/2021 1941 Full Code [id number]  Hilma Rankins, MD ED   10/14/2019 1205 10/17/2019 1734 Full Code [id number]  Tobie Yetta HERO, MD ED   06/17/2018 2210 06/19/2018 1751 Full Code [id number]  Lonzo Pollen, MD Inpatient         IV Access:   Peripheral IV   Procedures and diagnostic studies:   No results found.   Medical Consultants:   None.   Subjective:    Logan Lucas release narcotics are controlling his abdominal pain.  No appetite.  Has not had a bowel movement or passing gas  Objective:    Vitals:   10/14/24 1551 10/14/24 2005 10/14/24 2345 10/15/24 0401  BP: (!) 153/97 (!) 146/91 (!) 147/90 (!) 153/86  Pulse: 84 87 79 84  Resp: 18 18 20 18   Temp: 98.2 F (36.8 C) 98.7 F (37.1 C) 98.6 F (37 C) 98.3 F (36.8 C)  TempSrc: Oral Oral Oral Oral  SpO2: 95% 98% 95% 96%  Weight:      Height:       SpO2: 96 % O2 Flow Rate (L/min): 2 L/min   Intake/Output Summary (Last 24 hours) at 10/15/2024 0820 Last data filed at 10/15/2024 0405 Gross per 24 hour  Intake 1492.22 ml  Output 1560 ml  Net -67.78 ml   Filed Weights   10/10/24 1843 10/10/24 1903 10/14/24 0910  Weight: 113.4 kg 113.4 kg 113.4 kg    Exam: General exam: In no acute distress. Respiratory system: Good air movement and clear to  auscultation. Cardiovascular system: S1 & S2 heard, RRR. No JVD. Gastrointestinal system: Negative bowel sounds abdomen is distended wound VAC in place colostomy and right lower quadrant, no bowel movements in bag Extremities: No pedal edema. Skin: No rashes, lesions or ulcers Psychiatry: Judgement and insight appear normal. Mood & affect appropriate.  Data Reviewed:    Labs: Basic Metabolic Panel: Recent Labs  Lab 10/11/24 0020 10/12/24 0528 10/13/24 0848 10/14/24 0525 10/15/24 0555  NA 135 135 136 137 135  K 4.0 4.0 4.2 4.1 4.0  CL 103 102 101 100 102  CO2 22 21* 26 27 22   GLUCOSE 145* 89 127* 106* 137*  BUN 6 8 6  6 7   CREATININE 0.89 0.87 0.94 1.11 0.86  CALCIUM  8.5* 9.0 9.0 9.3 8.9   GFR Estimated Creatinine Clearance: 132.6 mL/min (by C-G formula based on SCr of 0.86 mg/dL). Liver Function Tests: Recent Labs  Lab 10/10/24 1921  AST 16  ALT 14  ALKPHOS 131*  BILITOT 0.4  PROT 7.1  ALBUMIN 3.8   Recent Labs  Lab 10/10/24 1921  LIPASE 23   No results for input(s): AMMONIA in the last 168 hours. Coagulation profile No results for input(s): INR, PROTIME in the last 168 hours. COVID-19 Labs  No results for input(s): DDIMER, FERRITIN, LDH, CRP in the last 72 hours.  Lab Results  Component Value Date   SARSCOV2NAA NEGATIVE 09/18/2023   SARSCOV2NAA NEGATIVE 09/12/2023   SARSCOV2NAA NEGATIVE 02/28/2023   SARSCOV2NAA NEGATIVE 01/23/2021    CBC: Recent Labs  Lab 10/11/24 0742 10/12/24 0528 10/13/24 0848 10/14/24 0525 10/15/24 0555  WBC 12.7* 12.5* 10.5 11.0* 12.1*  HGB 13.1 13.2 13.6 13.4 13.5  HCT 41.4 41.6 42.6 41.9 42.2  MCV 85.7 86.1 85.4 85.0 85.3  PLT 264 270 270 301 342   Cardiac Enzymes: No results for input(s): CKTOTAL, CKMB, CKMBINDEX, TROPONINI in the last 168 hours. BNP (last 3 results) No results for input(s): PROBNP in the last 8760 hours. CBG: Recent Labs  Lab 10/14/24 1507  GLUCAP 179*   D-Dimer: No results for input(s): DDIMER in the last 72 hours. Hgb A1c: No results for input(s): HGBA1C in the last 72 hours. Lipid Profile: No results for input(s): CHOL, HDL, LDLCALC, TRIG, CHOLHDL, LDLDIRECT in the last 72 hours. Thyroid function studies: No results for input(s): TSH, T4TOTAL, T3FREE, THYROIDAB in the last 72 hours.  Invalid input(s): FREET3 Anemia work up: No results for input(s): VITAMINB12, FOLATE, FERRITIN, TIBC, IRON, RETICCTPCT in the last 72 hours. Sepsis Labs: Recent Labs  Lab 10/11/24 0014 10/11/24 0742 10/12/24 0528 10/13/24 0848 10/14/24 0525 10/15/24 0555  WBC   --    < > 12.5* 10.5 11.0* 12.1*  LATICACIDVEN 0.8  --   --   --   --   --    < > = values in this interval not displayed.   Microbiology Recent Results (from the past 240 hours)  Surgical pcr screen     Status: None   Collection Time: 10/14/24  8:09 AM   Specimen: Nasal Mucosa; Nasal Swab  Result Value Ref Range Status   MRSA, PCR NEGATIVE NEGATIVE Final   Staphylococcus aureus NEGATIVE NEGATIVE Final    Comment: (NOTE) The Xpert SA Assay (FDA approved for NASAL specimens in patients 49 years of age and older), is one component of a comprehensive surveillance program. It is not intended to diagnose infection nor to guide or monitor treatment. Performed at [facility], [address] [place],  [place] [address]      Medications:    acetaminophen   1,000 mg Oral Q6H   Chlorhexidine Gluconate Cloth  6 each Topical Daily   enoxaparin  (LOVENOX ) injection  55 mg Subcutaneous Q24H   influenza vac split trivalent PF  0.5 mL Intramuscular Tomorrow-1000   melatonin  3 mg Oral QHS   nicotine   21 mg Transdermal Daily   pantoprazole  (PROTONIX ) IV  40 mg Intravenous Q24H   Continuous Infusions:  piperacillin -tazobactam (ZOSYN )  IV 3.375 g (10/15/24 0500)      LOS: 5 days   Logan Lucas  [facility]  10/15/2024, 8:20 AM

## 2024-10-15 NOTE — Consult Note (Signed)
 Reason for consult; Patient with new colostomy; Has wound vac    WOC Nurse ostomy consult note Stoma type/location: LLQ, end colostomy  Stomal assessment/size: pink, moist, slightly budded, visualized through pouch Peristomal assessment: NA Treatment options for stomal/peristomal skin: NA Output none Ostomy pouching: 2pc.  Education provided:  met with patient, he has been having issues with pain management, and reports that he needs to take a nap when I arrive to the room. When I attempt to assess his surgical site and ostomy he is guarded and pushing my hand away due to my muscles all tighten up if I move.   I was able to leave educational materials, explain our role.  I told him that we would see him again tomorrow for NPWT dressing change and pouch change. The patient lives alone but reports he has a friend and a sister, not sure if they will be involved in care. Would plan to assist patient to be independent.  Enrolled patient in [place] Secure Start DC program: Yes,   WOC nursing will plan to see patient Friday for NPWT dressing changes and Tue/Fri beginning next week.   Daralyn Bert [facility], CNS, CWON-AP [phone]

## 2024-10-15 NOTE — Progress Notes (Signed)
 Patient ID: Logan Lucas, male   DOB: 1976-07-17, 48 y.o.   MRN: [id number]   Acute Care Surgery Service Progress Note:    Chief Complaint/Subjective: No nausea or vomiting.  Tolerating clears.  No air in bag Complaining of abdominal spasms.  Has not been out of bed yet today  Objective: Vital signs in last 24 hours: Temp:  [97 F (36.1 C)-99 F (37.2 C)] 98.3 F (36.8 C) (11/20 0401) Pulse Rate:  [79-96] 84 (11/20 0401) Resp:  [6-26] 18 (11/20 0401) BP: (126-153)/(66-97) 153/86 (11/20 0401) SpO2:  [95 %-100 %] 96 % (11/20 0401) Last BM Date : 10/14/24 (NEW Colostomy - bloody drainage from OR)  Intake/Output from previous day: 11/19 0701 - 11/20 0700 In: 1492.2 [P.O.:80; I.V.:1400; IV Piggyback:12.2] Out: 1560 [Urine:1475; Stool:10; Blood:75] Intake/Output this shift: No intake/output data recorded.  Lungs nonlabored  Cardiovascular: reg  Abd: Soft, obese, wound VAC intact, colostomy appears viable.  There is stoma sweat in the bag but no air or stool.  Appropriate tenderness  Extremities: no edema, +SCDs  Neuro: alert, nonfocal  Lab Results: CBC  Recent Labs    10/14/24 0525 10/15/24 0555  WBC 11.0* 12.1*  HGB 13.4 13.5  HCT 41.9 42.2  PLT 301 342   BMET Recent Labs    10/14/24 0525 10/15/24 0555  NA 137 135  K 4.1 4.0  CL 100 102  CO2 27 22  GLUCOSE 106* 137*  BUN 6 7  CREATININE 1.11 0.86  CALCIUM  9.3 8.9   LFT    Latest Ref Rng & Units 10/10/2024    7:21 PM 08/25/2024    3:23 AM 08/13/2024    8:00 PM  Hepatic Function  Total Protein 6.5 - 8.1 g/dL 7.1  7.1  6.3   Albumin 3.5 - 5.0 g/dL 3.8  3.8  3.5   AST 15 - 41 U/L 16  12  14    ALT 0 - 44 U/L 14  10  14    Alk Phosphatase 38 - 126 U/L 131  143  88   Total Bilirubin 0.0 - 1.2 mg/dL 0.4  0.7  0.8    PT/INR No results for input(s): LABPROT, INR in the last 72 hours. ABG No results for input(s): PHART, HCO3 in the last 72 hours.  Invalid input(s): PCO2,  PO2  Studies/Results:  Anti-infectives: Anti-infectives (From admission, onward)    Start     Dose/Rate Route Frequency Ordered Stop   10/14/24 2200  piperacillin -tazobactam (ZOSYN ) IVPB 3.375 g        3.375 g 12.5 mL/hr over 240 Minutes Intravenous Every 8 hours 10/14/24 1510 10/15/24 2159   10/11/24 0600  piperacillin -tazobactam (ZOSYN ) IVPB 3.375 g  Status:  Discontinued        3.375 g 12.5 mL/hr over 240 Minutes Intravenous Every 8 hours 10/10/24 2350 10/14/24 1510   10/10/24 2300  piperacillin -tazobactam (ZOSYN ) IVPB 3.375 g        3.375 g 100 mL/hr over 30 Minutes Intravenous  Once 10/10/24 2252 10/10/24 2349       Medications: Scheduled Meds:  acetaminophen   1,000 mg Oral Q6H   Chlorhexidine Gluconate Cloth  6 each Topical Daily   enoxaparin  (LOVENOX ) injection  55 mg Subcutaneous Q24H   influenza vac split trivalent PF  0.5 mL Intramuscular Tomorrow-1000   melatonin  3 mg Oral QHS   methocarbamol  500 mg Oral QID   nicotine   21 mg Transdermal Daily   pantoprazole  (PROTONIX ) IV  40 mg Intravenous Q24H  Continuous Infusions:  piperacillin -tazobactam (ZOSYN )  IV 3.375 g (10/15/24 0500)   PRN Meds:.albuterol , HYDROmorphone  (DILAUDID ) injection, naLOXone (NARCAN)  injection, ondansetron  (ZOFRAN ) IV, mouth rinse, oxyCODONE   Assessment/Plan: Patient Active Problem List   Diagnosis Date Noted   Diverticulitis 10/10/2024   Diet-controlled diabetes mellitus (HCC) 10/10/2024   Diverticulitis of both large and small intestine with perforation with bleeding 08/25/2024   Acute diverticulitis 07/31/2022   Hypertriglyceridemia 06/05/2022   Anxiety 06/05/2022   Severe hyperglycemia due to diabetes mellitus (HCC) 05/24/2022   Cocaine abuse (HCC) 01/25/2021   Uncontrolled type 2 diabetes mellitus with hyperglycemia (HCC) 01/25/2021   Abnormal stress test    Alcohol use 01/23/2021   Rectal bleeding 01/23/2021   Asthma, chronic    Essential hypertension    Tobacco abuse     Duodenitis 10/14/2019   Chest pain 06/17/2018   s/p Procedure(s):   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  - hgb stable -wound vac to open lower midline wound -WOC consult for new colostomy -OOB, IS    FEN - FLD, IVF VTE - Lovenox  ID - Zosyn  x 24 hrs    HTN DM asthma Disposition:  LOS: 5 days    Janie Strothman M. Tanda, MD, FACS General, Bariatric, & Minimally Invasive Surgery [phone] [facility]

## 2024-10-15 NOTE — Progress Notes (Signed)
 Patient was educated on the benefits of ambulation and risks of remaining immobile,and delayed recovery.Patient verbalized understanding but continued to refuse ambulation,stating he is hurting so bad.Will continue to encourage mobility and reassess.

## 2024-10-16 DIAGNOSIS — K5792 Diverticulitis of intestine, part unspecified, without perforation or abscess without bleeding: Secondary | ICD-10-CM | POA: Diagnosis not present

## 2024-10-16 MED ORDER — KETOROLAC TROMETHAMINE 15 MG/ML IJ SOLN
15.0000 mg | Freq: Three times a day (TID) | INTRAMUSCULAR | Status: AC
Start: 2024-10-16 — End: 2024-10-18
  Administered 2024-10-16 – 2024-10-18 (×6): 15 mg via INTRAVENOUS
  Filled 2024-10-16 (×6): qty 1

## 2024-10-16 MED ORDER — HYDROMORPHONE HCL 1 MG/ML IJ SOLN
1.0000 mg | INTRAMUSCULAR | Status: DC | PRN
Start: 2024-10-16 — End: 2024-10-17
  Administered 2024-10-16 – 2024-10-17 (×6): 1 mg via INTRAVENOUS
  Filled 2024-10-16 (×6): qty 1

## 2024-10-16 MED ORDER — ENSURE PLUS HIGH PROTEIN PO LIQD
237.0000 mL | Freq: Two times a day (BID) | ORAL | Status: DC
  Administered 2024-10-16 – 2024-10-26 (×16): 237 mL via ORAL

## 2024-10-16 MED ORDER — SODIUM CHLORIDE 0.9 % IV SOLN: INTRAVENOUS | Status: DC

## 2024-10-16 NOTE — Plan of Care (Signed)

## 2024-10-16 NOTE — TOC Progression Note (Signed)
 Transition of Care [facility]) - Progression Note    Patient Details  Name: MALEEK CRAVER MRN: [id number] Date of Birth: 31-Jul-1976  Transition of Care [facility]) CM/SW Contact  Nivea Wojdyla, Nathanel, RN Phone Number: 10/16/2024, 4:07 PM  Clinical Narrative:  Wound vac in place-left voice mail for tracy w/[facility] if able to assess for wound vac-await outcome. Sent info to [facility] agencies for acceptance-await [facility] agency to accept  for [facility].    Expected Discharge Plan: Home w Home Health Services Barriers to Discharge: Continued Medical Work up               Expected Discharge Plan and Services   Discharge Planning Services: CM Consult   Living arrangements for the past 2 months: Single Family Home                                       Social Drivers of Health (SDOH) Interventions SDOH Screenings   Food Insecurity: No Food Insecurity (10/11/2024)  Housing: Low Risk  (10/11/2024)  Transportation Needs: No Transportation Needs (10/11/2024)  Utilities: Not At Risk (10/11/2024)  Depression (PHQ2-9): Low Risk  (06/05/2022)  Tobacco Use: High Risk (10/14/2024)    Readmission Risk Interventions    08/03/2022    4:22 PM  Readmission Risk Prevention Plan  Post Dischage Appt Complete  Medication Screening Complete  Transportation Screening Complete

## 2024-10-16 NOTE — Consult Note (Signed)
 WOC Nurse Consult Note: Reason for Consult: VAC to mid-abd  Wound type: Surgical open wound Measurement: 13 x 4 x 5 cm Wound bed: red, moist Drainage no malodor, serosanguinous fluid Periwound: moist; intact Continue with VAC therapy  Removed old NPWT dressing and one complete piece as removed easily, wound base visualized  Cleansed wound with normal saline x 2 Filled wound with  __0_ piece of Mepitel, ___0_ piece of white foam, __01__ piece of black foam  Sealed NPWT dressing at Hg neg continuous pressure Patient received both IV/PO pain medication per bedside nurse prior to after dressing was done. Patient tolerated procedure well once wound procedure was completed.  WOC nurse will continue to provide NPWT dressing changed due to the complexity of the dressing change.     WOC Nurse ostomy follow up Stoma type/location:  Left lower abd Stomal assessment/size: 37 mm, red, viable, moist, flat to skin Peristomal assessment: intact Treatment options for stomal/peristomal skin: barrier ring Output 75 ml bloody effluent Ostomy pouching: 2-pc. 2 1/4  Education provided: Due to patient being in a fair degree of pain with first VAC dressing change patient had a difficult time focusing on ostomy appliance instructions and education.  Enrolled patient in [place] Secure Start Discharge program: Yes   Please reconsult if wound worsens in condition and notify provider. WOC to see patient MWF next week due to holiday schedule.   If an alarm condition cannot be resolved after attempting resolutions listed in the troubleshooting guide, then contact [facility] at [phone]. Promptly notify the treating physician to obtain orders for their desired alternative dressing if the negative-therapy unit is going to be turned off or has been malfunctioning for 2 hours or more.   Sherrilyn Hals MSN RN CWOCN WOC [facility]  [phone] (Available from 7-3 pm Mon-Friday)

## 2024-10-16 NOTE — Progress Notes (Signed)
 Patient ID: Logan Lucas, male   DOB: Apr 15, 1976, 48 y.o.   MRN: [id number]   Acute Care Surgery Service Progress Note:    Chief Complaint/Subjective: No nausea or vomiting.  Tolerating fulls but burping some.  No air in bag still  Has not been out of bed since surgery Foley out  WOC saw him this am and did WV change and ostomy change  Objective: Vital signs in last 24 hours: Temp:  [98.4 F (36.9 C)-99.1 F (37.3 C)] 99.1 F (37.3 C) (11/21 0512) Pulse Rate:  [73-87] 73 (11/21 0512) Resp:  [16-20] 20 (11/21 0512) BP: (140-151)/(84-91) 140/84 (11/21 0512) SpO2:  [96 %-100 %] 97 % (11/21 0512) Last BM Date : 10/14/24 (NEW Colostomy - bloody drainage from OR)  Intake/Output from previous day: 11/20 0701 - 11/21 0700 In: -  Out: 2000 [Urine:2000] Intake/Output this shift: No intake/output data recorded.  Lungs nonlabored  Cardiovascular: reg  Abd: Soft, obese, wound VAC intact, colostomy appears viable.  There is stoma sweat in the bag but no air or stool.  Appropriate tenderness  Extremities: no edema, +SCDs  Neuro: alert, nonfocal  Lab Results: CBC  Recent Labs    10/14/24 0525 10/15/24 0555  WBC 11.0* 12.1*  HGB 13.4 13.5  HCT 41.9 42.2  PLT 301 342   BMET Recent Labs    10/14/24 0525 10/15/24 0555  NA 137 135  K 4.1 4.0  CL 100 102  CO2 27 22  GLUCOSE 106* 137*  BUN 6 7  CREATININE 1.11 0.86  CALCIUM  9.3 8.9   LFT    Latest Ref Rng & Units 10/10/2024    7:21 PM 08/25/2024    3:23 AM 08/13/2024    8:00 PM  Hepatic Function  Total Protein 6.5 - 8.1 g/dL 7.1  7.1  6.3   Albumin 3.5 - 5.0 g/dL 3.8  3.8  3.5   AST 15 - 41 U/L 16  12  14    ALT 0 - 44 U/L 14  10  14    Alk Phosphatase 38 - 126 U/L 131  143  88   Total Bilirubin 0.0 - 1.2 mg/dL 0.4  0.7  0.8    PT/INR No results for input(s): LABPROT, INR in the last 72 hours. ABG No results for input(s): PHART, HCO3 in the last 72 hours.  Invalid input(s): PCO2,  PO2  Studies/Results:  Anti-infectives: Anti-infectives (From admission, onward)    Start     Dose/Rate Route Frequency Ordered Stop   10/14/24 2200  piperacillin -tazobactam (ZOSYN ) IVPB 3.375 g        3.375 g 12.5 mL/hr over 240 Minutes Intravenous Every 8 hours 10/14/24 1510 10/15/24 1739   10/11/24 0600  piperacillin -tazobactam (ZOSYN ) IVPB 3.375 g  Status:  Discontinued        3.375 g 12.5 mL/hr over 240 Minutes Intravenous Every 8 hours 10/10/24 2350 10/14/24 1510   10/10/24 2300  piperacillin -tazobactam (ZOSYN ) IVPB 3.375 g        3.375 g 100 mL/hr over 30 Minutes Intravenous  Once 10/10/24 2252 10/10/24 2349       Medications: Scheduled Meds:  acetaminophen   1,000 mg Oral Q6H   Chlorhexidine  Gluconate Cloth  6 each Topical Daily   enoxaparin  (LOVENOX ) injection  55 mg Subcutaneous Q24H   feeding supplement  237 mL Oral BID BM   influenza vac split trivalent PF  0.5 mL Intramuscular Tomorrow-1000   ketorolac   15 mg Intravenous Q8H   melatonin  3 mg  Oral QHS   methocarbamol   500 mg Oral QID   nicotine   21 mg Transdermal Daily   pantoprazole  (PROTONIX ) IV  40 mg Intravenous Q24H   Continuous Infusions:  sodium chloride      PRN Meds:.albuterol , HYDROmorphone  (DILAUDID ) injection, naLOXone  (NARCAN )  injection, ondansetron  (ZOFRAN ) IV, mouth rinse, oxyCODONE   Assessment/Plan: Patient Active Problem List   Diagnosis Date Noted   Diverticulitis 10/10/2024   Diet-controlled diabetes mellitus (HCC) 10/10/2024   Diverticulitis of both large and small intestine with perforation with bleeding 08/25/2024   Acute diverticulitis 07/31/2022   Hypertriglyceridemia 06/05/2022   Anxiety 06/05/2022   Severe hyperglycemia due to diabetes mellitus (HCC) 05/24/2022   Cocaine abuse (HCC) 01/25/2021   Uncontrolled type 2 diabetes mellitus with hyperglycemia (HCC) 01/25/2021   Abnormal stress test    Alcohol use 01/23/2021   Rectal bleeding 01/23/2021   Asthma, chronic     Essential hypertension    Tobacco abuse    Duodenitis 10/14/2019   Chest pain 06/17/2018   s/p Procedure(s):   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  - hgb stable -wound vac to open lower midline wound -WOC consult for new colostomy -OOB, IS-discussed importance of ambulation -added toradol  11/21    FEN - FLD- will keep at FLD until signs of bowel function, renew IVF VTE - Lovenox  ID - Zosyn  x 24 hrs    HTN DM asthma Disposition: ambulate, await return of bowel function; TOC consult for wound vac   LOS: 6 days    Camellia HERO. Tanda, MD, FACS General, Bariatric, & Minimally Invasive Surgery [phone] [facility]

## 2024-10-16 NOTE — Progress Notes (Signed)
 [facility] PROGRESS NOTE    Progress Note  Logan Lucas  FMW:[id number] DOB: 1975/12/07 DOA: 10/10/2024 PCP: Patient, No Pcp Per     Brief Narrative:   Logan Lucas is an 48 y.o. male past medical history significant for essential hypertension, GI bleed, diet-controlled diabetes mellitus type 2, BMI 38 came in with acute complicated diverticulitis with small perforation and small abscess  Assessment/Plan:   Acute recurrent perforated sigmoid Diverticulitis Seen on CT with extraluminal gas and sigmoid diverticulitis negative for abscess. General surgery on board status post right surgical intervention for perforated sigmoid diverticulitis/sigmoid colostomy on 10/14/2024. Currently on a clear liquid diet and IV Zosyn . Further management per surgery. Start spacing out narcotics.  Has not had a bowel movement. Tolerating his liquids no vomiting.  Intermittent chest pain and dyspnea: Continue IV Protonix , chest x-ray negative. No evidence of ischemia.  Elevated blood pressure without a diagnosis of hypertension. Continue to monitor.  Diabetes mellitus type 2 diet controlled: Continue CBGs Q4.  Asthma: Continue butyryl.  Incidental bilateral adrenal nodule seen on CT: Follow-up PCP as an outpatient.  Left kidney complex cyst: Follow-up PCP as an outpatient.  Will need an MRI as an outpatient.   DVT prophylaxis: lovenox  Family Communication:none Status is: Inpatient Remains inpatient appropriate because: Acute perforated diverticulitis    Code Status:     Code Status Orders  (From admission, onward)           Start     Ordered   10/10/24 2338  Full code  Continuous       Question:  By:  Answer:  Consent: discussion documented in EHR   10/10/24 2339           Code Status History     Date Active Date Inactive Code Status Order ID Comments User Context   08/25/2024 0738 08/31/2024 1811 Full Code [id number]  Zella Katha HERO, MD ED    07/31/2022 2310 08/05/2022 0035 Full Code [id number]  Emmy Alf, MD ED   05/24/2022 1640 05/27/2022 2054 Full Code [id number]  Susen Pastor, MD ED   01/23/2021 1549 01/26/2021 1941 Full Code [id number]  Hilma Rankins, MD ED   10/14/2019 1205 10/17/2019 1734 Full Code [id number]  Tobie Yetta HERO, MD ED   06/17/2018 2210 06/19/2018 1751 Full Code [id number]  Lonzo Pollen, MD Inpatient         IV Access:   Peripheral IV   Procedures and diagnostic studies:   No results found.   Medical Consultants:   None.   Subjective:    Logan Lucas pain is controlled  Objective:    Vitals:   10/15/24 0401 10/15/24 1351 10/15/24 2057 10/16/24 0512  BP: (!) 153/86 (!) 151/91 (!) 144/90 (!) 140/84  Pulse: 84 78 87 73  Resp: 18 16 20 20   Temp: 98.3 F (36.8 C) 98.4 F (36.9 C) 98.4 F (36.9 C) 99.1 F (37.3 C)  TempSrc: Oral Oral Oral Oral  SpO2: 96% 100% 96% 97%  Weight:      Height:       SpO2: 97 % O2 Flow Rate (L/min): 2 L/min   Intake/Output Summary (Last 24 hours) at 10/16/2024 0734 Last data filed at 10/16/2024 0520 Gross per 24 hour  Intake --  Output 2000 ml  Net -2000 ml   Filed Weights   10/10/24 1843 10/10/24 1903 10/14/24 0910  Weight: 113.4 kg 113.4 kg 113.4 kg    Exam: General exam: In no acute distress. Respiratory system: Good  air movement and clear to auscultation. Cardiovascular system: S1 & S2 heard, RRR. No JVD. Gastrointestinal system: Wound VAC in place, soft nontender abdomen. Extremities: No pedal edema. Skin: No rashes, lesions or ulcers Psychiatry: Judgement and insight appear normal. Mood & affect appropriate.    Data Reviewed:    Labs: Basic Metabolic Panel: Recent Labs  Lab 10/11/24 0020 10/12/24 0528 10/13/24 0848 10/14/24 0525 10/15/24 0555  NA 135 135 136 137 135  K 4.0 4.0 4.2 4.1 4.0  CL 103 102 101 100 102  CO2 22 21* 26 27 22   GLUCOSE 145* 89 127* 106* 137*  BUN 6 8 6 6 7   CREATININE 0.89 0.87 0.94 1.11 0.86   CALCIUM  8.5* 9.0 9.0 9.3 8.9   GFR Estimated Creatinine Clearance: 132.6 mL/min (by C-G formula based on SCr of 0.86 mg/dL). Liver Function Tests: Recent Labs  Lab 10/10/24 1921  AST 16  ALT 14  ALKPHOS 131*  BILITOT 0.4  PROT 7.1  ALBUMIN 3.8   Recent Labs  Lab 10/10/24 1921  LIPASE 23   No results for input(s): AMMONIA in the last 168 hours. Coagulation profile No results for input(s): INR, PROTIME in the last 168 hours. COVID-19 Labs  No results for input(s): DDIMER, FERRITIN, LDH, CRP in the last 72 hours.  Lab Results  Component Value Date   SARSCOV2NAA NEGATIVE 09/18/2023   SARSCOV2NAA NEGATIVE 09/12/2023   SARSCOV2NAA NEGATIVE 02/28/2023   SARSCOV2NAA NEGATIVE 01/23/2021    CBC: Recent Labs  Lab 10/11/24 0742 10/12/24 0528 10/13/24 0848 10/14/24 0525 10/15/24 0555  WBC 12.7* 12.5* 10.5 11.0* 12.1*  HGB 13.1 13.2 13.6 13.4 13.5  HCT 41.4 41.6 42.6 41.9 42.2  MCV 85.7 86.1 85.4 85.0 85.3  PLT 264 270 270 301 342   Cardiac Enzymes: No results for input(s): CKTOTAL, CKMB, CKMBINDEX, TROPONINI in the last 168 hours. BNP (last 3 results) No results for input(s): PROBNP in the last 8760 hours. CBG: Recent Labs  Lab 10/14/24 1507  GLUCAP 179*   D-Dimer: No results for input(s): DDIMER in the last 72 hours. Hgb A1c: No results for input(s): HGBA1C in the last 72 hours. Lipid Profile: No results for input(s): CHOL, HDL, LDLCALC, TRIG, CHOLHDL, LDLDIRECT in the last 72 hours. Thyroid function studies: No results for input(s): TSH, T4TOTAL, T3FREE, THYROIDAB in the last 72 hours.  Invalid input(s): FREET3 Anemia work up: No results for input(s): VITAMINB12, FOLATE, FERRITIN, TIBC, IRON, RETICCTPCT in the last 72 hours. Sepsis Labs: Recent Labs  Lab 10/11/24 0014 10/11/24 0742 10/12/24 0528 10/13/24 0848 10/14/24 0525 10/15/24 0555  WBC  --    < > 12.5* 10.5 11.0* 12.1*   LATICACIDVEN 0.8  --   --   --   --   --    < > = values in this interval not displayed.   Microbiology Recent Results (from the past 240 hours)  Surgical pcr screen     Status: None   Collection Time: 10/14/24  8:09 AM   Specimen: Nasal Mucosa; Nasal Swab  Result Value Ref Range Status   MRSA, PCR NEGATIVE NEGATIVE Final   Staphylococcus aureus NEGATIVE NEGATIVE Final    Comment: (NOTE) The Xpert SA Assay (FDA approved for NASAL specimens in patients 34 years of age and older), is one component of a comprehensive surveillance program. It is not intended to diagnose infection nor to guide or monitor treatment. Performed at [facility], [address], [place], [place] [address]  Medications:    acetaminophen   1,000 mg Oral Q6H   Chlorhexidine  Gluconate Cloth  6 each Topical Daily   enoxaparin  (LOVENOX ) injection  55 mg Subcutaneous Q24H   influenza vac split trivalent PF  0.5 mL Intramuscular Tomorrow-1000   melatonin  3 mg Oral QHS   methocarbamol   500 mg Oral QID   nicotine   21 mg Transdermal Daily   pantoprazole  (PROTONIX ) IV  40 mg Intravenous Q24H   Continuous Infusions:      LOS: 6 days   Erle Odell Castor  [facility]  10/16/2024, 7:34 AM

## 2024-10-17 DIAGNOSIS — K5792 Diverticulitis of intestine, part unspecified, without perforation or abscess without bleeding: Secondary | ICD-10-CM | POA: Diagnosis not present

## 2024-10-17 LAB — CBC
HCT: 39.1 % (ref 39.0–52.0)
Hemoglobin: 12.3 g/dL — ABNORMAL LOW (ref 13.0–17.0)
MCH: 27.2 pg (ref 26.0–34.0)
MCHC: 31.5 g/dL (ref 30.0–36.0)
MCV: 86.5 fL (ref 80.0–100.0)
Platelets: 354 K/uL (ref 150–400)
RBC: 4.52 MIL/uL (ref 4.22–5.81)
RDW: 13.9 % (ref 11.5–15.5)
WBC: 9.3 K/uL (ref 4.0–10.5)
nRBC: 0 % (ref 0.0–0.2)

## 2024-10-17 LAB — BASIC METABOLIC PANEL WITH GFR
Anion gap: 10 (ref 5–15)
BUN: 7 mg/dL (ref 6–20)
CO2: 25 mmol/L (ref 22–32)
Calcium: 9 mg/dL (ref 8.9–10.3)
Chloride: 100 mmol/L (ref 98–111)
Creatinine, Ser: 0.81 mg/dL (ref 0.61–1.24)
GFR, Estimated: >60 mL/min (ref >60)
Glucose, Bld: 172 mg/dL — ABNORMAL HIGH (ref 70–99)
Potassium: 3.7 mmol/L (ref 3.5–5.1)
Sodium: 135 mmol/L (ref 135–145)

## 2024-10-17 LAB — GLUCOSE, CAPILLARY
Glucose-Capillary: 114 mg/dL — ABNORMAL HIGH (ref 70–99)
Glucose-Capillary: 155 mg/dL — ABNORMAL HIGH (ref 70–99)
Glucose-Capillary: 139 mg/dL — ABNORMAL HIGH (ref 70–99)

## 2024-10-17 MED ORDER — HYDROMORPHONE HCL 1 MG/ML IJ SOLN
1.0000 mg | Freq: Four times a day (QID) | INTRAMUSCULAR | Status: DC | PRN
  Administered 2024-10-17 – 2024-10-24 (×24): 1 mg via INTRAVENOUS
  Filled 2024-10-17 (×26): qty 1

## 2024-10-17 MED ORDER — INSULIN ASPART 100 UNIT/ML IJ SOLN
0.0000 [IU] | Freq: Three times a day (TID) | INTRAMUSCULAR | Status: DC
  Administered 2024-10-26: 5 [IU] via SUBCUTANEOUS
  Administered 2024-10-27: 2 [IU] via SUBCUTANEOUS
  Filled 2024-10-17 (×4): qty 2
  Filled 2024-10-17: qty 3
  Filled 2024-10-17: qty 5

## 2024-10-17 MED ORDER — OXYCODONE HCL 5 MG PO TABS
10.0000 mg | ORAL_TABLET | Freq: Four times a day (QID) | ORAL | Status: DC | PRN
  Administered 2024-10-17 – 2024-10-27 (×27): 10 mg via ORAL
  Filled 2024-10-17 (×30): qty 2

## 2024-10-17 MED ORDER — INSULIN ASPART 100 UNIT/ML IJ SOLN
3.0000 [IU] | Freq: Three times a day (TID) | INTRAMUSCULAR | Status: DC
  Filled 2024-10-17 (×2): qty 3

## 2024-10-17 MED ORDER — INSULIN ASPART 100 UNIT/ML IJ SOLN
0.0000 [IU] | Freq: Every day | INTRAMUSCULAR | Status: DC
  Administered 2024-10-26: 2 [IU] via SUBCUTANEOUS
  Filled 2024-10-17: qty 3
  Filled 2024-10-17: qty 2

## 2024-10-17 NOTE — Progress Notes (Signed)
 [facility] PROGRESS NOTE    Progress Note  Logan Lucas  FMW:[id number] DOB: 08-04-1976 DOA: 10/10/2024 PCP: Patient, No Pcp Per     Brief Narrative:   Logan Lucas is an 48 y.o. male past medical history significant for essential hypertension, GI bleed, diet-controlled diabetes mellitus type 2, BMI 38 came in with acute complicated diverticulitis with small perforation and small abscess  Assessment/Plan:   Acute recurrent perforated sigmoid Diverticulitis General surgery on board status post right surgical intervention for perforated sigmoid diverticulitis/sigmoid colostomy on 10/14/2024. Antibiotics held. Further management per surgery. Try to focus on oral narcotics IV for breakthrough. Tolerating his liquids no vomiting. Wound VAC per surgery.  Intermittent chest pain and dyspnea: Continue IV Protonix , chest x-ray negative. No evidence of ischemia.  Elevated blood pressure without a diagnosis of hypertension. Continue to monitor.  Diabetes mellitus type 2 diet controlled: Tolerating full liquid diet blood glucose is rising start sliding scale insulin .  Asthma: Continue butyryl.  Incidental bilateral adrenal nodule seen on CT: Follow-up PCP as an outpatient.  Left kidney complex cyst: Follow-up PCP as an outpatient.  Will need an MRI as an outpatient.   DVT prophylaxis: lovenox  Family Communication:none Status is: Inpatient Remains inpatient appropriate because: Acute perforated diverticulitis    Code Status:     Code Status Orders  (From admission, onward)           Start     Ordered   10/10/24 2338  Full code  Continuous       Question:  By:  Answer:  Consent: discussion documented in EHR   10/10/24 2339           Code Status History     Date Active Date Inactive Code Status Order ID Comments User Context   08/25/2024 0738 08/31/2024 1811 Full Code [id number]  Zella Katha HERO, MD ED   07/31/2022 2310 08/05/2022 0035 Full  Code [id number]  Emmy Alf, MD ED   05/24/2022 1640 05/27/2022 2054 Full Code [id number]  Susen Pastor, MD ED   01/23/2021 1549 01/26/2021 1941 Full Code [id number]  Hilma Rankins, MD ED   10/14/2019 1205 10/17/2019 1734 Full Code [id number]  Tobie Yetta HERO, MD ED   06/17/2018 2210 06/19/2018 1751 Full Code [id number]  Lonzo Pollen, MD Inpatient         IV Access:   Peripheral IV   Procedures and diagnostic studies:   No results found.   Medical Consultants:   None.   Subjective:    Logan Lucas pain is controlled.  Objective:    Vitals:   10/16/24 0512 10/16/24 1408 10/16/24 2009 10/17/24 0355  BP: (!) 140/84 (!) 146/88 (!) 136/93 129/82  Pulse: 73 79 79 68  Resp: 20 18 20 18   Temp: 99.1 F (37.3 C) 99 F (37.2 C) 99.4 F (37.4 C) 98.9 F (37.2 C)  TempSrc: Oral  Oral Oral  SpO2: 97% 94% 97% 98%  Weight:      Height:       SpO2: 98 % O2 Flow Rate (L/min): 2 L/min   Intake/Output Summary (Last 24 hours) at 10/17/2024 0753 Last data filed at 10/17/2024 0616 Gross per 24 hour  Intake 1647.07 ml  Output 2300 ml  Net -652.93 ml   Filed Weights   10/10/24 1843 10/10/24 1903 10/14/24 0910  Weight: 113.4 kg 113.4 kg 113.4 kg    Exam: General exam: In no acute distress. Respiratory system: Good air movement and clear to auscultation. Cardiovascular system:  S1 & S2 heard, RRR. No JVD. Gastrointestinal system: Abdomen is nondistended, soft and nontender.  Extremities: No pedal edema. Skin: No rashes, lesions or ulcers Psychiatry: Judgement and insight appear normal. Mood & affect appropriate.  Data Reviewed:    Labs: Basic Metabolic Panel: Recent Labs  Lab 10/12/24 0528 10/13/24 0848 10/14/24 0525 10/15/24 0555 10/17/24 0554  NA 135 136 137 135 135  K 4.0 4.2 4.1 4.0 3.7  CL 102 101 100 102 100  CO2 21* 26 27 22 25   GLUCOSE 89 127* 106* 137* 172*  BUN 8 6 6 7 7   CREATININE 0.87 0.94 1.11 0.86 0.81  CALCIUM  9.0 9.0 9.3 8.9 9.0    GFR Estimated Creatinine Clearance: 140.7 mL/min (by C-G formula based on SCr of 0.81 mg/dL). Liver Function Tests: Recent Labs  Lab 10/10/24 1921  AST 16  ALT 14  ALKPHOS 131*  BILITOT 0.4  PROT 7.1  ALBUMIN 3.8   Recent Labs  Lab 10/10/24 1921  LIPASE 23   No results for input(s): AMMONIA in the last 168 hours. Coagulation profile No results for input(s): INR, PROTIME in the last 168 hours. COVID-19 Labs  No results for input(s): DDIMER, FERRITIN, LDH, CRP in the last 72 hours.  Lab Results  Component Value Date   SARSCOV2NAA NEGATIVE 09/18/2023   SARSCOV2NAA NEGATIVE 09/12/2023   SARSCOV2NAA NEGATIVE 02/28/2023   SARSCOV2NAA NEGATIVE 01/23/2021    CBC: Recent Labs  Lab 10/12/24 0528 10/13/24 0848 10/14/24 0525 10/15/24 0555 10/17/24 0554  WBC 12.5* 10.5 11.0* 12.1* 9.3  HGB 13.2 13.6 13.4 13.5 12.3*  HCT 41.6 42.6 41.9 42.2 39.1  MCV 86.1 85.4 85.0 85.3 86.5  PLT 270 270 301 342 354   Cardiac Enzymes: No results for input(s): CKTOTAL, CKMB, CKMBINDEX, TROPONINI in the last 168 hours. BNP (last 3 results) No results for input(s): PROBNP in the last 8760 hours. CBG: Recent Labs  Lab 10/14/24 1507  GLUCAP 179*   D-Dimer: No results for input(s): DDIMER in the last 72 hours. Hgb A1c: No results for input(s): HGBA1C in the last 72 hours. Lipid Profile: No results for input(s): CHOL, HDL, LDLCALC, TRIG, CHOLHDL, LDLDIRECT in the last 72 hours. Thyroid function studies: No results for input(s): TSH, T4TOTAL, T3FREE, THYROIDAB in the last 72 hours.  Invalid input(s): FREET3 Anemia work up: No results for input(s): VITAMINB12, FOLATE, FERRITIN, TIBC, IRON, RETICCTPCT in the last 72 hours. Sepsis Labs: Recent Labs  Lab 10/11/24 0014 10/11/24 0742 10/13/24 0848 10/14/24 0525 10/15/24 0555 10/17/24 0554  WBC  --    < > 10.5 11.0* 12.1* 9.3  LATICACIDVEN 0.8  --   --   --   --   --     < > = values in this interval not displayed.   Microbiology Recent Results (from the past 240 hours)  Surgical pcr screen     Status: None   Collection Time: 10/14/24  8:09 AM   Specimen: Nasal Mucosa; Nasal Swab  Result Value Ref Range Status   MRSA, PCR NEGATIVE NEGATIVE Final   Staphylococcus aureus NEGATIVE NEGATIVE Final    Comment: (NOTE) The Xpert SA Assay (FDA approved for NASAL specimens in patients 58 years of age and older), is one component of a comprehensive surveillance program. It is not intended to diagnose infection nor to guide or monitor treatment. Performed at [facility], [address] [place], [place] [address]      Medications:    acetaminophen   1,000 mg Oral  Q6H   Chlorhexidine  Gluconate Cloth  6 each Topical Daily   enoxaparin  (LOVENOX ) injection  55 mg Subcutaneous Q24H   feeding supplement  237 mL Oral BID BM   influenza vac split trivalent PF  0.5 mL Intramuscular Tomorrow-1000   ketorolac   15 mg Intravenous Q8H   melatonin  3 mg Oral QHS   methocarbamol   500 mg Oral QID   nicotine   21 mg Transdermal Daily   pantoprazole  (PROTONIX ) IV  40 mg Intravenous Q24H   Continuous Infusions:  sodium chloride  50 mL/hr at 10/17/24 0616       LOS: 7 days   Logan Lucas  [facility]  10/17/2024, 7:53 AM

## 2024-10-17 NOTE — Progress Notes (Signed)
 PT Cancellation Note  Patient Details Name: Logan Lucas MRN: [id number] DOB: 07-26-1976   Cancelled Treatment:    Reason Eval/Treat Not Completed: Other (comment). Arrived at 15:39, pt in bed, declines PT eval, reports has been up in recliner all day and walked in hallway with nursing. Per RN, pt did not ambulate in hallway. Pt declines PT eval today. Will check back for PT eval as schedule permits.   Metta Ave PT, DPT 10/17/24, 3:43 PM

## 2024-10-17 NOTE — Progress Notes (Signed)
 3 Days Post-Op   Subjective/Chief Complaint: Frustrated this am, having air in bag, oob   Objective: Vital signs in last 24 hours: Temp:  [98.9 F (37.2 C)-99.4 F (37.4 C)] 98.9 F (37.2 C) (11/22 0355) Pulse Rate:  [68-79] 68 (11/22 0355) Resp:  [18-20] 18 (11/22 0355) BP: (129-146)/(82-93) 129/82 (11/22 0355) SpO2:  [94 %-98 %] 98 % (11/22 0355) Last BM Date : 10/14/24 (NEW Colostomy - bloody drainage from OR)  Intake/Output from previous day: 11/21 0701 - 11/22 0700 In: 1647.1 [P.O.:875; I.V.:772.1] Out: 2300 [Urine:2300] Intake/Output this shift: No intake/output data recorded.  General nad Ab vac in place, stoma pink with some air, approp tender  Lab Results:  Recent Labs    10/15/24 0555 10/17/24 0554  WBC 12.1* 9.3  HGB 13.5 12.3*  HCT 42.2 39.1  PLT 342 354   BMET Recent Labs    10/15/24 0555 10/17/24 0554  NA 135 135  K 4.0 3.7  CL 102 100  CO2 22 25  GLUCOSE 137* 172*  BUN 7 7  CREATININE 0.86 0.81  CALCIUM  8.9 9.0   PT/INR No results for input(s): LABPROT, INR in the last 72 hours. ABG No results for input(s): PHART, HCO3 in the last 72 hours.  Invalid input(s): PCO2, PO2  Studies/Results: No results found.  Anti-infectives: Anti-infectives (From admission, onward)    Start     Dose/Rate Route Frequency Ordered Stop   10/14/24 2200  piperacillin -tazobactam (ZOSYN ) IVPB 3.375 g        3.375 g 12.5 mL/hr over 240 Minutes Intravenous Every 8 hours 10/14/24 1510 10/15/24 1739   10/11/24 0600  piperacillin -tazobactam (ZOSYN ) IVPB 3.375 g  Status:  Discontinued        3.375 g 12.5 mL/hr over 240 Minutes Intravenous Every 8 hours 10/10/24 2350 10/14/24 1510   10/10/24 2300  piperacillin -tazobactam (ZOSYN ) IVPB 3.375 g        3.375 g 100 mL/hr over 30 Minutes Intravenous  Once 10/10/24 2252 10/10/24 2349       Assessment/Plan: POD 3 lap assist sigmoid colectomy for perforated diverticulitis-Wilson -wound vac to open  lower midline wound-TOC for home vac -WOC consult for new colostomy -OOB, IS-discussed importance of ambulation -toradol  added yesterday by team, follow Cr  -pulm toilet, ambulate   FEN - soft diet VTE - Lovenox  ID - Zosyn  x 24 hrs     HTN DM asthma Disposition: needs home vac setup, needs to tolerate diet and have stool out stoma, continued woc teaching, earliest dc will be Monday   Logan Lucas 10/17/2024

## 2024-10-17 NOTE — Plan of Care (Signed)

## 2024-10-18 DIAGNOSIS — K5792 Diverticulitis of intestine, part unspecified, without perforation or abscess without bleeding: Secondary | ICD-10-CM | POA: Diagnosis not present

## 2024-10-18 LAB — BASIC METABOLIC PANEL WITH GFR
Anion gap: 10 (ref 5–15)
BUN: 10 mg/dL (ref 6–20)
CO2: 25 mmol/L (ref 22–32)
Calcium: 8.6 mg/dL — ABNORMAL LOW (ref 8.9–10.3)
Chloride: 102 mmol/L (ref 98–111)
Creatinine, Ser: 0.77 mg/dL (ref 0.61–1.24)
GFR, Estimated: >60 mL/min (ref >60)
Glucose, Bld: 176 mg/dL — ABNORMAL HIGH (ref 70–99)
Potassium: 3.8 mmol/L (ref 3.5–5.1)
Sodium: 137 mmol/L (ref 135–145)

## 2024-10-18 LAB — GLUCOSE, CAPILLARY
Glucose-Capillary: 132 mg/dL — ABNORMAL HIGH (ref 70–99)
Glucose-Capillary: 123 mg/dL — ABNORMAL HIGH (ref 70–99)
Glucose-Capillary: 154 mg/dL — ABNORMAL HIGH (ref 70–99)
Glucose-Capillary: 171 mg/dL — ABNORMAL HIGH (ref 70–99)

## 2024-10-18 MED ORDER — POLYETHYLENE GLYCOL 3350 17 G PO PACK
17.0000 g | PACK | Freq: Every day | ORAL | Status: DC
  Administered 2024-10-18 – 2024-10-27 (×6): 17 g via ORAL
  Filled 2024-10-18 (×9): qty 1

## 2024-10-18 MED ORDER — KETOROLAC TROMETHAMINE 15 MG/ML IJ SOLN
15.0000 mg | Freq: Three times a day (TID) | INTRAMUSCULAR | Status: AC
  Administered 2024-10-18 – 2024-10-20 (×6): 15 mg via INTRAVENOUS
  Filled 2024-10-18 (×6): qty 1

## 2024-10-18 NOTE — Progress Notes (Signed)
 Patient ID: KENDRICKS REAP, male   DOB: Aug 23, 1976, 48 y.o.   MRN: [id number]   Acute Care Surgery Service Progress Note:    Chief Complaint/Subjective: No nausea or vomiting.  Tolerating soft diet Air in bag But no stool Having some spasms at times  Objective: Vital signs in last 24 hours: Temp:  [97.7 F (36.5 C)-98.2 F (36.8 C)] 97.7 F (36.5 C) (11/23 0449) Pulse Rate:  [69-72] 72 (11/23 0449) Resp:  [16] 16 (11/23 0449) BP: (140-151)/(95-98) 151/98 (11/23 0449) SpO2:  [98 %] 98 % (11/23 0449) Last BM Date : 10/14/24 (NEW Colostomy - bloody drainage from OR)  Intake/Output from previous day: 11/22 0701 - 11/23 0700 In: 837 [P.O.:837] Out: 1450 [Urine:1450] Intake/Output this shift: No intake/output data recorded.  Lungs nonlabored  Cardiovascular: reg  Abd: Soft, obese, wound VAC intact, colostomy appears viable.  There is stoma sweat in the bag but no stool.  +air in bag Appropriate tenderness  Extremities: no edema, +SCDs  Neuro: alert, nonfocal  Lab Results: CBC  Recent Labs    10/17/24 0554  WBC 9.3  HGB 12.3*  HCT 39.1  PLT 354   BMET Recent Labs    10/17/24 0554 10/18/24 0908  NA 135 137  K 3.7 3.8  CL 100 102  CO2 25 25  GLUCOSE 172* 176*  BUN 7 10  CREATININE 0.81 0.77  CALCIUM  9.0 8.6*   LFT    Latest Ref Rng & Units 10/10/2024    7:21 PM 08/25/2024    3:23 AM 08/13/2024    8:00 PM  Hepatic Function  Total Protein 6.5 - 8.1 g/dL 7.1  7.1  6.3   Albumin 3.5 - 5.0 g/dL 3.8  3.8  3.5   AST 15 - 41 U/L 16  12  14    ALT 0 - 44 U/L 14  10  14    Alk Phosphatase 38 - 126 U/L 131  143  88   Total Bilirubin 0.0 - 1.2 mg/dL 0.4  0.7  0.8    PT/INR No results for input(s): LABPROT, INR in the last 72 hours. ABG No results for input(s): PHART, HCO3 in the last 72 hours.  Invalid input(s): PCO2, PO2  Studies/Results:  Anti-infectives: Anti-infectives (From admission, onward)    Start     Dose/Rate Route Frequency  Ordered Stop   10/14/24 2200  piperacillin -tazobactam (ZOSYN ) IVPB 3.375 g        3.375 g 12.5 mL/hr over 240 Minutes Intravenous Every 8 hours 10/14/24 1510 10/15/24 1739   10/11/24 0600  piperacillin -tazobactam (ZOSYN ) IVPB 3.375 g  Status:  Discontinued        3.375 g 12.5 mL/hr over 240 Minutes Intravenous Every 8 hours 10/10/24 2350 10/14/24 1510   10/10/24 2300  piperacillin -tazobactam (ZOSYN ) IVPB 3.375 g        3.375 g 100 mL/hr over 30 Minutes Intravenous  Once 10/10/24 2252 10/10/24 2349       Medications: Scheduled Meds:  acetaminophen   1,000 mg Oral Q6H   Chlorhexidine  Gluconate Cloth  6 each Topical Daily   enoxaparin  (LOVENOX ) injection  55 mg Subcutaneous Q24H   feeding supplement  237 mL Oral BID BM   influenza vac split trivalent PF  0.5 mL Intramuscular Tomorrow-1000   insulin  aspart  0-15 Units Subcutaneous TID WC   insulin  aspart  0-5 Units Subcutaneous QHS   insulin  aspart  3 Units Subcutaneous TID WC   ketorolac   15 mg Intravenous Q8H   melatonin  3 mg Oral QHS   methocarbamol   500 mg Oral QID   nicotine   21 mg Transdermal Daily   pantoprazole  (PROTONIX ) IV  40 mg Intravenous Q24H   Continuous Infusions:  sodium chloride  50 mL/hr at 10/17/24 1600   PRN Meds:.albuterol , HYDROmorphone  (DILAUDID ) injection, naLOXone  (NARCAN )  injection, ondansetron  (ZOFRAN ) IV, mouth rinse, oxyCODONE   Assessment/Plan: Patient Active Problem List   Diagnosis Date Noted   Diverticulitis 10/10/2024   Diet-controlled diabetes mellitus (HCC) 10/10/2024   Diverticulitis of both large and small intestine with perforation with bleeding 08/25/2024   Acute diverticulitis 07/31/2022   Hypertriglyceridemia 06/05/2022   Anxiety 06/05/2022   Severe hyperglycemia due to diabetes mellitus (HCC) 05/24/2022   Cocaine abuse (HCC) 01/25/2021   Uncontrolled type 2 diabetes mellitus with hyperglycemia (HCC) 01/25/2021   Abnormal stress test    Alcohol use 01/23/2021   Rectal bleeding  01/23/2021   Asthma, chronic    Essential hypertension    Tobacco abuse    Duodenitis 10/14/2019   Chest pain 06/17/2018   s/p Procedure(s):   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  - hgb stable -wound vac to open lower midline wound -WOC consult for new colostomy -OOB, IS-discussed importance of ambulation -readded toradol  11/23 -miralax  dose 11/23   FEN -soft diet VTE - Lovenox  ID - Zosyn  x 24 hrs    HTN DM asthma Disposition: probable dc on Tuesday; d/w pt waiting for stool in bag and approval for wound vac. Discussed w-d dressing if insurance doesn't cover   LOS: 8 days    Camellia HERO. Tanda, MD, FACS General, Bariatric, & Minimally Invasive Surgery [phone] [facility], [facility]

## 2024-10-18 NOTE — Plan of Care (Signed)
  Problem: Education: Goal: Knowledge of General Education information will improve Description: Including pain rating scale, medication(s)/side effects and non-pharmacologic comfort measures Outcome: Progressing   Problem: Clinical Measurements: Goal: Ability to maintain clinical measurements within normal limits will improve Outcome: Progressing Goal: Will remain free from infection Outcome: Progressing Goal: Cardiovascular complication will be avoided Outcome: Progressing   Problem: Nutrition: Goal: Adequate nutrition will be maintained Outcome: Progressing   Problem: Elimination: Goal: Will not experience complications related to urinary retention Outcome: Progressing   Problem: Pain Managment: Goal: General experience of comfort will improve and/or be controlled Outcome: Progressing   Problem: Safety: Goal: Ability to remain free from injury will improve Outcome: Progressing

## 2024-10-18 NOTE — Progress Notes (Addendum)
 [facility] PROGRESS NOTE    Progress Note  HANDY MCLOUD  FMW:[id number] DOB: 12/08/1975 DOA: 10/10/2024 PCP: Patient, No Pcp Per     Brief Narrative:   Logan Lucas is an 48 y.o. male past medical history significant for essential hypertension, GI bleed, diet-controlled diabetes mellitus type 2, BMI 38 came in with acute complicated diverticulitis with small perforation and small abscess  Assessment/Plan:   Acute recurrent perforated sigmoid Diverticulitis Status post right surgical intervention for perforated sigmoid diverticulitis/sigmoid colostomy on 10/14/2024. Off antibiotics. Wound VAC and further management per surgery. Ostomy nurse has been consulted. Has been using tramadol  and oral oxycodone  with improvement in his pain. Tolerating soft diet.   Patient did not want to speak to me this morning, and he related he wanted another physician.  The nurse was with me in the room she related that it was because of his narcotic change.  We did not stop his narcotics we just spread them out,  he was on IV Dilaudid , oxycodone  and tramadol  for pain. I have talked to general surgery they will take over and we will continue as consultants. Appreciate general surgery's assistance.   Intermittent chest pain and dyspnea: Continue IV Protonix , chest x-ray negative. No evidence of ischemia.  Elevated blood pressure without a diagnosis of hypertension. Continue to monitor will need further follow-up as an outpatient.  Diabetes mellitus type 2 diet controlled: Blood glucose seems to be well-controlled currently on sliding scale insulin . Has not required any insulin .  Continue CBGs AC and at bedtime  Asthma: Continue butyryl.  Incidental bilateral adrenal nodule seen on CT: Follow-up PCP as an outpatient.  Left kidney complex cyst: Follow-up PCP as an outpatient.  Will need an MRI as an outpatient.   DVT prophylaxis: lovenox  Family Communication:none Status  is: Inpatient Remains inpatient appropriate because: Acute perforated diverticulitis    Code Status:     Code Status Orders  (From admission, onward)           Start     Ordered   10/10/24 2338  Full code  Continuous       Question:  By:  Answer:  Consent: discussion documented in EHR   10/10/24 2339           Code Status History     Date Active Date Inactive Code Status Order ID Comments User Context   08/25/2024 0738 08/31/2024 1811 Full Code [id number]  Zella Katha HERO, MD ED   07/31/2022 2310 08/05/2022 0035 Full Code [id number]  Emmy Alf, MD ED   05/24/2022 1640 05/27/2022 2054 Full Code [id number]  Susen Pastor, MD ED   01/23/2021 1549 01/26/2021 1941 Full Code [id number]  Hilma Rankins, MD ED   10/14/2019 1205 10/17/2019 1734 Full Code [id number]  Tobie Yetta HERO, MD ED   06/17/2018 2210 06/19/2018 1751 Full Code [id number]  Lonzo Pollen, MD Inpatient         IV Access:   Peripheral IV   Procedures and diagnostic studies:   No results found.   Medical Consultants:   None.   Subjective:    Logan Lucas patient relates he does not want to speak directly with me.  Objective:    Vitals:   10/16/24 2009 10/17/24 0355 10/17/24 1312 10/18/24 0449  BP: (!) 136/93 129/82 (!) 140/95 (!) 151/98  Pulse: 79 68 69 72  Resp: 20 18 16 16   Temp: 99.4 F (37.4 C) 98.9 F (37.2 C) 98.2 F (36.8 C) 97.7 F (36.5  C)  TempSrc: Oral Oral Oral Oral  SpO2: 97% 98% 98% 98%  Weight:      Height:       SpO2: 98 % O2 Flow Rate (L/min): 2 L/min   Intake/Output Summary (Last 24 hours) at 10/18/2024 0816 Last data filed at 10/18/2024 0700 Gross per 24 hour  Intake 837 ml  Output 1450 ml  Net -613 ml   Filed Weights   10/10/24 1843 10/10/24 1903 10/14/24 0910  Weight: 113.4 kg 113.4 kg 113.4 kg    Exam: General exam: Refused physical exam.  Data Reviewed:    Labs: Basic Metabolic Panel: Recent Labs  Lab 10/12/24 0528 10/13/24 0848  10/14/24 0525 10/15/24 0555 10/17/24 0554  NA 135 136 137 135 135  K 4.0 4.2 4.1 4.0 3.7  CL 102 101 100 102 100  CO2 21* 26 27 22 25   GLUCOSE 89 127* 106* 137* 172*  BUN 8 6 6 7 7   CREATININE 0.87 0.94 1.11 0.86 0.81  CALCIUM  9.0 9.0 9.3 8.9 9.0   GFR Estimated Creatinine Clearance: 140.7 mL/min (by C-G formula based on SCr of 0.81 mg/dL). Liver Function Tests: No results for input(s): AST, ALT, ALKPHOS, BILITOT, PROT, ALBUMIN in the last 168 hours.  No results for input(s): LIPASE, AMYLASE in the last 168 hours.  No results for input(s): AMMONIA in the last 168 hours. Coagulation profile No results for input(s): INR, PROTIME in the last 168 hours. COVID-19 Labs  No results for input(s): DDIMER, FERRITIN, LDH, CRP in the last 72 hours.  Lab Results  Component Value Date   SARSCOV2NAA NEGATIVE 09/18/2023   SARSCOV2NAA NEGATIVE 09/12/2023   SARSCOV2NAA NEGATIVE 02/28/2023   SARSCOV2NAA NEGATIVE 01/23/2021    CBC: Recent Labs  Lab 10/12/24 0528 10/13/24 0848 10/14/24 0525 10/15/24 0555 10/17/24 0554  WBC 12.5* 10.5 11.0* 12.1* 9.3  HGB 13.2 13.6 13.4 13.5 12.3*  HCT 41.6 42.6 41.9 42.2 39.1  MCV 86.1 85.4 85.0 85.3 86.5  PLT 270 270 301 342 354   Cardiac Enzymes: No results for input(s): CKTOTAL, CKMB, CKMBINDEX, TROPONINI in the last 168 hours. BNP (last 3 results) No results for input(s): PROBNP in the last 8760 hours. CBG: Recent Labs  Lab 10/14/24 1507 10/17/24 1204 10/17/24 1656 10/17/24 2059 10/18/24 0729  GLUCAP 179* 114* 155* 139* 132*   D-Dimer: No results for input(s): DDIMER in the last 72 hours. Hgb A1c: No results for input(s): HGBA1C in the last 72 hours. Lipid Profile: No results for input(s): CHOL, HDL, LDLCALC, TRIG, CHOLHDL, LDLDIRECT in the last 72 hours. Thyroid function studies: No results for input(s): TSH, T4TOTAL, T3FREE, THYROIDAB in the last 72  hours.  Invalid input(s): FREET3 Anemia work up: No results for input(s): VITAMINB12, FOLATE, FERRITIN, TIBC, IRON, RETICCTPCT in the last 72 hours. Sepsis Labs: Recent Labs  Lab 10/13/24 0848 10/14/24 0525 10/15/24 0555 10/17/24 0554  WBC 10.5 11.0* 12.1* 9.3   Microbiology Recent Results (from the past 240 hours)  Surgical pcr screen     Status: None   Collection Time: 10/14/24  8:09 AM   Specimen: Nasal Mucosa; Nasal Swab  Result Value Ref Range Status   MRSA, PCR NEGATIVE NEGATIVE Final   Staphylococcus aureus NEGATIVE NEGATIVE Final    Comment: (NOTE) The Xpert SA Assay (FDA approved for NASAL specimens in patients 29 years of age and older), is one component of a comprehensive surveillance program. It is not intended to diagnose infection nor to guide or monitor treatment. Performed at [facility], [address] [place], [place] [address]      Medications:    acetaminophen   1,000 mg Oral Q6H   Chlorhexidine  Gluconate Cloth  6 each Topical Daily   enoxaparin  (LOVENOX ) injection  55 mg Subcutaneous Q24H   feeding supplement  237 mL Oral BID BM   influenza vac split trivalent PF  0.5 mL Intramuscular Tomorrow-1000   insulin  aspart  0-15 Units Subcutaneous TID WC   insulin  aspart  0-5 Units Subcutaneous QHS   insulin  aspart  3 Units Subcutaneous TID WC   melatonin  3 mg Oral QHS   methocarbamol   500 mg Oral QID   nicotine   21 mg Transdermal Daily   pantoprazole  (PROTONIX ) IV  40 mg Intravenous Q24H   Continuous Infusions:  sodium chloride  50 mL/hr at 10/17/24 1600       LOS: 8 days   Erle Odell Castor  [facility]  10/18/2024, 8:16 AM

## 2024-10-18 NOTE — Progress Notes (Signed)
 PT Cancellation Note  Patient Details Name: Logan Lucas MRN: [id number] DOB: [birth year]   Cancelled Treatment:    Reason Eval/Treat Not Completed: Patient declined, no reason specified;   pt again refuses PT, states it is not a good time because he just had a coughing spell, when encouraged pt stated he ws getting pain meds (IV), again advised that if was in pain this would be a good time to attempt to mobilize, pt then stated meds make me dizzy  This is second attempt and second consecutive pt refusal. Pt advised that next attempt to complete PT eval will be the final attempt.   Rexene, PT  Acute Rehab Dept [facility]) [phone]  10/18/2024   TRUDY REXENE 10/18/2024, 1:27 PM

## 2024-10-19 DIAGNOSIS — K5792 Diverticulitis of intestine, part unspecified, without perforation or abscess without bleeding: Secondary | ICD-10-CM | POA: Diagnosis not present

## 2024-10-19 LAB — BASIC METABOLIC PANEL WITH GFR
Anion gap: 9 (ref 5–15)
BUN: 12 mg/dL (ref 6–20)
CO2: 26 mmol/L (ref 22–32)
Calcium: 8.9 mg/dL (ref 8.9–10.3)
Chloride: 102 mmol/L (ref 98–111)
Creatinine, Ser: 0.84 mg/dL (ref 0.61–1.24)
GFR, Estimated: >60 mL/min (ref >60)
Glucose, Bld: 166 mg/dL — ABNORMAL HIGH (ref 70–99)
Potassium: 4 mmol/L (ref 3.5–5.1)
Sodium: 137 mmol/L (ref 135–145)

## 2024-10-19 LAB — CBC
HCT: 37 % — ABNORMAL LOW (ref 39.0–52.0)
Hemoglobin: 11.6 g/dL — ABNORMAL LOW (ref 13.0–17.0)
MCH: 26.7 pg (ref 26.0–34.0)
MCHC: 31.4 g/dL (ref 30.0–36.0)
MCV: 85.3 fL (ref 80.0–100.0)
Platelets: 395 K/uL (ref 150–400)
RBC: 4.34 MIL/uL (ref 4.22–5.81)
RDW: 14 % (ref 11.5–15.5)
WBC: 9.8 K/uL (ref 4.0–10.5)
nRBC: 0 % (ref 0.0–0.2)

## 2024-10-19 LAB — GLUCOSE, CAPILLARY
Glucose-Capillary: 115 mg/dL — ABNORMAL HIGH (ref 70–99)
Glucose-Capillary: 119 mg/dL — ABNORMAL HIGH (ref 70–99)
Glucose-Capillary: 143 mg/dL — ABNORMAL HIGH (ref 70–99)
Glucose-Capillary: 139 mg/dL — ABNORMAL HIGH (ref 70–99)

## 2024-10-19 MED ORDER — CARMEX CLASSIC LIP BALM EX OINT: TOPICAL_OINTMENT | CUTANEOUS | Status: DC | PRN

## 2024-10-19 NOTE — NC FL2 (Signed)
 [place]  MEDICAID FL2 LEVEL OF CARE FORM     IDENTIFICATION  Patient Name: Logan Lucas Birthdate: 1976-04-10 Sex: male Admission Date (Current Location): 10/10/2024  [facility] and [place] Number:  Producer, Television/film/video and Address:  [facility],  [address], [place] [address]      Provider Number: [id number]  Attending Physician Name and Address:  Kristopher, Md, MD  Relative Name and Phone Number:  Chyrl Gulley(Uncle) [phone]    Current Level of Care: Hospital Recommended Level of Care: Skilled Nursing Facility Prior Approval Number:    Date Approved/Denied:   PASRR Number: [id number]  Discharge Plan: SNF    Current Diagnoses: Patient Active Problem List   Diagnosis Date Noted   Diverticulitis 10/10/2024   Diet-controlled diabetes mellitus (HCC) 10/10/2024   Diverticulitis of both large and small intestine with perforation with bleeding 08/25/2024   Acute diverticulitis 07/31/2022   Hypertriglyceridemia 06/05/2022   Anxiety 06/05/2022   Severe hyperglycemia due to diabetes mellitus (HCC) 05/24/2022   Cocaine abuse (HCC) 01/25/2021   Uncontrolled type 2 diabetes mellitus with hyperglycemia (HCC) 01/25/2021   Abnormal stress test    Alcohol use 01/23/2021   Rectal bleeding 01/23/2021   Asthma, chronic    Essential hypertension    Tobacco abuse    Duodenitis 10/14/2019   Chest pain 06/17/2018    Orientation RESPIRATION BLADDER Height & Weight     Self, Time, Situation, Place  Normal Continent Weight: 113.4 kg Height:  5' 8 (172.7 cm)  BEHAVIORAL SYMPTOMS/MOOD NEUROLOGICAL BOWEL NUTRITION STATUS      Colostomy Diet (Soft)  AMBULATORY STATUS COMMUNICATION OF NEEDS Skin   Limited Assist Verbally Wound Vac (lower midline abd wound wound vac dsg changes.)                       Personal Care Assistance Level of Assistance  Bathing, Feeding, Dressing Bathing Assistance: Limited assistance Feeding assistance: Limited  assistance Dressing Assistance: Limited assistance     Functional Limitations Info  Sight, Hearing, Speech Sight Info: Impaired (readers) Hearing Info: Adequate Speech Info: Impaired (Dentures-top)    SPECIAL CARE FACTORS FREQUENCY   (Nsg-wound vac dsg changes.)                    Contractures Contractures Info: Not present    Additional Factors Info  Code Status, Allergies Code Status Info: Full Allergies Info: NKDA           Current Medications (10/19/2024):  This is the current hospital active medication list Current Facility-Administered Medications  Medication Dose Route Frequency Provider Last Rate Last Admin   0.9 %  sodium chloride  infusion   Intravenous Continuous Tanda Locus, MD   Stopping previously hung infusion at 10/18/24 0700   acetaminophen  (TYLENOL ) tablet 1,000 mg  1,000 mg Oral Q6H Edmundo Marjorie Lapine, PA-C   1,000 mg at 10/19/24 9048   albuterol  (PROVENTIL ) (2.5 MG/3ML) 0.083% nebulizer solution 2.5 mg  2.5 mg Nebulization Q6H PRN Hogan, Kendra Paige, PA-C       Chlorhexidine  Gluconate Cloth 2 % PADS 6 each  6 each Topical Daily Hogan, Kendra Paige, PA-C   6 each at 10/17/24 0919   enoxaparin  (LOVENOX ) injection 55 mg  55 mg Subcutaneous Q24H Hogan, Kendra Paige, PA-C   55 mg at 10/19/24 1017   feeding supplement (ENSURE PLUS HIGH PROTEIN) liquid 237 mL  237 mL Oral BID BM Odell Celinda Balo, MD   237 mL  at 10/19/24 9047   HYDROmorphone  (DILAUDID ) injection 1 mg  1 mg Intravenous Q6H PRN Odell Celinda Balo, MD   1 mg at 10/19/24 9046   insulin  aspart (novoLOG ) injection 0-15 Units  0-15 Units Subcutaneous TID WC Odell Celinda Balo, MD       insulin  aspart (novoLOG ) injection 0-5 Units  0-5 Units Subcutaneous QHS Odell Celinda Balo, MD       insulin  aspart (novoLOG ) injection 3 Units  3 Units Subcutaneous TID WC Odell Celinda Balo, MD       ketorolac  (TORADOL ) 15 MG/ML injection 15 mg  15 mg Intravenous Q8H Wilson, Eric, MD   15 mg at  10/19/24 1331   lip balm (CARMEX) ointment   Topical PRN Tanda Locus, MD       melatonin tablet 3 mg  3 mg Oral QHS Hogan, Kendra Paige, PA-C   3 mg at 10/18/24 2217   methocarbamol  (ROBAXIN ) tablet 500 mg  500 mg Oral QID Tammy Sor, PA-C   500 mg at 10/19/24 1329   naloxone  (NARCAN ) injection 0.4 mg  0.4 mg Intravenous PRN Hogan, Kendra Paige, PA-C       nicotine  (NICODERM CQ  - dosed in mg/24 hours) patch 21 mg  21 mg Transdermal Daily Hogan, Kendra Paige, PA-C   21 mg at 10/13/24 1011   ondansetron  (ZOFRAN ) injection 4 mg  4 mg Intravenous Q6H PRN Hogan, Kendra Paige, PA-C       Oral care mouth rinse  15 mL Mouth Rinse PRN Edmundo Marjorie Lapine, PA-C       oxyCODONE  (Oxy IR/ROXICODONE ) immediate release tablet 10 mg  10 mg Oral Q6H PRN Odell Celinda Balo, MD   10 mg at 10/19/24 1328   pantoprazole  (PROTONIX ) injection 40 mg  40 mg Intravenous Q24H Hogan, Kendra Paige, PA-C   40 mg at 10/19/24 0952   polyethylene glycol (MIRALAX  / GLYCOLAX ) packet 17 g  17 g Oral Daily Tanda Locus, MD   17 g at 10/18/24 1002     Discharge Medications: Please see discharge summary for a list of discharge medications.  Relevant Imaging Results:  Relevant Lab Results:   Additional Information ss[id number] [place], [place]

## 2024-10-19 NOTE — Consult Note (Addendum)
 WOC Nurse wound follow up Wound type: surgical- abdomen Measurement: 11 cm x 4 cm x 2.5 cm Wound bed: red, moist, adipose tissue scattered throughout Drainage (amount, consistency, odor) serosanguinous in cannister Periwound: intact Dressing procedure/placement/frequency:   Removed old NPWT dressing Filled wound with   ___1_ piece of black foam  Sealed NPWT dressing at HG Patient tolerated procedure well  WOC nurse will continue to provide NPWT dressing changed due to the complexity of the dressing change.       WOC Nurse ostomy follow up Stoma type/location:  LLQ colostomy Stomal assessment/size: 1 1/2 inch, red, moist, viable, flush with skin, os at center Peristomal assessment: intact,  Treatment options for stomal/peristomal skin: 2 inch barrier ring Output  none at time of visit Ostomy pouching: 2pc. Flat, would like to change to soft convex on next visit, will plan to use: 2 pc  soft convex skin barrier (lawson # [phone]) pouch (lawson #234) and barrier ring copywriter, advertising # [phone]) Education provided:  Explained stoma characteristics (budded, flush, color, texture, care) Demonstrated pouch change (cutting new skin barrier, measuring stoma, cleaning peristomal skin and stoma, use of barrier ring)- patient able to perform with verbal guidance Education on emptying when 1/3 to 1/2 full and how to empty Demonstrated burping flatus from pouch Demonstrated use of wick to clean spout  Discussed bathing, diet, gas, medication use, constipation Discussed risk of peristomal hernia Provided patient with [place] ordering information and marked items currently using Answered patient/family questions.   Enrolled patient in [place] Secure Start Discharge program: Yes- previous visit  WOC team will continue to follow for ostomy teaching and NPWT.  Thank you,  Doyal Polite, MSN, RN, [facility] WOC Team [phone] (Available Mon-Fri 0700-1500)

## 2024-10-19 NOTE — Plan of Care (Signed)

## 2024-10-19 NOTE — Progress Notes (Signed)
 [facility]  PROCEDURAL EXPEDITER PROGRESS NOTE  Patient Name: Logan Lucas  DOB:05-15-1976 Date of Admission: 10/10/2024  Date of Assessment:10/19/24   ----------------------------- Need approval of wound vac and dressing changes, If no HH coverage for wound vac, possible SNF  -------------------------------------------------------------------------------------------------------------------  [facility], Ronal DELENA Bald Please contact us  directly via secure chat (search for [facility]) or by calling us  at [phone] [facility]).

## 2024-10-19 NOTE — Progress Notes (Signed)
 [facility] PROGRESS NOTE    Progress Note  Logan Lucas  FMW:[id number] DOB: Dec 30, 1975 DOA: 10/10/2024 PCP: Patient, No Pcp Per     Brief Narrative:   Logan Lucas is an 48 y.o. male past medical history significant for essential hypertension, GI bleed, diet-controlled diabetes mellitus type 2, BMI 38 came in with acute complicated diverticulitis with small perforation and small abscess  Assessment/Plan:   Acute recurrent perforated sigmoid Diverticulitis Status post right surgical intervention for perforated sigmoid diverticulitis/sigmoid colostomy on 10/14/2024. Off antibiotics. Wound VAC in place, Ostomy nurse has been consulted. Has been using oral oxycodone  and IV Dilaudid .  With improvement in his pain. Tolerating soft diet.   I have talked to general surgery they will take over and we will continue as consultants. Further management per surgery.  Intermittent chest pain and dyspnea: Continue IV Protonix , chest x-ray negative. No evidence of ischemia.  Elevated blood pressure without a diagnosis of hypertension. Continue to monitor will need further follow-up as an outpatient.  Diabetes mellitus type 2 diet controlled: Blood glucose seems to be well-controlled currently on sliding scale insulin . Has not required any insulin .  Continue CBGs AC and at bedtime  Asthma: Continue butyryl.  Incidental bilateral adrenal nodule seen on CT: Follow-up PCP as an outpatient.  Left kidney complex cyst: Follow-up PCP as an outpatient.  Will need an MRI as an outpatient.   DVT prophylaxis: lovenox  Family Communication:none Status is: Inpatient Remains inpatient appropriate because: Acute perforated diverticulitis    Code Status:     Code Status Orders  (From admission, onward)           Start     Ordered   10/10/24 2338  Full code  Continuous       Question:  By:  Answer:  Consent: discussion documented in EHR   10/10/24 2339            Code Status History     Date Active Date Inactive Code Status Order ID Comments User Context   08/25/2024 0738 08/31/2024 1811 Full Code [id number]  Zella Katha HERO, MD ED   07/31/2022 2310 08/05/2022 0035 Full Code [id number]  Emmy Alf, MD ED   05/24/2022 1640 05/27/2022 2054 Full Code [id number]  Susen Pastor, MD ED   01/23/2021 1549 01/26/2021 1941 Full Code [id number]  Hilma Rankins, MD ED   10/14/2019 1205 10/17/2019 1734 Full Code [id number]  Tobie Yetta HERO, MD ED   06/17/2018 2210 06/19/2018 1751 Full Code [id number]  Lonzo Pollen, MD Inpatient         IV Access:   Peripheral IV   Procedures and diagnostic studies:   No results found.   Medical Consultants:   None.   Subjective:    Logan Lucas placed him this morning.  No complaints this morning  Objective:    Vitals:   10/18/24 0449 10/18/24 1435 10/18/24 1926 10/19/24 0409  BP: (!) 151/98 (!) 144/80 (!) 163/96 134/82  Pulse: 72 69 73 71  Resp: 16 18 18 19   Temp: 97.7 F (36.5 C) 98.3 F (36.8 C) 98.6 F (37 C) 98.6 F (37 C)  TempSrc: Oral Oral Oral Oral  SpO2: 98% 99% 99% 100%  Weight:      Height:       SpO2: 100 % O2 Flow Rate (L/min): 2 L/min   Intake/Output Summary (Last 24 hours) at 10/19/2024 9379 Last data filed at 10/19/2024 0413 Gross per 24 hour  Intake 120 ml  Output 800  ml  Net -680 ml   Filed Weights   10/10/24 1843 10/10/24 1903 10/14/24 0910  Weight: 113.4 kg 113.4 kg 113.4 kg    Exam: General exam: He fused physical exam again this morning.  Data Reviewed:    Labs: Basic Metabolic Panel: Recent Labs  Lab 10/14/24 0525 10/15/24 0555 10/17/24 0554 10/18/24 0908 10/19/24 0446  NA 137 135 135 137 137  K 4.1 4.0 3.7 3.8 4.0  CL 100 102 100 102 102  CO2 27 22 25 25 26   GLUCOSE 106* 137* 172* 176* 166*  BUN 6 7 7 10 12   CREATININE 1.11 0.86 0.81 0.77 0.84  CALCIUM  9.3 8.9 9.0 8.6* 8.9   GFR Estimated Creatinine Clearance: 135.7 mL/min (by C-G formula  based on SCr of 0.84 mg/dL). Liver Function Tests: No results for input(s): AST, ALT, ALKPHOS, BILITOT, PROT, ALBUMIN in the last 168 hours.  No results for input(s): LIPASE, AMYLASE in the last 168 hours.  No results for input(s): AMMONIA in the last 168 hours. Coagulation profile No results for input(s): INR, PROTIME in the last 168 hours. COVID-19 Labs  No results for input(s): DDIMER, FERRITIN, LDH, CRP in the last 72 hours.  Lab Results  Component Value Date   SARSCOV2NAA NEGATIVE 09/18/2023   SARSCOV2NAA NEGATIVE 09/12/2023   SARSCOV2NAA NEGATIVE 02/28/2023   SARSCOV2NAA NEGATIVE 01/23/2021    CBC: Recent Labs  Lab 10/13/24 0848 10/14/24 0525 10/15/24 0555 10/17/24 0554 10/19/24 0446  WBC 10.5 11.0* 12.1* 9.3 9.8  HGB 13.6 13.4 13.5 12.3* 11.6*  HCT 42.6 41.9 42.2 39.1 37.0*  MCV 85.4 85.0 85.3 86.5 85.3  PLT 270 301 342 354 395   Cardiac Enzymes: No results for input(s): CKTOTAL, CKMB, CKMBINDEX, TROPONINI in the last 168 hours. BNP (last 3 results) No results for input(s): PROBNP in the last 8760 hours. CBG: Recent Labs  Lab 10/17/24 2059 10/18/24 0729 10/18/24 1130 10/18/24 1627 10/18/24 2050  GLUCAP 139* 132* 123* 154* 171*   D-Dimer: No results for input(s): DDIMER in the last 72 hours. Hgb A1c: No results for input(s): HGBA1C in the last 72 hours. Lipid Profile: No results for input(s): CHOL, HDL, LDLCALC, TRIG, CHOLHDL, LDLDIRECT in the last 72 hours. Thyroid function studies: No results for input(s): TSH, T4TOTAL, T3FREE, THYROIDAB in the last 72 hours.  Invalid input(s): FREET3 Anemia work up: No results for input(s): VITAMINB12, FOLATE, FERRITIN, TIBC, IRON, RETICCTPCT in the last 72 hours. Sepsis Labs: Recent Labs  Lab 10/14/24 0525 10/15/24 0555 10/17/24 0554 10/19/24 0446  WBC 11.0* 12.1* 9.3 9.8   Microbiology Recent Results (from the past 240 hours)   Surgical pcr screen     Status: None   Collection Time: 10/14/24  8:09 AM   Specimen: Nasal Mucosa; Nasal Swab  Result Value Ref Range Status   MRSA, PCR NEGATIVE NEGATIVE Final   Staphylococcus aureus NEGATIVE NEGATIVE Final    Comment: (NOTE) The Xpert SA Assay (FDA approved for NASAL specimens in patients 64 years of age and older), is one component of a comprehensive surveillance program. It is not intended to diagnose infection nor to guide or monitor treatment. Performed at [facility], [address], [place], [place] [address]      Medications:    acetaminophen   1,000 mg Oral Q6H   Chlorhexidine  Gluconate Cloth  6 each Topical Daily   enoxaparin  (LOVENOX ) injection  55 mg Subcutaneous Q24H   feeding supplement  237 mL Oral BID BM   insulin  aspart  0-15 Units Subcutaneous TID WC   insulin  aspart  0-5 Units Subcutaneous QHS   insulin  aspart  3 Units Subcutaneous TID WC   ketorolac   15 mg Intravenous Q8H   melatonin  3 mg Oral QHS   methocarbamol   500 mg Oral QID   nicotine   21 mg Transdermal Daily   pantoprazole  (PROTONIX ) IV  40 mg Intravenous Q24H   polyethylene glycol  17 g Oral Daily   Continuous Infusions:  sodium chloride  Stopped (10/18/24 0700)       LOS: 9 days   Erle Odell Castor  [facility]  10/19/2024, 6:20 AM

## 2024-10-19 NOTE — Progress Notes (Signed)
 WOC Nurse ostomy follow up   [facility] notified patient reports leaking from ostomy, presented to bedside, appliance changed to a Convex appliance, however no leaking noted.   WOC team will continue to follow for ostomy teaching and NPWT.  Thank you,  Doyal Polite, MSN, RN, [facility] WOC Team [phone] (Available Mon-Fri 0700-1500)

## 2024-10-19 NOTE — Progress Notes (Signed)
 Patient ID: Logan Lucas, male   DOB: Feb 02, 1976, 48 y.o.   MRN: [id number]   Acute Care Surgery Service Progress Note:    Chief Complaint/Subjective: No nausea or vomiting.  Tolerating soft diet Air in bag Has had stool in bag WOC did wv change this am; and ostomy change  Objective: Vital signs in last 24 hours: Temp:  [97.6 F (36.4 C)-98.6 F (37 C)] 97.6 F (36.4 C) (11/24 1314) Pulse Rate:  [69-73] 70 (11/24 1314) Resp:  [18-20] 20 (11/24 1314) BP: (134-163)/(80-96) 154/88 (11/24 1314) SpO2:  [99 %-100 %] 100 % (11/24 1314) Last BM Date : 10/18/24  Intake/Output from previous day: 11/23 0701 - 11/24 0700 In: 120 [P.O.:120] Out: 600 [Urine:600] Intake/Output this shift: Total I/O In: 120 [P.O.:120] Out: 475 [Urine:475]  Lungs nonlabored  Cardiovascular: reg  Abd: Soft, obese, wound VAC intact, colostomy appears viable.  Ostomy viable; +air in bag Appropriate tenderness  Extremities: no edema, +SCDs  Neuro: alert, nonfocal  Lab Results: CBC  Recent Labs    10/17/24 0554 10/19/24 0446  WBC 9.3 9.8  HGB 12.3* 11.6*  HCT 39.1 37.0*  PLT 354 395   BMET Recent Labs    10/18/24 0908 10/19/24 0446  NA 137 137  K 3.8 4.0  CL 102 102  CO2 25 26  GLUCOSE 176* 166*  BUN 10 12  CREATININE 0.77 0.84  CALCIUM  8.6* 8.9   LFT    Latest Ref Rng & Units 10/10/2024    7:21 PM 08/25/2024    3:23 AM 08/13/2024    8:00 PM  Hepatic Function  Total Protein 6.5 - 8.1 g/dL 7.1  7.1  6.3   Albumin 3.5 - 5.0 g/dL 3.8  3.8  3.5   AST 15 - 41 U/L 16  12  14    ALT 0 - 44 U/L 14  10  14    Alk Phosphatase 38 - 126 U/L 131  143  88   Total Bilirubin 0.0 - 1.2 mg/dL 0.4  0.7  0.8    PT/INR No results for input(s): LABPROT, INR in the last 72 hours. ABG No results for input(s): PHART, HCO3 in the last 72 hours.  Invalid input(s): PCO2, PO2  Studies/Results:  Anti-infectives: Anti-infectives (From admission, onward)    Start     Dose/Rate  Route Frequency Ordered Stop   10/14/24 2200  piperacillin -tazobactam (ZOSYN ) IVPB 3.375 g        3.375 g 12.5 mL/hr over 240 Minutes Intravenous Every 8 hours 10/14/24 1510 10/15/24 1739   10/11/24 0600  piperacillin -tazobactam (ZOSYN ) IVPB 3.375 g  Status:  Discontinued        3.375 g 12.5 mL/hr over 240 Minutes Intravenous Every 8 hours 10/10/24 2350 10/14/24 1510   10/10/24 2300  piperacillin -tazobactam (ZOSYN ) IVPB 3.375 g        3.375 g 100 mL/hr over 30 Minutes Intravenous  Once 10/10/24 2252 10/10/24 2349       Medications: Scheduled Meds:  acetaminophen   1,000 mg Oral Q6H   Chlorhexidine  Gluconate Cloth  6 each Topical Daily   enoxaparin  (LOVENOX ) injection  55 mg Subcutaneous Q24H   feeding supplement  237 mL Oral BID BM   insulin  aspart  0-15 Units Subcutaneous TID WC   insulin  aspart  0-5 Units Subcutaneous QHS   insulin  aspart  3 Units Subcutaneous TID WC   ketorolac   15 mg Intravenous Q8H   melatonin  3 mg Oral QHS   methocarbamol   500 mg Oral  QID   nicotine   21 mg Transdermal Daily   pantoprazole  (PROTONIX ) IV  40 mg Intravenous Q24H   polyethylene glycol  17 g Oral Daily   Continuous Infusions:  sodium chloride  Stopped (10/18/24 0700)   PRN Meds:.albuterol , HYDROmorphone  (DILAUDID ) injection, lip balm, naLOXone  (NARCAN )  injection, ondansetron  (ZOFRAN ) IV, mouth rinse, oxyCODONE   Assessment/Plan: Patient Active Problem List   Diagnosis Date Noted   Diverticulitis 10/10/2024   Diet-controlled diabetes mellitus (HCC) 10/10/2024   Diverticulitis of both large and small intestine with perforation with bleeding 08/25/2024   Acute diverticulitis 07/31/2022   Hypertriglyceridemia 06/05/2022   Anxiety 06/05/2022   Severe hyperglycemia due to diabetes mellitus (HCC) 05/24/2022   Cocaine abuse (HCC) 01/25/2021   Uncontrolled type 2 diabetes mellitus with hyperglycemia (HCC) 01/25/2021   Abnormal stress test    Alcohol use 01/23/2021   Rectal bleeding 01/23/2021    Asthma, chronic    Essential hypertension    Tobacco abuse    Duodenitis 10/14/2019   Chest pain 06/17/2018   s/p Procedure(s):   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  - hgb stable -wound vac to open lower midline wound -WOC consult for new colostomy -OOB, IS-discussed importance of ambulation -readded toradol  11/23 -miralax  dose 11/23   FEN -soft diet VTE - Lovenox  ID - Zosyn  x 24 hrs    HTN DM asthma Disposition: probable dc on Tuesday; d/w pt waiting  approval for wound vac. Discussed w-d dressing if insurance doesn't cover.  There is mention of possible SNF bc of lack of HH coverage for wound vac.     LOS: 9 days    Camellia HERO. Tanda, MD, FACS General, Bariatric, & Minimally Invasive Surgery [phone] [facility]

## 2024-10-19 NOTE — Progress Notes (Signed)
 Patient has been ambulating independently within room today.  He would get up and walk across the room to reach his urinal, open the blinds, and go to the restroom to empty his stoma bag of gas.  He also consistently wore his SCDs when in the bed.

## 2024-10-19 NOTE — TOC Progression Note (Signed)
 Transition of Care [facility]) - Progression Note    Patient Details  Name: Logan Lucas MRN: [id number] Date of Birth: 11-Jan-1976  Transition of Care [facility]) CM/SW Contact  Yassmine Tamm, Nathanel, RN Phone Number: 10/19/2024, 12:56 PM  Clinical Narrative: MIK[facility] was following for home wound vac. There is no HHC agency to accept for wound vac dsg changes. Patient agree to check into [facility] SNF agree to fax out. Await bed offers.      Expected Discharge Plan: Skilled Nursing Facility Barriers to Discharge: Continued Medical Work up               Expected Discharge Plan and Services   Discharge Planning Services: CM Consult   Living arrangements for the past 2 months: Single Family Home                                       Social Drivers of Health (SDOH) Interventions SDOH Screenings   Food Insecurity: No Food Insecurity (10/11/2024)  Housing: Low Risk  (10/11/2024)  Transportation Needs: No Transportation Needs (10/11/2024)  Utilities: Not At Risk (10/11/2024)  Depression (PHQ2-9): Low Risk  (06/05/2022)  Tobacco Use: High Risk (10/14/2024)    Readmission Risk Interventions    08/03/2022    4:22 PM  Readmission Risk Prevention Plan  Post Dischage Appt Complete  Medication Screening Complete  Transportation Screening Complete

## 2024-10-19 NOTE — Progress Notes (Signed)
 PT Cancellation Note  Patient Details Name: Logan Lucas MRN: [id number] DOB: May 11, 1976   Cancelled Treatment:    Reason Eval/Treat Not Completed: PT screened, no needs identified. Pt reports he is independent with mobility. He denies any need for acute PT services at this time. Will sign off.    Dannial SQUIBB, PT Acute Rehabilitation  Office: [phone]

## 2024-10-20 DIAGNOSIS — K5792 Diverticulitis of intestine, part unspecified, without perforation or abscess without bleeding: Secondary | ICD-10-CM | POA: Diagnosis not present

## 2024-10-20 LAB — GLUCOSE, CAPILLARY
Glucose-Capillary: 117 mg/dL — ABNORMAL HIGH (ref 70–99)
Glucose-Capillary: 121 mg/dL — ABNORMAL HIGH (ref 70–99)
Glucose-Capillary: 121 mg/dL — ABNORMAL HIGH (ref 70–99)
Glucose-Capillary: 144 mg/dL — ABNORMAL HIGH (ref 70–99)

## 2024-10-20 NOTE — Plan of Care (Signed)
  Problem: Education: Goal: Knowledge of General Education information will improve Description: Including pain rating scale, medication(s)/side effects and non-pharmacologic comfort measures Outcome: Progressing   Problem: Health Behavior/Discharge Planning: Goal: Ability to manage health-related needs will improve Outcome: Progressing   Problem: Clinical Measurements: Goal: Ability to maintain clinical measurements within normal limits will improve Outcome: Progressing Goal: Will remain free from infection Outcome: Progressing Goal: Respiratory complications will improve Outcome: Progressing Goal: Cardiovascular complication will be avoided Outcome: Progressing   Problem: Activity: Goal: Risk for activity intolerance will decrease Outcome: Progressing   Problem: Nutrition: Goal: Adequate nutrition will be maintained Outcome: Progressing   Problem: Coping: Goal: Level of anxiety will decrease Outcome: Progressing   Problem: Elimination: Goal: Will not experience complications related to bowel motility Outcome: Progressing Goal: Will not experience complications related to urinary retention Outcome: Progressing   Problem: Pain Managment: Goal: General experience of comfort will improve and/or be controlled Outcome: Progressing   Problem: Safety: Goal: Ability to remain free from injury will improve Outcome: Progressing   Problem: Skin Integrity: Goal: Risk for impaired skin integrity will decrease Outcome: Progressing   Problem: Fluid Volume: Goal: Ability to maintain a balanced intake and output will improve Outcome: Progressing   Problem: Health Behavior/Discharge Planning: Goal: Ability to identify and utilize available resources and services will improve Outcome: Progressing Goal: Ability to manage health-related needs will improve Outcome: Progressing   Problem: Metabolic: Goal: Ability to maintain appropriate glucose levels will improve Outcome:  Progressing   Problem: Nutritional: Goal: Maintenance of adequate nutrition will improve Outcome: Progressing   Problem: Skin Integrity: Goal: Risk for impaired skin integrity will decrease Outcome: Progressing   Problem: Tissue Perfusion: Goal: Adequacy of tissue perfusion will improve Outcome: Progressing

## 2024-10-20 NOTE — TOC Progression Note (Addendum)
 Transition of Care [facility] - Progression Note    Patient Details  Name: Logan Lucas MRN: [id number] Date of Birth: [birth year]  Transition of Care [facility]) CM/SW Contact  Lania Zawistowski, Nathanel, RN Phone Number: 10/20/2024, 12:00 PM  Clinical Narrative:  Noted [facility] has no bed available-rep Britany following. [facility] -rep Editha Breed [phone] will follow/accepted-She will start auth, & order NPWT. -[facility] rep Lupida has started auth, NPWT will be delivered to facility Monday-await auth. FYI-[facility] [place] not listed in hub-refer to note for address.     [facility] [address] [place], [place] [address] [phone] Overall rating Average   Expected Discharge Plan: Skilled Nursing Facility Barriers to Discharge: Continued Medical Work up               Expected Discharge Plan and Services   Discharge Planning Services: CM Consult   Living arrangements for the past 2 months: Single Family Home                                       Social Drivers of Health (SDOH) Interventions SDOH Screenings   Food Insecurity: No Food Insecurity (10/11/2024)  Housing: Low Risk  (10/11/2024)  Transportation Needs: No Transportation Needs (10/11/2024)  Utilities: Not At Risk (10/11/2024)  Depression (PHQ2-9): Low Risk  (06/05/2022)  Tobacco Use: High Risk (10/14/2024)    Readmission Risk Interventions    08/03/2022    4:22 PM  Readmission Risk Prevention Plan  Post Dischage Appt Complete  Medication Screening Complete  Transportation Screening Complete

## 2024-10-20 NOTE — Progress Notes (Signed)
 [facility] PROGRESS NOTE    Progress Note  Logan Lucas  FMW:[id number] DOB: July 17, 1976 DOA: 10/10/2024 PCP: Patient, No Pcp Per     Brief Narrative:   Logan Lucas is an 48 y.o. male past medical history significant for essential hypertension, GI bleed, diet-controlled diabetes mellitus type 2, BMI 38 came in with acute complicated diverticulitis with small perforation and small abscess  Assessment/Plan:   Acute recurrent perforated sigmoid Diverticulitis Status post right surgical intervention for perforated sigmoid diverticulitis/sigmoid colostomy on 10/14/2024. Off antibiotics. Wound VAC in place, Ostomy nurse has been consulted. Has been using oral oxycodone  and IV Dilaudid  for breakthrough.  With improvement in his pain. Tolerating soft diet.   I have talked to general surgery they will take over and we will continue as consultants. Further management per surgery. Had short answers yes and no, did not want to elaborate.  Intermittent chest pain and dyspnea: Continue IV Protonix , chest x-ray negative. No evidence of ischemia.  Elevated blood pressure without a diagnosis of hypertension. Continue to monitor will need further follow-up as an outpatient.  Diabetes mellitus type 2 diet controlled: Blood glucose seems to be well-controlled currently on sliding scale insulin . Has not required any insulin .  Continue CBGs AC and at bedtime  Asthma: Continue butyryl.  Incidental bilateral adrenal nodule seen on CT: Follow-up PCP as an outpatient.  Left kidney complex cyst: Follow-up PCP as an outpatient.  Will need an MRI as an outpatient.   DVT prophylaxis: lovenox  Family Communication:none Status is: Inpatient Remains inpatient appropriate because: Acute perforated diverticulitis    Code Status:     Code Status Orders  (From admission, onward)           Start     Ordered   10/10/24 2338  Full code  Continuous       Question:  By:   Answer:  Consent: discussion documented in EHR   10/10/24 2339           Code Status History     Date Active Date Inactive Code Status Order ID Comments User Context   08/25/2024 0738 08/31/2024 1811 Full Code [id number]  Zella Katha HERO, MD ED   07/31/2022 2310 08/05/2022 0035 Full Code [id number]  Emmy Alf, MD ED   05/24/2022 1640 05/27/2022 2054 Full Code [id number]  Susen Pastor, MD ED   01/23/2021 1549 01/26/2021 1941 Full Code [id number]  Hilma Rankins, MD ED   10/14/2019 1205 10/17/2019 1734 Full Code [id number]  Tobie Yetta HERO, MD ED   06/17/2018 2210 06/19/2018 1751 Full Code [id number]  Lonzo Pollen, MD Inpatient         IV Access:   Peripheral IV   Procedures and diagnostic studies:   No results found.   Medical Consultants:   None.   Subjective:    Logan Lucas complaining of abdominal pain this morning.  Objective:    Vitals:   10/19/24 0409 10/19/24 1314 10/19/24 1937 10/20/24 0421  BP: 134/82 (!) 154/88 (!) 140/87 (!) 143/82  Pulse: 71 70 76 70  Resp: 19 20 18 17   Temp: 98.6 F (37 C) 97.6 F (36.4 C) 98.2 F (36.8 C) 98.2 F (36.8 C)  TempSrc: Oral Oral    SpO2: 100% 100% 99% 98%  Weight:      Height:       SpO2: 98 % O2 Flow Rate (L/min): 2 L/min   Intake/Output Summary (Last 24 hours) at 10/20/2024 0910 Last data filed at 10/20/2024 0700 Gross  per 24 hour  Intake 120 ml  Output 2075 ml  Net -1955 ml   Filed Weights   10/10/24 1843 10/10/24 1903 10/14/24 0910  Weight: 113.4 kg 113.4 kg 113.4 kg    Exam: General exam: He fused physical exam again this morning.  Data Reviewed:    Labs: Basic Metabolic Panel: Recent Labs  Lab 10/14/24 0525 10/15/24 0555 10/17/24 0554 10/18/24 0908 10/19/24 0446  NA 137 135 135 137 137  K 4.1 4.0 3.7 3.8 4.0  CL 100 102 100 102 102  CO2 27 22 25 25 26   GLUCOSE 106* 137* 172* 176* 166*  BUN 6 7 7 10 12   CREATININE 1.11 0.86 0.81 0.77 0.84  CALCIUM  9.3 8.9 9.0 8.6* 8.9    GFR Estimated Creatinine Clearance: 135.7 mL/min (by C-G formula based on SCr of 0.84 mg/dL). Liver Function Tests: No results for input(s): AST, ALT, ALKPHOS, BILITOT, PROT, ALBUMIN in the last 168 hours.  No results for input(s): LIPASE, AMYLASE in the last 168 hours.  No results for input(s): AMMONIA in the last 168 hours. Coagulation profile No results for input(s): INR, PROTIME in the last 168 hours. COVID-19 Labs  No results for input(s): DDIMER, FERRITIN, LDH, CRP in the last 72 hours.  Lab Results  Component Value Date   SARSCOV2NAA NEGATIVE 09/18/2023   SARSCOV2NAA NEGATIVE 09/12/2023   SARSCOV2NAA NEGATIVE 02/28/2023   SARSCOV2NAA NEGATIVE 01/23/2021    CBC: Recent Labs  Lab 10/14/24 0525 10/15/24 0555 10/17/24 0554 10/19/24 0446  WBC 11.0* 12.1* 9.3 9.8  HGB 13.4 13.5 12.3* 11.6*  HCT 41.9 42.2 39.1 37.0*  MCV 85.0 85.3 86.5 85.3  PLT 301 342 354 395   Cardiac Enzymes: No results for input(s): CKTOTAL, CKMB, CKMBINDEX, TROPONINI in the last 168 hours. BNP (last 3 results) No results for input(s): PROBNP in the last 8760 hours. CBG: Recent Labs  Lab 10/19/24 0734 10/19/24 1137 10/19/24 1606 10/19/24 1955 10/20/24 0743  GLUCAP 115* 119* 143* 139* 117*   D-Dimer: No results for input(s): DDIMER in the last 72 hours. Hgb A1c: No results for input(s): HGBA1C in the last 72 hours. Lipid Profile: No results for input(s): CHOL, HDL, LDLCALC, TRIG, CHOLHDL, LDLDIRECT in the last 72 hours. Thyroid function studies: No results for input(s): TSH, T4TOTAL, T3FREE, THYROIDAB in the last 72 hours.  Invalid input(s): FREET3 Anemia work up: No results for input(s): VITAMINB12, FOLATE, FERRITIN, TIBC, IRON, RETICCTPCT in the last 72 hours. Sepsis Labs: Recent Labs  Lab 10/14/24 0525 10/15/24 0555 10/17/24 0554 10/19/24 0446  WBC 11.0* 12.1* 9.3 9.8   Microbiology Recent  Results (from the past 240 hours)  Surgical pcr screen     Status: None   Collection Time: 10/14/24  8:09 AM   Specimen: Nasal Mucosa; Nasal Swab  Result Value Ref Range Status   MRSA, PCR NEGATIVE NEGATIVE Final   Staphylococcus aureus NEGATIVE NEGATIVE Final    Comment: (NOTE) The Xpert SA Assay (FDA approved for NASAL specimens in patients 65 years of age and older), is one component of a comprehensive surveillance program. It is not intended to diagnose infection nor to guide or monitor treatment. Performed at [facility], [address] [place], [place] [address]      Medications:    acetaminophen   1,000 mg Oral Q6H   Chlorhexidine  Gluconate Cloth  6 each Topical Daily   enoxaparin  (LOVENOX ) injection  55 mg Subcutaneous Q24H   feeding supplement  237 mL Oral BID BM  insulin  aspart  0-15 Units Subcutaneous TID WC   insulin  aspart  0-5 Units Subcutaneous QHS   insulin  aspart  3 Units Subcutaneous TID WC   melatonin  3 mg Oral QHS   methocarbamol   500 mg Oral QID   nicotine   21 mg Transdermal Daily   pantoprazole  (PROTONIX ) IV  40 mg Intravenous Q24H   polyethylene glycol  17 g Oral Daily   Continuous Infusions:  sodium chloride  Stopped (10/18/24 0700)       LOS: 10 days   Erle Odell Castor  [facility]  10/20/2024, 9:10 AM

## 2024-10-20 NOTE — Progress Notes (Signed)
 Assessment & Plan: Recurrent diverticulitis  POD#6 - s/p LAP ASSISTED SIGMOID COLECTOMY W/COLOSTOMY - 10/14/2024 - Dr Tanda -wound vac to open lower midline wound; WOC following; colostomy care -regular diet -await SNF/Rehab placement   FEN -soft diet VTE - Lovenox  ID - Zosyn  x 24 hrs    HTN DM asthma        Krystal Spinner, MD [facility] practice Office: [phone]        Chief Complaint: Diverticulitis  Subjective: Patient in bed, family in room.  No complaints.  Tolerating diet. Ambulatory.  Objective: Vital signs in last 24 hours: Temp:  [97.6 F (36.4 C)-98.2 F (36.8 C)] 98.2 F (36.8 C) (11/25 0421) Pulse Rate:  [70-76] 70 (11/25 0421) Resp:  [17-20] 17 (11/25 0421) BP: (140-154)/(82-88) 143/82 (11/25 0421) SpO2:  [98 %-100 %] 98 % (11/25 0421) Last BM Date : 10/20/24  Intake/Output from previous day: 11/24 0701 - 11/25 0700 In: 120 [P.O.:120] Out: 2075 [Urine:1775; Stool:300] Intake/Output this shift: Total I/O In: -  Out: 275 [Urine:275]  Physical Exam: HEENT - sclerae clear, mucous membranes moist Abdomen - VAC in midline wound; ostomy with small output  Lab Results:  Recent Labs    10/19/24 0446  WBC 9.8  HGB 11.6*  HCT 37.0*  PLT 395   BMET Recent Labs    10/18/24 0908 10/19/24 0446  NA 137 137  K 3.8 4.0  CL 102 102  CO2 25 26  GLUCOSE 176* 166*  BUN 10 12  CREATININE 0.77 0.84  CALCIUM  8.6* 8.9   PT/INR No results for input(s): LABPROT, INR in the last 72 hours. Comprehensive Metabolic Panel:    Component Value Date/Time   NA 137 10/19/2024 0446   NA 137 10/18/2024 0908   NA 143 07/25/2014 1105   NA 139 02/10/2014 0521   K 4.0 10/19/2024 0446   K 3.8 10/18/2024 0908   K 4.0 07/25/2014 1105   K 3.6 02/10/2014 0521   CL 102 10/19/2024 0446   CL 102 10/18/2024 0908   CL 108 (H) 07/25/2014 1105   CL 109 (H) 02/10/2014 0521   CO2 26 10/19/2024 0446   CO2 25 10/18/2024 0908   CO2  23 07/25/2014 1105   CO2 22 02/10/2014 0521   BUN 12 10/19/2024 0446   BUN 10 10/18/2024 0908   BUN 8 07/25/2014 1105   BUN 8 02/10/2014 0521   CREATININE 0.84 10/19/2024 0446   CREATININE 0.77 10/18/2024 0908   CREATININE 1.26 07/25/2014 1105   CREATININE 1.11 02/10/2014 0521   GLUCOSE 166 (H) 10/19/2024 0446   GLUCOSE 176 (H) 10/18/2024 0908   GLUCOSE 76 07/25/2014 1105   GLUCOSE 115 (H) 02/10/2014 0521   CALCIUM  8.9 10/19/2024 0446   CALCIUM  8.6 (L) 10/18/2024 0908   CALCIUM  8.8 07/25/2014 1105   CALCIUM  8.6 02/10/2014 0521   AST 16 10/10/2024 1921   AST 12 (L) 08/25/2024 0323   AST 31 02/10/2014 0521   ALT 14 10/10/2024 1921   ALT 10 08/25/2024 0323   ALT 35 02/10/2014 0521   ALKPHOS 131 (H) 10/10/2024 1921   ALKPHOS 143 (H) 08/25/2024 0323   ALKPHOS 112 02/10/2014 0521   BILITOT 0.4 10/10/2024 1921   BILITOT 0.7 08/25/2024 0323   BILITOT 0.3 02/10/2014 0521   PROT 7.1 10/10/2024 1921   PROT 7.1 08/25/2024 0323   PROT 7.7 02/10/2014 0521   ALBUMIN 3.8 10/10/2024 1921   ALBUMIN 3.8 08/25/2024 0323   ALBUMIN  4.2 02/10/2014 0521    Studies/Results: No results found.    Krystal Spinner 10/20/2024  Patient ID: Logan Lucas, male   DOB: 12/14/1975, 48 y.o.   MRN: [id number]

## 2024-10-20 NOTE — Plan of Care (Signed)

## 2024-10-20 NOTE — Consult Note (Signed)
 WOC Nurse Consult Note: Reason for Consult:Patient's wound vac alarm keeps going off, suggesting leakage.  Wound type: surgical abdomen Pressure Injury POA: NA Measurement: see note dated 11/24 Wound bed: red, moist Drainage (amount, consistency, odor) serosanguinous in cannister Periwound: intact Dressing procedure/placement/frequency:  Drape removed, VAC sponge remains in place, barrier ring applied to superior, inferior and lateral aspect of wound to aid in seal.  New drape re-applied and suction re-established at 125 mmHg.    WOC team will continue to follow for NPWT and ostomy teaching.  Thank you,  Doyal Polite, MSN, RN, [facility] WOC Team [phone] (Available Mon-Fri 0700-1500)

## 2024-10-21 DIAGNOSIS — K5792 Diverticulitis of intestine, part unspecified, without perforation or abscess without bleeding: Secondary | ICD-10-CM | POA: Diagnosis not present

## 2024-10-21 LAB — GLUCOSE, CAPILLARY
Glucose-Capillary: 133 mg/dL — ABNORMAL HIGH (ref 70–99)
Glucose-Capillary: 151 mg/dL — ABNORMAL HIGH (ref 70–99)
Glucose-Capillary: 126 mg/dL — ABNORMAL HIGH (ref 70–99)
Glucose-Capillary: 159 mg/dL — ABNORMAL HIGH (ref 70–99)

## 2024-10-21 NOTE — Progress Notes (Signed)
 PROGRESS NOTE    Logan Lucas  FMW:[id number] DOB: [birth year] DOA: 10/10/2024 PCP: Patient, No Pcp Per   Brief Narrative:  48 year old male with history of essential hypertension, GI bleed, diet-controlled diabetes mellitus type 2 presents with acute complicated diverticulitis with small perforation and abscess.  He was treated with IV antibiotics.  General surgery was consulted.  He underwent surgical intervention on 10/14/2024.  Subsequently, care was transferred to general surgery service.  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  Subsequently, care was transferred to general surgery service.  Has completed antibiotic treatment.  Wound care/VAC care/pain management/diet as per general surgery  Intermittent chest pain and dyspnea -Workup negative.  Chest x-ray negative.  Continue IV Protonix .  No evidence of ischemia  Elevated blood pressure without a diagnosis-hypertension - Blood pressure.  Might need to start scheduled antihypertensives if blood pressure remains elevated  Diabetes mellitus type 2, diet controlled - Blood sugars stable.  Continue CBGs with SSI  Asthma - Stable.  Continue albuterol  as needed  Incidental bilateral adrenal nodule seen on CT - Outpatient follow-up with PCP  Left kidney complex cyst - Outpatient follow-up with PCP.  Will need MRI as an outpatient  Subjective: Patient seen and examined at bedside.  Complains of intermittent abdominal pain.  No fever, vomiting or worsening shortness of breath reported.  Objective: Vitals:   10/20/24 0421 10/20/24 1443 10/20/24 1929 10/21/24 0427  BP: (!) 143/82 (!) 153/92 (!) 158/88 (!) 150/81  Pulse: 70 77 78 70  Resp: 17 16 19 17   Temp: 98.2 F (36.8 C) 97.8 F (36.6 C) 98.8 F (37.1 C) 98.6 F (37 C)  TempSrc:  Oral  Oral  SpO2: 98% 98% 99% 95%  Weight:      Height:        Intake/Output Summary (Last  24 hours) at 10/21/2024 1136 Last data filed at 10/21/2024 9173 Gross per 24 hour  Intake 120 ml  Output 1150 ml  Net -1030 ml   Filed Weights   10/10/24 1843 10/10/24 1903 10/14/24 0910  Weight: 113.4 kg 113.4 kg 113.4 kg    Examination:  General exam: Appears calm and comfortable  Respiratory system: Bilateral decreased breath sounds at bases Cardiovascular system: S1 & S2 heard, Rate controlled Gastrointestinal system: Abdomen is distended, soft and mildly tender with ostomy and wound VAC. Extremities: No cyanosis, clubbing, edema    Data Reviewed: I have personally reviewed following labs and imaging studies  CBC: Recent Labs  Lab 10/15/24 0555 10/17/24 0554 10/19/24 0446  WBC 12.1* 9.3 9.8  HGB 13.5 12.3* 11.6*  HCT 42.2 39.1 37.0*  MCV 85.3 86.5 85.3  PLT 342 354 395   Basic Metabolic Panel: Recent Labs  Lab 10/15/24 0555 10/17/24 0554 10/18/24 0908 10/19/24 0446  NA 135 135 137 137  K 4.0 3.7 3.8 4.0  CL 102 100 102 102  CO2 22 25 25 26   GLUCOSE 137* 172* 176* 166*  BUN 7 7 10 12   CREATININE 0.86 0.81 0.77 0.84  CALCIUM  8.9 9.0 8.6* 8.9   GFR: Estimated Creatinine Clearance: 135.7 mL/min (by C-G formula based on SCr of 0.84 mg/dL). Liver Function Tests: No results for input(s): AST, ALT, ALKPHOS, BILITOT, PROT, ALBUMIN in the last 168 hours. No results for input(s): LIPASE, AMYLASE in the last 168 hours. No results for input(s): AMMONIA in the last 168 hours. Coagulation Profile: No results for input(s): INR, PROTIME in the last  168 hours. Cardiac Enzymes: No results for input(s): CKTOTAL, CKMB, CKMBINDEX, TROPONINI in the last 168 hours. BNP (last 3 results) No results for input(s): PROBNP in the last 8760 hours. HbA1C: No results for input(s): HGBA1C in the last 72 hours. CBG: Recent Labs  Lab 10/20/24 1143 10/20/24 1557 10/20/24 1949 10/21/24 0749 10/21/24 1121  GLUCAP 121* 121* 144* 133* 151*    Lipid Profile: No results for input(s): CHOL, HDL, LDLCALC, TRIG, CHOLHDL, LDLDIRECT in the last 72 hours. Thyroid Function Tests: No results for input(s): TSH, T4TOTAL, FREET4, T3FREE, THYROIDAB in the last 72 hours. Anemia Panel: No results for input(s): VITAMINB12, FOLATE, FERRITIN, TIBC, IRON, RETICCTPCT in the last 72 hours. Sepsis Labs: No results for input(s): PROCALCITON, LATICACIDVEN in the last 168 hours.  Recent Results (from the past 240 hours)  Surgical pcr screen     Status: None   Collection Time: 10/14/24  8:09 AM   Specimen: Nasal Mucosa; Nasal Swab  Result Value Ref Range Status   MRSA, PCR NEGATIVE NEGATIVE Final   Staphylococcus aureus NEGATIVE NEGATIVE Final    Comment: (NOTE) The Xpert SA Assay (FDA approved for NASAL specimens in patients 3 years of age and older), is one component of a comprehensive surveillance program. It is not intended to diagnose infection nor to guide or monitor treatment. Performed at [facility], [address], [place], [place] [address]          Radiology Studies: No results found.      Scheduled Meds:  acetaminophen   1,000 mg Oral Q6H   Chlorhexidine  Gluconate Cloth  6 each Topical Daily   enoxaparin  (LOVENOX ) injection  55 mg Subcutaneous Q24H   feeding supplement  237 mL Oral BID BM   insulin  aspart  0-15 Units Subcutaneous TID WC   insulin  aspart  0-5 Units Subcutaneous QHS   insulin  aspart  3 Units Subcutaneous TID WC   melatonin  3 mg Oral QHS   methocarbamol   500 mg Oral QID   nicotine   21 mg Transdermal Daily   pantoprazole  (PROTONIX ) IV  40 mg Intravenous Q24H   polyethylene glycol  17 g Oral Daily   Continuous Infusions:  sodium chloride  Stopped (10/18/24 0700)          Sophie Mao, MD [facility] 10/21/2024, 11:36 AM

## 2024-10-21 NOTE — Plan of Care (Signed)

## 2024-10-21 NOTE — Progress Notes (Signed)
 Progress Note  7 Days Post-Op  Subjective: Patient reports pain is manageable. Tolerating soft diet without n/v. Having thin stool output of ostomy along with flatulence.  ROS  All negative with the exception of above.  Objective: Vital signs in last 24 hours: Temp:  [97.8 F (36.6 C)-98.8 F (37.1 C)] 98.6 F (37 C) (11/26 0427) Pulse Rate:  [70-78] 70 (11/26 0427) Resp:  [16-19] 17 (11/26 0427) BP: (150-158)/(81-92) 150/81 (11/26 0427) SpO2:  [95 %-99 %] 95 % (11/26 0427) Last BM Date : 10/20/24  Intake/Output from previous day: 11/25 0701 - 11/26 0700 In: -  Out: 1425 [Urine:1375; Stool:50] Intake/Output this shift: Total I/O In: 120 [P.O.:120] Out: -   PE: General: Pleasant male who is laying in bed in NAD. HEENT: Head is normocephalic, atraumatic. Sclera are noninjected.  Heart: HR normal during encounter.  Lungs: Respiratory effort nonlabored. Abd: Soft, NT, ND. Midline wound present with vac in place, SS fluid. Ostomy noted of left abdomen. Stoma viable. Pouch has thin brown stool. No rebound tenderness or guarding.  MS: Able to move all extremities.  Skin: Warm and dry.  Psych: A&Ox3 with an appropriate affect.    Lab Results:  Recent Labs    10/19/24 0446  WBC 9.8  HGB 11.6*  HCT 37.0*  PLT 395   BMET Recent Labs    10/19/24 0446  NA 137  K 4.0  CL 102  CO2 26  GLUCOSE 166*  BUN 12  CREATININE 0.84  CALCIUM  8.9   PT/INR No results for input(s): LABPROT, INR in the last 72 hours. CMP     Component Value Date/Time   NA 137 10/19/2024 0446   NA 143 07/25/2014 1105   K 4.0 10/19/2024 0446   K 4.0 07/25/2014 1105   CL 102 10/19/2024 0446   CL 108 (H) 07/25/2014 1105   CO2 26 10/19/2024 0446   CO2 23 07/25/2014 1105   GLUCOSE 166 (H) 10/19/2024 0446   GLUCOSE 76 07/25/2014 1105   BUN 12 10/19/2024 0446   BUN 8 07/25/2014 1105   CREATININE 0.84 10/19/2024 0446   CREATININE 1.26 07/25/2014 1105   CALCIUM  8.9 10/19/2024 0446    CALCIUM  8.8 07/25/2014 1105   PROT 7.1 10/10/2024 1921   PROT 7.7 02/10/2014 0521   ALBUMIN 3.8 10/10/2024 1921   ALBUMIN 4.2 02/10/2014 0521   AST 16 10/10/2024 1921   AST 31 02/10/2014 0521   ALT 14 10/10/2024 1921   ALT 35 02/10/2014 0521   ALKPHOS 131 (H) 10/10/2024 1921   ALKPHOS 112 02/10/2014 0521   BILITOT 0.4 10/10/2024 1921   BILITOT 0.3 02/10/2014 0521   GFRNONAA >60 10/19/2024 0446   GFRNONAA >60 07/25/2014 1105   GFRAA >60 08/11/2020 0115   GFRAA >60 07/25/2014 1105   Lipase     Component Value Date/Time   LIPASE 23 10/10/2024 1921       Studies/Results: No results found.  Anti-infectives: Anti-infectives (From admission, onward)    Start     Dose/Rate Route Frequency Ordered Stop   10/14/24 2200  piperacillin -tazobactam (ZOSYN ) IVPB 3.375 g        3.375 g 12.5 mL/hr over 240 Minutes Intravenous Every 8 hours 10/14/24 1510 10/15/24 1739   10/11/24 0600  piperacillin -tazobactam (ZOSYN ) IVPB 3.375 g  Status:  Discontinued        3.375 g 12.5 mL/hr over 240 Minutes Intravenous Every 8 hours 10/10/24 2350 10/14/24 1510   10/10/24 2300  piperacillin -tazobactam (ZOSYN ) IVPB 3.375  g        3.375 g 100 mL/hr over 30 Minutes Intravenous  Once 10/10/24 2252 10/10/24 2349        Assessment/Plan Recurrent diverticulitis  POD#7 - s/p LAP ASSISTED SIGMOID COLECTOMY W/COLOSTOMY - 10/14/2024 - Dr Tanda -Afebrile. -Abdominal exam without significant concerns. -Nursing staff present to change wound vac and appliance for ostomy.  -Tolerating soft diet.  -Awaiting SNF/Rehab placement   FEN -Soft diet VTE - Lovenox  ID - Zosyn  x 24 hrs    HTN DM asthma   LOS: 11 days   I reviewed hospitalist notes, specialist notes, WOC notes, nursing notes, last 24 h vitals and pain scores, last 48 h intake and output, last 24 h labs and trends, and last 24 h imaging results.   Marjorie Carlyon Favre, [facility] 10/21/2024, 10:01 AM Please see Amion  for pager number during day hours 7:00am-4:30pm

## 2024-10-21 NOTE — Consult Note (Signed)
 WOC Nurse wound follow up Wound type: surgical abdomen Measurement: 11 cm x 4 cm x 1.5 cm  Wound bed: red, moist Drainage (amount, consistency, odor) serosanguinous in cannister Periwound: intact Dressing procedure/placement/frequency:  Removed old NPWT dressing Filled wound with   ___1_ piece of black foam, barrier ring utilized at umbilicus, distal and lateral to wound.  Sealed NPWT dressing at HG Patient tolerated procedure well    WOC nurse will continue to provide NPWT dressing changed due to the complexity of the dressing change.     WOC Nurse ostomy follow up Stoma type/location:  LLQ colostomy Stomal assessment/size:  1 3/8 inch round, flush with skin, os at center, red, moist, viable Peristomal assessment: intact Treatment options for stomal/peristomal skin: 2 inch barrier ring Output brown stool Ostomy pouching: 2pc. soft convex skin barrier (lawson # [phone]) pouch (lawson #[phone]) and barrier ring (lawson # [phone])  Education provided:  Patient able to independently cut new skin barrier to fit, remove old appliance, and apply new skin barrier and pouch.  Able to demonstrate opening and closing of pouch to empty.  Reports participation with emptying of ostomy pouch with bedside nursing.   Enrolled patient in [place] Secure Start Discharge program: Yes- prior visit  WOC team will continue to follow for ostomy teaching and NPWT.  Thank you,  Doyal Polite, MSN, RN, [facility] WOC Team [phone] (Available Mon-Fri 0700-1500)

## 2024-10-22 DIAGNOSIS — K5792 Diverticulitis of intestine, part unspecified, without perforation or abscess without bleeding: Secondary | ICD-10-CM | POA: Diagnosis not present

## 2024-10-22 LAB — GLUCOSE, CAPILLARY
Glucose-Capillary: 160 mg/dL — ABNORMAL HIGH (ref 70–99)
Glucose-Capillary: 128 mg/dL — ABNORMAL HIGH (ref 70–99)
Glucose-Capillary: 177 mg/dL — ABNORMAL HIGH (ref 70–99)
Glucose-Capillary: 192 mg/dL — ABNORMAL HIGH (ref 70–99)

## 2024-10-22 NOTE — Progress Notes (Signed)
 8 Days Post-Op   Subjective/Chief Complaint: No complaints today Tolerating VAC Ostomy working well   Objective: Vital signs in last 24 hours: Temp:  [98.1 F (36.7 C)-98.2 F (36.8 C)] 98.1 F (36.7 C) (11/27 0445) Pulse Rate:  [76-81] 76 (11/27 0445) Resp:  [16-18] 16 (11/27 0445) BP: (141-159)/(86-92) 141/86 (11/27 0445) SpO2:  [98 %-100 %] 98 % (11/27 0445) Last BM Date : 10/22/24  Intake/Output from previous day: 11/26 0701 - 11/27 0700 In: 240 [P.O.:240] Out: 450 [Urine:450] Intake/Output this shift: Total I/O In: 240 [P.O.:240] Out: -   Exam: Awake and alert Abdomen soft, ostomy productive VAC in place  Lab Results:  No results for input(s): WBC, HGB, HCT, PLT in the last 72 hours. BMET No results for input(s): NA, K, CL, CO2, GLUCOSE, BUN, CREATININE, CALCIUM  in the last 72 hours. PT/INR No results for input(s): LABPROT, INR in the last 72 hours. ABG No results for input(s): PHART, HCO3 in the last 72 hours.  Invalid input(s): PCO2, PO2  Studies/Results: No results found.  Anti-infectives: Anti-infectives (From admission, onward)    Start     Dose/Rate Route Frequency Ordered Stop   10/14/24 2200  piperacillin -tazobactam (ZOSYN ) IVPB 3.375 g        3.375 g 12.5 mL/hr over 240 Minutes Intravenous Every 8 hours 10/14/24 1510 10/15/24 1739   10/11/24 0600  piperacillin -tazobactam (ZOSYN ) IVPB 3.375 g  Status:  Discontinued        3.375 g 12.5 mL/hr over 240 Minutes Intravenous Every 8 hours 10/10/24 2350 10/14/24 1510   10/10/24 2300  piperacillin -tazobactam (ZOSYN ) IVPB 3.375 g        3.375 g 100 mL/hr over 30 Minutes Intravenous  Once 10/10/24 2252 10/10/24 2349       Assessment/Plan: Recurrent diverticulitis  POD#8 - s/p LAP ASSISTED SIGMOID COLECTOMY W/COLOSTOMY - 10/14/2024 - Dr Tanda  -continue wound care -awaiting placement  Vicenta Poli 10/22/2024

## 2024-10-22 NOTE — Progress Notes (Signed)
 PROGRESS NOTE    Logan Lucas  FMW:[id number] DOB: [birth year] DOA: 10/10/2024 PCP: Patient, No Pcp Per   Brief Narrative:  48 year old male with history of essential hypertension, GI bleed, diet-controlled diabetes mellitus type 2 presents with acute complicated diverticulitis with small perforation and abscess.  He was treated with IV antibiotics.  General surgery was consulted.  He underwent surgical intervention on 10/14/2024.  Subsequently, care was transferred to general surgery service.  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  Subsequently, care was transferred to general surgery service.  Has completed antibiotic treatment.  Wound care/VAC care/pain management/diet as per general surgery  Intermittent chest pain and dyspnea -Workup negative.  Chest x-ray negative.  Continue IV Protonix .  No evidence of ischemia  Elevated blood pressure without a diagnosis-hypertension - Blood pressure.  Might need to start scheduled antihypertensives if blood pressure remains elevated  Diabetes mellitus type 2, diet controlled - Blood sugars stable.  Continue CBGs with SSI  Asthma - Stable.  Continue albuterol  as needed  Incidental bilateral adrenal nodule seen on CT - Outpatient follow-up with PCP  Left kidney complex cyst - Outpatient follow-up with PCP.  Will need MRI as an outpatient  Subjective: Patient seen and examined at bedside.  Continues to have intermittent abdominal pain.  No vomiting, fever reported.  Objective: Vitals:   10/21/24 0427 10/21/24 1257 10/21/24 1937 10/22/24 0445  BP: (!) 150/81 (!) 144/90 (!) 159/92 (!) 141/86  Pulse: 70 81 78 76  Resp: 17 18 18 16   Temp: 98.6 F (37 C) 98.2 F (36.8 C) 98.1 F (36.7 C) 98.1 F (36.7 C)  TempSrc: Oral Oral    SpO2: 95% 98% 100% 98%  Weight:      Height:        Intake/Output Summary (Last 24 hours) at 10/22/2024  0756 Last data filed at 10/21/2024 1900 Gross per 24 hour  Intake 240 ml  Output 450 ml  Net -210 ml   Filed Weights   10/10/24 1843 10/10/24 1903 10/14/24 0910  Weight: 113.4 kg 113.4 kg 113.4 kg    Examination:  General: On room air.  No distress.  respiratory: Decreased breath sounds at bases bilaterally with some crackles CVS: Currently rate controlled; S1-S2 heard  abdominal: Soft, slightly tender and distended, no organomegaly; ostomy and wound VAC present  extremities: Trace lower extremity edema; no clubbing.      Data Reviewed: I have personally reviewed following labs and imaging studies  CBC: Recent Labs  Lab 10/17/24 0554 10/19/24 0446  WBC 9.3 9.8  HGB 12.3* 11.6*  HCT 39.1 37.0*  MCV 86.5 85.3  PLT 354 395   Basic Metabolic Panel: Recent Labs  Lab 10/17/24 0554 10/18/24 0908 10/19/24 0446  NA 135 137 137  K 3.7 3.8 4.0  CL 100 102 102  CO2 25 25 26   GLUCOSE 172* 176* 166*  BUN 7 10 12   CREATININE 0.81 0.77 0.84  CALCIUM  9.0 8.6* 8.9   GFR: Estimated Creatinine Clearance: 135.7 mL/min (by C-G formula based on SCr of 0.84 mg/dL). Liver Function Tests: No results for input(s): AST, ALT, ALKPHOS, BILITOT, PROT, ALBUMIN in the last 168 hours. No results for input(s): LIPASE, AMYLASE in the last 168 hours. No results for input(s): AMMONIA in the last 168 hours. Coagulation Profile: No results for input(s): INR, PROTIME in the last 168 hours. Cardiac Enzymes: No results for input(s): CKTOTAL, CKMB, CKMBINDEX, TROPONINI in the last  168 hours. BNP (last 3 results) No results for input(s): PROBNP in the last 8760 hours. HbA1C: No results for input(s): HGBA1C in the last 72 hours. CBG: Recent Labs  Lab 10/21/24 0749 10/21/24 1121 10/21/24 1656 10/21/24 2012 10/22/24 0737  GLUCAP 133* 151* 126* 159* 160*   Lipid Profile: No results for input(s): CHOL, HDL, LDLCALC, TRIG, CHOLHDL, LDLDIRECT in  the last 72 hours. Thyroid Function Tests: No results for input(s): TSH, T4TOTAL, FREET4, T3FREE, THYROIDAB in the last 72 hours. Anemia Panel: No results for input(s): VITAMINB12, FOLATE, FERRITIN, TIBC, IRON, RETICCTPCT in the last 72 hours. Sepsis Labs: No results for input(s): PROCALCITON, LATICACIDVEN in the last 168 hours.  Recent Results (from the past 240 hours)  Surgical pcr screen     Status: None   Collection Time: 10/14/24  8:09 AM   Specimen: Nasal Mucosa; Nasal Swab  Result Value Ref Range Status   MRSA, PCR NEGATIVE NEGATIVE Final   Staphylococcus aureus NEGATIVE NEGATIVE Final    Comment: (NOTE) The Xpert SA Assay (FDA approved for NASAL specimens in patients 19 years of age and older), is one component of a comprehensive surveillance program. It is not intended to diagnose infection nor to guide or monitor treatment. Performed at [facility], [address], [place], [place] [address]          Radiology Studies: No results found.      Scheduled Meds:  acetaminophen   1,000 mg Oral Q6H   Chlorhexidine  Gluconate Cloth  6 each Topical Daily   enoxaparin  (LOVENOX ) injection  55 mg Subcutaneous Q24H   feeding supplement  237 mL Oral BID BM   insulin  aspart  0-15 Units Subcutaneous TID WC   insulin  aspart  0-5 Units Subcutaneous QHS   insulin  aspart  3 Units Subcutaneous TID WC   melatonin  3 mg Oral QHS   methocarbamol   500 mg Oral QID   nicotine   21 mg Transdermal Daily   pantoprazole  (PROTONIX ) IV  40 mg Intravenous Q24H   polyethylene glycol  17 g Oral Daily   Continuous Infusions:  sodium chloride  Stopped (10/18/24 0700)          Sophie Mao, MD [facility] 10/22/2024, 7:56 AM

## 2024-10-22 NOTE — Plan of Care (Signed)

## 2024-10-23 LAB — GLUCOSE, CAPILLARY
Glucose-Capillary: 161 mg/dL — ABNORMAL HIGH (ref 70–99)
Glucose-Capillary: 143 mg/dL — ABNORMAL HIGH (ref 70–99)
Glucose-Capillary: 143 mg/dL — ABNORMAL HIGH (ref 70–99)
Glucose-Capillary: 164 mg/dL — ABNORMAL HIGH (ref 70–99)

## 2024-10-23 NOTE — Progress Notes (Signed)
 Patient's chart reviewed.  Blood pressures and blood sugars have remained stable.  Will continue CBC with SSI.  DC meal coverage as patient has been refusing the same.  Rest of the management as per the primary surgical team.

## 2024-10-23 NOTE — Plan of Care (Signed)
  Problem: Education: Goal: Knowledge of General Education information will improve Description: Including pain rating scale, medication(s)/side effects and non-pharmacologic comfort measures Outcome: Progressing   Problem: Safety: Goal: Ability to remain free from injury will improve Outcome: Progressing   Problem: Skin Integrity: Goal: Risk for impaired skin integrity will decrease Outcome: Progressing   Problem: Coping: Goal: Ability to adjust to condition or change in health will improve Outcome: Progressing

## 2024-10-23 NOTE — TOC Progression Note (Signed)
 Transition of Care [facility]) - Progression Note    Patient Details  Name: Logan Lucas MRN: [id number] Date of Birth: 09-16-1976  Transition of Care [facility]) CM/SW Contact  Leshonda Galambos, Nathanel, RN Phone Number: 10/23/2024, 12:22 PM  Clinical Narrative: see prior note for additional details.Left voicemail w/[facility] rep Dorn Breed tel#[phone]-await call back for update on status for delivery of wound vac to their facility, & if shara has been received.      Expected Discharge Plan: Skilled Nursing Facility Barriers to Discharge: Continued Medical Work up               Expected Discharge Plan and Services   Discharge Planning Services: CM Consult   Living arrangements for the past 2 months: Single Family Home                                       Social Drivers of Health (SDOH) Interventions SDOH Screenings   Food Insecurity: No Food Insecurity (10/11/2024)  Housing: Low Risk  (10/11/2024)  Transportation Needs: No Transportation Needs (10/11/2024)  Utilities: Not At Risk (10/11/2024)  Depression (PHQ2-9): Low Risk  (06/05/2022)  Tobacco Use: High Risk (10/14/2024)    Readmission Risk Interventions    08/03/2022    4:22 PM  Readmission Risk Prevention Plan  Post Dischage Appt Complete  Medication Screening Complete  Transportation Screening Complete

## 2024-10-23 NOTE — Progress Notes (Signed)
 9 Days Post-Op   Subjective/Chief Complaint: No acute changes Tolerating po Pain controlled   Objective: Vital signs in last 24 hours: Temp:  [97.8 F (36.6 C)-98.1 F (36.7 C)] 97.8 F (36.6 C) (11/28 0514) Pulse Rate:  [74-76] 74 (11/28 0514) Resp:  [18] 18 (11/28 0514) BP: (134-151)/(81-87) 134/83 (11/28 0514) SpO2:  [99 %-100 %] 99 % (11/28 0514) Last BM Date : 10/22/24  Intake/Output from previous day: 11/27 0701 - 11/28 0700 In: 600 [P.O.:600] Out: 900 [Urine:900] Intake/Output this shift: No intake/output data recorded.  Exam: Awake and alert Abdomen soft, vac in place, ostomy working well  Lab Results:  No results for input(s): WBC, HGB, HCT, PLT in the last 72 hours. BMET No results for input(s): NA, K, CL, CO2, GLUCOSE, BUN, CREATININE, CALCIUM  in the last 72 hours. PT/INR No results for input(s): LABPROT, INR in the last 72 hours. ABG No results for input(s): PHART, HCO3 in the last 72 hours.  Invalid input(s): PCO2, PO2  Studies/Results: No results found.  Anti-infectives: Anti-infectives (From admission, onward)    Start     Dose/Rate Route Frequency Ordered Stop   10/14/24 2200  piperacillin -tazobactam (ZOSYN ) IVPB 3.375 g        3.375 g 12.5 mL/hr over 240 Minutes Intravenous Every 8 hours 10/14/24 1510 10/15/24 1739   10/11/24 0600  piperacillin -tazobactam (ZOSYN ) IVPB 3.375 g  Status:  Discontinued        3.375 g 12.5 mL/hr over 240 Minutes Intravenous Every 8 hours 10/10/24 2350 10/14/24 1510   10/10/24 2300  piperacillin -tazobactam (ZOSYN ) IVPB 3.375 g        3.375 g 100 mL/hr over 30 Minutes Intravenous  Once 10/10/24 2252 10/10/24 2349       Assessment/Plan: Recurrent diverticulitis  POD#9 - s/p LAP ASSISTED SIGMOID COLECTOMY W/COLOSTOMY - 10/14/2024 - Dr Tanda  -doing well -disposition planning on going  Logan Lucas 10/23/2024

## 2024-10-23 NOTE — Plan of Care (Signed)

## 2024-10-23 NOTE — Consult Note (Signed)
 WOC Nurse wound follow up Wound type: surgical abdomen Measurement: 11 cm x 3 cm x 0.5 cm Wound bed: red, moist granulation tissue throughout Drainage (amount, consistency, odor) serosanguinous in cannister  Periwound: intact Dressing procedure/placement/frequency: Removed old NPWT dressing  Filled wound with  ___1_ piece of black foam, barrier ring to umbilicus, inferior and lateral aspect of wound  Sealed NPWT dressing at HG  Patient tolerated procedure well  WOC nurse will continue to provide NPWT dressing changed due to the complexity of the dressing change.   WOC Nurse ostomy follow up Stoma type/location:  LLQ colostomy Stomal assessment/size: 1 3.8 inch round, red, moist, viable, flush with skin, os at center  Peristomal assessment: intact Treatment options for stomal/peristomal skin: 2 inch barrier ring Output brown stool Ostomy pouching: 2pc. soft convex skin barrier (lawson # [phone]) pouch (lawson #[phone]) and barrier ring (lawson # [phone])  Education provided: patient performed ostomy change during visit, able to independently cut out new skin barrier, mold and apply barrier ring, apply new ostomy pouch.  Patient reports independently emptying.cleaning pouch. Enrolled patient in [place] Secure Start Discharge program: Yes- prior visit   WOC team will continue to follow for ostomy teaching and NPWT.  Thank you,  Doyal Polite, MSN, RN, [facility] WOC Team [phone] (Available Mon-Fri 0700-1500)

## 2024-10-24 LAB — GLUCOSE, CAPILLARY
Glucose-Capillary: 179 mg/dL — ABNORMAL HIGH (ref 70–99)
Glucose-Capillary: 181 mg/dL — ABNORMAL HIGH (ref 70–99)

## 2024-10-24 MED ORDER — HYDROMORPHONE HCL 2 MG PO TABS
4.0000 mg | ORAL_TABLET | Freq: Four times a day (QID) | ORAL | Status: DC | PRN
  Administered 2024-10-24 – 2024-10-27 (×10): 4 mg via ORAL
  Filled 2024-10-24 (×10): qty 2

## 2024-10-24 NOTE — Progress Notes (Signed)
 10 Days Post-Op   Subjective/Chief Complaint: No complaints this morning   Objective: Vital signs in last 24 hours: Temp:  [98.1 F (36.7 C)-98.6 F (37 C)] 98.6 F (37 C) (11/29 0610) Pulse Rate:  [74-77] 75 (11/29 0610) Resp:  [18-20] 20 (11/29 0610) BP: (139-168)/(79-92) 152/79 (11/29 0610) SpO2:  [98 %-100 %] 100 % (11/29 0610) Last BM Date : 10/24/24  Intake/Output from previous day: 11/28 0701 - 11/29 0700 In: 840 [P.O.:840] Out: 1450 [Urine:1450] Intake/Output this shift: No intake/output data recorded.  Exam: Pt is NAD Abdomen soft, ostomy working well VAC in place  Lab Results:  No results for input(s): WBC, HGB, HCT, PLT in the last 72 hours. BMET No results for input(s): NA, K, CL, CO2, GLUCOSE, BUN, CREATININE, CALCIUM  in the last 72 hours. PT/INR No results for input(s): LABPROT, INR in the last 72 hours. ABG No results for input(s): PHART, HCO3 in the last 72 hours.  Invalid input(s): PCO2, PO2  Studies/Results: No results found.  Anti-infectives: Anti-infectives (From admission, onward)    Start     Dose/Rate Route Frequency Ordered Stop   10/14/24 2200  piperacillin -tazobactam (ZOSYN ) IVPB 3.375 g        3.375 g 12.5 mL/hr over 240 Minutes Intravenous Every 8 hours 10/14/24 1510 10/15/24 1739   10/11/24 0600  piperacillin -tazobactam (ZOSYN ) IVPB 3.375 g  Status:  Discontinued        3.375 g 12.5 mL/hr over 240 Minutes Intravenous Every 8 hours 10/10/24 2350 10/14/24 1510   10/10/24 2300  piperacillin -tazobactam (ZOSYN ) IVPB 3.375 g        3.375 g 100 mL/hr over 30 Minutes Intravenous  Once 10/10/24 2252 10/10/24 2349       Assessment/Plan: Recurrent diverticulitis  POD#10 - s/p LAP ASSISTED SIGMOID COLECTOMY W/COLOSTOMY - 10/14/2024 - Dr Tanda  -doing well with [facility] -tolerating po -ready for transfer when bed available -appreciate hosptialist's help with chronic medical issues  Vicenta Poli 10/24/2024

## 2024-10-24 NOTE — Progress Notes (Signed)
 Chart reviewed.  Blood sugars and blood pressure remained stable.  Continue CBGs with with SSI.  Rest of the management as per the primary surgical team.

## 2024-10-24 NOTE — Plan of Care (Signed)

## 2024-10-25 LAB — GLUCOSE, CAPILLARY
Glucose-Capillary: 144 mg/dL — ABNORMAL HIGH (ref 70–99)
Glucose-Capillary: 198 mg/dL — ABNORMAL HIGH (ref 70–99)
Glucose-Capillary: 156 mg/dL — ABNORMAL HIGH (ref 70–99)
Glucose-Capillary: 151 mg/dL — ABNORMAL HIGH (ref 70–99)

## 2024-10-25 MED ORDER — HYDRALAZINE HCL 25 MG PO TABS: 25.0000 mg | ORAL_TABLET | Freq: Four times a day (QID) | ORAL | Status: DC | PRN

## 2024-10-25 NOTE — Progress Notes (Signed)
 I have reviewed patient's chart.  Blood pressures are intermittently elevated.  Blood sugars have remained stable.  Will start hydralazine  as needed.  Rest of the management as per the primary surgical team.

## 2024-10-25 NOTE — Plan of Care (Signed)

## 2024-10-25 NOTE — Plan of Care (Signed)

## 2024-10-25 NOTE — Progress Notes (Signed)
 11 Days Post-Op   Subjective/Chief Complaint: No complaints Tolerating po   Objective: Vital signs in last 24 hours: Temp:  [97.9 F (36.6 C)-98.5 F (36.9 C)] 98.5 F (36.9 C) (11/30 0545) Pulse Rate:  [71-77] 71 (11/30 0545) Resp:  [20] 20 (11/30 0545) BP: (126-167)/(75-102) 126/75 (11/30 0545) SpO2:  [100 %] 100 % (11/30 0545) Last BM Date : 10/24/24  Intake/Output from previous day: 11/29 0701 - 11/30 0700 In: -  Out: 1150 [Urine:1150] Intake/Output this shift: No intake/output data recorded.  Exam: Awake and alert Comfortable Abdomen soft, VAC in place, ostomy viable and working well  Lab Results:  No results for input(s): WBC, HGB, HCT, PLT in the last 72 hours. BMET No results for input(s): NA, K, CL, CO2, GLUCOSE, BUN, CREATININE, CALCIUM  in the last 72 hours. PT/INR No results for input(s): LABPROT, INR in the last 72 hours. ABG No results for input(s): PHART, HCO3 in the last 72 hours.  Invalid input(s): PCO2, PO2  Studies/Results: No results found.  Anti-infectives: Anti-infectives (From admission, onward)    Start     Dose/Rate Route Frequency Ordered Stop   10/14/24 2200  piperacillin -tazobactam (ZOSYN ) IVPB 3.375 g        3.375 g 12.5 mL/hr over 240 Minutes Intravenous Every 8 hours 10/14/24 1510 10/15/24 1739   10/11/24 0600  piperacillin -tazobactam (ZOSYN ) IVPB 3.375 g  Status:  Discontinued        3.375 g 12.5 mL/hr over 240 Minutes Intravenous Every 8 hours 10/10/24 2350 10/14/24 1510   10/10/24 2300  piperacillin -tazobactam (ZOSYN ) IVPB 3.375 g        3.375 g 100 mL/hr over 30 Minutes Intravenous  Once 10/10/24 2252 10/10/24 2349       Assessment/Plan: Recurrent diverticulitis  POD#11 - s/p LAP ASSISTED SIGMOID COLECTOMY W/COLOSTOMY - 10/14/2024 - Dr Tanda  -doing well -VAC change tomorrow -hopefully can go to rehab tomorrow  Logan Lucas 10/25/2024

## 2024-10-26 LAB — GLUCOSE, CAPILLARY
Glucose-Capillary: 157 mg/dL — ABNORMAL HIGH (ref 70–99)
Glucose-Capillary: 245 mg/dL — ABNORMAL HIGH (ref 70–99)
Glucose-Capillary: 224 mg/dL — ABNORMAL HIGH (ref 70–99)
Glucose-Capillary: 205 mg/dL — ABNORMAL HIGH (ref 70–99)

## 2024-10-26 MED ORDER — SIMETHICONE 80 MG PO CHEW
160.0000 mg | CHEWABLE_TABLET | Freq: Four times a day (QID) | ORAL | Status: DC | PRN
  Filled 2024-10-26: qty 2

## 2024-10-26 NOTE — Consult Note (Signed)
 WOC Nurse wound follow up Wound type: surgical- abdomen Measurement: 11 cm x 3 cm x 0.5 cm Wound bed: 100% red, moist, granulation tissue, small area of darkened tissue in base of wound (see image). Surgical PA at bedside and aware Drainage (amount, consistency, odor) serosanguinous in cannister Periwound: intact Dressing procedure/placement/frequency:  Removed old NPWT dressing Filled wound with __1__ piece of black foam  barrier ring at umbilicus, inferior and lateral aspect of wound Sealed NPWT dressing at Patient tolerated procedure well  WOC nurse will continue to provide NPWT dressing changed due to the complexity of the dressing change.       WOC Nurse ostomy follow up Stoma type/location:  LLQ colostomy Stomal assessment/size: 1 3/8 inch round. Moist, red, viable, flush with skin, os at center Peristomal assessment: intact Treatment options for stomal/peristomal skin: 2 inch barrier ring Output  brown stool Ostomy pouching: 2pc. soft convex skin barrier (lawson # [id number]) pouch (lawson #[phone]) and barrier ring (lawson # [phone])  Education provided:  patient performed ostomy appliance change including cutting of skin barrier, removal of old appliance, application of barrier ring and new ostomy pouch.   Enrolled patient in [place] Secure Start Discharge program: Yes- prior visit  WOC team will continue to follow for ostomy teaching and NPWT.  Thank you,  Doyal Polite, MSN, RN, [facility] WOC Team [phone] (Available Mon-Fri 0700-1500)

## 2024-10-26 NOTE — Progress Notes (Signed)
 Patient's chart reviewed.  Blood sugars have remained mostly stable.  Blood pressures are only intermittently elevated.  Can use as needed hydralazine  if needed.  Rest of the management as per the primary surgical team.  Hospitalist team will sign off.  Please recall us  if needed.

## 2024-10-26 NOTE — Discharge Summary (Signed)
 Patient ID: Logan Lucas [id number] 08-15-1976 48 y.o.  Admit date: 10/10/2024 Discharge date: 10/27/2024  Admitting Diagnosis: Recurrent diverticulitis Chest pain and dyspnea HTN DM Asthma B adrenal nodules L kidney complex cystic lesion Tobacco abuse  Discharge Diagnosis Patient Active Problem List   Diagnosis Date Noted   Diverticulitis 10/10/2024   Diet-controlled diabetes mellitus (HCC) 10/10/2024   Diverticulitis of both large and small intestine with perforation with bleeding 08/25/2024   Acute diverticulitis 07/31/2022   Hypertriglyceridemia 06/05/2022   Anxiety 06/05/2022   Severe hyperglycemia due to diabetes mellitus (HCC) 05/24/2022   Cocaine abuse (HCC) 01/25/2021   Uncontrolled type 2 diabetes mellitus with hyperglycemia (HCC) 01/25/2021   Abnormal stress test    Alcohol use 01/23/2021   Rectal bleeding 01/23/2021   Asthma, chronic    Essential hypertension    Tobacco abuse    Duodenitis 10/14/2019   Chest pain 06/17/2018    Consultants General surgery  Reason for Admission: AMEN [name] is a 48 y.o. male with medical history significant of hypertension, diet-controlled diabetes, asthma, GI bleed, tobacco abuse, obesity (BMI 38.01).  Admitted to the hospital 9/30-10/04/2024 for acute diverticulitis complicated by possible perforation and small abscess.  This was managed conservatively with antibiotics.  Patient presents to the ED today with a chief complaint of abdominal pain.  He is endorsing severe left lower quadrant abdominal pain x 2 days associated with nausea but no episodes of vomiting.  Last bowel movement was 3 days ago. Denies fevers.  He is endorsing intermittent nonexertional left-sided chest pain and dyspnea since the onset of his abdominal pain 2 days ago which he attributes to anxiety.  Denies any active chest pain/pressure or dyspnea at this time.  He is requesting nicotine  patch as he smokes 1 pack of cigarettes daily and has  been doing so since the age of 43.  He does not take any medications at home.   ED Course: Afebrile.  Not tachycardic or hypotensive.  Labs notable for WBC count 15.1, hemoglobin 14.3, bicarb 21, glucose 158, BUN 7, creatinine 0.95, AST 16, ALT 14, alk phos 131, T. bili 0.4, lipase 23.  CT abdomen pelvis showing extensive inflammatory changes within the sigmoid mesentery with punctate extraluminal gas consistent with perforated sigmoid diverticulitis but no pericolonic abscess seen.  There is extensive mural thickening without obstruction.  EDP discussed the case with Dr. Belinda on-call for general surgery who recommended admission by hospitalist service for medical management.  General surgery will consult in the morning.  Patient was given Dilaudid , Zofran , Zosyn , and 1 L normal saline.  Procedures Dr. Tanda, 10/14/2024 LAPAROSCOPIC ASSISTED HARTMANN'S PROCEDURE (SIGMOID COLECTOMY WITH END COLOSTOMY) AND LAPAROSCOPIC SPLENIC FLEXURE MOBILIZATION   Hospital Course:  The patient was admitted by the medical service with a surgery consultation.  He did not improve with conservative management for his diverticulitis given persistent pain.  Patient was agreeable to proceed with the above procedure.  He did have a wound VAC placed post operatively.  WOC was consulted for assistance with his new colostomy as well as VAC management.  These remained stable throughout his stay.  His diet was able to be advanced as tolerated.  His colostomy bag was functioning.  Due to some social issues, SNF placement was recommended.  This took many days to arrange.  He remained surgically stable during this time working on pain control and therapy until a bed was obtained.    The hospitalist service followed the patient to assist in  management of his DM, HTN, and other medical problems.  These remained stable.  The patient was stable for DC to SNF on POD 13. Staples removed prior to D/C and steri-strips placed.   Physical  Exam: Gen; alert, NAD CV: RRR, no lower extremity edema Pulm: normal effort ORA Abd: soft, non-distended, non-tender, incisions c/d/I - staples. Stoma viable. Ostomy with gas and non-bloody soft stool in pouch.  Allergies as of 10/27/2024   No Active Allergies      Medication List     STOP taking these medications    ondansetron  4 MG tablet Commonly known as: ZOFRAN        TAKE these medications    Accu-Chek Guide Test test strip Generic drug: glucose blood Use to test  blood sugar in the morning, at noon, and at bedtime.   Accu-Chek Guide w/Device Kit Use to test blood sugar in the morning, at noon, and at bedtime.   acetaminophen  500 MG tablet Commonly known as: TYLENOL  Take 2 tablets (1,000 mg total) by mouth every 6 (six) hours.   insulin  aspart 100 UNIT/ML injection Commonly known as: novoLOG  Inject 0-15 Units into the skin 3 (three) times daily with meals.   insulin  aspart 100 UNIT/ML injection Commonly known as: novoLOG  Inject 0-5 Units into the skin at bedtime.   lip balm ointment Apply topically as needed.   melatonin 3 MG Tabs tablet Take 1 tablet (3 mg total) by mouth at bedtime.   methocarbamol  500 MG tablet Commonly known as: ROBAXIN  Take 1 tablet (500 mg total) by mouth every 6 (six) hours as needed for muscle spasms.   nicotine  21 mg/24hr patch Commonly known as: NICODERM CQ  - dosed in mg/24 hours Place 1 patch (21 mg total) onto the skin daily. Start taking on: October 28, 2024   Oxycodone  HCl 10 MG Tabs Take 1 tablet (10 mg total) by mouth every 6 (six) hours as needed for severe pain (pain score 7-10).   polyethylene glycol 17 g packet Commonly known as: MIRALAX  / GLYCOLAX  Take 17 g by mouth daily. Start taking on: October 28, 2024   simethicone  80 MG chewable tablet Commonly known as: MYLICON Chew 2 tablets (160 mg total) by mouth every 6 (six) hours as needed for flatulence.          Follow-up Information     Tanda Locus,  MD Follow up on 11/04/2024.   Specialty: General Surgery Why: At 11:15AM, Arrive 30 mins prior to scheduled appointment time, Please bring your insurance cards and ID. Contact information: [address] [place] [address] [phone]                 Signed: Almarie Pringle, [facility] 10/27/2024, 9:35 AM Please see Amion for pager number during day hours 7:00am-4:30pm, 7-11:30am on Weekends

## 2024-10-26 NOTE — Plan of Care (Signed)

## 2024-10-26 NOTE — Progress Notes (Signed)
 12 Days Post-Op   Subjective/Chief Complaint: Doing well NO issues   Objective: Vital signs in last 24 hours: Temp:  [98.4 F (36.9 C)-98.9 F (37.2 C)] 98.9 F (37.2 C) (12/01 0553) Pulse Rate:  [70-81] 70 (12/01 0553) Resp:  [15-20] 15 (12/01 0553) BP: (124-148)/(81-95) 124/81 (12/01 0553) SpO2:  [99 %] 99 % (12/01 0553) Last BM Date : 10/25/24  Intake/Output from previous day: 11/30 0701 - 12/01 0700 In: 440 [P.O.:440] Out: 2325 [Urine:2025; Drains:300] Intake/Output this shift: No intake/output data recorded.  General appearance: alert and cooperative GI: soft, non-tender; bowel sounds normal; no masses,  no organomegaly and Vac in place  Lab Results:  No results for input(s): WBC, HGB, HCT, PLT in the last 72 hours. BMET No results for input(s): NA, K, CL, CO2, GLUCOSE, BUN, CREATININE, CALCIUM  in the last 72 hours. PT/INR No results for input(s): LABPROT, INR in the last 72 hours. ABG No results for input(s): PHART, HCO3 in the last 72 hours.  Invalid input(s): PCO2, PO2  Studies/Results: No results found.  Anti-infectives: Anti-infectives (From admission, onward)    Start     Dose/Rate Route Frequency Ordered Stop   10/14/24 2200  piperacillin -tazobactam (ZOSYN ) IVPB 3.375 g        3.375 g 12.5 mL/hr over 240 Minutes Intravenous Every 8 hours 10/14/24 1510 10/15/24 1739   10/11/24 0600  piperacillin -tazobactam (ZOSYN ) IVPB 3.375 g  Status:  Discontinued        3.375 g 12.5 mL/hr over 240 Minutes Intravenous Every 8 hours 10/10/24 2350 10/14/24 1510   10/10/24 2300  piperacillin -tazobactam (ZOSYN ) IVPB 3.375 g        3.375 g 100 mL/hr over 30 Minutes Intravenous  Once 10/10/24 2252 10/10/24 2349       Assessment/Plan: Recurrent diverticulitis  POD#12 - s/p LAP ASSISTED SIGMOID COLECTOMY W/COLOSTOMY - 10/14/2024 - Dr Tanda   -doing well -VAC change today - rehab approval pending  LOS: 16 days    Logan Lucas 10/26/2024

## 2024-10-26 NOTE — TOC Progression Note (Signed)
 Transition of Care [facility]) - Progression Note   Patient Details  Name: Logan Lucas MRN: [id number] Date of Birth: Jul 27, 1976  [facility]) CM/SW Contact  Duwaine GORMAN Aran, LCSW Phone Number: 10/26/2024, 3:22 PM  Clinical Narrative: CSW notified by Brittany with [name] that patient is approved for SNF. CSW messaged general surgery to see if patient is ready for rehab. Care management awaiting update.  Expected Discharge Plan: Skilled Nursing Facility Barriers to Discharge: Continued Medical Work up  Expected Discharge Plan and Services Discharge Planning Services: CM Consult Living arrangements for the past 2 months: Single Family Home  Social Drivers of Health (SDOH) Interventions SDOH Screenings   Food Insecurity: No Food Insecurity (10/11/2024)  Housing: Low Risk  (10/11/2024)  Transportation Needs: No Transportation Needs (10/11/2024)  Utilities: Not At Risk (10/11/2024)  Depression (PHQ2-9): Low Risk  (06/05/2022)  Tobacco Use: High Risk (10/14/2024)   Readmission Risk Interventions    08/03/2022    4:22 PM  Readmission Risk Prevention Plan  Post Dischage Appt Complete  Medication Screening Complete  Transportation Screening Complete

## 2024-10-27 LAB — GLUCOSE, CAPILLARY: Glucose-Capillary: 146 mg/dL — ABNORMAL HIGH (ref 70–99)

## 2024-10-27 MED ORDER — CARMEX CLASSIC LIP BALM EX OINT: TOPICAL_OINTMENT | CUTANEOUS | Status: AC | PRN

## 2024-10-27 MED ORDER — METHOCARBAMOL 500 MG PO TABS: 500.0000 mg | ORAL_TABLET | Freq: Four times a day (QID) | ORAL | Status: AC | PRN

## 2024-10-27 MED ORDER — INSULIN ASPART 100 UNIT/ML IJ SOLN: 0.0000 [IU] | Freq: Every day | INTRAMUSCULAR | Status: AC

## 2024-10-27 MED ORDER — INSULIN ASPART 100 UNIT/ML IJ SOLN: 0.0000 [IU] | Freq: Three times a day (TID) | INTRAMUSCULAR | Status: AC

## 2024-10-27 MED ORDER — MELATONIN 3 MG PO TABS: 3.0000 mg | ORAL_TABLET | Freq: Every day | ORAL | Status: AC

## 2024-10-27 MED ORDER — ACETAMINOPHEN 500 MG PO TABS: 1000.0000 mg | ORAL_TABLET | Freq: Four times a day (QID) | ORAL | Status: AC

## 2024-10-27 MED ORDER — NICOTINE 21 MG/24HR TD PT24: 21.0000 mg | MEDICATED_PATCH | Freq: Every day | TRANSDERMAL | Status: AC

## 2024-10-27 MED ORDER — POLYETHYLENE GLYCOL 3350 17 G PO PACK: 17.0000 g | PACK | Freq: Every day | ORAL | Status: AC

## 2024-10-27 MED ORDER — SIMETHICONE 80 MG PO CHEW: 160.0000 mg | CHEWABLE_TABLET | Freq: Four times a day (QID) | ORAL | Status: AC | PRN

## 2024-10-27 MED ORDER — OXYCODONE HCL 10 MG PO TABS: 10.0000 mg | ORAL_TABLET | Freq: Four times a day (QID) | ORAL | 0 refills | Status: AC | PRN

## 2024-10-27 NOTE — Progress Notes (Signed)
 RN to call report to [employer].   10:35: RN attempted to call report.   10:45 Report called to [facility].

## 2024-10-27 NOTE — TOC Transition Note (Signed)
 Transition of Care [facility]) - Discharge Note  Patient Details  Name: Logan Lucas MRN: [id number] Date of Birth: [birth year]  Transition of Care [facility]) CM/SW Contact:  Duwaine GORMAN Aran, LCSW Phone Number: 10/27/2024, 10:44 AM  Clinical Narrative: Patient will discharge today to [facility]. Discharge paperwork sent in hub per Brittany in admissions request. Patient will go to room 407A and the number for report is [id number]. Medical necessity form done; PTAR scheduled. Discharge packet completed. Patient notified of transport and that SNF is a non-smoking facility. RN updated. Care management signing off.  Final next level of care: Skilled Nursing Facility Barriers to Discharge: Barriers Resolved  Patient Goals and CMS Choice Patient states their goals for this hospitalization and ongoing recovery are:: Rehab CMS Medicare.gov Compare Post Acute Care list provided to:: Patient Choice offered to / list presented to : Patient [place] ownership interest in [facility].provided to:: Patient   Discharge Placement PASRR number recieved: 10/19/24      Patient chooses bed at: Other - please specify in the comment section below: [facility]) Patient to be transferred to facility by: PTAR Patient and family notified of of transfer: 10/27/24  Discharge Plan and Services Additional resources added to the After Visit Summary for   Discharge Planning Services: CM Consult      DME Arranged: N/A DME Agency: NA  Social Drivers of Health (SDOH) Interventions SDOH Screenings   Food Insecurity: No Food Insecurity (10/11/2024)  Housing: Low Risk  (10/11/2024)  Transportation Needs: No Transportation Needs (10/11/2024)  Utilities: Not At Risk (10/11/2024)  Depression (PHQ2-9): Low Risk  (06/05/2022)  Tobacco Use: High Risk (10/14/2024)   Readmission Risk Interventions    08/03/2022    4:22 PM   Readmission Risk Prevention Plan  Post Dischage Appt Complete  Medication Screening Complete  Transportation Screening Complete

## 2024-10-27 NOTE — Progress Notes (Signed)
 Staples removed per Surgery. Steri strips in place.

## 2024-10-27 NOTE — Plan of Care (Signed)

## 2024-11-07 ENCOUNTER — Encounter: Payer: Self-pay | Admitting: Internal Medicine

## 2024-11-09 ENCOUNTER — Telehealth: Payer: Self-pay

## 2024-11-09 ENCOUNTER — Ambulatory Visit

## 2024-11-09 VITALS — Ht 69.0 in | Wt 255.0 lb

## 2024-11-09 DIAGNOSIS — Z1211 Encounter for screening for malignant neoplasm of colon: Secondary | ICD-10-CM

## 2024-11-09 MED ORDER — NA SULFATE-K SULFATE-MG SULF 17.5-3.13-1.6 GM/177ML PO SOLN: 1.0000 | Freq: Once | ORAL | 0 refills | Status: AC

## 2024-11-09 NOTE — Telephone Encounter (Signed)
 Completed Pre Visit.  Patient verbally understands prep instructions.

## 2024-11-09 NOTE — Telephone Encounter (Signed)
 Patient had partial Colectomy on 10/14/24 and will not be S/P until 11/25/24.  Patient has a pre visit today and is scheduled for screening colonoscopy 11/30/24.

## 2024-11-09 NOTE — Telephone Encounter (Signed)
 Spoke with patient and let him know that he would receive a call from pre visit, after Dr.Gessner decided if he needed to be seen in the office or for a previsit.

## 2024-11-09 NOTE — Telephone Encounter (Signed)
 He does not need an office visit.  Surgery sends these people over for colonoscopy prior to colostomy takedown very frequently.  From my perspective as long as they are healthy otherwise and do not have other issues that would need an office visit they can be scheduled directly.  Thanks for asking.

## 2024-11-09 NOTE — Progress Notes (Signed)

## 2024-11-13 ENCOUNTER — Ambulatory Visit (HOSPITAL_COMMUNITY)
Admission: RE | Admit: 2024-11-13 | Discharge: 2024-11-13 | Disposition: A | Discharge status: Home / Self Care | Source: Ambulatory Visit | Attending: Nurse Practitioner | Admitting: Nurse Practitioner

## 2024-11-13 DIAGNOSIS — Z794 Long term (current) use of insulin: Secondary | ICD-10-CM | POA: Diagnosis not present

## 2024-11-13 DIAGNOSIS — K5780 Diverticulitis of intestine, part unspecified, with perforation and abscess without bleeding: Secondary | ICD-10-CM | POA: Diagnosis present

## 2024-11-13 DIAGNOSIS — Z933 Colostomy status: Secondary | ICD-10-CM | POA: Insufficient documentation

## 2024-11-13 NOTE — Progress Notes (Signed)
 [facility]   Reason for visit:  LLQ colostomy, flush  HPI:  Diverticulitis with perforation, colostomy  Past Medical History:  Diagnosis Date   Anxiety    Asthma    Diabetes mellitus without complication (HCC)    GI bleed    Hypertension    Family History  Problem Relation Age of Onset   Diabetes Mother    Cancer Father    Colon cancer Neg Hx    Colon polyps Neg Hx    Esophageal cancer Neg Hx    Rectal cancer Neg Hx    Stomach cancer Neg Hx    Allergies[1] Current Outpatient Medications  Medication Sig Dispense Refill Last Dose/Taking   acetaminophen  (TYLENOL ) 500 MG tablet Take 2 tablets (1,000 mg total) by mouth every 6 (six) hours. (Patient not taking: Reported on 11/09/2024)      Blood Glucose Monitoring Suppl (BLOOD GLUCOSE MONITOR SYSTEM) w/Device KIT Use to test blood sugar in the morning, at noon, and at bedtime. 1 kit 0    Glucose Blood (BLOOD GLUCOSE TEST STRIPS) STRP Use to test  blood sugar in the morning, at noon, and at bedtime. 100 strip 0    insulin  aspart (NOVOLOG ) 100 UNIT/ML injection Inject 0-15 Units into the skin 3 (three) times daily with meals. (Patient not taking: Reported on 11/09/2024)      insulin  aspart (NOVOLOG ) 100 UNIT/ML injection Inject 0-5 Units into the skin at bedtime. (Patient not taking: Reported on 11/09/2024)      lip balm (CARMEX) ointment Apply topically as needed.      melatonin 3 MG TABS tablet Take 1 tablet (3 mg total) by mouth at bedtime.      methocarbamol  (ROBAXIN ) 500 MG tablet Take 1 tablet (500 mg total) by mouth every 6 (six) hours as needed for muscle spasms.      nicotine  (NICODERM CQ  - DOSED IN MG/24 HOURS) 21 mg/24hr patch Place 1 patch (21 mg total) onto the skin daily. (Patient not taking: Reported on 11/09/2024)      oxyCODONE  10 MG TABS Take 1 tablet (10 mg total) by mouth every 6 (six) hours as needed for severe pain (pain score 7-10). 20 tablet 0    polyethylene glycol (MIRALAX  / GLYCOLAX ) 17 g packet  Take 17 g by mouth daily. (Patient not taking: Reported on 11/09/2024)      simethicone  (MYLICON) 80 MG chewable tablet Chew 2 tablets (160 mg total) by mouth every 6 (six) hours as needed for flatulence. (Patient not taking: Reported on 11/09/2024)      No current facility-administered medications for this encounter.   ROS  Review of Systems  Constitutional:  Positive for fatigue.  Gastrointestinal:        LLQ colostomy Hx diverticulitis  Skin:  Positive for rash.       Peristomal irritation  Psychiatric/Behavioral: Negative.    All other systems reviewed and are negative.  Vital signs:  BP (!) 139/93   Pulse 89   Temp 97.9 F (36.6 C) (Oral)   Resp 18   SpO2 96%  Exam:  Physical Exam Vitals reviewed.  Constitutional:      Appearance: Normal appearance.  HENT:     Mouth/Throat:     Mouth: Mucous membranes are moist.  Cardiovascular:     Rate and Rhythm: Normal rate.  Pulmonary:     Effort: Pulmonary effort is normal.  Abdominal:     Palpations: Abdomen is soft.  Musculoskeletal:  General: Normal range of motion.  Skin:    General: Skin is warm and dry.  Neurological:     Mental Status: He is alert and oriented to person, place, and time. Mental status is at baseline.  Psychiatric:        Mood and Affect: Mood normal.        Behavior: Behavior normal.     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 Convex 2 3/4   Education provided:  Pouch change performed. Recommend filtered pouch to allow gas to pass through  He is in agreement with this.  Discussed using powder and skin prep when irritated.  Barrier ring and convex pouch to promote seal  andbelt for added security .     Impression/dx  Colostomy  Discussion  Risk for hernia as activities, avoid heavy lifting, pushing and pulling.  Plan  Will set up with [facility] for supplies.   I personally spent a total of 55 minutes in the care of the patient today including preparing to see the patient, getting/reviewing separately obtained history, performing a medically appropriate exam/evaluation, counseling and educating, placing orders, referring and communicating with other health care professionals, documenting clinical information in the EHR, independently interpreting results, communicating results, coordinating care, and pouch change and ostomy education. .    Visit time: 55 minutes.   Darice Cooley FNP-BC        [1] No Active Allergies

## 2024-11-13 NOTE — Discharge Instructions (Addendum)
 2 piece 2 3/4 filtered beige pouch  Convex barrier Barrier ring Ostomy belt Powder Skin prep Set up with [facility]

## 2024-11-24 ENCOUNTER — Other Ambulatory Visit (HOSPITAL_COMMUNITY): Payer: Self-pay | Admitting: Nurse Practitioner

## 2024-11-24 ENCOUNTER — Telehealth: Payer: Self-pay | Admitting: Internal Medicine

## 2024-11-24 DIAGNOSIS — K9400 Colostomy complication, unspecified: Secondary | ICD-10-CM | POA: Insufficient documentation

## 2024-11-24 DIAGNOSIS — L24B3 Irritant contact dermatitis related to fecal or urinary stoma or fistula: Secondary | ICD-10-CM | POA: Insufficient documentation

## 2024-11-24 DIAGNOSIS — Z433 Encounter for attention to colostomy: Secondary | ICD-10-CM

## 2024-11-24 NOTE — Telephone Encounter (Signed)
 Good afternoon Dr. Avram,   I received a call from this patient stating that he is needing to cancel his procedure on January the 5th due to him not being able to take off work. Patient stated that he is going to call back to reschedule his appointment once he can find a good time his care partner is able to bring him. Please advise.    Thank you.

## 2024-11-24 NOTE — Telephone Encounter (Signed)
OK No charge 

## 2024-11-30 ENCOUNTER — Encounter

## 2024-11-30 ENCOUNTER — Encounter: Admitting: Internal Medicine
